# Patient Record
Sex: Female | Born: 1960
Health system: Southern US, Community
[De-identification: ages and names within clinical notes are randomized; demographics above are authoritative.]

## PROBLEM LIST (undated history)

## (undated) DIAGNOSIS — E559 Vitamin D deficiency, unspecified: Secondary | ICD-10-CM

## (undated) DIAGNOSIS — K648 Other hemorrhoids: Secondary | ICD-10-CM

## (undated) DIAGNOSIS — M722 Plantar fascial fibromatosis: Secondary | ICD-10-CM

## (undated) DIAGNOSIS — M889 Osteitis deformans of unspecified bone: Secondary | ICD-10-CM

## (undated) DIAGNOSIS — G8929 Other chronic pain: Secondary | ICD-10-CM

## (undated) DIAGNOSIS — G4730 Sleep apnea, unspecified: Secondary | ICD-10-CM

## (undated) DIAGNOSIS — I519 Heart disease, unspecified: Secondary | ICD-10-CM

## (undated) DIAGNOSIS — R7303 Prediabetes: Secondary | ICD-10-CM

## (undated) DIAGNOSIS — E739 Lactose intolerance, unspecified: Secondary | ICD-10-CM

## (undated) DIAGNOSIS — F32A Depression, unspecified: Secondary | ICD-10-CM

## (undated) DIAGNOSIS — K219 Gastro-esophageal reflux disease without esophagitis: Secondary | ICD-10-CM

## (undated) DIAGNOSIS — M25562 Pain in left knee: Secondary | ICD-10-CM

## (undated) DIAGNOSIS — M549 Dorsalgia, unspecified: Secondary | ICD-10-CM

## (undated) DIAGNOSIS — I10 Essential (primary) hypertension: Secondary | ICD-10-CM

## (undated) DIAGNOSIS — Z860100 Personal history of colon polyps, unspecified: Secondary | ICD-10-CM

## (undated) DIAGNOSIS — M5030 Other cervical disc degeneration, unspecified cervical region: Secondary | ICD-10-CM

## (undated) DIAGNOSIS — M329 Systemic lupus erythematosus, unspecified: Secondary | ICD-10-CM

## (undated) DIAGNOSIS — A6000 Herpesviral infection of urogenital system, unspecified: Secondary | ICD-10-CM

## (undated) DIAGNOSIS — M170 Bilateral primary osteoarthritis of knee: Secondary | ICD-10-CM

## (undated) DIAGNOSIS — D128 Benign neoplasm of rectum: Secondary | ICD-10-CM

## (undated) DIAGNOSIS — E039 Hypothyroidism, unspecified: Secondary | ICD-10-CM

## (undated) DIAGNOSIS — I517 Cardiomegaly: Secondary | ICD-10-CM

## (undated) DIAGNOSIS — D696 Thrombocytopenia, unspecified: Secondary | ICD-10-CM

## (undated) DIAGNOSIS — T7840XA Allergy, unspecified, initial encounter: Secondary | ICD-10-CM

## (undated) DIAGNOSIS — IMO0002 Reserved for concepts with insufficient information to code with codable children: Secondary | ICD-10-CM

## (undated) DIAGNOSIS — J45909 Unspecified asthma, uncomplicated: Secondary | ICD-10-CM

## (undated) DIAGNOSIS — M25561 Pain in right knee: Secondary | ICD-10-CM

## (undated) DIAGNOSIS — F329 Major depressive disorder, single episode, unspecified: Secondary | ICD-10-CM

## (undated) DIAGNOSIS — I071 Rheumatic tricuspid insufficiency: Secondary | ICD-10-CM

## (undated) DIAGNOSIS — R011 Cardiac murmur, unspecified: Secondary | ICD-10-CM

## (undated) DIAGNOSIS — E785 Hyperlipidemia, unspecified: Secondary | ICD-10-CM

## (undated) DIAGNOSIS — D259 Leiomyoma of uterus, unspecified: Secondary | ICD-10-CM

## (undated) DIAGNOSIS — G4700 Insomnia, unspecified: Secondary | ICD-10-CM

## (undated) DIAGNOSIS — K5900 Constipation, unspecified: Secondary | ICD-10-CM

## (undated) DIAGNOSIS — N950 Postmenopausal bleeding: Secondary | ICD-10-CM

## (undated) HISTORY — DX: Insomnia, unspecified: G47.00

## (undated) HISTORY — PX: TOE SURGERY: SHX1073

## (undated) HISTORY — DX: Unspecified asthma, uncomplicated: J45.909

## (undated) HISTORY — DX: Vitamin D deficiency, unspecified: E55.9

## (undated) HISTORY — DX: Major depressive disorder, single episode, unspecified: F32.9

## (undated) HISTORY — PX: CHOLECYSTECTOMY: SHX55

## (undated) HISTORY — DX: Thrombocytopenia, unspecified: D69.6

## (undated) HISTORY — DX: Essential (primary) hypertension: I10

## (undated) HISTORY — DX: Sleep apnea, unspecified: G47.30

## (undated) HISTORY — DX: Reserved for concepts with insufficient information to code with codable children: IMO0002

## (undated) HISTORY — DX: Benign neoplasm of rectum: D12.8

## (undated) HISTORY — DX: Osteitis deformans of unspecified bone: M88.9

## (undated) HISTORY — PX: REDUCTION MAMMAPLASTY: SUR839

## (undated) HISTORY — DX: Allergy, unspecified, initial encounter: T78.40XA

## (undated) HISTORY — DX: Lactose intolerance, unspecified: E73.9

## (undated) HISTORY — DX: Cardiac murmur, unspecified: R01.1

## (undated) HISTORY — DX: Systemic lupus erythematosus, unspecified: M32.9

## (undated) HISTORY — DX: Bilateral primary osteoarthritis of knee: M17.0

## (undated) HISTORY — PX: POLYPECTOMY: SHX149

## (undated) HISTORY — PX: BREAST ENHANCEMENT SURGERY: SHX7

## (undated) HISTORY — DX: Hyperlipidemia, unspecified: E78.5

## (undated) HISTORY — DX: Dorsalgia, unspecified: M54.9

## (undated) HISTORY — DX: Morbid (severe) obesity due to excess calories: E66.01

## (undated) HISTORY — DX: Hypothyroidism, unspecified: E03.9

## (undated) HISTORY — DX: Herpesviral infection of urogenital system, unspecified: A60.00

## (undated) HISTORY — DX: Depression, unspecified: F32.A

---

## 1989-07-11 HISTORY — PX: TUBAL LIGATION: SHX77

## 1997-07-11 HISTORY — PX: BREAST REDUCTION SURGERY: SHX8

## 1997-12-04 ENCOUNTER — Ambulatory Visit (HOSPITAL_COMMUNITY): Admission: RE | Admit: 1997-12-04 | Discharge: 1997-12-04 | Payer: Self-pay | Admitting: Family Medicine

## 1999-01-04 ENCOUNTER — Other Ambulatory Visit: Admission: RE | Admit: 1999-01-04 | Discharge: 1999-01-04 | Payer: Self-pay | Admitting: Internal Medicine

## 1999-03-25 ENCOUNTER — Ambulatory Visit (HOSPITAL_COMMUNITY): Admission: RE | Admit: 1999-03-25 | Discharge: 1999-03-25 | Payer: Self-pay | Admitting: Gastroenterology

## 1999-03-25 ENCOUNTER — Encounter: Payer: Self-pay | Admitting: Gastroenterology

## 1999-07-12 HISTORY — PX: AUGMENTATION MAMMAPLASTY: SUR837

## 2001-05-25 ENCOUNTER — Other Ambulatory Visit: Admission: RE | Admit: 2001-05-25 | Discharge: 2001-05-25 | Payer: Self-pay | Admitting: Internal Medicine

## 2001-05-30 ENCOUNTER — Encounter: Payer: Self-pay | Admitting: Internal Medicine

## 2001-05-30 ENCOUNTER — Encounter: Admission: RE | Admit: 2001-05-30 | Discharge: 2001-05-30 | Payer: Self-pay | Admitting: Internal Medicine

## 2002-03-28 ENCOUNTER — Emergency Department (HOSPITAL_COMMUNITY): Admission: EM | Admit: 2002-03-28 | Discharge: 2002-03-28 | Payer: Self-pay | Admitting: Emergency Medicine

## 2002-08-20 ENCOUNTER — Encounter (HOSPITAL_BASED_OUTPATIENT_CLINIC_OR_DEPARTMENT_OTHER): Admission: RE | Admit: 2002-08-20 | Discharge: 2002-11-18 | Payer: Self-pay | Admitting: Internal Medicine

## 2004-06-18 ENCOUNTER — Encounter: Admission: RE | Admit: 2004-06-18 | Discharge: 2004-06-18 | Payer: Self-pay | Admitting: Internal Medicine

## 2004-09-29 ENCOUNTER — Encounter: Admission: RE | Admit: 2004-09-29 | Discharge: 2004-09-29 | Payer: Self-pay | Admitting: Internal Medicine

## 2005-05-03 ENCOUNTER — Encounter: Admission: RE | Admit: 2005-05-03 | Discharge: 2005-05-03 | Payer: Self-pay | Admitting: Internal Medicine

## 2005-07-19 ENCOUNTER — Encounter: Admission: RE | Admit: 2005-07-19 | Discharge: 2005-07-19 | Payer: Self-pay | Admitting: Internal Medicine

## 2005-11-19 ENCOUNTER — Emergency Department (HOSPITAL_COMMUNITY): Admission: EM | Admit: 2005-11-19 | Discharge: 2005-11-19 | Payer: Self-pay | Admitting: Family Medicine

## 2006-07-11 DIAGNOSIS — M329 Systemic lupus erythematosus, unspecified: Secondary | ICD-10-CM

## 2006-07-11 DIAGNOSIS — IMO0002 Reserved for concepts with insufficient information to code with codable children: Secondary | ICD-10-CM

## 2006-07-11 HISTORY — DX: Systemic lupus erythematosus, unspecified: M32.9

## 2006-07-11 HISTORY — DX: Reserved for concepts with insufficient information to code with codable children: IMO0002

## 2006-07-20 ENCOUNTER — Encounter: Admission: RE | Admit: 2006-07-20 | Discharge: 2006-07-20 | Payer: Self-pay

## 2006-08-22 ENCOUNTER — Other Ambulatory Visit: Admission: RE | Admit: 2006-08-22 | Discharge: 2006-08-22 | Payer: Self-pay | Admitting: Family Medicine

## 2007-08-28 ENCOUNTER — Encounter: Admission: RE | Admit: 2007-08-28 | Discharge: 2007-08-28 | Payer: Self-pay | Admitting: Family Medicine

## 2007-10-03 ENCOUNTER — Other Ambulatory Visit: Admission: RE | Admit: 2007-10-03 | Discharge: 2007-10-03 | Payer: Self-pay | Admitting: Family Medicine

## 2007-10-12 ENCOUNTER — Ambulatory Visit (HOSPITAL_COMMUNITY): Admission: RE | Admit: 2007-10-12 | Discharge: 2007-10-12 | Payer: Self-pay | Admitting: Family Medicine

## 2008-09-16 ENCOUNTER — Encounter: Admission: RE | Admit: 2008-09-16 | Discharge: 2008-09-16 | Payer: Self-pay | Admitting: Family Medicine

## 2008-10-15 ENCOUNTER — Other Ambulatory Visit: Admission: RE | Admit: 2008-10-15 | Discharge: 2008-10-15 | Payer: Self-pay | Admitting: Family Medicine

## 2009-09-18 ENCOUNTER — Encounter: Admission: RE | Admit: 2009-09-18 | Discharge: 2009-09-18 | Payer: Self-pay | Admitting: Family Medicine

## 2009-10-19 ENCOUNTER — Emergency Department (HOSPITAL_COMMUNITY): Admission: EM | Admit: 2009-10-19 | Discharge: 2009-10-19 | Payer: Self-pay | Admitting: Family Medicine

## 2009-11-20 ENCOUNTER — Other Ambulatory Visit: Admission: RE | Admit: 2009-11-20 | Discharge: 2009-11-20 | Payer: Self-pay | Admitting: Family Medicine

## 2010-02-01 ENCOUNTER — Ambulatory Visit (HOSPITAL_COMMUNITY): Admission: RE | Admit: 2010-02-01 | Discharge: 2010-02-01 | Payer: Self-pay | Admitting: Rheumatology

## 2010-02-15 ENCOUNTER — Ambulatory Visit (HOSPITAL_COMMUNITY): Admission: RE | Admit: 2010-02-15 | Discharge: 2010-02-15 | Payer: Self-pay | Admitting: Unknown Physician Specialty

## 2010-02-16 ENCOUNTER — Emergency Department (HOSPITAL_COMMUNITY): Admission: EM | Admit: 2010-02-16 | Discharge: 2010-02-16 | Payer: Self-pay | Admitting: Emergency Medicine

## 2010-06-16 ENCOUNTER — Ambulatory Visit (HOSPITAL_COMMUNITY)
Admission: RE | Admit: 2010-06-16 | Discharge: 2010-06-16 | Payer: Self-pay | Source: Home / Self Care | Attending: Rheumatology | Admitting: Rheumatology

## 2010-08-01 ENCOUNTER — Encounter: Payer: Self-pay | Admitting: Family Medicine

## 2010-09-24 LAB — DIFFERENTIAL
Eosinophils Absolute: 0 10*3/uL (ref 0.0–0.7)
Lymphocytes Relative: 4 % — ABNORMAL LOW (ref 12–46)
Lymphs Abs: 0.3 10*3/uL — ABNORMAL LOW (ref 0.7–4.0)
Neutro Abs: 7 10*3/uL (ref 1.7–7.7)
Neutrophils Relative %: 94 % — ABNORMAL HIGH (ref 43–77)

## 2010-09-24 LAB — POCT I-STAT, CHEM 8
Calcium, Ion: 0.98 mmol/L — ABNORMAL LOW (ref 1.12–1.32)
Creatinine, Ser: 0.8 mg/dL (ref 0.4–1.2)
Glucose, Bld: 121 mg/dL — ABNORMAL HIGH (ref 70–99)
HCT: 41 % (ref 36.0–46.0)
Hemoglobin: 13.9 g/dL (ref 12.0–15.0)
Potassium: 3.6 mEq/L (ref 3.5–5.1)
Sodium: 137 mEq/L (ref 135–145)
TCO2: 22 mmol/L (ref 0–100)

## 2010-09-24 LAB — CBC
HCT: 37.8 % (ref 36.0–46.0)
MCH: 28.5 pg (ref 26.0–34.0)
MCHC: 33.3 g/dL (ref 30.0–36.0)
RBC: 4.42 MIL/uL (ref 3.87–5.11)
RDW: 13.5 % (ref 11.5–15.5)
WBC: 7.4 10*3/uL (ref 4.0–10.5)

## 2010-09-24 LAB — URINALYSIS, ROUTINE W REFLEX MICROSCOPIC
Bilirubin Urine: NEGATIVE
Glucose, UA: NEGATIVE mg/dL
Nitrite: NEGATIVE
Protein, ur: NEGATIVE mg/dL
Urobilinogen, UA: 1 mg/dL (ref 0.0–1.0)
pH: 7 (ref 5.0–8.0)

## 2010-09-24 LAB — BASIC METABOLIC PANEL
CO2: 22 mEq/L (ref 19–32)
Calcium: 8.4 mg/dL (ref 8.4–10.5)
Creatinine, Ser: 0.77 mg/dL (ref 0.4–1.2)
GFR calc Af Amer: >60 mL/min (ref >60)
Sodium: 134 mEq/L — ABNORMAL LOW (ref 135–145)

## 2010-09-24 LAB — URINE MICROSCOPIC-ADD ON

## 2010-10-01 ENCOUNTER — Other Ambulatory Visit: Payer: Self-pay | Admitting: Family Medicine

## 2010-10-01 DIAGNOSIS — Z1231 Encounter for screening mammogram for malignant neoplasm of breast: Secondary | ICD-10-CM

## 2010-10-12 ENCOUNTER — Ambulatory Visit
Admission: RE | Admit: 2010-10-12 | Discharge: 2010-10-12 | Disposition: A | Discharge status: Home / Self Care | Payer: Commercial Managed Care - PPO | Source: Ambulatory Visit | Attending: Family Medicine | Admitting: Family Medicine

## 2010-10-12 DIAGNOSIS — Z1231 Encounter for screening mammogram for malignant neoplasm of breast: Secondary | ICD-10-CM

## 2010-11-26 NOTE — Consult Note (Signed)
NAME:  Michelle Huerta, Michelle Huerta                            ACCOUNT NO.:  [id number]   MEDICAL RECORD NO.:  [id number]                   PATIENT TYPE:  REC   LOCATION:  FOOT                                 FACILITY:  [facility]   PHYSICIAN:  Jonelle Sports. Sevier, M.D.              DATE OF BIRTH:  Mar 15, 1961   DATE OF CONSULTATION:  08/22/2002  DATE OF DISCHARGE:                                   CONSULTATION   HISTORY:  This 50 year old black female comes self-referred for evaluation  of pain in the right fifth toe.  The patient has a history of hammertoe  deformities and had surgery, which appears to have consisted of removal of  the proximal phalanx bilaterally on the fifth toes some five or six years  ago by Dr. Tomie China.  She has had very little trouble since that time,  until recently, when she noticed significant pain in the toe on the right,  this pain being dorsolateral in location.  She has felt some limitation of  mobility because of the pain but has had no difficulties with any other  portion of her foot.  She is here now for our evaluation and advice.   PAST MEDICAL HISTORY:  Positive only for hypertension.   ALLERGIES:  PENICILLIN.   MEDICATIONS:  She takes an unknown antihypertensive medication.   PHYSICAL EXAMINATION:  EXTREMITIES:  Examination today is limited to the  distal lower extremities.  There is no edema and no significant deformity of  either foot.  All pulses are palpable.  Skin temperatures are normal and  symmetrical.  Monofilament testing shows preservation of protective  sensation throughout.  There is light callus formation on the heels  bilaterally, as well as in the insteps (these areas in the insteps are  thought to represent where the patient's hard orthotic contacts her foot).  On the dorsolateral aspect of both fifth toes is a significant callus  formation and there is tenderness in this area on the right.  No other  apparent pathology of that right fifth toe  is noted.   DISPOSITION:  1. The patient is given instruction regarding foot care in general.  2. Her shoes are evaluated and honestly appear to be adequate in length and     width although apparently there has been some pressure on these fifth     toes.  3. The aforementioned calluses on the dorsolateral aspects of the fifth toes     bilaterally at the interphalangeal joints are sharply pared and in fact     on the right-hand side, which is where the pain is noted, there is a     significant core to this corn.  We are able to sharply evacuate most of     this, as best I can tell.  That area on the right has been treated with     15% salicylic acid and  collodion.  Trimming of the lesion on the left is     uncomplicated and there is     no such core formation there.  4. The patient will be seen one additional time in three weeks to assess the     need for any further attention to this particular corn or callus.                                               Jonelle Sports. Cheryll Cockayne, M.D.    RES/MEDQ  D:  08/22/2002  T:  08/22/2002  Job:  [id number]   cc:   Merlene Laughter. Renae Gloss, M.D.  [address]  [address]  [place]  [place] [address]  Fax: [phone]

## 2010-12-23 ENCOUNTER — Encounter: Payer: Self-pay | Admitting: Gastroenterology

## 2011-01-17 ENCOUNTER — Ambulatory Visit (AMBULATORY_SURGERY_CENTER): Payer: Commercial Managed Care - PPO | Admitting: *Deleted

## 2011-01-17 VITALS — Ht 67.0 in | Wt 261.9 lb

## 2011-01-17 DIAGNOSIS — Z8371 Family history of colonic polyps: Secondary | ICD-10-CM

## 2011-01-17 MED ORDER — PEG-KCL-NACL-NASULF-NA ASC-C 100 G PO SOLR: 1.0000 | Freq: Once | ORAL | Status: DC

## 2011-02-11 ENCOUNTER — Ambulatory Visit (AMBULATORY_SURGERY_CENTER): Payer: Commercial Managed Care - PPO | Admitting: Gastroenterology

## 2011-02-11 ENCOUNTER — Encounter: Payer: Self-pay | Admitting: Gastroenterology

## 2011-02-11 DIAGNOSIS — Z83719 Family history of colon polyps, unspecified: Secondary | ICD-10-CM

## 2011-02-11 DIAGNOSIS — D126 Benign neoplasm of colon, unspecified: Secondary | ICD-10-CM

## 2011-02-11 DIAGNOSIS — Z1211 Encounter for screening for malignant neoplasm of colon: Secondary | ICD-10-CM

## 2011-02-11 DIAGNOSIS — Z8371 Family history of colonic polyps: Secondary | ICD-10-CM

## 2011-02-11 MED ORDER — SODIUM CHLORIDE 0.9 % IV SOLN: 500.0000 mL | Freq: Once | INTRAVENOUS | Status: DC

## 2011-02-11 NOTE — Patient Instructions (Signed)
Please Refer to your blue and neon green sheets for instructions regarding diet and activity for the rest of today.  You may resume your medications as you would normally take them.

## 2011-02-14 ENCOUNTER — Telehealth: Payer: Self-pay | Admitting: *Deleted

## 2011-02-14 NOTE — Telephone Encounter (Signed)
No message left; no ID 

## 2011-02-19 ENCOUNTER — Encounter: Payer: Self-pay | Admitting: Gastroenterology

## 2011-10-17 ENCOUNTER — Other Ambulatory Visit: Payer: Self-pay | Admitting: Family Medicine

## 2011-10-17 DIAGNOSIS — Z1231 Encounter for screening mammogram for malignant neoplasm of breast: Secondary | ICD-10-CM

## 2011-11-02 ENCOUNTER — Ambulatory Visit: Payer: Commercial Managed Care - PPO

## 2011-11-28 ENCOUNTER — Ambulatory Visit
Admission: RE | Admit: 2011-11-28 | Discharge: 2011-11-28 | Disposition: A | Discharge status: Home / Self Care | Payer: 59 | Source: Ambulatory Visit | Attending: Family Medicine | Admitting: Family Medicine

## 2011-11-28 DIAGNOSIS — Z1231 Encounter for screening mammogram for malignant neoplasm of breast: Secondary | ICD-10-CM

## 2012-01-25 ENCOUNTER — Other Ambulatory Visit (HOSPITAL_COMMUNITY)
Admission: RE | Admit: 2012-01-25 | Discharge: 2012-01-25 | Disposition: A | Discharge status: Home / Self Care | Payer: 59 | Source: Ambulatory Visit | Attending: Family Medicine | Admitting: Family Medicine

## 2012-01-25 ENCOUNTER — Other Ambulatory Visit: Payer: Self-pay | Admitting: Family Medicine

## 2012-01-25 ENCOUNTER — Ambulatory Visit
Admission: RE | Admit: 2012-01-25 | Discharge: 2012-01-25 | Disposition: A | Discharge status: Home / Self Care | Payer: 59 | Source: Ambulatory Visit | Attending: Family Medicine | Admitting: Family Medicine

## 2012-01-25 DIAGNOSIS — M25569 Pain in unspecified knee: Secondary | ICD-10-CM

## 2012-01-25 DIAGNOSIS — Z113 Encounter for screening for infections with a predominantly sexual mode of transmission: Secondary | ICD-10-CM | POA: Insufficient documentation

## 2012-01-25 DIAGNOSIS — Z0000 Encounter for general adult medical examination without abnormal findings: Secondary | ICD-10-CM | POA: Insufficient documentation

## 2012-06-19 ENCOUNTER — Encounter (HOSPITAL_COMMUNITY): Payer: Self-pay | Admitting: Emergency Medicine

## 2012-06-19 ENCOUNTER — Emergency Department (HOSPITAL_COMMUNITY)
Admission: EM | Admit: 2012-06-19 | Discharge: 2012-06-19 | Disposition: A | Discharge status: Home / Self Care | Payer: 59 | Attending: Emergency Medicine | Admitting: Emergency Medicine

## 2012-06-19 ENCOUNTER — Emergency Department (HOSPITAL_COMMUNITY): Payer: 59

## 2012-06-19 DIAGNOSIS — Z862 Personal history of diseases of the blood and blood-forming organs and certain disorders involving the immune mechanism: Secondary | ICD-10-CM | POA: Insufficient documentation

## 2012-06-19 DIAGNOSIS — M79609 Pain in unspecified limb: Secondary | ICD-10-CM | POA: Insufficient documentation

## 2012-06-19 DIAGNOSIS — Z87891 Personal history of nicotine dependence: Secondary | ICD-10-CM | POA: Insufficient documentation

## 2012-06-19 DIAGNOSIS — Z8669 Personal history of other diseases of the nervous system and sense organs: Secondary | ICD-10-CM | POA: Insufficient documentation

## 2012-06-19 DIAGNOSIS — Z79899 Other long term (current) drug therapy: Secondary | ICD-10-CM | POA: Insufficient documentation

## 2012-06-19 DIAGNOSIS — J45909 Unspecified asthma, uncomplicated: Secondary | ICD-10-CM | POA: Insufficient documentation

## 2012-06-19 DIAGNOSIS — M810 Age-related osteoporosis without current pathological fracture: Secondary | ICD-10-CM | POA: Insufficient documentation

## 2012-06-19 DIAGNOSIS — I10 Essential (primary) hypertension: Secondary | ICD-10-CM | POA: Insufficient documentation

## 2012-06-19 DIAGNOSIS — Z8739 Personal history of other diseases of the musculoskeletal system and connective tissue: Secondary | ICD-10-CM | POA: Insufficient documentation

## 2012-06-19 DIAGNOSIS — M889 Osteitis deformans of unspecified bone: Secondary | ICD-10-CM | POA: Insufficient documentation

## 2012-06-19 DIAGNOSIS — R011 Cardiac murmur, unspecified: Secondary | ICD-10-CM | POA: Insufficient documentation

## 2012-06-19 DIAGNOSIS — Z8659 Personal history of other mental and behavioral disorders: Secondary | ICD-10-CM | POA: Insufficient documentation

## 2012-06-19 DIAGNOSIS — M549 Dorsalgia, unspecified: Secondary | ICD-10-CM | POA: Insufficient documentation

## 2012-06-19 DIAGNOSIS — R51 Headache: Secondary | ICD-10-CM | POA: Insufficient documentation

## 2012-06-19 LAB — URINALYSIS, ROUTINE W REFLEX MICROSCOPIC
Bilirubin Urine: NEGATIVE
Glucose, UA: NEGATIVE mg/dL
Hgb urine dipstick: NEGATIVE
Protein, ur: NEGATIVE mg/dL
Urobilinogen, UA: 1 mg/dL (ref 0.0–1.0)

## 2012-06-19 LAB — CBC WITH DIFFERENTIAL/PLATELET
Eosinophils Absolute: 0.1 10*3/uL (ref 0.0–0.7)
Eosinophils Relative: 2 % (ref 0–5)
HCT: 38.8 % (ref 36.0–46.0)
Hemoglobin: 12.5 g/dL (ref 12.0–15.0)
Lymphocytes Relative: 39 % (ref 12–46)
Lymphs Abs: 2 10*3/uL (ref 0.7–4.0)
MCH: 27 pg (ref 26.0–34.0)
MCV: 83.8 fL (ref 78.0–100.0)
Monocytes Absolute: 0.4 10*3/uL (ref 0.1–1.0)
Monocytes Relative: 7 % (ref 3–12)
Platelets: 148 10*3/uL — ABNORMAL LOW (ref 150–400)
RBC: 4.63 MIL/uL (ref 3.87–5.11)
WBC: 5.2 10*3/uL (ref 4.0–10.5)

## 2012-06-19 LAB — BASIC METABOLIC PANEL
BUN: 19 mg/dL (ref 6–23)
CO2: 27 mEq/L (ref 19–32)
Calcium: 9.6 mg/dL (ref 8.4–10.5)
Creatinine, Ser: 0.74 mg/dL (ref 0.50–1.10)
GFR calc non Af Amer: >90 mL/min (ref >90)
Glucose, Bld: 90 mg/dL (ref 70–99)

## 2012-06-19 MED ORDER — HYDROCODONE-ACETAMINOPHEN 5-325 MG PO TABS: 1.0000 | ORAL_TABLET | Freq: Four times a day (QID) | ORAL | Status: DC | PRN

## 2012-06-19 NOTE — ED Notes (Signed)
Pt has Pagents disease and has been having lower back pain that radiates down into leg- has "large spot" lower back and rt shoulder.  Head pressure started about 10 days ago and feeling weak . Took sinus benadryl this am and had a runny nose yesterday .Denies N/V/D

## 2012-06-19 NOTE — ED Provider Notes (Signed)
History    CSN: [id number] Arrival date & time 06/19/12  4098 First MD Initiated Contact with Patient 06/19/12 0845     Chief complaint: Headache  HPI Patient states she came to the emergency room primarily because of the persistent head pressure starting about 10 days ago. She also feels like she is feeling lightheaded and dizzy. The symptoms have persisted and have not improved. She thought she was having a little bit of sinus pressure in tried taking some Benadryl yesterday but that did not help. She has not had any trouble with nausea vomiting or diarrhea. She has not had any trouble with fever, slurred speech or focal weakness. She did feel like her balance was a little bit off in the last couple of days. Patient went and saw her primary Dr. and had her blood pressure medications adjusted. That did not seem to significantly help.  Patient does mention she's lower back pain that radiates down her leg but she states that is a chronic problem associated with her Paget's disease. It did not primarily bring her in to the emergency room although it does persist. Past Medical History  Diagnosis Date  . Allergy   . Asthma     no medicines  . Glaucoma   . Depression     no meds  . Heart murmur   . Hypertension   . Osteoporosis   . Paget's disease   . Lupus   . Thrombocytopenia     Past Surgical History  Procedure Date  . Tubal ligation   . Breast enhancement surgery   . Breast reduction surgery   . Toe surgery     removal of bone in each foot    Family History  Problem Relation Age of Onset  . Colon cancer Neg Hx     History  Substance Use Topics  . Smoking status: Former Games developer  . Smokeless tobacco: Not on file  . Alcohol Use: Yes     Comment: Drinks about 4 glasses of beer a week    OB History    Grav Para Term Preterm Abortions TAB SAB Ect Mult Living                  Review of Systems  All other systems reviewed and are negative.    Allergies  Reclast and  Penicillins  Home Medications   Current Outpatient Rx  Name  Route  Sig  Dispense  Refill  . ALENDRONATE SODIUM 70 MG PO TABS   Oral   Take 70 mg by mouth every 7 (seven) days. On Monday  -  Take with a full glass of water on an empty stomach.         [name] CALCIUM 600-D PO   Oral   Take 1 tablet by mouth 2 (two) times daily.           [name] HYDROXYCHLOROQUINE SULFATE 200 MG PO TABS   Oral   Take 200 mg by mouth daily.           [name] LOSARTAN POTASSIUM-HCTZ 100-12.5 MG PO TABS   Oral   Take 1 tablet by mouth daily.         [name] VITAMIN D (ERGOCALCIFEROL) 50000 UNITS PO CAPS   Oral   Take 50,000 Units by mouth every 7 (seven) days. On monday         . ZOLPIDEM TARTRATE 5 MG PO TABS   Oral   Take 5 mg by mouth at bedtime  as needed. For sleep           BP 150/98  Pulse 86  Temp 97.9 F (36.6 C) (Oral)  Resp 22  SpO2 100%  Physical Exam  Nursing note and vitals reviewed. Constitutional: She appears well-developed and well-nourished. No distress.  HENT:  Head: Normocephalic and atraumatic.  Right Ear: External ear normal.  Left Ear: External ear normal.  Eyes: Conjunctivae normal are normal. Right eye exhibits no discharge. Left eye exhibits no discharge. No scleral icterus.  Neck: Normal range of motion. Neck supple. No tracheal deviation present.       No meningismus  Cardiovascular: Normal rate, regular rhythm and intact distal pulses.   Pulmonary/Chest: Effort normal and breath sounds normal. No stridor. No respiratory distress. She has no wheezes. She has no rales.  Abdominal: Soft. Bowel sounds are normal. She exhibits no distension. There is no tenderness. There is no rebound and no guarding.  Musculoskeletal: She exhibits no edema and no tenderness.  Neurological: She is alert. She has normal strength. No sensory deficit. Cranial nerve deficit:  no gross defecits noted. She exhibits normal muscle tone. She displays no seizure activity. Coordination normal.        Nl gait, 5/5 strength UE and LE  Skin: Skin is warm and dry. No rash noted.  Psychiatric: She has a normal mood and affect.    ED Course  Procedures (including critical care time)  Labs Reviewed  CBC WITH DIFFERENTIAL - Abnormal; Notable for the following:    Platelets 148 (*)     All other components within normal limits  BASIC METABOLIC PANEL  URINALYSIS, ROUTINE W REFLEX MICROSCOPIC   Ct Head Wo Contrast  06/19/2012  *RADIOLOGY REPORT*  Clinical Data: Progressive frontal headache for 10 days, global weakness, near-syncopal episode, history of Paget's disease  CT HEAD WITHOUT CONTRAST  Technique:  Contiguous axial images were obtained from the base of the skull through the vertex without contrast.  Comparison: Head CT - 09/29/2004; bone scan - 02/01/2010  Findings:  Wallace Cullens white differentiation is maintained.  No CT evidence of acute large territory infarct.  No intraparenchymal or extra-axial mass or hemorrhage.  Normal size and configuration of the ventricles and basilar cisterns.  No midline shift.  Limited visualization of the paranasal sinuses and mastoid air cells are normal.  Regional soft tissues are normal.  No displaced calvarial fracture.  IMPRESSION: Negative noncontrast head CT.   Original Report Authenticated By: Tacey Ruiz, MD      MDM  Patient presents with complaints of headache. She does have some back pain as well but this was not her primary issue for coming to the emergency department. Her evaluation and exam emergent apartment a reassuring. I doubt significant pathology such as subarachnoid hemorrhage, stroke, or meningitis. I will discharge the patient home on medications to take for symptomatic relief. I recommend she followup with her primary Dr. for further evaluation if the symptoms persist        Celene Kras, MD 06/19/12 1110

## 2012-07-05 ENCOUNTER — Other Ambulatory Visit (HOSPITAL_COMMUNITY): Payer: Self-pay | Admitting: Family Medicine

## 2012-07-05 DIAGNOSIS — R42 Dizziness and giddiness: Secondary | ICD-10-CM

## 2012-07-05 DIAGNOSIS — Q232 Congenital mitral stenosis: Secondary | ICD-10-CM

## 2012-07-06 ENCOUNTER — Ambulatory Visit (HOSPITAL_COMMUNITY)
Admission: RE | Admit: 2012-07-06 | Discharge: 2012-07-06 | Disposition: A | Discharge status: Home / Self Care | Payer: 59 | Source: Ambulatory Visit | Attending: Family Medicine | Admitting: Family Medicine

## 2012-07-06 ENCOUNTER — Other Ambulatory Visit (HOSPITAL_COMMUNITY): Payer: Self-pay | Admitting: Family Medicine

## 2012-07-06 DIAGNOSIS — R42 Dizziness and giddiness: Secondary | ICD-10-CM

## 2012-07-06 DIAGNOSIS — Q232 Congenital mitral stenosis: Secondary | ICD-10-CM

## 2012-07-06 MED ORDER — GADOBENATE DIMEGLUMINE 529 MG/ML IV SOLN
20.0000 mL | Freq: Once | INTRAVENOUS | Status: AC | PRN
  Administered 2012-07-06: 20 mL via INTRAVENOUS

## 2012-12-31 ENCOUNTER — Other Ambulatory Visit: Payer: Self-pay

## 2012-12-31 DIAGNOSIS — Z1231 Encounter for screening mammogram for malignant neoplasm of breast: Secondary | ICD-10-CM

## 2013-04-02 ENCOUNTER — Ambulatory Visit
Admission: RE | Admit: 2013-04-02 | Discharge: 2013-04-02 | Disposition: A | Discharge status: Home / Self Care | Payer: 59 | Source: Ambulatory Visit

## 2013-04-02 DIAGNOSIS — Z1231 Encounter for screening mammogram for malignant neoplasm of breast: Secondary | ICD-10-CM

## 2013-05-01 ENCOUNTER — Emergency Department (HOSPITAL_COMMUNITY): Payer: 59

## 2013-05-01 ENCOUNTER — Emergency Department (HOSPITAL_COMMUNITY)
Admission: EM | Admit: 2013-05-01 | Discharge: 2013-05-01 | Disposition: A | Discharge status: Home / Self Care | Payer: 59 | Attending: Emergency Medicine | Admitting: Emergency Medicine

## 2013-05-01 ENCOUNTER — Encounter (HOSPITAL_COMMUNITY): Payer: Self-pay | Admitting: Emergency Medicine

## 2013-05-01 DIAGNOSIS — M5030 Other cervical disc degeneration, unspecified cervical region: Secondary | ICD-10-CM | POA: Insufficient documentation

## 2013-05-01 DIAGNOSIS — M810 Age-related osteoporosis without current pathological fracture: Secondary | ICD-10-CM | POA: Insufficient documentation

## 2013-05-01 DIAGNOSIS — D696 Thrombocytopenia, unspecified: Secondary | ICD-10-CM | POA: Insufficient documentation

## 2013-05-01 DIAGNOSIS — Z87891 Personal history of nicotine dependence: Secondary | ICD-10-CM | POA: Insufficient documentation

## 2013-05-01 DIAGNOSIS — M889 Osteitis deformans of unspecified bone: Secondary | ICD-10-CM | POA: Insufficient documentation

## 2013-05-01 DIAGNOSIS — M25569 Pain in unspecified knee: Secondary | ICD-10-CM | POA: Insufficient documentation

## 2013-05-01 DIAGNOSIS — Z9109 Other allergy status, other than to drugs and biological substances: Secondary | ICD-10-CM | POA: Insufficient documentation

## 2013-05-01 DIAGNOSIS — Z880 Allergy status to penicillin: Secondary | ICD-10-CM | POA: Insufficient documentation

## 2013-05-01 DIAGNOSIS — Z79899 Other long term (current) drug therapy: Secondary | ICD-10-CM | POA: Insufficient documentation

## 2013-05-01 DIAGNOSIS — J45909 Unspecified asthma, uncomplicated: Secondary | ICD-10-CM | POA: Insufficient documentation

## 2013-05-01 DIAGNOSIS — F3289 Other specified depressive episodes: Secondary | ICD-10-CM | POA: Insufficient documentation

## 2013-05-01 DIAGNOSIS — Z888 Allergy status to other drugs, medicaments and biological substances status: Secondary | ICD-10-CM | POA: Insufficient documentation

## 2013-05-01 DIAGNOSIS — F329 Major depressive disorder, single episode, unspecified: Secondary | ICD-10-CM | POA: Insufficient documentation

## 2013-05-01 DIAGNOSIS — M329 Systemic lupus erythematosus, unspecified: Secondary | ICD-10-CM | POA: Insufficient documentation

## 2013-05-01 DIAGNOSIS — I10 Essential (primary) hypertension: Secondary | ICD-10-CM | POA: Insufficient documentation

## 2013-05-01 DIAGNOSIS — R011 Cardiac murmur, unspecified: Secondary | ICD-10-CM | POA: Insufficient documentation

## 2013-05-01 DIAGNOSIS — M549 Dorsalgia, unspecified: Secondary | ICD-10-CM | POA: Insufficient documentation

## 2013-05-01 DIAGNOSIS — R51 Headache: Secondary | ICD-10-CM | POA: Insufficient documentation

## 2013-05-01 DIAGNOSIS — H409 Unspecified glaucoma: Secondary | ICD-10-CM | POA: Insufficient documentation

## 2013-05-01 DIAGNOSIS — S139XXA Sprain of joints and ligaments of unspecified parts of neck, initial encounter: Secondary | ICD-10-CM | POA: Insufficient documentation

## 2013-05-01 DIAGNOSIS — Y9389 Activity, other specified: Secondary | ICD-10-CM | POA: Insufficient documentation

## 2013-05-01 DIAGNOSIS — G8929 Other chronic pain: Secondary | ICD-10-CM | POA: Insufficient documentation

## 2013-05-01 DIAGNOSIS — T148XXA Other injury of unspecified body region, initial encounter: Secondary | ICD-10-CM

## 2013-05-01 DIAGNOSIS — Y92410 Unspecified street and highway as the place of occurrence of the external cause: Secondary | ICD-10-CM | POA: Insufficient documentation

## 2013-05-01 HISTORY — DX: Dorsalgia, unspecified: M54.9

## 2013-05-01 HISTORY — DX: Pain in left knee: M25.562

## 2013-05-01 HISTORY — DX: Other chronic pain: G89.29

## 2013-05-01 HISTORY — DX: Pain in right knee: M25.561

## 2013-05-01 HISTORY — DX: Other cervical disc degeneration, unspecified cervical region: M50.30

## 2013-05-01 MED ORDER — NAPROXEN 250 MG PO TABS: 250.0000 mg | ORAL_TABLET | Freq: Two times a day (BID) | ORAL | Status: DC

## 2013-05-01 MED ORDER — HYDROCODONE-ACETAMINOPHEN 5-325 MG PO TABS: ORAL_TABLET | ORAL | Status: DC

## 2013-05-01 MED ORDER — BENZONATATE 100 MG PO CAPS: 100.0000 mg | ORAL_CAPSULE | Freq: Three times a day (TID) | ORAL | Status: DC | PRN

## 2013-05-01 MED ORDER — ACETAMINOPHEN 500 MG PO TABS
1000.0000 mg | ORAL_TABLET | Freq: Once | ORAL | Status: AC
  Administered 2013-05-01: 1000 mg via ORAL
  Filled 2013-05-01: qty 2

## 2013-05-01 MED ORDER — METHOCARBAMOL 500 MG PO TABS: 1000.0000 mg | ORAL_TABLET | Freq: Four times a day (QID) | ORAL | Status: DC | PRN

## 2013-05-01 MED ORDER — IBUPROFEN 200 MG PO TABS
400.0000 mg | ORAL_TABLET | Freq: Once | ORAL | Status: AC
  Administered 2013-05-01: 400 mg via ORAL
  Filled 2013-05-01: qty 2
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
## 2013-05-01 NOTE — ED Notes (Signed)
Pt returned from radiology.
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
## 2013-09-19 DIAGNOSIS — I10 Essential (primary) hypertension: Secondary | ICD-10-CM | POA: Insufficient documentation

## 2013-09-19 DIAGNOSIS — F329 Major depressive disorder, single episode, unspecified: Secondary | ICD-10-CM | POA: Insufficient documentation

## 2013-09-19 HISTORY — DX: Essential (primary) hypertension: I10

## 2013-12-25 ENCOUNTER — Other Ambulatory Visit: Payer: Self-pay | Admitting: *Deleted

## 2013-12-25 ENCOUNTER — Encounter (INDEPENDENT_AMBULATORY_CARE_PROVIDER_SITE_OTHER): Payer: Self-pay

## 2013-12-25 ENCOUNTER — Ambulatory Visit
Admission: RE | Admit: 2013-12-25 | Discharge: 2013-12-25 | Disposition: A | Discharge status: Home / Self Care | Payer: 59 | Source: Ambulatory Visit | Attending: *Deleted | Admitting: *Deleted

## 2013-12-25 DIAGNOSIS — M5430 Sciatica, unspecified side: Secondary | ICD-10-CM

## 2013-12-25 DIAGNOSIS — M79606 Pain in leg, unspecified: Secondary | ICD-10-CM

## 2014-01-06 ENCOUNTER — Other Ambulatory Visit: Payer: Self-pay | Admitting: Family Medicine

## 2014-01-06 ENCOUNTER — Ambulatory Visit
Admission: RE | Admit: 2014-01-06 | Discharge: 2014-01-06 | Disposition: A | Discharge status: Home / Self Care | Payer: 59 | Source: Ambulatory Visit | Attending: Family Medicine | Admitting: Family Medicine

## 2014-01-06 DIAGNOSIS — R0602 Shortness of breath: Secondary | ICD-10-CM

## 2014-03-14 ENCOUNTER — Other Ambulatory Visit: Payer: Self-pay | Admitting: Family Medicine

## 2014-03-14 ENCOUNTER — Other Ambulatory Visit (HOSPITAL_COMMUNITY)
Admission: RE | Admit: 2014-03-14 | Discharge: 2014-03-14 | Disposition: A | Discharge status: Home / Self Care | Payer: 59 | Source: Ambulatory Visit | Attending: Family Medicine | Admitting: Family Medicine

## 2014-03-14 DIAGNOSIS — Z124 Encounter for screening for malignant neoplasm of cervix: Secondary | ICD-10-CM | POA: Insufficient documentation

## 2014-03-18 LAB — CYTOLOGY - PAP

## 2014-04-08 ENCOUNTER — Other Ambulatory Visit: Payer: Self-pay

## 2014-04-08 DIAGNOSIS — Z1231 Encounter for screening mammogram for malignant neoplasm of breast: Secondary | ICD-10-CM

## 2014-04-10 ENCOUNTER — Inpatient Hospital Stay: Admission: RE | Admit: 2014-04-10 | Payer: 59 | Source: Ambulatory Visit

## 2014-04-15 ENCOUNTER — Ambulatory Visit
Admission: RE | Admit: 2014-04-15 | Discharge: 2014-04-15 | Disposition: A | Discharge status: Home / Self Care | Payer: 59 | Source: Ambulatory Visit

## 2014-04-15 DIAGNOSIS — Z1231 Encounter for screening mammogram for malignant neoplasm of breast: Secondary | ICD-10-CM

## 2014-06-25 ENCOUNTER — Other Ambulatory Visit: Payer: Self-pay | Admitting: Internal Medicine

## 2014-09-10 ENCOUNTER — Ambulatory Visit (INDEPENDENT_AMBULATORY_CARE_PROVIDER_SITE_OTHER): Payer: 59 | Admitting: Podiatry

## 2014-09-10 ENCOUNTER — Encounter: Payer: Self-pay | Admitting: Podiatry

## 2014-09-10 VITALS — BP 112/69 | HR 86 | Resp 12

## 2014-09-10 DIAGNOSIS — L600 Ingrowing nail: Secondary | ICD-10-CM

## 2014-09-10 NOTE — Progress Notes (Signed)
   Subjective:    Patient ID: Michelle Huerta, female    DOB: 1961/06/21, 54 y.o.   MRN: [id number]  HPI  PT STATED B/L GREAT TOENAIL BEEN SORE FOR 1 MONTH. THE TOENAILS ARE GETTING WORSE AND HAVE THROBBING PAIN AND GET AGGRAVATED BY PUTTING PRESSURE. TRIED TO KEEP THEM TRIM.  Review of Systems  All other systems reviewed and are negative.      Objective:   Physical Exam        Assessment & Plan:

## 2014-09-10 NOTE — Patient Instructions (Signed)

## 2014-09-11 NOTE — Progress Notes (Signed)
Subjective:     Patient ID: Michelle Huerta, female   DOB: [birth year], 54 y.o.   MRN: [id number]  HPI patient presents with bilateral ingrown toenail deformities of the big toes both feet medial border with looseness of the right hallux nail and probable trauma of which she's not sure. States it's been going on for a long time and is worsened recently    Review of Systems  All other systems reviewed and are negative.      Objective:   Physical Exam  Constitutional: She is oriented to person, place, and time.  Cardiovascular: Intact distal pulses.   Musculoskeletal: Normal range of motion.  Neurological: She is oriented to person, place, and time.  Skin: Skin is warm.  Nursing note and vitals reviewed.  neurovascular status intact with muscle strength adequate and range of motion subtalar midtarsal joint within normal limits. Patient is noted to have good digital perfusion is well oriented 3 with a thick hallux nail right and incurvated medial borders of the hallux bilateral that are painful when pressed and there is crusted tissue underneath the right big toenail     Assessment:     Ingrown toenail deformity right hallux with probable trauma to the underlying bed and ingrowing toenail left hallux    Plan:     Reviewed both conditions and at this time recommended removal of the corners and cleaning up of the underlying nail bed. Explained that she may lose the rest of the right nail and that there is no guarantee this will cure all discomfort she is experiencing. She wants procedure and today understands risk and I infiltrated each hallux 60 mg Xylocaine Marcaine mixture removed the medial borders and then cleaned up the underlying bed noting that the right hallux nail is moderately loose but it was intact proximal. I exposed the matrix and applied phenol 3 applications 30 seconds to each nailbed border and then applied alcohol and sterile dressings to each big toe. Patient tolerated procedure  well

## 2014-09-15 ENCOUNTER — Ambulatory Visit: Payer: 59 | Admitting: Podiatry

## 2014-09-30 ENCOUNTER — Encounter: Payer: Self-pay | Admitting: Podiatry

## 2014-09-30 ENCOUNTER — Ambulatory Visit (INDEPENDENT_AMBULATORY_CARE_PROVIDER_SITE_OTHER): Payer: 59 | Admitting: Podiatry

## 2014-09-30 DIAGNOSIS — L600 Ingrowing nail: Secondary | ICD-10-CM

## 2014-09-30 NOTE — Patient Instructions (Signed)
Epsom salt soak, 1/4 cup plain epsom salt to 2 quarts warm water for 20 minutes daily

## 2014-10-01 NOTE — Progress Notes (Signed)
Subjective:     Patient ID: Michelle Huerta, female   DOB: [birth year], 54 y.o.   MRN: [id number]  HPI patient presents with incurvated nail border right hallux that she just wanted to have checked   Review of Systems     Objective:   Physical Exam Neurovascular status and checked with the nail border healing well and mild lifting of the remainder of the big toe    Assessment:     Damage to the big toe nail which may be lost in the long run    Plan:     Explained to the patient condition and at this point I recommended soaks and if it were to fall off a new nail will regrow. Reappoint if damage occurs

## 2015-03-10 ENCOUNTER — Other Ambulatory Visit: Payer: Self-pay

## 2015-03-10 DIAGNOSIS — Z1231 Encounter for screening mammogram for malignant neoplasm of breast: Secondary | ICD-10-CM

## 2015-04-06 ENCOUNTER — Ambulatory Visit (INDEPENDENT_AMBULATORY_CARE_PROVIDER_SITE_OTHER): Payer: 59 | Admitting: Obstetrics & Gynecology

## 2015-04-06 ENCOUNTER — Encounter: Payer: Self-pay | Admitting: Obstetrics & Gynecology

## 2015-04-06 VITALS — BP 132/69 | HR 82 | Temp 98.2°F | Ht 67.0 in | Wt 292.8 lb

## 2015-04-06 DIAGNOSIS — Z113 Encounter for screening for infections with a predominantly sexual mode of transmission: Secondary | ICD-10-CM | POA: Diagnosis not present

## 2015-04-06 DIAGNOSIS — D251 Intramural leiomyoma of uterus: Secondary | ICD-10-CM

## 2015-04-06 DIAGNOSIS — Z0000 Encounter for general adult medical examination without abnormal findings: Secondary | ICD-10-CM

## 2015-04-06 DIAGNOSIS — Z124 Encounter for screening for malignant neoplasm of cervix: Secondary | ICD-10-CM | POA: Diagnosis not present

## 2015-04-06 DIAGNOSIS — Z1151 Encounter for screening for human papillomavirus (HPV): Secondary | ICD-10-CM | POA: Diagnosis not present

## 2015-04-06 LAB — TSH: TSH: 1.738 u[IU]/mL (ref 0.350–4.500)

## 2015-04-06 NOTE — Progress Notes (Signed)
   Subjective:    Patient ID: Michelle Huerta, female    DOB: 04-05-1961, 54 y.o.   MRN: [id number]  HPI  54 yo S [id number] ( 69 and 64 yo kids, 8 grands) referred here by her cousin who is one of my patients. She is here with the issue of fibroids. She would like to find out if they have enlarged. She has occasional LBP but she also has Paget's disease. Her back pain has eased up recently after she started treatment.   From a convoluted discussion it seems like she had some Huezo spotting about 8-9 months ago. She thinks that prior to that her last vaginal bleeding had been about a year or so prior to that.  Review of Systems She works for [employer] in housekeeping. She had her flu vaccine last week. Last mammogram 10/15. It is scheduled for next month. She has been monogamous for about 10 years, lives by herself.    Objective:   Physical Exam [id number] Breathing, conversing, and ambulating normally Abd- benign       Assessment & Plan:  Fibroids, ? PMB- schedule gyn u/s

## 2015-04-07 LAB — CYTOLOGY - PAP

## 2015-04-07 LAB — HIV ANTIBODY (ROUTINE TESTING W REFLEX): HIV: NONREACTIVE

## 2015-04-07 LAB — URINE CYTOLOGY ANCILLARY ONLY
CHLAMYDIA, DNA PROBE: NEGATIVE
Neisseria Gonorrhea: NEGATIVE

## 2015-04-07 LAB — RPR

## 2015-04-07 LAB — HEPATITIS C ANTIBODY: HCV AB: NEGATIVE

## 2015-04-07 LAB — HEPATITIS B SURFACE ANTIGEN: HEP B S AG: NEGATIVE

## 2015-04-13 ENCOUNTER — Ambulatory Visit (HOSPITAL_COMMUNITY)
Admission: RE | Admit: 2015-04-13 | Discharge: 2015-04-13 | Disposition: A | Discharge status: Home / Self Care | Payer: 59 | Source: Ambulatory Visit | Attending: Obstetrics & Gynecology | Admitting: Obstetrics & Gynecology

## 2015-04-13 DIAGNOSIS — N950 Postmenopausal bleeding: Secondary | ICD-10-CM | POA: Insufficient documentation

## 2015-04-13 DIAGNOSIS — D259 Leiomyoma of uterus, unspecified: Secondary | ICD-10-CM | POA: Insufficient documentation

## 2015-04-13 DIAGNOSIS — D251 Intramural leiomyoma of uterus: Secondary | ICD-10-CM

## 2015-04-21 ENCOUNTER — Telehealth: Payer: Self-pay | Admitting: General Practice

## 2015-04-21 NOTE — Telephone Encounter (Signed)
Opened in error

## 2015-04-22 ENCOUNTER — Encounter: Payer: Self-pay | Admitting: Obstetrics & Gynecology

## 2015-04-22 ENCOUNTER — Ambulatory Visit
Admission: RE | Admit: 2015-04-22 | Discharge: 2015-04-22 | Disposition: A | Discharge status: Home / Self Care | Payer: 59 | Source: Ambulatory Visit

## 2015-04-22 ENCOUNTER — Ambulatory Visit (INDEPENDENT_AMBULATORY_CARE_PROVIDER_SITE_OTHER): Payer: 59 | Admitting: Obstetrics & Gynecology

## 2015-04-22 VITALS — BP 103/67 | HR 86 | Wt 302.0 lb

## 2015-04-22 DIAGNOSIS — N950 Postmenopausal bleeding: Secondary | ICD-10-CM

## 2015-04-22 DIAGNOSIS — Z1231 Encounter for screening mammogram for malignant neoplasm of breast: Secondary | ICD-10-CM

## 2015-04-22 MED ORDER — MISOPROSTOL 200 MCG PO TABS: ORAL_TABLET | ORAL | Status: DC

## 2015-04-22 NOTE — Progress Notes (Signed)
   Subjective:    Patient ID: Michelle Huerta, female    DOB: Aug 24, 1960, 54 y.o.   MRN: [id number]  HPI  54 yo with PMB. Her u/s showed large fibroids and an indescreet endometrium.  Review of Systems     Objective:   Physical Exam [id number] Breathing, conversing, and ambulating normally     Assessment & Plan:  PMB- plan for d&c

## 2015-04-23 ENCOUNTER — Encounter (HOSPITAL_COMMUNITY): Payer: Self-pay | Admitting: *Deleted

## 2015-05-15 ENCOUNTER — Telehealth: Payer: Self-pay | Admitting: Cardiovascular Disease

## 2015-05-15 NOTE — Telephone Encounter (Signed)
Received records from [place] for appointment with Dr Oval Linsey on 05/29/15.  Records given to [facility] (medical records) for Dr Blenda Mounts schedule on 05/29/15. lp

## 2015-05-29 ENCOUNTER — Ambulatory Visit: Payer: 59 | Admitting: Cardiovascular Disease

## 2015-06-10 NOTE — Progress Notes (Signed)
Cardiology Office Note   Date:  06/11/2015   ID:  KERIE HOCKLEY, DOB 04/27/1961, MRN [id number]  PCP:  Vidal Schwalbe, MD  Cardiologist:   Sharol Harness, MD   Chief Complaint  Patient presents with  . New Evaluation    clearance to start Belviq//pt c/o SOB when she lays down, happens about once a month//pt sttaes no other Sx.    History of Present Illness: Michelle Huerta is a 54 y.o. female with hypertension, Lupus, and Paget's disease who presents for evaluation prior to starting Belviq.  Michelle Huerta plans to start Belviq has a weight loss aid.  She has not been exercising and admits that her diet is poor. She contacted her physician regarding starting [place].  Her PCP, Dr. Dema Severin, recommended that she see a cardiologist prior to initiating this medication. Overall Michelle Huerta has been feeling well. She denies any chest pain, shortness of breath, lightheadedness, dizziness, lower extremity edema, orthopnea or PND. She does note that sometimes when she is sleeping she feels like she can't catch her breath but this is very sporadic. It does awaken her from sleep at times. She is unsure if she snores but thinks that she may. She is not able to rest well at night and often can't get to sleep. She's been using trazodone which does help somewhat.  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   She is able to perform her duties at work without difficulty.   Her mother has a pacemaker but otherwise does not have any cardiac disease in her family.  She quit smoking in 2010 with the aid of Chantix.   Past Medical History  Diagnosis Date  . Allergy   . Asthma     no medicines  . Glaucoma   . Depression     no meds  . Heart murmur   . Hypertension   . Osteoporosis   . Paget's disease   . Lupus ([place])   . Thrombocytopenia ([place])   . Chronic back pain   . Bilateral chronic knee pain   . DDD (degenerative disc disease), cervical   . Morbid obesity ([place]) 06/11/2015    Past Surgical History   Procedure Laterality Date  . Tubal ligation    . Breast enhancement surgery    . Breast reduction surgery    . Toe surgery      removal of bone in each foot     Current Outpatient Prescriptions  Medication Sig Dispense Refill  . acetaminophen (TYLENOL) 325 MG tablet Take 650 mg by mouth every 6 (six) hours as needed for mild pain.     [name] albuterol (PROVENTIL HFA;VENTOLIN HFA) 108 (90 BASE) MCG/ACT inhaler Inhale 2 puffs into the lungs every 6 (six) hours as needed for wheezing or shortness of breath. Additional refills from primary care physician 18 g 0  . diclofenac (VOLTAREN) 75 MG EC tablet Take 75 mg by mouth daily.    . misoprostol (CYTOTEC) 200 MCG tablet Take 3 pills by mouth the night before d and c. 3 tablet 0  . valACYclovir (VALTREX) 500 MG tablet Take 500 mg by mouth daily.    . valsartan-hydrochlorothiazide (DIOVAN-HCT) 320-25 MG per tablet Take 1 tablet by mouth daily.    [name] alendronate (FOSAMAX) 70 MG tablet Take 70 mg by mouth every 7 (seven) days. On Monday  -  Take with a full glass of water on an empty stomach.    . hydroxychloroquine (PLAQUENIL) 200  MG tablet Take 200 mg by mouth daily.      No current facility-administered medications for this visit.    Allergies:   Reclast and Penicillins    Social History:  The patient  reports that she has quit smoking. She does not have any smokeless tobacco history on file. She reports that she drinks alcohol. She reports that she does not use illicit drugs.   Family History:  The patient's family history is negative for Colon cancer.    ROS:  Please see the history of present illness.   Otherwise, review of systems are positive for none.   All other systems are reviewed and negative.    PHYSICAL EXAM: VS:  BP 116/88 mmHg  Pulse 83  Ht 5\' 7"  (1.702 m)  Wt 131.861 kg (290 lb 11.2 oz)  BMI 45.52 kg/m2 , BMI Body mass index is 45.52 kg/(m^2). GENERAL:  Well appearing HEENT:  Pupils equal round and reactive, fundi not  visualized, oral mucosa unremarkable NECK:  No jugular venous distention, waveform within normal limits, carotid upstroke brisk and symmetric, no bruits, no thyromegaly LYMPHATICS:  No cervical adenopathy LUNGS:  Clear to auscultation bilaterally HEART:  RRR.  PMI not displaced or sustained,S1 and S2 within normal limits, no S3, no S4, no clicks, no rubs, no murmurs ABD:  Flat, positive bowel sounds normal in frequency in pitch, no bruits, no rebound, no guarding, no midline pulsatile mass, no hepatomegaly, no splenomegaly.  Obese EXT:  2 plus pulses throughout, no edema, no cyanosis no clubbing SKIN:  No rashes no nodules NEURO:  Cranial nerves II through XII grossly intact, motor grossly intact throughout PSYCH:  Cognitively intact, oriented to person place and time    EKG:  EKG is ordered today. The ekg ordered today demonstrates sinus rhythm rate 83 bpm. Early R wave progression.   Recent Labs: 04/06/2015: TSH 1.738    Lipid Panel No results found for: CHOL, TRIG, HDL, CHOLHDL, VLDL, LDLCALC, LDLDIRECT 03/25/15: WBC 5.5, hematocrit 36.7, platelets 138 Sodium 141, potassium 3.7, BUN 15, creatinine 0.79  AST 17 ALT 18 TSH 1.33 Total cholesterol 150, triglycerides 95, HDL 70, LDL61  Wt Readings from Last 3 Encounters:  06/11/15 131.861 kg (290 lb 11.2 oz)  04/22/15 136.986 kg (302 lb)  04/06/15 132.813 kg (292 lb 12.8 oz)      ASSESSMENT AND PLAN:  # Obesity: Michelle Huerta and I discussed the fact that the best option for weight loss is typically improving her diet and increasing physical activity. She is not currently getting any exercise outside of work.  Weight loss medications, including Belviq, have been associated with valvular heart disease and pulmonary hypertension. We discussed these concerns and she will consider whether or not she wants to use this medication.  We will obtain an echo to evaluate for valvular heart disease and pulmonary hypertension.  Given her report of  feeling like she can't breathe when she is asleep not concerned that she may have obstructive sleep apnea.  EKG shows possible evidence of pulmonary hypertension.    # OSA screening: Michelle Huerta reports awakening feeling as though she cannot breathe. She is unsure whether she snores but does endorse daytime somnolence and difficulty sleeping at night. We will refer her for a sleep study to evaluate for obstructive sleep apnea.  # Hypertension: Blood pressure well-controlled on valsartan/hydrochlorothiazide.  Current medicines are reviewed at length with the patient today.  The patient does not have concerns regarding medicines.  The following changes  have been made:  no change  Labs/ tests ordered today include:   Orders Placed This Encounter  Procedures  . EKG 12-Lead  . ECHOCARDIOGRAM COMPLETE  . Split night study     Disposition:   FU with Sahirah Rudell C. Oval Linsey, MD, [facility] as needed   Signed, [place] Oval Linsey, MD, [facility]  06/11/2015 2:44 PM    [facility]

## 2015-06-11 ENCOUNTER — Encounter: Payer: Self-pay | Admitting: Cardiovascular Disease

## 2015-06-11 ENCOUNTER — Ambulatory Visit (INDEPENDENT_AMBULATORY_CARE_PROVIDER_SITE_OTHER): Payer: 59 | Admitting: Cardiovascular Disease

## 2015-06-11 VITALS — BP 116/88 | HR 83 | Ht 67.0 in | Wt 290.7 lb

## 2015-06-11 DIAGNOSIS — R9431 Abnormal electrocardiogram [ECG] [EKG]: Secondary | ICD-10-CM | POA: Diagnosis not present

## 2015-06-11 DIAGNOSIS — M889 Osteitis deformans of unspecified bone: Secondary | ICD-10-CM | POA: Insufficient documentation

## 2015-06-11 DIAGNOSIS — L930 Discoid lupus erythematosus: Secondary | ICD-10-CM | POA: Insufficient documentation

## 2015-06-11 DIAGNOSIS — R0683 Snoring: Secondary | ICD-10-CM

## 2015-06-11 DIAGNOSIS — I10 Essential (primary) hypertension: Secondary | ICD-10-CM | POA: Insufficient documentation

## 2015-06-11 HISTORY — DX: Morbid (severe) obesity due to excess calories: E66.01

## 2015-06-11 MED ORDER — ALBUTEROL SULFATE HFA 108 (90 BASE) MCG/ACT IN AERS: 2.0000 | INHALATION_SPRAY | Freq: Four times a day (QID) | RESPIRATORY_TRACT | Status: DC | PRN

## 2015-06-11 NOTE — Patient Instructions (Signed)
Medication Instructions:  Your physician recommends that you continue on your current medications as directed. Please refer to the Current Medication list given to you today.  Labwork: none  Testing/Procedures: Your physician has requested that you have an echocardiogram. Echocardiography is a painless test that uses sound waves to create images of your heart. It provides your doctor with information about the size and shape of your heart and how well your heart's chambers and valves are working. This procedure takes approximately one hour. There are no restrictions for this procedure.  Your physician has recommended that you have a sleep study. This test records several body functions during sleep, including: brain activity, eye movement, oxygen and carbon dioxide blood levels, heart rate and rhythm, breathing rate and rhythm, the flow of air through your mouth and nose, snoring, body muscle movements, and chest and belly movement.  Follow-Up: As needed  If you need a refill on your cardiac medications before your next appointment, please call your pharmacy.

## 2015-06-23 MED ORDER — DEXTROSE 5 % IV SOLN
200.0000 mg | INTRAVENOUS | Status: AC
  Administered 2015-06-24: 200 mg via INTRAVENOUS
  Filled 2015-06-23: qty 200

## 2015-06-24 ENCOUNTER — Ambulatory Visit (HOSPITAL_COMMUNITY): Payer: 59 | Admitting: Certified Registered Nurse Anesthetist

## 2015-06-24 ENCOUNTER — Encounter (HOSPITAL_COMMUNITY)
Admission: RE | Disposition: A | Discharge status: Home / Self Care | Payer: Self-pay | Source: Ambulatory Visit | Attending: Obstetrics & Gynecology

## 2015-06-24 ENCOUNTER — Ambulatory Visit (HOSPITAL_COMMUNITY)
Admission: RE | Admit: 2015-06-24 | Discharge: 2015-06-24 | Disposition: A | Discharge status: Home / Self Care | Payer: 59 | Source: Ambulatory Visit | Attending: Obstetrics & Gynecology | Admitting: Obstetrics & Gynecology

## 2015-06-24 ENCOUNTER — Encounter (HOSPITAL_COMMUNITY): Payer: Self-pay | Admitting: Certified Registered Nurse Anesthetist

## 2015-06-24 DIAGNOSIS — Z880 Allergy status to penicillin: Secondary | ICD-10-CM | POA: Insufficient documentation

## 2015-06-24 DIAGNOSIS — I10 Essential (primary) hypertension: Secondary | ICD-10-CM | POA: Diagnosis not present

## 2015-06-24 DIAGNOSIS — N950 Postmenopausal bleeding: Secondary | ICD-10-CM | POA: Diagnosis present

## 2015-06-24 DIAGNOSIS — Z87891 Personal history of nicotine dependence: Secondary | ICD-10-CM | POA: Diagnosis not present

## 2015-06-24 HISTORY — PX: DILATION AND CURETTAGE OF UTERUS: SHX78

## 2015-06-24 LAB — CBC
HEMATOCRIT: 37.3 % (ref 36.0–46.0)
Hemoglobin: 12.1 g/dL (ref 12.0–15.0)
MCH: 26.9 pg (ref 26.0–34.0)
MCHC: 32.4 g/dL (ref 30.0–36.0)
MCV: 83.1 fL (ref 78.0–100.0)
PLATELETS: 150 10*3/uL (ref 150–400)
RBC: 4.49 MIL/uL (ref 3.87–5.11)
RDW: 15 % (ref 11.5–15.5)
WBC: 7.8 10*3/uL (ref 4.0–10.5)

## 2015-06-24 LAB — PREGNANCY, URINE: Preg Test, Ur: NEGATIVE

## 2015-06-24 SURGERY — DILATION AND CURETTAGE
Anesthesia: General | Site: Vagina

## 2015-06-24 MED ORDER — SCOPOLAMINE 1 MG/3DAYS TD PT72
MEDICATED_PATCH | TRANSDERMAL | Status: AC
  Filled 2015-06-24: qty 1

## 2015-06-24 MED ORDER — FENTANYL CITRATE (PF) 100 MCG/2ML IJ SOLN
INTRAMUSCULAR | Status: AC
  Filled 2015-06-24: qty 2

## 2015-06-24 MED ORDER — KETOROLAC TROMETHAMINE 30 MG/ML IJ SOLN
INTRAMUSCULAR | Status: AC
  Filled 2015-06-24: qty 1

## 2015-06-24 MED ORDER — OXYCODONE-ACETAMINOPHEN 5-325 MG PO TABS
1.0000 | ORAL_TABLET | Freq: Four times a day (QID) | ORAL | Status: DC | PRN
Start: 2015-06-24 — End: 2015-08-07

## 2015-06-24 MED ORDER — KETOROLAC TROMETHAMINE 30 MG/ML IJ SOLN
INTRAMUSCULAR | Status: DC | PRN
  Administered 2015-06-24: 30 mg via INTRAVENOUS

## 2015-06-24 MED ORDER — ONDANSETRON HCL 4 MG/2ML IJ SOLN
INTRAMUSCULAR | Status: DC | PRN
Start: 2015-06-24 — End: 2015-06-24
  Administered 2015-06-24: 4 mg via INTRAVENOUS

## 2015-06-24 MED ORDER — FENTANYL CITRATE (PF) 100 MCG/2ML IJ SOLN
INTRAMUSCULAR | Status: DC | PRN
  Administered 2015-06-24 (×2): 50 ug via INTRAVENOUS

## 2015-06-24 MED ORDER — MIDAZOLAM HCL 2 MG/2ML IJ SOLN
INTRAMUSCULAR | Status: DC | PRN
  Administered 2015-06-24: 2 mg via INTRAVENOUS

## 2015-06-24 MED ORDER — BUPIVACAINE HCL 0.5 % IJ SOLN
INTRAMUSCULAR | Status: DC | PRN
  Administered 2015-06-24: 10 mL

## 2015-06-24 MED ORDER — PROPOFOL 10 MG/ML IV BOLUS
INTRAVENOUS | Status: DC | PRN
  Administered 2015-06-24: 100 mg via INTRAVENOUS
  Administered 2015-06-24: 200 mg via INTRAVENOUS

## 2015-06-24 MED ORDER — LACTATED RINGERS IV SOLN
INTRAVENOUS | Status: DC
  Administered 2015-06-24: 125 mL/h via INTRAVENOUS

## 2015-06-24 MED ORDER — LIDOCAINE HCL (CARDIAC) 20 MG/ML IV SOLN
INTRAVENOUS | Status: DC | PRN
  Administered 2015-06-24: 50 mg via INTRAVENOUS

## 2015-06-24 MED ORDER — ONDANSETRON HCL 4 MG/2ML IJ SOLN
INTRAMUSCULAR | Status: AC
  Filled 2015-06-24: qty 2

## 2015-06-24 MED ORDER — MIDAZOLAM HCL 2 MG/2ML IJ SOLN
INTRAMUSCULAR | Status: AC
  Filled 2015-06-24: qty 2

## 2015-06-24 MED ORDER — DEXAMETHASONE SODIUM PHOSPHATE 10 MG/ML IJ SOLN
INTRAMUSCULAR | Status: DC | PRN
  Administered 2015-06-24: 4 mg via INTRAVENOUS

## 2015-06-24 MED ORDER — SCOPOLAMINE 1 MG/3DAYS TD PT72: 1.0000 | MEDICATED_PATCH | Freq: Once | TRANSDERMAL | Status: DC

## 2015-06-24 MED ORDER — PROPOFOL 10 MG/ML IV BOLUS
INTRAVENOUS | Status: AC
  Filled 2015-06-24: qty 20

## 2015-06-24 MED ORDER — BUPIVACAINE HCL (PF) 0.5 % IJ SOLN
INTRAMUSCULAR | Status: AC
  Filled 2015-06-24: qty 30

## 2015-06-24 MED ORDER — DEXAMETHASONE SODIUM PHOSPHATE 4 MG/ML IJ SOLN
INTRAMUSCULAR | Status: AC
  Filled 2015-06-24: qty 1

## 2015-06-24 SURGICAL SUPPLY — 13 items
CATH ROBINSON RED A/P 16FR (CATHETERS) ×2 IMPLANT
CLOTH BEACON ORANGE TIMEOUT ST (SAFETY) ×2 IMPLANT
CONTAINER PREFILL 10% NBF 60ML (FORM) ×2 IMPLANT
DILATOR CANAL MILEX (MISCELLANEOUS) IMPLANT
GLOVE BIO SURGEON STRL SZ 6.5 (GLOVE) ×2 IMPLANT
GLOVE BIOGEL PI IND STRL 7.0 (GLOVE) ×1 IMPLANT
GLOVE BIOGEL PI INDICATOR 7.0 (GLOVE) ×1
GOWN STRL REUS W/TWL LRG LVL3 (GOWN DISPOSABLE) ×4 IMPLANT
NEEDLE SPNL 18GX3.5 QUINCKE PK (NEEDLE) ×2 IMPLANT
PACK VAGINAL MINOR WOMEN LF (CUSTOM PROCEDURE TRAY) ×2 IMPLANT
PAD OB MATERNITY 4.3X12.25 (PERSONAL CARE ITEMS) ×2 IMPLANT
PAD PREP 24X48 CUFFED NSTRL (MISCELLANEOUS) ×2 IMPLANT
TOWEL OR 17X24 6PK STRL BLUE (TOWEL DISPOSABLE) ×4 IMPLANT

## 2015-06-24 NOTE — Anesthesia Procedure Notes (Signed)
Procedure Name: LMA Insertion Date/Time: 06/24/2015 11:57 AM Performed by: Bufford Spikes Pre-anesthesia Checklist: Patient identified, Patient being monitored, Emergency Drugs available, Timeout performed and Suction available Patient Re-evaluated:Patient Re-evaluated prior to inductionOxygen Delivery Method: Circle system utilized Preoxygenation: Pre-oxygenation with 100% oxygen Intubation Type: IV induction Ventilation: Mask ventilation without difficulty LMA: LMA inserted LMA Size: 4.0 Grade View: Grade I Tube type: Oral Number of attempts: 1 Tube secured with: Tape Dental Injury: Teeth and Oropharynx as per pre-operative assessment

## 2015-06-24 NOTE — Op Note (Signed)
06/24/2015  12:09 PM  PATIENT:  Michelle Huerta  54 y.o. female  PRE-OPERATIVE DIAGNOSIS:  Postmenopausal bleeding  POST-OPERATIVE DIAGNOSIS:  same   PROCEDURE:  Procedure(s): DILATATION AND CURETTAGE (N/A)  SURGEON:  Surgeon(s) and Role: Emily Filbert, MD - Primary  ANESTHESIA:   general  EBL:  Total I/O In: -  Out: 30 [Urine:20; Blood:10]  BLOOD ADMINISTERED:none  DRAINS: none   LOCAL MEDICATIONS USED:  MARCAINE     SPECIMEN:  Source of Specimen:  endometrial curettings  DISPOSITION OF SPECIMEN:  PATHOLOGY  COUNTS:  YES  TOURNIQUET:  * No tourniquets in log *  DICTATION: .Dragon Dictation  PLAN OF CARE: Discharge to home after PACU  PATIENT DISPOSITION:  PACU - hemodynamically stable.   Delay start of Pharmacological VTE agent (>24hrs) due to surgical blood loss or risk of bleeding: not applicable    The risks, benefits, and alternatives of surgery were explained, understood, and accepted. All questions were answered. Consents were signed. In the operating room general anesthesia was applied without complication, and she was placed in the dorsal lithotomy position. Her vagina was prepped and draped in the usual sterile fashion. A Robinson catheter was used to drain her bladder.  A speculum was placed and a single-tooth tenaculum was used to grasp the anterior lip of her cervix. A total of 10 mL of 0.5% Marcaine was used to perform a paracervical block. Her uterus sounded to 10 cm. Her cervix was carefully and slowly dilated to accommodate a small curette. A curettage was done in all quadrants and the fundus of the uterus. A moderate amount  tissue was obtained. A gritty sensation was appreciated throughout. There was no bleeding noted at the end of the case. She was taken to the recovery room after being extubated. She tolerated the procedure well.

## 2015-06-24 NOTE — Anesthesia Preprocedure Evaluation (Signed)
Anesthesia Evaluation  Patient identified by MRN, date of birth, ID band Patient awake    Reviewed: Allergy & Precautions, NPO status , Patient's Chart, lab work & pertinent test results  History of Anesthesia Complications Negative for: history of anesthetic complications  Airway Mallampati: III  TM Distance: >3 FB Neck ROM: Full    Dental no notable dental hx. (+) Dental Advisory Given   Pulmonary asthma , former smoker,    Pulmonary exam normal breath sounds clear to auscultation       Cardiovascular hypertension, Normal cardiovascular exam Rhythm:Regular Rate:Normal  Seen by a cardiologist recently to start a weight loss medication, completely asymptomatic and reports METS >4   Neuro/Psych PSYCHIATRIC DISORDERS Depression negative neurological ROS  negative psych ROS   GI/Hepatic negative GI ROS, Neg liver ROS,   Endo/Other  Morbid obesityLupus  Renal/GU negative Renal ROS  negative genitourinary   Musculoskeletal  (+) Arthritis , Pagets   Abdominal (+) + obese,   Peds negative pediatric ROS (+)  Hematology negative hematology ROS (+)   Anesthesia Other Findings   Reproductive/Obstetrics negative OB ROS                             Anesthesia Physical Anesthesia Plan  ASA: III  Anesthesia Plan: General   Post-op Pain Management:    Induction: Intravenous  Airway Management Planned: LMA  Additional Equipment:   Intra-op Plan:   Post-operative Plan: Extubation in OR  Informed Consent: I have reviewed the patients History and Physical, chart, labs and discussed the procedure including the risks, benefits and alternatives for the proposed anesthesia with the patient or authorized representative who has indicated his/her understanding and acceptance.   Dental advisory given  Plan Discussed with: CRNA  Anesthesia Plan Comments:         Anesthesia Quick Evaluation

## 2015-06-24 NOTE — Discharge Instructions (Signed)
Dilation and Curettage or Vacuum Curettage, Care After Refer to this sheet in the next few weeks. These instructions provide you with information on caring for yourself after your procedure. Your health care provider may also give you more specific instructions. Your treatment has been planned according to current medical practices, but problems sometimes occur. Call your health care provider if you have any problems or questions after your procedure. WHAT TO EXPECT AFTER THE PROCEDURE After your procedure, it is typical to have light cramping and bleeding. This may last for 2 days to 2 weeks after the procedure. HOME CARE INSTRUCTIONS   Do not drive for 24 hours.  Wait 1 week before returning to strenuous activities.  Take your temperature 2 times a day for 4 days and write it down. Provide these temperatures to your health care provider if you develop a fever.  Avoid long periods of standing.  Avoid heavy lifting, pushing, or pulling. Do not lift anything heavier than 10 pounds (4.5 kg).  Limit stair climbing to once or twice a day.  Take rest periods often.  You may resume your usual diet.  Drink enough fluids to keep your urine clear or pale yellow.  Your usual bowel function should return. If you have constipation, you may:  Take a mild laxative with permission from your health care provider.  Add fruit and bran to your diet.  Drink more fluids.  Take showers instead of baths until your health care provider gives you permission to take baths.  Do not go swimming or use a hot tub until your health care provider approves.  Try to have someone with you or available to you the first 24-48 hours, especially if you were given a general anesthetic.  Do not douche, use tampons, or have sex (intercourse) for 2 weeks after the procedure.  Only take over-the-counter or prescription medicines as directed by your health care provider. Do not take aspirin. It can cause  bleeding.  Follow up with your health care provider as directed. SEEK MEDICAL CARE IF:   You have increasing cramps or pain that is not relieved with medicine.  You have abdominal pain that does not seem to be related to the same area of earlier cramping and pain.  You have bad smelling vaginal discharge.  You have a rash.  You are having problems with any medicine. SEEK IMMEDIATE MEDICAL CARE IF:   You have bleeding that is heavier than a normal menstrual period.  You have a fever.  You have chest pain.  You have shortness of breath.  You feel dizzy or feel like fainting.  You pass out.  You have pain in your shoulder strap area.  You have heavy vaginal bleeding with or without blood clots. MAKE SURE YOU:   Understand these instructions.  Will watch your condition.  Will get help right away if you are not doing well or get worse.   This information is not intended to replace advice given to you by your health care provider. Make sure you discuss any questions you have with your health care provider.   Document Released: 06/24/2000 Document Revised: 07/02/2013 Document Reviewed: 01/24/2013 Elsevier Interactive Patient Education 2016 [place]: D&C / D&E The following instructions have been prepared to help you care for yourself upon your return home.   Personal hygiene:  Use sanitary pads for vaginal drainage, not tampons.  Shower the day after your procedure.  NO tub baths, pools or Jacuzzis for 2-3 weeks.  Wipe front to back after using the bathroom.  Activity and limitations:  Do NOT drive or operate any equipment for 24 hours. The effects of anesthesia are still present and drowsiness may result.  Do NOT rest in bed all day.  Walking is encouraged.  Walk up and down stairs slowly.  You may resume your normal activity in one to two days or as indicated by your physician.  Sexual activity: NO intercourse for at least 2  weeks after the procedure, or as indicated by your physician.  Diet: Eat a light meal as desired this evening. You may resume your usual diet tomorrow.  Return to work: You may resume your work activities in one to two days or as indicated by your doctor.  What to expect after your surgery: Expect to have vaginal bleeding/discharge for 2-3 days and spotting for up to 10 days. It is not unusual to have soreness for up to 1-2 weeks. You may have a slight burning sensation when you urinate for the first day. Mild cramps may continue for a couple of days. You may have a regular period in 2-6 weeks.  Call your doctor for any of the following:  Excessive vaginal bleeding, saturating and changing one pad every hour.  Inability to urinate 6 hours after discharge from hospital.  Pain not relieved by pain medication.  Fever of 100.4 F or greater.  Unusual vaginal discharge or odor.   Call for an appointment:    Patients signature: ______________________  Nurses signature ________________________  Support person's signature_______________________

## 2015-06-24 NOTE — Anesthesia Postprocedure Evaluation (Signed)
Anesthesia Post Note  Patient: Michelle Huerta  Procedure(s) Performed: Procedure(s) (LRB): DILATATION AND CURETTAGE (N/A)  Patient location during evaluation: PACU Anesthesia Type: General Level of consciousness: awake and alert Pain management: pain level controlled Vital Signs Assessment: post-procedure vital signs reviewed and stable Respiratory status: spontaneous breathing, nonlabored ventilation, respiratory function stable and patient connected to nasal cannula oxygen Cardiovascular status: blood pressure returned to baseline and stable Postop Assessment: no signs of nausea or vomiting Anesthetic complications: no    Last Vitals:  Filed Vitals:   06/24/15 1315 06/24/15 1319  BP: 109/98   Pulse: 72 73  Temp:  36.6 C  Resp: 15 12    Last Pain: There were no vitals filed for this visit.               Mariaceleste Herrera JENNETTE

## 2015-06-24 NOTE — Transfer of Care (Signed)
Immediate Anesthesia Transfer of Care Note  Patient: Michelle Huerta  Procedure(s) Performed: Procedure(s): DILATATION AND CURETTAGE (N/A)  Patient Location: PACU  Anesthesia Type:General  Level of Consciousness: awake, alert  and oriented  Airway & Oxygen Therapy: Patient Spontanous Breathing and Patient connected to nasal cannula oxygen  Post-op Assessment: Report given to RN and Post -op Vital signs reviewed and stable  Post vital signs: Reviewed and stable  Last Vitals:  Filed Vitals:   06/24/15 1100  BP: 129/83  Pulse: 72  Temp: 36.8 C  Resp: 20    Complications: No apparent anesthesia complications

## 2015-06-24 NOTE — H&P (Signed)
Michelle Huerta is an 54 y.o. female. She is here for a d&c for evaluation of PMB. Her u/s showed and indistinct endometrium and large fibroids.   No LMP recorded. Patient is not currently having periods (Reason: Perimenopausal).    Past Medical History  Diagnosis Date  . Allergy   . Asthma     no medicines  . Glaucoma   . Depression     no meds  . Heart murmur   . Hypertension   . Osteoporosis   . Paget's disease   . Lupus ([place])   . Thrombocytopenia ([place])   . Chronic back pain   . Bilateral chronic knee pain   . DDD (degenerative disc disease), cervical   . Morbid obesity ([place]) 06/11/2015    Past Surgical History  Procedure Laterality Date  . Tubal ligation    . Breast enhancement surgery    . Breast reduction surgery    . Toe surgery      removal of bone in each foot    Family History  Problem Relation Age of Onset  . Colon cancer Neg Hx     Social History:  reports that she has quit smoking. She does not have any smokeless tobacco history on file. She reports that she drinks alcohol. She reports that she does not use illicit drugs.  Allergies:  Allergies  Allergen Reactions  . Reclast [Zoledronic Acid] Nausea And Vomiting  . Penicillins Rash    Prescriptions prior to admission  Medication Sig Dispense Refill Last Dose  . acetaminophen (TYLENOL) 325 MG tablet Take 650 mg by mouth every 6 (six) hours as needed for mild pain.    Past Month at Unknown time  . diclofenac (VOLTAREN) 75 MG EC tablet Take 75 mg by mouth daily.   Past Week at Unknown time  . hydroxychloroquine (PLAQUENIL) 200 MG tablet Take 200 mg by mouth daily.    Past Month  . misoprostol (CYTOTEC) 200 MCG tablet Take 3 pills by mouth the night before d and c. 3 tablet 0 06/23/2015 at 2000  . valACYclovir (VALTREX) 500 MG tablet Take 500 mg by mouth daily.   06/23/2015 at 2000  . valsartan-hydrochlorothiazide (DIOVAN-HCT) 320-25 MG per tablet Take 1 tablet by mouth daily.   06/23/2015 at 2000  .  albuterol (PROVENTIL HFA;VENTOLIN HFA) 108 (90 BASE) MCG/ACT inhaler Inhale 2 puffs into the lungs every 6 (six) hours as needed for wheezing or shortness of breath. Additional refills from primary care physician 18 g 0 More than a month at Unknown time  . alendronate (FOSAMAX) 70 MG tablet Take 70 mg by mouth every 7 (seven) days. On Monday  -  Take with a full glass of water on an empty stomach.   More than a month at Unknown time    ROS  Blood pressure 129/83, pulse 72, temperature 98.3 F (36.8 C), temperature source Oral, resp. rate 20, height 5\' 7"  (1.702 m), weight 133.811 kg (295 lb), SpO2 100 %. Physical Exam  Heart- rrr Lungs- CTAB Abd- obese, benign  Results for orders placed or performed during the hospital encounter of 06/24/15 (from the past 24 hour(s))  CBC     Status: None   Collection Time: 06/24/15 10:15 AM  Result Value Ref Range   WBC 7.8 4.0 - 10.5 K/uL   RBC 4.49 3.87 - 5.11 MIL/uL   Hemoglobin 12.1 12.0 - 15.0 g/dL   HCT 37.3 36.0 - 46.0 %   MCV 83.1 78.0 - 100.0  fL   MCH 26.9 26.0 - 34.0 pg   MCHC 32.4 30.0 - 36.0 g/dL   RDW 15.0 11.5 - 15.5 %   Platelets 150 150 - 400 K/uL  Pregnancy, urine     Status: None   Collection Time: 06/24/15 10:15 AM  Result Value Ref Range   Preg Test, Ur NEGATIVE NEGATIVE    No results found.  Assessment/Plan: pmb- plan for d&c She understands that there are risks of surgery. She wishes to proceed.  Neidra Girvan C. 06/24/2015, 11:36 AM

## 2015-06-25 ENCOUNTER — Encounter (HOSPITAL_COMMUNITY): Payer: Self-pay | Admitting: Obstetrics & Gynecology

## 2015-06-25 ENCOUNTER — Ambulatory Visit (HOSPITAL_COMMUNITY): Payer: 59 | Attending: Cardiology

## 2015-06-25 ENCOUNTER — Other Ambulatory Visit: Payer: Self-pay

## 2015-06-25 DIAGNOSIS — I071 Rheumatic tricuspid insufficiency: Secondary | ICD-10-CM

## 2015-06-25 DIAGNOSIS — I5189 Other ill-defined heart diseases: Secondary | ICD-10-CM | POA: Diagnosis not present

## 2015-06-25 DIAGNOSIS — I10 Essential (primary) hypertension: Secondary | ICD-10-CM | POA: Diagnosis not present

## 2015-06-25 DIAGNOSIS — Z6841 Body Mass Index (BMI) 40.0 and over, adult: Secondary | ICD-10-CM | POA: Insufficient documentation

## 2015-06-25 DIAGNOSIS — I517 Cardiomegaly: Secondary | ICD-10-CM | POA: Diagnosis not present

## 2015-06-25 DIAGNOSIS — R0683 Snoring: Secondary | ICD-10-CM | POA: Diagnosis not present

## 2015-06-25 DIAGNOSIS — R9431 Abnormal electrocardiogram [ECG] [EKG]: Secondary | ICD-10-CM | POA: Diagnosis present

## 2015-06-25 HISTORY — DX: Rheumatic tricuspid insufficiency: I07.1

## 2015-06-25 HISTORY — DX: Cardiomegaly: I51.7

## 2015-06-25 HISTORY — DX: Other ill-defined heart diseases: I51.89

## 2015-07-01 ENCOUNTER — Telehealth: Payer: Self-pay | Admitting: *Deleted

## 2015-07-01 NOTE — Telephone Encounter (Signed)
Left message to call back  

## 2015-07-01 NOTE — Telephone Encounter (Signed)
-----   Message from Skeet Latch, MD sent at 06/30/2015 12:31 AM EST ----- Echo shows that her heart does not relax completely.  It will be important keep her blood pressure well-controlled.

## 2015-07-07 NOTE — Telephone Encounter (Signed)
Spoke to patient. Result given . Verbalized understanding  

## 2015-07-08 ENCOUNTER — Ambulatory Visit (HOSPITAL_BASED_OUTPATIENT_CLINIC_OR_DEPARTMENT_OTHER): Payer: 59 | Attending: Cardiovascular Disease | Admitting: Radiology

## 2015-07-08 VITALS — Ht 66.0 in | Wt 298.0 lb

## 2015-07-08 DIAGNOSIS — I10 Essential (primary) hypertension: Secondary | ICD-10-CM | POA: Diagnosis not present

## 2015-07-08 DIAGNOSIS — Z79899 Other long term (current) drug therapy: Secondary | ICD-10-CM | POA: Insufficient documentation

## 2015-07-08 DIAGNOSIS — R0683 Snoring: Secondary | ICD-10-CM | POA: Insufficient documentation

## 2015-07-08 DIAGNOSIS — G4733 Obstructive sleep apnea (adult) (pediatric): Secondary | ICD-10-CM | POA: Insufficient documentation

## 2015-07-08 DIAGNOSIS — R5383 Other fatigue: Secondary | ICD-10-CM | POA: Insufficient documentation

## 2015-07-19 NOTE — Sleep Study (Signed)
Patient Name: Michelle Huerta, Michelle Huerta Date: 07/08/2015 Gender: Female D.O.B: Nov 01, 1960 Age (years): 54 Referring Provider: Skeet Latch Height (inches): 38 Interpreting Physician: Shelva Majestic MD, ABSM Weight (lbs): 298 RPSGT: Carolin Coy BMI: 81 MRN: [id number] Neck Size: 16.50  CLINICAL INFORMATION Sleep Study Type: NPSG Indication for sleep study: Fatigue, Hypertension, Snoring Epworth Sleepiness Score: 3  SLEEP STUDY TECHNIQUE As per the AASM Manual for the Scoring of Sleep and Associated Events v2.3 (April 2016) with a hypopnea requiring 4% desaturations. The channels recorded and monitored were frontal, central and occipital EEG, electrooculogram (EOG), submentalis EMG (chin), nasal and oral airflow, thoracic and abdominal wall motion, anterior tibialis EMG, snore microphone, electrocardiogram, and pulse oximetry.  MEDICATIONS  albuterol (PROVENTIL HFA;VENTOLIN HFA) 108 (90 BASE) MCG/ACT inhaler 2 puff, Every 6 hours PRN     alendronate (FOSAMAX) 70 MG tablet 70 mg, Every 7 days     diclofenac (VOLTAREN) 75 MG EC tablet 75 mg, Daily     hydroxychloroquine (PLAQUENIL) 200 MG tablet 200 mg, Daily     oxyCODONE-acetaminophen (PERCOCET/ROXICET) 5-325 MG tablet 1-2 tablet, Every 6 hours PRN     valACYclovir (VALTREX) 500 MG tablet 500 mg, Daily     valsartan-hydrochlorothiazide (DIOVAN-HCT) 320-25 MG per tablet 1 tablet, Daily    Patient's medications include: TEMAZEPAM. Medications self-administered by patient during sleep study : Sleep medicine administered - None  SLEEP ARCHITECTURE The study was initiated at 10:13:53 PM and ended at 5:09:21 AM. Sleep onset time was 20.0 minutes and the sleep efficiency was 89.8%. The total sleep time was 373.0 minutes. Wake after sleep onset (WASO) was 22.5 minutes Stage REM latency was 97.0 minutes. The patient spent 5.90% of the night in stage N1 sleep, 77.48% in stage N2 sleep, 4.29% in stage N3 and 12.33% in REM. Alpha  intrusion was absent. Supine sleep was 45.84%.  RESPIRATORY PARAMETERS The overall apnea/hypopnea index (AHI) was 13.7 per hour. There were 38 total apneas, including 37 obstructive, 1 central and 0 mixed apneas. There were 47 hypopneas and 4 RERAs. The AHI during Stage REM sleep was 53.5 per hour. AHI while supine was 10.5 per hour. The mean oxygen saturation was 94.58%. The minimum SpO2 during sleep was 80.00%. Moderate snoring was noted during this study.  CARDIAC DATA The 2 lead EKG demonstrated sinus rhythm. The mean heart rate was 74.31 beats per minute. Other EKG findings include: None.  LEG MOVEMENT DATA The total PLMS were 0 with a resulting PLMS index of 0.00. Associated arousal with leg movement index was 0.0 .  IMPRESSIONS - Mild obstructive sleep apnea/hypopnea syndrome overall (AHI 13.7/h), but severe Sleep apnea during REM sleep (AHI 53.5/h). - No significant central sleep apnea occurred during this study (CAI = 0.2/h). - Moderate oxygen desaturation to a nadir of 80.00%. - Moderate snoring. - No cardiac abnormalities were noted during this study. - Clinically significant periodic limb movements did not occur during sleep. No significant associated arousals. - The arousal index was abnormal,  DIAGNOSIS - Obstructive Sleep Apnea (327.23 [G47.33 ICD-10])  RECOMMENDATIONS - Therapeutic CPAP titration to determine optimal pressure required to alleviate sleep disordered breathing. - Efforts should be made to optimize nasal and oropharyngeal patency. - Avoid alcohol, sedatives and other CNS depressants that may worsen sleep apnea and disrupt normal sleep architecture. - Sleep hygiene should be reviewed to assess factors that may improve sleep quality. - Weight management (BMI 49) and regular exercise should be initiated.   Troy Sine, MD, [place], American  Board of Sleep Medicine  ELECTRONICALLY SIGNED ON:  07/19/2015, 12:46 PM [place] PH: [phone]   FX: [phone] [place]

## 2015-07-22 ENCOUNTER — Telehealth: Payer: Self-pay | Admitting: *Deleted

## 2015-07-22 ENCOUNTER — Other Ambulatory Visit: Payer: Self-pay | Admitting: *Deleted

## 2015-07-22 DIAGNOSIS — G4733 Obstructive sleep apnea (adult) (pediatric): Secondary | ICD-10-CM

## 2015-07-22 NOTE — Progress Notes (Signed)
Called left message of results and recommendations. CPAP titration study ordered.

## 2015-07-22 NOTE — Telephone Encounter (Signed)
-----   Message from Troy Sine, MD sent at 07/19/2015 12:55 PM EST ----- Mariann Laster; please schedule for CPAP titration study

## 2015-07-22 NOTE — Telephone Encounter (Signed)
Left detailed message of sleep study results and recommendations. Call back if questions or concerns.

## 2015-07-23 ENCOUNTER — Telehealth: Payer: Self-pay | Admitting: *Deleted

## 2015-07-23 NOTE — Telephone Encounter (Signed)
Pt is calling back to speak with you about the results from her Sleep Apnea test. Please f/u with her  Thanks

## 2015-07-24 MED FILL — VENTOLIN HFA 90 MCG INHALER: 108 (90 BAS | 12 days supply | Qty: 18 | Fill #1

## 2015-07-28 ENCOUNTER — Ambulatory Visit (HOSPITAL_BASED_OUTPATIENT_CLINIC_OR_DEPARTMENT_OTHER): Payer: 59 | Attending: Cardiovascular Disease | Admitting: Radiology

## 2015-07-28 DIAGNOSIS — G4733 Obstructive sleep apnea (adult) (pediatric): Secondary | ICD-10-CM | POA: Diagnosis not present

## 2015-07-28 DIAGNOSIS — G4730 Sleep apnea, unspecified: Secondary | ICD-10-CM | POA: Diagnosis present

## 2015-07-28 DIAGNOSIS — R0683 Snoring: Secondary | ICD-10-CM | POA: Insufficient documentation

## 2015-07-28 DIAGNOSIS — Z79899 Other long term (current) drug therapy: Secondary | ICD-10-CM | POA: Insufficient documentation

## 2015-08-03 ENCOUNTER — Encounter: Payer: Self-pay | Admitting: Podiatry

## 2015-08-03 ENCOUNTER — Other Ambulatory Visit: Payer: Self-pay

## 2015-08-03 ENCOUNTER — Inpatient Hospital Stay: Admission: RE | Admit: 2015-08-03 | Payer: Self-pay | Source: Ambulatory Visit

## 2015-08-03 ENCOUNTER — Ambulatory Visit (INDEPENDENT_AMBULATORY_CARE_PROVIDER_SITE_OTHER): Payer: 59 | Admitting: Podiatry

## 2015-08-03 VITALS — BP 163/88 | HR 76 | Ht 67.0 in | Wt 295.0 lb

## 2015-08-03 DIAGNOSIS — M216X2 Other acquired deformities of left foot: Secondary | ICD-10-CM

## 2015-08-03 DIAGNOSIS — M216X1 Other acquired deformities of right foot: Secondary | ICD-10-CM | POA: Insufficient documentation

## 2015-08-03 DIAGNOSIS — M216X9 Other acquired deformities of unspecified foot: Secondary | ICD-10-CM

## 2015-08-03 DIAGNOSIS — M722 Plantar fascial fibromatosis: Secondary | ICD-10-CM

## 2015-08-03 DIAGNOSIS — M21969 Unspecified acquired deformity of unspecified lower leg: Secondary | ICD-10-CM

## 2015-08-03 NOTE — Patient Instructions (Signed)
Seen for painful heels. Noted of tight Achilles tendon and weak first metatarsal bones. Need daily stretch exercise as demonstrated. Both feet casted for Orthotics. Will call when they are ready.

## 2015-08-03 NOTE — Progress Notes (Signed)
SUBJECTIVE: 55 y.o. year old female presents complaining of chronic on going heel pain bilateral for a couple of years. Patient tried OTC orthotics that weren't helping. Patient wants to try Custom Orthotics. 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   REVIEW OF SYSTEMS: Constitutional: negative Eyes: negative Ears, nose, mouth, throat, and face: negative Respiratory: Diagnosed with Sleep Apnea. Cardiovascular: Diagnosed with low heart beat. Gastrointestinal: negative Genitourinary:negative Integument/breast: negative Musculoskeletal:Diagnosed with Padget's disease and osteoarthritis.  Neurological: negative Endocrine: negative Allergic/Immunologic: Diagnosed with Skin Lupus.  OBJECTIVE: DERMATOLOGIC EXAMINATION: Nails: normal appearing nails bilaterally No abnormal skin lesions noted. VASCULAR EXAMINATION OF LOWER LIMBS: Pedal pulses: All pedal pulses are palpable with normal pulsation.  Capillary Filling times within 3 seconds in all digits.  No edema or ischemic changes noted. Temperature gradient from tibial crest to dorsum of foot is within normal bilateral. NEUROLOGIC EXAMINATION OF THE LOWER LIMBS: Achilles DTR is present and within normal. Monofilament (Semmes-Weinstein 10-gm) sensory testing positive 6 out of 6, bilateral. Vibratory sensations(128Hz  turning fork) intact at medial and lateral forefoot bilateral.  Sharp and Dull discriminatory sensations at the plantar ball of hallux is intact bilateral.  MUSCULOSKELETAL EXAMINATION: Positive for tight Achilles tendon bilateral R>L. Positive for forefoot varus bilateral. Positive for hypermobile first ray bilateral. Positive for STJ hyperpronation with weight bearing bilateral.  Positive for plantar heel pain bilateral.   RADIOGRAPHIC STUDIES:  General overview: Negative of Osteopenia Absence of soft tissue swelling. Absence of acute osseous or articular surfaces of forefoot or rearfoot.  AP View:   Metatarsus adductus, short first Metatarsal bones (-6), Fibular sesamoid position at 5 bilateral.  Lateral view:  Positive of CYMA line anterior break bilateral. Positive of inferior and posterior calcaneal spur bilateral. Positive of large avulsed osseous fragment at posterior Achilles tendon attachment site bilateral Elevated first ray bilateral. Impressions: Short and elevated first ray with excess pronating STJ in adductus foot.   ASSESSMENT: 1. Chronic Plantar fasciitis bilateral. 2. Short elevated first metatarsal bone bilateral. 3. Ankle equinus bilateral. 4. Forefoot varus and compensatory STJ pronation bilateral.  PLAN: Reviewed clinical findings and available treatment options. Reviewed Achilles tendon stretch exercise bilateral. Patient is to keep up with it three times a day. Both feet casted for Orthotics.

## 2015-08-07 ENCOUNTER — Encounter: Payer: Self-pay | Admitting: Obstetrics & Gynecology

## 2015-08-07 ENCOUNTER — Ambulatory Visit (INDEPENDENT_AMBULATORY_CARE_PROVIDER_SITE_OTHER): Payer: 59 | Admitting: Obstetrics & Gynecology

## 2015-08-07 VITALS — BP 148/80 | HR 73 | Resp 20 | Ht 67.0 in | Wt 295.0 lb

## 2015-08-07 DIAGNOSIS — Z9889 Other specified postprocedural states: Secondary | ICD-10-CM | POA: Diagnosis not present

## 2015-08-07 DIAGNOSIS — D219 Benign neoplasm of connective and other soft tissue, unspecified: Secondary | ICD-10-CM | POA: Insufficient documentation

## 2015-08-07 DIAGNOSIS — D259 Leiomyoma of uterus, unspecified: Secondary | ICD-10-CM | POA: Diagnosis not present

## 2015-08-07 NOTE — Progress Notes (Signed)
   Subjective:    Patient ID: Michelle Huerta, female    DOB: 08-06-1960, 55 y.o.   MRN: [id number]  HPI 55 yo AA lady is here for her 6 week post op visit after a d&c for PMB. She bled lightly for 2 days post op with no cramping. She denies any post op problems.   Review of Systems     Objective:   Physical Exam Obese, pleasant AA woman Breathing, conversing, and ambulating normally Pathology- negative       Assessment & Plan:  Post op- doing well Rec weight loss to decrease risk of uterine cancer

## 2015-08-11 ENCOUNTER — Telehealth: Payer: Self-pay | Admitting: Cardiovascular Disease

## 2015-08-11 NOTE — Telephone Encounter (Signed)
New message     Calling to get sleep study results from 12-28 and 1-17.  Please call

## 2015-08-11 NOTE — Sleep Study (Signed)
Patient Name: Michelle Huerta, Grandy Date: 07/28/2015 Gender: Female D.O.B: 25-Jul-1960 Age (years): 54 Referring Provider: Skeet Latch Height (inches): 69 Interpreting Physician: Shelva Majestic MD, ABSM Weight (lbs): 295 RPSGT: Carolin Coy BMI: 54 MRN: [id number] Neck Size: 16.50  CLINICAL INFORMATION The patient is referred for a CPAP titration to treat sleep apnea: PSG overall AHI 13.7/hr, RDI 14.3/hr and AHI during REM sleep 53.5/hr.   Date of NPSG, Split Night or HST: 07/08/2015  SLEEP STUDY TECHNIQUE As per the AASM Manual for the Scoring of Sleep and Associated Events v2.3 (April 2016) with a hypopnea requiring 4% desaturations. The channels recorded and monitored were frontal, central and occipital EEG, electrooculogram (EOG), submentalis EMG (chin), nasal and oral airflow, thoracic and abdominal wall motion, anterior tibialis EMG, snore microphone, electrocardiogram, and pulse oximetry. Continuous positive airway pressure (CPAP) was initiated at the beginning of the study and titrated to treat sleep-disordered breathing.  MEDICATIONS  albuterol (PROVENTIL HFA;VENTOLIN HFA) 108 (90 BASE) MCG/ACT inhaler 2 puff, Every 6 hours PRN     alendronate (FOSAMAX) 70 MG tablet 70 mg, Every 7 days     diclofenac (VOLTAREN) 75 MG EC tablet 75 mg, Daily     hydroxychloroquine (PLAQUENIL) 200 MG tablet 200 mg, Daily     oxyCODONE-acetaminophen (PERCOCET/ROXICET) 5-325 MG tablet 1-2 tablet, Every 6 hours PRN     valACYclovir (VALTREX) 500 MG tablet 500 mg, Daily     valsartan-hydrochlorothiazide (DIOVAN-HCT) 320-25 MG per tablet   Medications administered by patient during sleep study : No sleep medicine administered.  TECHNICIAN COMMENTS Comments added by technician: Patient was ordered as a cpap titration  Comments added by scorer: N/A  RESPIRATORY PARAMETERS Optimal PAP Pressure (cm): 16 AHI at Optimal Pressure (/hr): 0.0 Overall Minimal O2 (%): 85.00 Supine % at Optimal  Pressure (%): 0 Minimal O2 at Optimal Pressure (%): 97.0   SLEEP ARCHITECTURE The study was initiated at 10:58:45 PM and ended at 4:54:27 AM. Sleep onset time was 0.9 minutes and the sleep efficiency was 97.6%. The total sleep time was 347.0 minutes. The patient spent 5.91% of the night in stage N1 sleep, 64.41% in stage N2 sleep, 0.00% in stage N3 and 29.68% in REM.Stage REM latency was 65.5 minutes Wake after sleep onset was 7.8. Alpha intrusion was absent. Supine sleep was 82.85%.  CARDIAC DATA The 2 lead EKG demonstrated sinus rhythm. The mean heart rate was 65.84 beats per minute. Other EKG findings include: None.  LEG MOVEMENT DATA The total Periodic Limb Movements of Sleep (PLMS) were 18. The PLMS index was 3.11. A PLMS index of <15 is considered normal in adults.  IMPRESSIONS - The optimal PAP pressure was 16 cm of water. - Central sleep apnea was not noted during this titration (CAI = 0.2/h). - Moderate oxygen desaturation to a nadir of 85.00% at 5 cm water pressure. Minimum oxygen saturation at 16 cm water pressure was 97% and 98% during NREM and REM sleep respectively . - The patient snored with Moderate snoring volume with minimal intermittent snoring still present at 16 cm. - No cardiac abnormalities were observed during this study. - Clinically significant periodic limb movements were not noted during this study. Arousals associated with PLMs were rare.  DIAGNOSIS - Obstructive Sleep Apnea (327.23 [G47.33 ICD-10])  RECOMMENDATIONS - Recommend an initial trial of CPAP therapy on 16 cm H2O with a Medium size Resmed Nasal Pillow Mask AirFit P10 mask and heated humidification. - Avoid alcohol, sedatives and other CNS depressants that may  worsen sleep apnea and disrupt normal sleep architecture. - Sleep hygiene should be reviewed to assess factors that may improve sleep quality. - Weight management and regular exercise should be initiated or continued. - Recommend download in  30 days and sleep clinic evaluation.  Troy Sine, MD,FACC Diplomate, American Board of Sleep Medicine  ELECTRONICALLY SIGNED ON:  08/11/2015, 6:59 PM [place] PH: [phone]   FX: [phone] [place]

## 2015-08-11 NOTE — Telephone Encounter (Signed)
PT  AWARE  TEST  HAS NOT BEEN REVIEWED  AT  THIS  TIME .Adonis Housekeeper

## 2015-08-13 ENCOUNTER — Telehealth: Payer: Self-pay | Admitting: *Deleted

## 2015-08-13 NOTE — Telephone Encounter (Signed)
Patient will be notified by Dr Evette Georges CMA- Mariann Laster

## 2015-08-13 NOTE — Progress Notes (Signed)
CPAP referral to [facility]

## 2015-08-13 NOTE — Telephone Encounter (Signed)
This information should be coming from Dr. Claiborne Billings, who is titrating her CPAP machine.

## 2015-08-13 NOTE — Telephone Encounter (Signed)
-----   Message from Troy Sine, MD sent at 08/11/2015  7:15 PM EST ----- Mariann Laster; please set up with DME for CPAP and sleep clinic f/u

## 2015-08-13 NOTE — Telephone Encounter (Signed)
Left message CPAP referral sent to [facility]. Call back if questions or concerns.

## 2015-08-13 NOTE — Progress Notes (Signed)
CPAP referral sent to [facility]. 

## 2015-08-13 NOTE — Progress Notes (Signed)
Left message on patient's cell phone CPAP referral made to [facility].

## 2015-08-17 MED FILL — VENTOLIN HFA 90 MCG INHALER: 108 (90 BAS | 25 days supply | Qty: 18 | Fill #0

## 2015-08-25 DIAGNOSIS — E559 Vitamin D deficiency, unspecified: Secondary | ICD-10-CM | POA: Diagnosis not present

## 2015-08-25 DIAGNOSIS — M549 Dorsalgia, unspecified: Secondary | ICD-10-CM | POA: Diagnosis not present

## 2015-08-25 DIAGNOSIS — G4730 Sleep apnea, unspecified: Secondary | ICD-10-CM | POA: Diagnosis not present

## 2015-08-25 DIAGNOSIS — R531 Weakness: Secondary | ICD-10-CM | POA: Diagnosis not present

## 2015-08-25 DIAGNOSIS — D649 Anemia, unspecified: Secondary | ICD-10-CM | POA: Diagnosis not present

## 2015-08-25 DIAGNOSIS — R252 Cramp and spasm: Secondary | ICD-10-CM | POA: Diagnosis not present

## 2015-08-25 DIAGNOSIS — R0602 Shortness of breath: Secondary | ICD-10-CM | POA: Diagnosis not present

## 2015-08-26 DIAGNOSIS — G4733 Obstructive sleep apnea (adult) (pediatric): Secondary | ICD-10-CM | POA: Diagnosis not present

## 2015-09-14 DIAGNOSIS — R0600 Dyspnea, unspecified: Secondary | ICD-10-CM | POA: Diagnosis not present

## 2015-09-23 DIAGNOSIS — G4733 Obstructive sleep apnea (adult) (pediatric): Secondary | ICD-10-CM | POA: Diagnosis not present

## 2015-10-05 ENCOUNTER — Ambulatory Visit
Admission: RE | Admit: 2015-10-05 | Discharge: 2015-10-05 | Disposition: A | Discharge status: Home / Self Care | Payer: 59 | Source: Ambulatory Visit | Attending: Family Medicine | Admitting: Family Medicine

## 2015-10-05 ENCOUNTER — Other Ambulatory Visit: Payer: Self-pay | Admitting: Family Medicine

## 2015-10-05 DIAGNOSIS — M545 Low back pain: Secondary | ICD-10-CM

## 2015-10-05 DIAGNOSIS — M889 Osteitis deformans of unspecified bone: Secondary | ICD-10-CM | POA: Diagnosis not present

## 2015-10-05 MED FILL — CYCLOBENZAPRINE 10 MG TAB: 10 | 30 days supply | Qty: 30 | Fill #0

## 2015-10-07 ENCOUNTER — Ambulatory Visit: Payer: 59 | Admitting: Podiatry

## 2015-10-07 MED FILL — VALACYCLOVIR HCL 500 MG TAB: 500 | 90 days supply | Qty: 90 | Fill #1

## 2015-10-07 MED FILL — DICLOFENAC SOD EC 75 MG TAB: 75 | 90 days supply | Qty: 180 | Fill #1

## 2015-10-08 DIAGNOSIS — Z79899 Other long term (current) drug therapy: Secondary | ICD-10-CM | POA: Diagnosis not present

## 2015-10-08 DIAGNOSIS — M889 Osteitis deformans of unspecified bone: Secondary | ICD-10-CM | POA: Diagnosis not present

## 2015-10-08 DIAGNOSIS — M170 Bilateral primary osteoarthritis of knee: Secondary | ICD-10-CM | POA: Diagnosis not present

## 2015-10-08 DIAGNOSIS — L930 Discoid lupus erythematosus: Secondary | ICD-10-CM | POA: Diagnosis not present

## 2015-10-09 ENCOUNTER — Encounter: Payer: Self-pay | Admitting: *Deleted

## 2015-10-12 ENCOUNTER — Encounter: Payer: Self-pay | Admitting: Pulmonary Disease

## 2015-10-12 ENCOUNTER — Other Ambulatory Visit: Payer: Self-pay

## 2015-10-12 ENCOUNTER — Encounter (INDEPENDENT_AMBULATORY_CARE_PROVIDER_SITE_OTHER): Payer: Self-pay

## 2015-10-12 ENCOUNTER — Ambulatory Visit (INDEPENDENT_AMBULATORY_CARE_PROVIDER_SITE_OTHER)
Admission: RE | Admit: 2015-10-12 | Discharge: 2015-10-12 | Disposition: A | Discharge status: Home / Self Care | Payer: 59 | Source: Ambulatory Visit | Attending: Pulmonary Disease | Admitting: Pulmonary Disease

## 2015-10-12 ENCOUNTER — Ambulatory Visit (INDEPENDENT_AMBULATORY_CARE_PROVIDER_SITE_OTHER): Payer: 59 | Admitting: Pulmonary Disease

## 2015-10-12 VITALS — BP 128/74 | HR 83 | Ht 67.0 in | Wt 296.4 lb

## 2015-10-12 DIAGNOSIS — R0600 Dyspnea, unspecified: Secondary | ICD-10-CM | POA: Diagnosis not present

## 2015-10-12 DIAGNOSIS — Z1231 Encounter for screening mammogram for malignant neoplasm of breast: Secondary | ICD-10-CM

## 2015-10-12 DIAGNOSIS — R0689 Other abnormalities of breathing: Secondary | ICD-10-CM

## 2015-10-12 DIAGNOSIS — R0602 Shortness of breath: Secondary | ICD-10-CM | POA: Diagnosis not present

## 2015-10-12 MED FILL — traMADol HCL 50 MG TABS: 50 | 30 days supply | Qty: 60 | Fill #0

## 2015-10-12 NOTE — Progress Notes (Signed)
Subjective:    Patient ID: Michelle Huerta, female    DOB: Feb 27, 1961, 55 y.o.   MRN: [id number]  HPI Consult for evaluation of dyspnea.  Mrs. Elbe is a 55 year old with PMH history of discoid lupus, hypertension, morbid obesity, recent diagnosis of mild sleep apnea. She has complains of dyspnea on exertion and talking for the past few years. This is not associated with any cough, sputum production, wheezing. She does not notice any exacerbating factors, allergies. She works as a Secretary/administrator and does not report any worsening of symptoms at work.  She was recently diagnosed with mild OSA. She was started on CPAP which she says improved the symptoms however she is not using the CPAP for the past couple of weeks because of discomfort with the mask. She has been given an albuterol inhaler for the past 1 year. She uses it only occasionally. She has history of discoid lupus presenting mainly as alopecia, scalp symptoms. She is maintained on Plaquenil.  DATA: Sleep study 07/19/15  - Mild obstructive sleep apnea/hypopnea syndrome overall (AHI 13.7/h), but severe Sleep apnea during REM sleep (AHI 53.5/h). - No significant central sleep apnea occurred during this study (CAI = 0.2/h). - Moderate oxygen desaturation to a nadir of 80.00%. - Moderate snoring. - No cardiac abnormalities were noted during this study. - Clinically significant periodic limb movements did not occur during sleep. No significant associated arousals. - The arousal index was abnormal,  CPAP titration 08/11/15 The optimal PAP pressure was 16 cm of water.  Social History: She smoked about 4-5 cigars for 4 years. She quit in 2010. She has 4-8 alcoholic drinks per week. No illegal drug use. She works as a Secretary/administrator.  Family History: Mother-heart disease, hypertension   Past Medical History  Diagnosis Date  . Allergy   . Asthma     no medicines  . Glaucoma   . Depression     no meds  . Heart murmur   . Hypertension   .  Osteoporosis   . Paget's disease   . Lupus ([place])   . Thrombocytopenia ([place])   . Chronic back pain   . Bilateral chronic knee pain   . DDD (degenerative disc disease), cervical   . Morbid obesity ([place]) 06/11/2015  . Vitamin D deficiency   . Insomnia   . Paget's disease of bone     Dr Trudie Reed, Reclast insufion 02/14/10 - bad reaction to infusion, now tolerates fosamax weekly  . Tubular adenoma polyp of rectum     8/12, Dr Fuller Plan, 5 yr follow up   . Genital herpes     HSV type 2 at rectum, culture positive 3/15  . Mild sleep apnea     1/17 CPAP Dr Claiborne Billings    Current outpatient prescriptions:  .  albuterol (PROVENTIL HFA;VENTOLIN HFA) 108 (90 BASE) MCG/ACT inhaler, Inhale 2 puffs into the lungs every 6 (six) hours as needed for wheezing or shortness of breath. Additional refills from primary care physician, Disp: 18 g, Rfl: 0 .  alendronate (FOSAMAX) 70 MG tablet, Take 70 mg by mouth every 7 (seven) days. On Monday  -  Take with a full glass of water on an empty stomach., Disp: , Rfl:  .  cyclobenzaprine (FLEXERIL) 10 MG tablet, Take 10 mg by mouth daily as needed., Disp: , Rfl: 1 .  diclofenac (VOLTAREN) 75 MG EC tablet, Take 75 mg by mouth daily., Disp: , Rfl:  .  hydroxychloroquine (PLAQUENIL) 200 MG tablet, Take 200 mg  by mouth daily. Reported on 08/07/2015, Disp: , Rfl:  .  valACYclovir (VALTREX) 500 MG tablet, Take 500 mg by mouth daily., Disp: , Rfl:  .  valsartan-hydrochlorothiazide (DIOVAN-HCT) 320-25 MG per tablet, Take 1 tablet by mouth daily., Disp: , Rfl:  .  Vitamin D, Ergocalciferol, (DRISDOL) 50000 units CAPS capsule, Take 50,000 Units by mouth every Monday., Disp: , Rfl: 0   Review of Systems Dyspnea on exertion. No cough, sputum production, wheeze. No chest pain, palpitation. No nausea, vomiting, diarrhea, constipation. No fevers, chills, malaise, loss of weight. All other review of systems are negative    Objective:   Physical Exam Blood pressure 128/74, pulse 83,  height 5\' 7"  (1.702 m), weight 296 lb 6.4 oz (134.446 kg), SpO2 99 %. Gen: No apparent distress Neuro: No gross focal deficits. HEENT: No JVD, lymphadenopathy, thyromegaly. RS: Clear, No wheeze or crackles CVS: S1-S2 heard, no murmurs rubs gallops. Abdomen: Soft, positive bowel sounds. Musculoskeletal: No edema.    Assessment & Plan:  #1 Dyspnea on exertion I suspect this is related to her sleep apnea, morbid obesity, deconditioning. She does have discoid lupus but there is no evidence at present of pulmonary involvement. I asked her to continue using the albuterol when necessary and will schedule her for pulmonary function tests and a chest x-ray for further evaluation.  #2 Mild OSA She was started on CPAP after recent diagnosis of mild OSA. She hasn't been using the CPAP for the past few weeks. She has a follow-up with Dr. Claiborne Billings at the sleep clinic in 2 weeks. She'll address this issue at that time.  Plan: - PFTs - CXR - Continue albuterol rescue inhaler.  Return in 1 month.  Marshell Garfinkel MD [facility] Pager [phone] If no answer or after 3pm call: [phone] 10/12/2015, 10:40 AM

## 2015-10-12 NOTE — Patient Instructions (Signed)
We will schedule you for lung function tests and a chest x-ray.  Return to clinic in 1 month.

## 2015-10-14 NOTE — Progress Notes (Signed)
Quick Note:  Spoke with pt and notified of results per Dr. Mannam. Pt verbalized understanding and denied any questions.  ______ 

## 2015-10-23 ENCOUNTER — Encounter: Payer: Self-pay | Admitting: *Deleted

## 2015-10-24 DIAGNOSIS — G4733 Obstructive sleep apnea (adult) (pediatric): Secondary | ICD-10-CM | POA: Diagnosis not present

## 2015-10-26 ENCOUNTER — Ambulatory Visit (INDEPENDENT_AMBULATORY_CARE_PROVIDER_SITE_OTHER): Payer: 59 | Admitting: Cardiovascular Disease

## 2015-10-26 ENCOUNTER — Encounter: Payer: Self-pay | Admitting: Cardiovascular Disease

## 2015-10-26 VITALS — BP 130/86 | HR 84 | Ht 67.0 in | Wt 298.0 lb

## 2015-10-26 DIAGNOSIS — G4733 Obstructive sleep apnea (adult) (pediatric): Secondary | ICD-10-CM | POA: Diagnosis not present

## 2015-10-26 DIAGNOSIS — M545 Low back pain: Secondary | ICD-10-CM | POA: Diagnosis not present

## 2015-10-26 DIAGNOSIS — Z9989 Dependence on other enabling machines and devices: Secondary | ICD-10-CM

## 2015-10-26 NOTE — Patient Instructions (Signed)
Your physician recommends that you schedule a follow-up appointment in: June 22 @ 4: 15 sleep clinic.  [facility] has made pressure changes today in your CPAP machine.

## 2015-10-28 ENCOUNTER — Encounter: Payer: Self-pay | Admitting: Cardiovascular Disease

## 2015-10-28 DIAGNOSIS — Z9989 Dependence on other enabling machines and devices: Secondary | ICD-10-CM | POA: Insufficient documentation

## 2015-10-28 DIAGNOSIS — G4733 Obstructive sleep apnea (adult) (pediatric): Secondary | ICD-10-CM | POA: Insufficient documentation

## 2015-10-28 NOTE — Progress Notes (Signed)
Patient ID: Michelle Huerta, female   DOB: 02-23-1961, 55 y.o.   MRN: [id number]     HPI: Michelle Huerta is a 55 y.o. female presents for sleep clinic following initiation of CPAP therapy for obstructive sleep apnea.  Ms. Michelle Huerta a 55 year old female patient of Dr. Oval Linsey who has a history of hypertension, lupus and Paget's disease.  She works in housekeeping at [employer].  When she had seen Dr. Oval Linsey.  She reported awakening feeling as though she was smothering and had nonrestorative sleep.  She was referred for sleep study which was done on 07/08/2015 at [facility].  This revealed mild obstructive sleep apnea overall with an AHI of 13.7, but her sleep apnea was very severe during rems sleep with an AHI of 53.5 per hour.  She had significant oxygen desaturation to a nadir of 80% and there was evidence for moderate snoring.  She was referred for CPAP titration and was titrated up to 16 cm water pressure with an excellent result.  Her setup date was 08/26/2015.  She has been using a ResMed air.  PT and medium size mask.  She has an air sent 10 AutoSet unit.  Compliance data was generated from 08/26/2015 through 10/21/2015.  Presently she's not meeting compliance standards in that usage stays were only 53% and days of greater than for her use.  Her only 42%.  However, when she uses CPAP, her AHI was excellent at 0.6.  There was incidences of increased mask leak.  She presents for evaluation. An Epworth Sleepiness Scale score was endorsed at 2 as noted above, arguing against residual daytime sleepiness.   Epworth Sleepiness Scale: Situation   Chance of Dozing/Sleeping (0 = never , 1 = slight chance , 2 = moderate chance , 3 = high chance )   sitting and reading 0   watching TV 1   sitting inactive in a public place 0   being a passenger in a motor vehicle for an hour or more 0   lying down in the afternoon 1   sitting and talking to someone 0   sitting quietly after lunch  (no alcohol) 0   while stopped for a few minutes in traffic as the driver 0   Total Score  2    Past Medical History  Diagnosis Date  . Allergy   . Asthma     no medicines  . Glaucoma   . Depression     no meds  . Heart murmur   . Hypertension   . Osteoporosis   . Paget's disease   . Lupus ([place])   . Thrombocytopenia ([place])   . Chronic back pain   . Bilateral chronic knee pain   . DDD (degenerative disc disease), cervical   . Morbid obesity ([place]) 06/11/2015  . Vitamin D deficiency   . Insomnia   . Paget's disease of bone     Dr Trudie Reed, Reclast insufion 02/14/10 - bad reaction to infusion, now tolerates fosamax weekly  . Tubular adenoma polyp of rectum     8/12, Dr Fuller Plan, 5 yr follow up   . Genital herpes     HSV type 2 at rectum, culture positive 3/15  . Mild sleep apnea     1/17 CPAP Dr Claiborne Billings    Past Surgical History  Procedure Laterality Date  . Tubal ligation    . Breast enhancement surgery    . Breast reduction surgery    . Toe  surgery      removal of bone in each foot  . Dilation and curettage of uterus N/A 06/24/2015    Procedure: DILATATION AND CURETTAGE;  Surgeon: Emily Filbert, MD;  Location: [facility];  Service: Gynecology;  Laterality: N/A;    Allergies  Allergen Reactions  . Reclast [Zoledronic Acid] Nausea And Vomiting  . Penicillins Rash    Current Outpatient Prescriptions  Medication Sig Dispense Refill  . alendronate (FOSAMAX) 70 MG tablet Take 70 mg by mouth every 7 (seven) days. On Monday  -  Take with a full glass of water on an empty stomach.    . cyclobenzaprine (FLEXERIL) 10 MG tablet Take 10 mg by mouth daily as needed.  1  . diclofenac (VOLTAREN) 75 MG EC tablet Take 75 mg by mouth daily.    . hydroxychloroquine (PLAQUENIL) 200 MG tablet Take 200 mg by mouth daily. Reported on 08/07/2015    . valACYclovir (VALTREX) 500 MG tablet Take 500 mg by mouth daily.    . valsartan-hydrochlorothiazide (DIOVAN-HCT) 320-25 MG per tablet Take 1 tablet by  mouth daily.    . Vitamin D, Ergocalciferol, (DRISDOL) 50000 units CAPS capsule Take 50,000 Units by mouth every Monday.  0   No current facility-administered medications for this visit.    Social History   Social History  . Marital Status: Single    Spouse Name: N/A  . Number of Children: N/A  . Years of Education: N/A   Occupational History  . Not on file.   Social History Main Topics  . Smoking status: Former Smoker -- 7 years    Types: Cigars    Quit date: 08/11/2008  . Smokeless tobacco: Never Used     Comment: smoked Black&Milds, 1 pack per day (5 in a pack)  . Alcohol Use: 0.0 oz/week    0 Standard drinks or equivalent per week     Comment: Drinks about 8 glasses of beer a week  . Drug Use: No  . Sexual Activity: Yes    Birth Control/ Protection: Surgical   Other Topics Concern  . Not on file   Social History Narrative   Epworth sleepiness score as of 06/11/15 a 4   Exercise: yes, active at work, no formal exercise   Single, never married, in relationship off and on   Children: [place], [place] (both in [place]), 2 grandkids.  Raised 2 nieces [name], [address] (they have 5 kids)   Occupation: [employer]   Religion: faith important, not active in a church   Seatbelt use: yes    Family History  Problem Relation Age of Onset  . Colon cancer Neg Hx   . Hypertension Mother   . Heart disease Mother   . Cancer Maternal Grandmother   . Stomach cancer Paternal Grandmother      ROS General: Negative; No fevers, chills, or night sweats HEENT: Negative; No changes in vision or hearing, sinus congestion, difficulty swallowing Pulmonary: Negative; No cough, wheezing, shortness of breath, hemoptysis Cardiovascular: Negative; No chest pain, presyncope, syncope, palpatations GI: Negative; No nausea, vomiting, diarrhea, or abdominal pain GU: Negative; No dysuria, hematuria, or difficulty voiding Musculoskeletal: Negative; no myalgias, joint pain,  or weakness Hematologic: Negative; no easy bruising, bleeding Endocrine: Negative; no heat/cold intolerance Neuro: Negative; no changes in balance, headaches Skin: Negative; No rashes or skin lesions Psychiatric: Negative; No behavioral problems, depression Sleep: Positive for obstructive sleep apnea, now on CPAP therapy.; No daytime sleepiness, hypersomnolence, bruxism, restless legs, hypnogognic  hallucinations, no cataplexy   Physical Exam BP 130/86 mmHg  Pulse 84  Ht 5\' 7"  (1.702 m)  Wt 298 lb (135.172 kg)  BMI 46.66 kg/m2  Wt Readings from Last 3 Encounters:  10/26/15 298 lb (135.172 kg)  10/12/15 296 lb 6.4 oz (134.446 kg)  08/07/15 295 lb (133.811 kg)   General: Alert, oriented, no distress.  Skin: normal turgor, no rashes HEENT: Normocephalic, atraumatic. Pupils round and reactive; sclera anicteric; extraocular muscles intact; Fundi without hemorrhages or exudates Nose without nasal septal hypertrophy Mouth/Parynx benign; Mallinpatti scale 3 Neck: No JVD, no carotid bruits Lungs: clear to ausculatation and percussion; no wheezing or rales  Chest wall: No tenderness to palpation Heart: RRR, s1 s2 normal; faint 1/6 systolic murmur.  Abdomen: soft, nontender; no hepatosplenomehaly, BS+; abdominal aorta nontender and not dilated by palpation. Back: No CVA tenderness Pulses 2+ Extremities: no clubbinbg cyanosis or edema, Homan's sign negative  Neurologic: grossly nonfocal; cranial nerves intact. Psychological: Normal affect and mood.  ECG (independently read by me): Not done today  LABS:  BMP Latest Ref Rng 06/19/2012 02/16/2010 02/16/2010  Glucose 70 - 99 mg/dL 90 121(H) 120(H)  BUN 6 - 23 mg/dL 19 11 11   Creatinine 0.50 - 1.10 mg/dL 0.74 0.8 0.77  Sodium 135 - 145 mEq/L 136 137 134(L)  Potassium 3.5 - 5.1 mEq/L 3.8 3.6 3.3(L)  Chloride 96 - 112 mEq/L 98 106 104  CO2 19 - 32 mEq/L 27 - 22  Calcium 8.4 - 10.5 mg/dL 9.6 - 8.4     No flowsheet data found.   CBC  Latest Ref Rng 06/24/2015 06/19/2012 02/16/2010  WBC 4.0 - 10.5 K/uL 7.8 5.2 -  Hemoglobin 12.0 - 15.0 g/dL 12.1 12.5 13.9  Hematocrit 36.0 - 46.0 % 37.3 38.8 41.0  Platelets 150 - 400 K/uL 150 148(L) -     Lipid Panel  No results found for: CHOL, TRIG, HDL, CHOLHDL, VLDL, LDLCALC, LDLDIRECT   RADIOLOGY: Dg Chest 2 View  10/12/2015  CLINICAL DATA:  Chronic shortness of breath for 1 year. EXAM: CHEST  2 VIEW COMPARISON:  01/06/2014 and 05/03/2005 radiographs FINDINGS: The cardiomediastinal silhouette is unremarkable. There is no evidence of focal airspace disease, pulmonary edema, suspicious pulmonary nodule/mass, pleural effusion, or pneumothorax. No acute bony abnormalities are identified. IMPRESSION: No active cardiopulmonary disease. Electronically Signed   By: Margarette Canada M.D.   On: 10/12/2015 13:51   Dg Lumbar Spine 2-3 Views  10/05/2015  CLINICAL DATA:  55 year old female with increasing lumbar back pain for 2 weeks with no known injury. Initial encounter. Paget disease. EXAM: LUMBAR SPINE - 2-3 VIEW COMPARISON:  Lumbar radiographs 12/25/2013, 02/01/2010. FINDINGS: Transitional lumbosacral anatomy suspected, with full size ribs at T11, hypoplastic ribs at T12, and sacralized L5 level. Stable vertebral height and alignment since 2015, and not significantly changed since 2011. Chronic sclerosis with cortical thickening and trabecular coarsening in the visualized lower thoracic levels to L1 suggesting chronic Paget disease. Stable disc spaces. Mild to moderate lower thoracic facet hypertrophy re- demonstrated. Scattered sclerosis in the visible hemipelvis also suggestive of Paget. No acute osseous abnormality identified. IMPRESSION: Evidence of chronic spinal and pelvic Paget's disease. No acute osseous abnormality identified. Electronically Signed   By: Genevie Ann M.D.   On: 10/05/2015 16:30      ASSESSMENT AND PLAN: Ms. Marvalene Coveney is a 55 year old female with morbid obesity as well as a history  of hypertension, lupus and Paget's disease.  She was recently found to have mild  to moderate obstructive sleep apnea.  Overall, but her sleep apnea was very severe, with rim sleep.  She had significant oxygen desaturation to 80%.  Presently, she has been giving herself ample time for sleep.  Typically going to bed between 9 and 10 PM and waking up at 6 AM.  She's had difficulty with the 16 cm pressure, which may be contributing to her, reduced compliance as noted on her recent download.  Presently, I will change her from a set pressure.  2.  CPAP auto with a range from 7-16.  She should be able to tolerate the lower pressures well, but if necessary, and higher pressures are needed the auto mode will titrate accordingly.  We discussed utilizing CPAP for the entire night.  In particular, we discussed normal sleep architecture and the adverse consequences of sleep apnea if untreated on cardiovascular morbidity.  Since her sleep apnea is very severe and the preponderance of rim sleep occurs in the second half of night.  We discussed the importance of making sure she wears the CPAP all evening and in particular in the latter stages of sleep when REM sleep is more likely to occur.  She is morbidly obese and weight loss was recommended.  I will see her in 2 months for follow-up evaluation and hopefully at that time, she will be meet compliance requirements.   Troy Sine, MD, [facility]  10/28/2015 7:23 PM

## 2015-11-09 ENCOUNTER — Ambulatory Visit: Payer: 59 | Admitting: Pulmonary Disease

## 2015-11-09 ENCOUNTER — Encounter (HOSPITAL_COMMUNITY): Payer: 59

## 2015-11-18 MED FILL — VALSARTAN-HCTZ 320-25 MG TA: 320-25 | 90 days supply | Qty: 90 | Fill #1

## 2015-11-23 DIAGNOSIS — G4733 Obstructive sleep apnea (adult) (pediatric): Secondary | ICD-10-CM | POA: Diagnosis not present

## 2015-11-25 DIAGNOSIS — R05 Cough: Secondary | ICD-10-CM | POA: Diagnosis not present

## 2015-11-25 MED FILL — HYDROCODONE-HOMATROPINE SYR: 5-1.5 | 6 days supply | Qty: 120 | Fill #0

## 2015-11-27 DIAGNOSIS — J069 Acute upper respiratory infection, unspecified: Secondary | ICD-10-CM | POA: Diagnosis not present

## 2015-12-01 ENCOUNTER — Encounter: Payer: Self-pay | Admitting: Gastroenterology

## 2015-12-01 MED FILL — AZITHROMYCIN 250 MG TABLET: 250 | 5 days supply | Qty: 6 | Fill #0

## 2015-12-21 DIAGNOSIS — D649 Anemia, unspecified: Secondary | ICD-10-CM | POA: Diagnosis not present

## 2015-12-21 DIAGNOSIS — E559 Vitamin D deficiency, unspecified: Secondary | ICD-10-CM | POA: Diagnosis not present

## 2015-12-21 DIAGNOSIS — G4700 Insomnia, unspecified: Secondary | ICD-10-CM | POA: Diagnosis not present

## 2015-12-21 DIAGNOSIS — I10 Essential (primary) hypertension: Secondary | ICD-10-CM | POA: Diagnosis not present

## 2015-12-24 DIAGNOSIS — G4733 Obstructive sleep apnea (adult) (pediatric): Secondary | ICD-10-CM | POA: Diagnosis not present

## 2015-12-28 DIAGNOSIS — R1310 Dysphagia, unspecified: Secondary | ICD-10-CM | POA: Diagnosis not present

## 2015-12-29 MED FILL — OMEPRAZOLE DR 40 MG CAPSULE: 40 | 30 days supply | Qty: 30 | Fill #0

## 2015-12-30 ENCOUNTER — Ambulatory Visit (AMBULATORY_SURGERY_CENTER): Payer: Self-pay | Admitting: *Deleted

## 2015-12-30 VITALS — Ht 66.5 in | Wt 298.0 lb

## 2015-12-30 DIAGNOSIS — Z86010 Personal history of colonic polyps: Secondary | ICD-10-CM

## 2015-12-30 MED ORDER — NA SULFATE-K SULFATE-MG SULF 17.5-3.13-1.6 GM/177ML PO SOLN: ORAL | Status: DC

## 2015-12-30 MED FILL — HYDROXYCHLOROQUINE 200 MG T: 200 | 90 days supply | Qty: 90 | Fill #0

## 2015-12-30 MED FILL — ALENDRONATE NA 70 MG TAB: 70 | 84 days supply | Qty: 12 | Fill #0

## 2015-12-30 NOTE — Progress Notes (Signed)
Patient denies any allergies to eggs or soy. Patient denies any problems with anesthesia/sedation. Patient denies any oxygen use at home and does not take any diet/weight loss medications.  

## 2015-12-31 ENCOUNTER — Ambulatory Visit (INDEPENDENT_AMBULATORY_CARE_PROVIDER_SITE_OTHER): Payer: 59 | Admitting: Cardiovascular Disease

## 2015-12-31 ENCOUNTER — Encounter: Payer: Self-pay | Admitting: Cardiovascular Disease

## 2015-12-31 VITALS — BP 124/82 | HR 88 | Ht 66.5 in | Wt 302.4 lb

## 2015-12-31 DIAGNOSIS — I10 Essential (primary) hypertension: Secondary | ICD-10-CM

## 2015-12-31 DIAGNOSIS — G4733 Obstructive sleep apnea (adult) (pediatric): Secondary | ICD-10-CM

## 2015-12-31 DIAGNOSIS — Z9989 Dependence on other enabling machines and devices: Secondary | ICD-10-CM

## 2015-12-31 NOTE — Patient Instructions (Signed)
Medication Instructions:   Your physician recommends that you continue on your current medications as directed. Please refer to the Current Medication list given to you today.   If you need a refill on your cardiac medications before your next appointment, please call your pharmacy.  Labwork:  NONE ORDER TODAY    Testing/Procedures:  NONE ORDER TODAY    Follow-Up   AS NEEDED  FOR SLEEP WITH DR Claiborne Billings      Any Other Special Instructions Will Be Listed Below (If Applicable).

## 2016-01-02 ENCOUNTER — Encounter: Payer: Self-pay | Admitting: Cardiovascular Disease

## 2016-01-02 NOTE — Progress Notes (Signed)
Patient ID: [name], female   DOB: 1960/10/08, 55 y.o.   MRN: [id number]     HPI: Michelle Huerta is a 55 y.o. female presents for a f/u sleep clinic following initiation of CPAP therapy for obstructive sleep apnea.  Michelle Huerta is a  patient of Michelle. Oval Huerta who has a history of hypertension, lupus and Michelle Huerta.  She works in housekeeping at [employer].  When she had seen Michelle. Oval Huerta she reported awakening feeling as though she was smothering and had nonrestorative sleep.  She was referred for sleep study which was done on 07/08/2015 at [facility].  This revealed mild obstructive sleep apnea overall with an AHI of 13.7, but her sleep apnea was very severe during REM sleep with an AHI of 53.5 per hour.  She had significant oxygen desaturation to a nadir of 80% and there was evidence for moderate snoring.  She was referred for CPAP titration and was titrated up to 16 cm water pressure with an excellent result.  Her setup date was 08/26/2015.  She has been using a ResMed air.  PT and medium size mask.  She has an Michelle Huerta unit.  Compliance data was generated from 08/26/2015 through 10/21/2015.  When  I saw her in follow-up in April 2017 .  She was not meeting compliance standards in that usage days were only 53% and days >4 hr of use was only 42%.  However, when she uses CPAP, her AHI was excellent at 0.6.  There was incidences of increased mask leak.  She presents for evaluation. An Epworth Sleepiness Scale score was endorsed at 2 as noted above, arguing against residual daytime sleepiness.   Epworth Sleepiness Scale: Situation   Chance of Dozing/Sleeping (0 = never , 1 = slight chance , 2 = moderate chance , 3 = high chance )   sitting and reading 0   watching TV 1   sitting inactive in a public place 0   being a passenger in a motor vehicle for an hour or more 0   lying down in the afternoon 1   sitting and talking to someone 0   sitting quietly  after lunch (no alcohol) 0   while stopped for a few minutes in traffic as the driver 0   Total Score  2   Since I last saw her, she has made significant improvement with compliance.  I obtained a new download from 11/30/2015 through 12/29/2015.  She is now compliant with usage stays at 93%.  Days greater than 4 hours are 80%.  She is now averaging 6 hours and 24 minutes of sleep.  She has an AutoSet unit.  She is set at a minimum pressure of 7 and a maximum pressure at 16.  Her 95th percentile pressure was 9.8 with a maximum of 10.8.  Her AHI is excellent at 1.0.  She has now been using an ResMed Airfit P10 medium style mask.  She is unaware of breakthrough snoring.  Her sleep is restorative.   A repeat Epworth Sleepiness Scale score was calculated today and this endorsed at 3, arguing against residual daytime sleepiness.  She returns for evaluation.   Past Medical History  Diagnosis Date  . Allergy   . Asthma     no medicines  . Glaucoma   . Depression     no meds  . Heart murmur   . Hypertension   . Osteoporosis   . Michelle  Huerta   . Lupus (Michelle Huerta)   . Thrombocytopenia (Michelle Huerta)   . Chronic back pain   . Bilateral chronic knee pain   . DDD (degenerative disc Huerta), cervical   . Morbid obesity (Michelle Huerta) 06/11/2015  . Vitamin D deficiency   . Insomnia   . Michelle Huerta of bone     Michelle Michelle Huerta - bad reaction to infusion, now tolerates fosamax weekly  . Tubular adenoma polyp of rectum     8/12, Michelle Huerta, 5 yr follow up   . Genital herpes     HSV type 2 at rectum, culture positive 3/15  . Mild sleep apnea     1/17 CPAP Michelle Huerta    Past Surgical History  Procedure Laterality Date  . Tubal ligation    . Breast enhancement surgery    . Breast reduction surgery    . Toe surgery      removal of bone in each foot  . Dilation and curettage of uterus N/A 06/24/2015    Procedure: DILATATION AND CURETTAGE;  Surgeon: Michelle Huerta;  Location: [facility];  Service:  Gynecology;  Laterality: N/A;    Allergies  Allergen Reactions  . Reclast [Zoledronic Acid] Nausea And Vomiting  . Penicillins Rash    Current Outpatient Prescriptions  Medication Sig Dispense Refill  . alendronate (FOSAMAX) 70 MG tablet Take 70 mg by mouth every 7 (seven) days. On Monday  -  Take with a full glass of water on an empty stomach.    . cyclobenzaprine (FLEXERIL) 10 MG tablet Take 10 mg by mouth daily as needed.  1  . diclofenac (VOLTAREN) 75 MG EC tablet Take 75 mg by mouth daily.    . hydroxychloroquine (PLAQUENIL) 200 MG tablet Take 200 mg by mouth daily. Reported on 08/07/2015    . Na Sulfate-K Sulfate-Mg Sulf 17.5-3.13-1.6 GM/180ML SOLN Suprep (no substitutions)-TAKE AS DIRECTED. 354 mL 0  . valACYclovir (VALTREX) 500 MG tablet Take 500 mg by mouth daily.    . valsartan-hydrochlorothiazide (DIOVAN-HCT) 320-25 MG per tablet Take 1 tablet by mouth daily.    . Vitamin D, Ergocalciferol, (DRISDOL) 50000 units CAPS capsule Take 50,000 Units by mouth every Monday.  0   No current facility-administered medications for this visit.    Social History   Social History  . Marital Status: Single    Spouse Name: N/A  . Number of Children: N/A  . Years of Education: N/A   Occupational History  . Not on file.   Social History Main Topics  . Smoking status: Former Smoker -- 7 years    Types: Cigars    Quit date: 08/11/2008  . Smokeless tobacco: Never Used     Comment: smoked Black&Milds, 1 pack per day (5 in a pack)  . Alcohol Use: 3.0 oz/week    0 Standard drinks or equivalent, 5 Cans of beer per week  . Drug Use: No  . Sexual Activity: Yes    Birth Control/ Protection: Surgical   Other Topics Concern  . Not on file   Social History Narrative   Epworth sleepiness score as of 06/11/15 a 4   Exercise: yes, active at work, no formal exercise   Single, never married, in relationship off and on   Children: Michelle Huerta, Michelle Huerta (both in [place]), 2 grandkids.  Raised 2  nieces Michelle Huerta, [address] (they have 5 kids)   Occupation: [employer]   Religion: faith important, not active in a church  Seatbelt use: yes    Family History  Problem Relation Age of Onset  . Colon cancer Neg Hx   . Hypertension Mother   . Heart Huerta Mother   . Cancer Maternal Grandmother   . Stomach cancer Paternal Grandmother      ROS General: Negative; No fevers, chills, or night sweats HEENT: Negative; No changes in vision or hearing, sinus congestion, difficulty swallowing Pulmonary: Negative; No cough, wheezing, shortness of breath, hemoptysis Cardiovascular: Negative; No chest pain, presyncope, syncope, palpatations GI: Negative; No nausea, vomiting, diarrhea, or abdominal pain GU: Negative; No dysuria, hematuria, or difficulty voiding Musculoskeletal: Negative; no myalgias, joint pain, or weakness Hematologic: Negative; no easy bruising, bleeding Endocrine: Negative; no heat/cold intolerance Neuro: Negative; no changes in balance, headaches Skin: Negative; No rashes or skin lesions Psychiatric: Negative; No behavioral problems, depression Sleep: Positive for obstructive sleep apnea, now on CPAP therapy.; No daytime sleepiness, hypersomnolence, bruxism, restless legs, hypnogognic hallucinations, no cataplexy   Physical Exam BP 124/82 mmHg  Pulse 88  Ht 5' 6.5" (1.689 m)  Wt 302 lb 6.4 oz (137.168 kg)  BMI 48.08 kg/m2    Wt Readings from Last 3 Encounters:  12/31/15 302 lb 6.4 oz (137.168 kg)  12/30/15 298 lb (135.172 kg)  10/26/15 298 lb (135.172 kg)   General: Alert, oriented, no distress.  Skin: normal turgor, no rashes HEENT: Normocephalic, atraumatic. Pupils round and reactive; sclera anicteric; extraocular muscles intact; Fundi without hemorrhages or exudates Nose without nasal septal hypertrophy Mouth/Parynx benign; Mallinpatti scale 3 Neck: No JVD, no carotid bruits Lungs: clear to ausculatation and percussion; no wheezing or  rales  Chest wall: No tenderness to palpation Heart: RRR, s1 s2 normal; faint 1/6 systolic murmur.  Abdomen: soft, nontender; no hepatosplenomehaly, BS+; abdominal aorta nontender and not dilated by palpation. Back: No CVA tenderness Pulses 2+ Extremities: no clubbinbg cyanosis or edema, Homan's sign negative  Neurologic: grossly nonfocal; cranial nerves intact. Psychological: Normal affect and mood.  ECG (independently read by me): Not done today  LABS:  BMP Latest Ref Rng 06/19/2012 02/16/2010 02/16/2010  Glucose 70 - 99 mg/dL 90 121(H) 120(H)  BUN 6 - 23 mg/dL 19 11 11   Creatinine 0.50 - 1.10 mg/dL 0.74 0.8 0.77  Sodium 135 - 145 mEq/L 136 137 134(L)  Potassium 3.5 - 5.1 mEq/L 3.8 3.6 3.3(L)  Chloride 96 - 112 mEq/L 98 106 104  CO2 19 - 32 mEq/L 27 - 22  Calcium 8.4 - 10.5 mg/dL 9.6 - 8.4     No flowsheet data found.   CBC Latest Ref Rng 06/24/2015 06/19/2012 02/16/2010  WBC 4.0 - 10.5 K/uL 7.8 5.2 -  Hemoglobin 12.0 - 15.0 g/dL 12.1 12.5 13.9  Hematocrit 36.0 - 46.0 % 37.3 38.8 41.0  Platelets 150 - 400 K/uL 150 148(L) -     Lipid Panel  No results found for: CHOL, TRIG, HDL, CHOLHDL, VLDL, LDLCALC, LDLDIRECT   RADIOLOGY: No results found.    ASSESSMENT AND Huerta: Ms. Tyreonna Living is a 55 year old female with morbid obesity as well as a history of hypertension, lupus and Michelle Huerta.  She was recently found to have mild to moderate obstructive sleep apnea. but her sleep apnea was very severe during REM sleep.  She had significant oxygen desaturation to 80%.  When I saw her initially, she was giving herself adequate time for sleep duration.  She was not able to tolerate the high centimeter fixed water pressure.  As result, I changed her to an AutoSet mode.  She currently is set at a minimum pressure of 7 with a maximum pressure of 16.  Her download over the past month now confirms that she is meeting Medicare compliance standards.  She is averaging 6 hours and 24 hours of  sleep per night.  Her AHI is excellent at 1.0.  I again discussed the importance of using CPAP throughout the entire night, particularly since the preponderance of REM sleep occurs in the second half of night and her sleep apnea is very severe during REM sleep.  We discussed the importance of weight loss.  She is morbidly obese with a body mass index of 48.08 kg/m.  She will return to  the cardiology care of Michelle. Oval Huerta.  I will be available in the future if problems arise from a sleep perspective.  Troy Sine, Huerta, [facility]  01/02/2016 1:20 PM

## 2016-01-04 ENCOUNTER — Telehealth: Payer: Self-pay | Admitting: Gastroenterology

## 2016-01-06 NOTE — Telephone Encounter (Signed)
Patient calling back regarding this. Patient states that she can afford trylite

## 2016-01-06 NOTE — Telephone Encounter (Signed)
Spoke with patient and told her I would call her next week when she could come pick up a Suprep sample.  Patient agreed

## 2016-01-14 ENCOUNTER — Telehealth: Payer: Self-pay

## 2016-01-14 NOTE — Telephone Encounter (Signed)
Told patient I would leave a Suprep sample up front for her to pick up.  Patient agreed.

## 2016-01-19 ENCOUNTER — Encounter: Payer: Self-pay | Admitting: Gastroenterology

## 2016-01-19 ENCOUNTER — Ambulatory Visit (AMBULATORY_SURGERY_CENTER): Payer: 59 | Admitting: Gastroenterology

## 2016-01-19 VITALS — BP 105/66 | HR 78 | Temp 98.3°F | Resp 14 | Ht 66.0 in | Wt 298.0 lb

## 2016-01-19 DIAGNOSIS — Z1211 Encounter for screening for malignant neoplasm of colon: Secondary | ICD-10-CM | POA: Diagnosis not present

## 2016-01-19 DIAGNOSIS — D125 Benign neoplasm of sigmoid colon: Secondary | ICD-10-CM

## 2016-01-19 DIAGNOSIS — K635 Polyp of colon: Secondary | ICD-10-CM | POA: Diagnosis not present

## 2016-01-19 DIAGNOSIS — Z86010 Personal history of colonic polyps: Secondary | ICD-10-CM

## 2016-01-19 DIAGNOSIS — G4733 Obstructive sleep apnea (adult) (pediatric): Secondary | ICD-10-CM | POA: Diagnosis not present

## 2016-01-19 DIAGNOSIS — F329 Major depressive disorder, single episode, unspecified: Secondary | ICD-10-CM | POA: Diagnosis not present

## 2016-01-19 DIAGNOSIS — M329 Systemic lupus erythematosus, unspecified: Secondary | ICD-10-CM | POA: Diagnosis not present

## 2016-01-19 DIAGNOSIS — I10 Essential (primary) hypertension: Secondary | ICD-10-CM | POA: Diagnosis not present

## 2016-01-19 HISTORY — PX: COLONOSCOPY: SHX174

## 2016-01-19 MED ORDER — SODIUM CHLORIDE 0.9 % IV SOLN: 500.0000 mL | INTRAVENOUS | Status: DC

## 2016-01-19 NOTE — Op Note (Signed)
[facility] Patient Name: Michelle Huerta Procedure Date: 01/19/2016 9:02 AM MRN: [id number] Endoscopist: Ladene Artist , MD Age: 55 Referring MD:  Date of Birth: 05/08/1961 Gender: Female Account #: [id number] Procedure:                Colonoscopy Indications:              Surveillance: Personal history of adenomatous                            polyps on last colonoscopy > 5 years ago Medicines:                Monitored Anesthesia Care Procedure:                Pre-Anesthesia Assessment:                           - Prior to the procedure, a History and Physical                            was performed, and patient medications and                            allergies were reviewed. The patient's tolerance of                            previous anesthesia was also reviewed. The risks                            and benefits of the procedure and the sedation                            options and risks were discussed with the patient.                            All questions were answered, and informed consent                            was obtained. Prior Anticoagulants: The patient has                            taken no previous anticoagulant or antiplatelet                            agents. ASA Grade Assessment: III - A patient with                            severe systemic disease. After reviewing the risks                            and benefits, the patient was deemed in                            satisfactory condition to undergo the procedure.  After obtaining informed consent, the colonoscope                            was passed under direct vision. Throughout the                            procedure, the patient's blood pressure, pulse, and                            oxygen saturations were monitored continuously. The                            Model PCF-H190L [id number]) scope was introduced                            through the anus and  advanced to the the cecum,                            identified by appendiceal orifice and ileocecal                            valve. The ileocecal valve, appendiceal orifice,                            and rectum were photographed. The quality of the                            bowel preparation was good. The colonoscopy was                            performed without difficulty. The patient tolerated                            the procedure well. Scope In: 9:11:33 AM Scope Out: 9:29:14 AM Scope Withdrawal Time: 0 hours 14 minutes 48 seconds  Total Procedure Duration: 0 hours 17 minutes 41 seconds  Findings:                 Seven sessile polyps were found in the sigmoid                            colon. The polyps were 4 to 6 mm in size. These                            polyps were removed with a cold snare. Resection                            and retrieval were complete.                           Internal hemorrhoids were found during                            retroflexion. The hemorrhoids were medium-sized and  Grade I (internal hemorrhoids that do not prolapse).                           The exam was otherwise without abnormality on                            direct and retroflexion views. Complications:            No immediate complications. Estimated blood loss:                            None. Estimated Blood Loss:     Estimated blood loss: none. Impression:               - Seven 4 to 6 mm polyps in the sigmoid colon,                            removed with a cold snare. Resected and retrieved.                           - Internal hemorrhoids.                           - The examination was otherwise normal on direct                            and retroflexion views. Recommendation:           - Repeat colonoscopy in 3 years for surveillance if                            3 or more are precancerous, otherwise 5 years.                           -  Patient has a contact number available for                            emergencies. The signs and symptoms of potential                            delayed complications were discussed with the                            patient. Return to normal activities tomorrow.                            Written discharge instructions were provided to the                            patient.                           - Resume previous diet.                           - Continue present medications.                           -  Await pathology results. Ladene Artist, MD 01/19/2016 9:32:36 AM This report has been signed electronically.

## 2016-01-19 NOTE — Progress Notes (Signed)
Called to room to assist during endoscopic procedure.  Patient ID and intended procedure confirmed with present staff. Received instructions for my participation in the procedure from the performing physician.  

## 2016-01-19 NOTE — Progress Notes (Signed)
Stable to RR 

## 2016-01-19 NOTE — Patient Instructions (Signed)
YOU HAD AN ENDOSCOPIC PROCEDURE TODAY AT [facility]:   Refer to the procedure report that was given to you for any specific questions about what was found during the examination.  If the procedure report does not answer your questions, please call your gastroenterologist to clarify.  If you requested that your care partner not be given the details of your procedure findings, then the procedure report has been included in a sealed envelope for you to review at your convenience later.  YOU SHOULD EXPECT: Some feelings of bloating in the abdomen. Passage of more gas than usual.  Walking can help get rid of the air that was put into your GI tract during the procedure and reduce the bloating. If you had a lower endoscopy (such as a colonoscopy or flexible sigmoidoscopy) you may notice spotting of blood in your stool or on the toilet paper. If you underwent a bowel prep for your procedure, you may not have a normal bowel movement for a few days.  Please Note:  You might notice some irritation and congestion in your nose or some drainage.  This is from the oxygen used during your procedure.  There is no need for concern and it should clear up in a day or so.  SYMPTOMS TO REPORT IMMEDIATELY:   Following lower endoscopy (colonoscopy or flexible sigmoidoscopy):  Excessive amounts of blood in the stool  Significant tenderness or worsening of abdominal pains  Swelling of the abdomen that is new, acute  Fever of 100F or higher  For urgent or emergent issues, a gastroenterologist can be reached at any hour by calling [phone].  DIET: Your first meal following the procedure should be a small meal and then it is ok to progress to your normal diet. Heavy or fried foods are harder to digest and may make you feel nauseous or bloated.  Likewise, meals heavy in dairy and vegetables can increase bloating.  Drink plenty of fluids but you should avoid alcoholic beverages for 24 hours.  ACTIVITY:   You should plan to take it easy for the rest of today and you should NOT DRIVE or use heavy machinery until tomorrow (because of the sedation medicines used during the test).    FOLLOW UP: Our staff will call the number listed on your records the next business day following your procedure to check on you and address any questions or concerns that you may have regarding the information given to you following your procedure. If we do not reach you, we will leave a message.  However, if you are feeling well and you are not experiencing any problems, there is no need to return our call.  We will assume that you have returned to your regular daily activities without incident.  If any biopsies were taken you will be contacted by phone or by letter within the next 1-3 weeks.  Please call us at [phone] if you have not heard about the biopsies in 3 weeks.   SIGNATURES/CONFIDENTIALITY: You and/or your care partner have signed paperwork which will be entered into your electronic medical record.  These signatures attest to the fact that that the information above on your After Visit Summary has been reviewed and is understood.  Full responsibility of the confidentiality of this discharge information lies with you and/or your care-partner.  Await pathology  Please read over handouts about polyps, hemorrhoids and high fiber diets  Continue your normal medications

## 2016-01-20 ENCOUNTER — Telehealth: Payer: Self-pay

## 2016-01-20 NOTE — Telephone Encounter (Signed)
  Follow up Call-  Call back number 01/19/2016  Post procedure Call Back phone  # [phone]  Permission to leave phone message Yes    Patient was called for follow up after her procedure on 01/19/2016. No answer at the number given for follow up phone call. A message was left on the answering machine.

## 2016-01-21 ENCOUNTER — Encounter: Payer: Self-pay | Admitting: Gastroenterology

## 2016-02-08 MED FILL — IBUPROFEN 800 MG TABLET: 800 | 4 days supply | Qty: 12 | Fill #1

## 2016-02-08 MED FILL — VALSARTAN-HCTZ 320-25 MG TA: 320-25 | 90 days supply | Qty: 90 | Fill #0

## 2016-02-08 MED FILL — VALACYCLOVIR HCL 500 MG TAB: 500 | 90 days supply | Qty: 90 | Fill #2

## 2016-02-15 MED FILL — VIT D2 1.25 MG (50,000 UNIT: 1.25 MG | 84 days supply | Qty: 12 | Fill #1

## 2016-03-22 DIAGNOSIS — I10 Essential (primary) hypertension: Secondary | ICD-10-CM | POA: Diagnosis not present

## 2016-03-22 DIAGNOSIS — G4700 Insomnia, unspecified: Secondary | ICD-10-CM | POA: Diagnosis not present

## 2016-03-22 MED FILL — TEMAZEPAM 30 MG CAPSULE: 30 | 30 days supply | Qty: 30 | Fill #0

## 2016-03-28 MED FILL — VENTOLIN HFA 90 MCG INHALER: 108 (90 BAS | 25 days supply | Qty: 18 | Fill #1

## 2016-04-06 DIAGNOSIS — L930 Discoid lupus erythematosus: Secondary | ICD-10-CM | POA: Diagnosis not present

## 2016-04-06 DIAGNOSIS — M889 Osteitis deformans of unspecified bone: Secondary | ICD-10-CM | POA: Diagnosis not present

## 2016-04-06 DIAGNOSIS — Z79899 Other long term (current) drug therapy: Secondary | ICD-10-CM | POA: Diagnosis not present

## 2016-04-06 DIAGNOSIS — M170 Bilateral primary osteoarthritis of knee: Secondary | ICD-10-CM | POA: Diagnosis not present

## 2016-04-07 DIAGNOSIS — M889 Osteitis deformans of unspecified bone: Secondary | ICD-10-CM | POA: Diagnosis not present

## 2016-04-07 DIAGNOSIS — D649 Anemia, unspecified: Secondary | ICD-10-CM | POA: Diagnosis not present

## 2016-04-15 ENCOUNTER — Ambulatory Visit (INDEPENDENT_AMBULATORY_CARE_PROVIDER_SITE_OTHER): Payer: 59 | Admitting: Obstetrics & Gynecology

## 2016-04-15 ENCOUNTER — Encounter: Payer: Self-pay | Admitting: Obstetrics & Gynecology

## 2016-04-15 VITALS — BP 124/84 | HR 82 | Resp 18 | Ht 66.5 in | Wt 304.0 lb

## 2016-04-15 DIAGNOSIS — Z01419 Encounter for gynecological examination (general) (routine) without abnormal findings: Secondary | ICD-10-CM | POA: Diagnosis not present

## 2016-04-15 DIAGNOSIS — N898 Other specified noninflammatory disorders of vagina: Secondary | ICD-10-CM

## 2016-04-15 DIAGNOSIS — R87610 Atypical squamous cells of undetermined significance on cytologic smear of cervix (ASC-US): Secondary | ICD-10-CM | POA: Diagnosis not present

## 2016-04-15 DIAGNOSIS — Z124 Encounter for screening for malignant neoplasm of cervix: Secondary | ICD-10-CM

## 2016-04-15 DIAGNOSIS — G4733 Obstructive sleep apnea (adult) (pediatric): Secondary | ICD-10-CM | POA: Diagnosis not present

## 2016-04-15 DIAGNOSIS — Z113 Encounter for screening for infections with a predominantly sexual mode of transmission: Secondary | ICD-10-CM | POA: Diagnosis not present

## 2016-04-15 MED ORDER — METRONIDAZOLE 500 MG PO TABS: 500.0000 mg | ORAL_TABLET | Freq: Two times a day (BID) | ORAL | 0 refills | Status: DC

## 2016-04-15 MED FILL — metroNIDAZOLE 500 MG TABS: 500 | 7 days supply | Qty: 14 | Fill #0

## 2016-04-15 MED FILL — CONTRAVE ER 8-90 MG TABLET: 8-90 | 28 days supply | Qty: 70 | Fill #0

## 2016-04-15 NOTE — Progress Notes (Signed)
Subjective:    Michelle Huerta is a 55 y.o. SAA P2 (38 and 24 yo kids, 8 grands) female who presents for an annual exam. The patient has no complaints today. The patient is not currently sexually active. She has a friend that she occasionally "sees".  GYN screening history: last pap: was normal. The patient wears seatbelts: yes. The patient participates in regular exercise: no. Has the patient ever been transfused or tattooed?: yes. The patient reports that there is not domestic violence in her life.   Menstrual History: OB History    No data available      Menarche age: 55 Patient's last menstrual period was 08/01/2012.    The following portions of the patient's history were reviewed and updated as appropriate: allergies, current medications, past family history, past medical history, past social history, past surgical history and problem list.  Review of Systems Pertinent items are noted in HPI.   Works for [employer], has had flu vaccine Douches occasionally Mammogram scheduled for 04-25-16 FH- no breast/gyn/colon cancer   Objective:    BP 124/84 (BP Location: Left Arm, Patient Position: Sitting, Cuff Size: Large)   Pulse 82   Resp 18   Ht 5' 6.5" (1.689 m)   Wt (!) 304 lb (137.9 kg)   LMP 08/01/2012   BMI 48.33 kg/m   General Appearance:    Alert, cooperative, no distress, appears stated age  Head:    Normocephalic, without obvious abnormality, atraumatic  Eyes:    PERRL, conjunctiva/corneas clear, EOM's intact, fundi    benign, both eyes  Ears:    Normal TM's and external ear canals, both ears  Nose:   Nares normal, septum midline, mucosa normal, no drainage    or sinus tenderness  Throat:   Lips, mucosa, and tongue normal; teeth and gums normal  Neck:   Supple, symmetrical, trachea midline, no adenopathy;    thyroid:  no enlargement/tenderness/nodules; no carotid   bruit or JVD  Back:     Symmetric, no curvature, ROM normal, no CVA tenderness  Lungs:     Clear to auscultation  bilaterally, respirations unlabored  Chest Wall:    No tenderness or deformity   Heart:    Regular rate and rhythm, S1 and S2 normal, no murmur, rub   or gallop  Breast Exam:    No tenderness, masses, or nipple abnormality  Abdomen:     Soft, non-tender, bowel sounds active all four quadrants,    no masses, no organomegaly, morbidly obese  Genitalia:    Normal female without lesion or tenderness, discharge c/w BV, no pelvic masses palpable     Extremities:   Extremities normal, atraumatic, no cyanosis or edema  Pulses:   2+ and symmetric all extremities  Skin:   Skin color, texture, turgor normal, no rashes or lesions  Lymph nodes:   Cervical, supraclavicular, and axillary nodes normal  Neurologic:   CNII-XII intact, normal strength, sensation and reflexes    throughout   .    Assessment:    Healthy female exam.   Vaginal odor   Plan:     Thin prep Pap smear. with cotesting STI testing Wet prep Flagyl prescribed

## 2016-04-16 LAB — RPR

## 2016-04-16 LAB — HEPATITIS C ANTIBODY: HCV AB: NEGATIVE

## 2016-04-16 LAB — WET PREP, GENITAL
Trich, Wet Prep: NONE SEEN
YEAST WET PREP: NONE SEEN

## 2016-04-16 LAB — HEPATITIS B SURFACE ANTIGEN: Hepatitis B Surface Ag: NEGATIVE

## 2016-04-16 LAB — HIV ANTIBODY (ROUTINE TESTING W REFLEX): HIV 1&2 Ab, 4th Generation: NONREACTIVE

## 2016-04-18 LAB — CYTOLOGY - PAP

## 2016-04-25 ENCOUNTER — Other Ambulatory Visit: Payer: Self-pay | Admitting: Family Medicine

## 2016-04-25 ENCOUNTER — Ambulatory Visit
Admission: RE | Admit: 2016-04-25 | Discharge: 2016-04-25 | Disposition: A | Discharge status: Home / Self Care | Payer: 59 | Source: Ambulatory Visit

## 2016-04-25 DIAGNOSIS — Z1231 Encounter for screening mammogram for malignant neoplasm of breast: Secondary | ICD-10-CM

## 2016-05-26 MED FILL — TEMAZEPAM 30 MG CAPSULE: 30 | 30 days supply | Qty: 30 | Fill #1

## 2016-06-20 DIAGNOSIS — Z6841 Body Mass Index (BMI) 40.0 and over, adult: Secondary | ICD-10-CM | POA: Diagnosis not present

## 2016-06-20 DIAGNOSIS — R202 Paresthesia of skin: Secondary | ICD-10-CM | POA: Diagnosis not present

## 2016-06-20 DIAGNOSIS — Z713 Dietary counseling and surveillance: Secondary | ICD-10-CM | POA: Diagnosis not present

## 2016-06-20 DIAGNOSIS — L930 Discoid lupus erythematosus: Secondary | ICD-10-CM | POA: Diagnosis not present

## 2016-06-29 ENCOUNTER — Ambulatory Visit (INDEPENDENT_AMBULATORY_CARE_PROVIDER_SITE_OTHER): Payer: 59 | Admitting: Obstetrics & Gynecology

## 2016-06-29 ENCOUNTER — Encounter: Payer: Self-pay | Admitting: Obstetrics & Gynecology

## 2016-06-29 VITALS — BP 129/85 | HR 80 | Resp 20 | Ht 66.5 in | Wt 303.0 lb

## 2016-06-29 DIAGNOSIS — D251 Intramural leiomyoma of uterus: Secondary | ICD-10-CM | POA: Diagnosis not present

## 2016-06-29 DIAGNOSIS — N950 Postmenopausal bleeding: Secondary | ICD-10-CM

## 2016-06-29 DIAGNOSIS — E6609 Other obesity due to excess calories: Secondary | ICD-10-CM | POA: Diagnosis not present

## 2016-06-29 DIAGNOSIS — IMO0001 Reserved for inherently not codable concepts without codable children: Secondary | ICD-10-CM

## 2016-06-29 MED ORDER — MISOPROSTOL 200 MCG PO TABS: ORAL_TABLET | ORAL | 0 refills | Status: DC

## 2016-06-29 MED FILL — miSOPROStol 200 MCG TABS: 200 | 1 days supply | Qty: 3 | Fill #0

## 2016-06-29 NOTE — Progress Notes (Signed)
   Subjective:    Patient ID: Michelle Huerta, female    DOB: May 20, 1961, 55 y.o.   MRN: [id number]  HPI 55 yo AA lady here for a recurrence of PMB, started this week. She had a d&c for PMB 12/16. Pathology was negative. U/s then showed many fibroids   Review of Systems Mammogram, colonoscopy and flu vaccine UTD     Objective:   Physical Exam Morbidly obese pleasant BFNAD Breathing, conversing, and ambulating normally       Assessment & Plan:  Obesity, PMB- high risk for endometrial cancer Plan for [facility] next week Cytotec prescribed

## 2016-06-30 MED FILL — VALSARTAN-HCTZ 320-25 MG TA: 320-25 | 90 days supply | Qty: 90 | Fill #1

## 2016-06-30 MED FILL — ALENDRONATE NA 70 MG TAB: 70 | 84 days supply | Qty: 12 | Fill #1

## 2016-07-01 MED FILL — VIT D2 1.25 MG (50,000 UNIT: 1.25 MG | 84 days supply | Qty: 12 | Fill #0

## 2016-07-01 MED FILL — VALACYCLOVIR HCL 500 MG TAB: 500 | 90 days supply | Qty: 90 | Fill #0

## 2016-07-07 ENCOUNTER — Ambulatory Visit (INDEPENDENT_AMBULATORY_CARE_PROVIDER_SITE_OTHER): Payer: 59 | Admitting: Obstetrics & Gynecology

## 2016-07-07 ENCOUNTER — Other Ambulatory Visit (HOSPITAL_COMMUNITY)
Admission: RE | Admit: 2016-07-07 | Discharge: 2016-07-07 | Disposition: A | Discharge status: Home / Self Care | Payer: 59 | Source: Ambulatory Visit | Attending: Obstetrics & Gynecology | Admitting: Obstetrics & Gynecology

## 2016-07-07 ENCOUNTER — Encounter: Payer: Self-pay | Admitting: Obstetrics & Gynecology

## 2016-07-07 VITALS — BP 131/86 | HR 81 | Wt 300.2 lb

## 2016-07-07 DIAGNOSIS — M329 Systemic lupus erythematosus, unspecified: Secondary | ICD-10-CM | POA: Diagnosis not present

## 2016-07-07 DIAGNOSIS — N840 Polyp of corpus uteri: Secondary | ICD-10-CM | POA: Diagnosis not present

## 2016-07-07 DIAGNOSIS — N950 Postmenopausal bleeding: Secondary | ICD-10-CM

## 2016-07-07 DIAGNOSIS — Z79899 Other long term (current) drug therapy: Secondary | ICD-10-CM | POA: Diagnosis not present

## 2016-07-07 DIAGNOSIS — H40013 Open angle with borderline findings, low risk, bilateral: Secondary | ICD-10-CM | POA: Diagnosis not present

## 2016-07-07 DIAGNOSIS — D251 Intramural leiomyoma of uterus: Secondary | ICD-10-CM

## 2016-07-07 DIAGNOSIS — H353131 Nonexudative age-related macular degeneration, bilateral, early dry stage: Secondary | ICD-10-CM | POA: Diagnosis not present

## 2016-07-07 DIAGNOSIS — IMO0001 Reserved for inherently not codable concepts without codable children: Secondary | ICD-10-CM

## 2016-07-07 NOTE — Progress Notes (Addendum)
   Subjective:    Patient ID: Michelle Huerta, female    DOB: 1960/08/30, 55 y.o.   MRN: [id number]  HPI 55 yo lady with know fibroids and PMB here today for a EMBX. She took cytotec last night. She has had a d&c in the past for the same problem (about a year ago).   Review of Systems     Objective:   Physical Exam  Pleasant morbidly obese BFNAD Breathing, conversing, and ambulating normally  UPT negative, consent signed, time out done Cervix prepped with betadine and grasped with a single tooth tenaculum Uterus sounded to 12 cm Pipelle used for 2 passes with a small amount of tissue obtained. She tolerated the procedure well.        Assessment & Plan:  PMB- await pathology

## 2016-07-08 ENCOUNTER — Ambulatory Visit: Payer: 59 | Admitting: Obstetrics & Gynecology

## 2016-08-04 MED FILL — TEMAZEPAM 30 MG CAPSULE: 30 | 30 days supply | Qty: 30 | Fill #0

## 2016-08-12 DIAGNOSIS — I10 Essential (primary) hypertension: Secondary | ICD-10-CM | POA: Insufficient documentation

## 2016-08-12 DIAGNOSIS — R42 Dizziness and giddiness: Secondary | ICD-10-CM | POA: Diagnosis not present

## 2016-08-12 MED FILL — MECLIZINE 25 MG TABLET: 25 | 12 days supply | Qty: 35 | Fill #0

## 2016-09-27 DIAGNOSIS — M545 Low back pain: Secondary | ICD-10-CM | POA: Diagnosis not present

## 2016-10-10 DIAGNOSIS — M889 Osteitis deformans of unspecified bone: Secondary | ICD-10-CM | POA: Diagnosis not present

## 2016-10-10 DIAGNOSIS — L930 Discoid lupus erythematosus: Secondary | ICD-10-CM | POA: Diagnosis not present

## 2016-10-10 DIAGNOSIS — M170 Bilateral primary osteoarthritis of knee: Secondary | ICD-10-CM | POA: Diagnosis not present

## 2016-10-10 DIAGNOSIS — Z79899 Other long term (current) drug therapy: Secondary | ICD-10-CM | POA: Diagnosis not present

## 2016-10-24 ENCOUNTER — Ambulatory Visit (INDEPENDENT_AMBULATORY_CARE_PROVIDER_SITE_OTHER): Payer: 59 | Admitting: Urgent Care

## 2016-10-24 ENCOUNTER — Ambulatory Visit (INDEPENDENT_AMBULATORY_CARE_PROVIDER_SITE_OTHER): Payer: 59

## 2016-10-24 VITALS — BP 126/83 | HR 80 | Temp 98.1°F | Resp 17 | Ht 67.0 in | Wt 305.0 lb

## 2016-10-24 DIAGNOSIS — M79652 Pain in left thigh: Secondary | ICD-10-CM

## 2016-10-24 DIAGNOSIS — D1621 Benign neoplasm of long bones of right lower limb: Secondary | ICD-10-CM | POA: Diagnosis not present

## 2016-10-24 DIAGNOSIS — M79651 Pain in right thigh: Secondary | ICD-10-CM

## 2016-10-24 DIAGNOSIS — M329 Systemic lupus erythematosus, unspecified: Secondary | ICD-10-CM | POA: Diagnosis not present

## 2016-10-24 DIAGNOSIS — M889 Osteitis deformans of unspecified bone: Secondary | ICD-10-CM

## 2016-10-24 DIAGNOSIS — M810 Age-related osteoporosis without current pathological fracture: Secondary | ICD-10-CM | POA: Diagnosis not present

## 2016-10-24 DIAGNOSIS — R635 Abnormal weight gain: Secondary | ICD-10-CM | POA: Diagnosis not present

## 2016-10-24 DIAGNOSIS — N951 Menopausal and female climacteric states: Secondary | ICD-10-CM | POA: Diagnosis not present

## 2016-10-24 LAB — POCT URINALYSIS DIP (MANUAL ENTRY)
BILIRUBIN UA: NEGATIVE
BILIRUBIN UA: NEGATIVE mg/dL
Blood, UA: NEGATIVE
Glucose, UA: NEGATIVE mg/dL
Leukocytes, UA: NEGATIVE
Nitrite, UA: NEGATIVE
PROTEIN UA: NEGATIVE mg/dL
SPEC GRAV UA: 1.02 (ref 1.010–1.025)
Urobilinogen, UA: 2 E.U./dL — AB
pH, UA: 6 (ref 5.0–8.0)

## 2016-10-24 NOTE — Progress Notes (Signed)
MRN: [id number] DOB: [birth year]  Subjective:   Michelle Huerta is a 56 y.o. female presenting for chief complaint of Leg Pain (of unknown origin )  Reports 2 week history of intermittent bilateral thigh pain. Pain is throbbing, worsened with walking, has cramping type sensation over the back of her thighs. Also has intermittent knee pain. Has tried diclofenac with minimal relief. Takes Fosamax for osteoporosis. Has SLE, managed by rheumatology, just had her f/u visit in the past month without any new findings. Next follow up is due in 6 months. Was recently started on hydroxychloroquine per patient. However she states that she is not always compliant with this. Has a history of Paget disease. Has 2 bottles of water daily. 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 Is very active, stands on her feet and walks daily. Denies smoking cigarettes. Denies history of PAD.  Michelle Huerta has a current medication list which includes the following prescription(s): alendronate, cyclobenzaprine, diclofenac, hydroxychloroquine, misoprostol, OVER THE COUNTER MEDICATION, valacyclovir, valsartan-hydrochlorothiazide, and vitamin d (ergocalciferol). Also is allergic to reclast [zoledronic acid] and penicillins. Michelle Huerta  has a past medical history of Allergy; Asthma; Bilateral chronic knee pain; Chronic back pain; DDD (degenerative disc disease), cervical; Depression; Genital herpes; Glaucoma; Heart murmur; Hypertension; Insomnia; Lupus; Mild sleep apnea; Morbid obesity ([place]) (06/11/2015); Osteoporosis; Paget's disease; Paget's disease of bone; Sleep apnea; Thrombocytopenia ([place]); Tubular adenoma polyp of rectum; and Vitamin D deficiency. Also  has a past surgical history that includes Tubal ligation; Breast enhancement surgery; Breast reduction surgery; Toe Surgery; and Dilation and curettage of uterus (N/A, 06/24/2015).  Objective:   Vitals: BP 126/83   Pulse 80   Temp 98.1 F (36.7 C) (Oral)   Resp 17   Ht 5\' 7"  (1.702 m)   Wt (!)  305 lb (138.3 kg)   LMP 08/01/2012   SpO2 97%   BMI 47.77 kg/m   Physical Exam  Constitutional: She is oriented to person, place, and time. She appears well-developed and well-nourished.  Cardiovascular: Normal rate.   Pulmonary/Chest: Effort normal.  Musculoskeletal:       Right hip: She exhibits normal range of motion, normal strength, no tenderness, no bony tenderness, no swelling, no crepitus, no deformity and no laceration.       Left hip: She exhibits normal range of motion, normal strength, no tenderness, no bony tenderness, no swelling, no crepitus, no deformity and no laceration.       Right knee: She exhibits normal range of motion, no swelling, no effusion, no ecchymosis, no deformity, no laceration, no erythema, normal alignment and normal patellar mobility. No tenderness found.       Left knee: She exhibits normal range of motion, no swelling, no effusion, no ecchymosis, no deformity, no laceration, no erythema, normal alignment and normal patellar mobility. No tenderness found.       Lumbar back: She exhibits normal range of motion, no tenderness, no bony tenderness, no swelling, no edema, no deformity and no spasm.       Right upper leg: She exhibits no tenderness, no bony tenderness, no swelling, no edema, no deformity and no laceration.       Left upper leg: She exhibits no tenderness, no bony tenderness, no swelling, no edema, no deformity and no laceration.  Neurological: She is alert and oriented to person, place, and time.   Results for orders placed or performed in visit on 10/24/16 (from the past 24 hour(s))  POCT urinalysis dipstick     Status: Abnormal   Collection  Time: 10/24/16  1:42 PM  Result Value Ref Range   Color, UA yellow yellow   Clarity, UA clear clear   Glucose, UA negative negative mg/dL   Bilirubin, UA negative negative   Ketones, POC UA negative negative mg/dL   Spec Grav, UA 1.020 1.010 - 1.025   Blood, UA negative negative   pH, UA 6.0 5.0 -  8.0   Protein Ur, POC negative negative mg/dL   Urobilinogen, UA 2.0 (A) 0.2 or 1.0 E.U./dL   Nitrite, UA Negative Negative   Leukocytes, UA Negative Negative   Dg Femur Min 2 Views Left  Result Date: 10/24/2016 CLINICAL DATA:  Thigh pain. EXAM: LEFT FEMUR 2 VIEWS COMPARISON:  None. FINDINGS: There is no evidence of fracture or other focal bone lesions. Soft tissues are unremarkable. IMPRESSION: Negative. Electronically Signed   By: Misty Stanley M.D.   On: 10/24/2016 14:12   Dg Femur, Min 2 Views Right  Result Date: 10/24/2016 CLINICAL DATA:  Bilateral thigh pain.  History of Paget's disease. EXAM: RIGHT FEMUR 2 VIEWS COMPARISON:  Pelvis radiography 04/06/2016 and 02/01/2010 FINDINGS: There is no evidence of fracture or other focal bone lesion. Soft tissues are unremarkable.Partly visualized right hemipelvis shows coarsened trabecular markings and bony expansion, chronic findings since 2011 at least. No visualized superimposed aggressive/ destructive process. 26 mm long medullary sclerosis in the distal femoral diaphysis. This area was not covered on right knee film from 2013. No periosteal reaction, fracture lucency or evidence of soft tissue mass. IMPRESSION: 1. No acute finding. 2. 26 mm medullary sclerosis in the distal femoral diaphysis, likely chondroid/enchondroma. 3. Partly seen bony pelvis.  Known Paget's disease. Electronically Signed   By: Monte Fantasia M.D.   On: 10/24/2016 14:15     Assessment and Plan :   1. Bilateral thigh pain 2. Osteoporosis without current pathological fracture, unspecified osteoporosis type 3. Systemic lupus erythematosus, unspecified SLE type, unspecified organ involvement status ([place]) 4. Paget's disease of bone 5. Enchondroma of femur, right - Patient likely has multiple contributing sources, likely most affected by her Paget's disease and osteoporosis, lack of hydration. Will f/u with labs. Patient is to schedule f/u with her rheumatologist. I will  refer back to her oncologist.  Jaynee Eagles, PA-C [facility] [phone] 10/24/2016  1:05 PM

## 2016-10-24 NOTE — Patient Instructions (Addendum)
Please make sure you hydrate very well with at least 2 liters (1 gallon) of water daily. Contact your rheumatologist to discuss the possibility of side effects with Plaquenil.     IF you received an x-ray today, you will receive an invoice from [facility]. Please contact [facility] at [phone] with questions or concerns regarding your invoice.   IF you received labwork today, you will receive an invoice from [facility]. Please contact [facility] at [phone] with questions or concerns regarding your invoice.   Our billing staff will not be able to assist you with questions regarding bills from these companies.  You will be contacted with the lab results as soon as they are available. The fastest way to get your results is to activate your My Chart account. Instructions are located on the last page of this paperwork. If you have not heard from [place] regarding the results in 2 weeks, please contact this office.

## 2016-10-25 LAB — COMPREHENSIVE METABOLIC PANEL
A/G RATIO: 1.1 — AB (ref 1.2–2.2)
ALT: 32 IU/L (ref 0–32)
AST: 26 IU/L (ref 0–40)
Albumin: 4 g/dL (ref 3.5–5.5)
Alkaline Phosphatase: 82 IU/L (ref 39–117)
BILIRUBIN TOTAL: 0.4 mg/dL (ref 0.0–1.2)
BUN/Creatinine Ratio: 20 (ref 9–23)
BUN: 15 mg/dL (ref 6–24)
CALCIUM: 9.2 mg/dL (ref 8.7–10.2)
CHLORIDE: 101 mmol/L (ref 96–106)
CO2: 26 mmol/L (ref 18–29)
Creatinine, Ser: 0.74 mg/dL (ref 0.57–1.00)
GFR calc Af Amer: 106 mL/min/{1.73_m2} (ref >59)
GFR, EST NON AFRICAN AMERICAN: 92 mL/min/{1.73_m2} (ref >59)
GLOBULIN, TOTAL: 3.8 g/dL (ref 1.5–4.5)
Glucose: 87 mg/dL (ref 65–99)
POTASSIUM: 4.1 mmol/L (ref 3.5–5.2)
SODIUM: 141 mmol/L (ref 134–144)
Total Protein: 7.8 g/dL (ref 6.0–8.5)

## 2016-10-25 LAB — CK: Total CK: 133 U/L (ref 24–173)

## 2016-10-25 LAB — SEDIMENTATION RATE: SED RATE: 10 mm/h (ref 0–40)

## 2016-11-02 ENCOUNTER — Ambulatory Visit (INDEPENDENT_AMBULATORY_CARE_PROVIDER_SITE_OTHER): Payer: 59

## 2016-11-02 ENCOUNTER — Ambulatory Visit (INDEPENDENT_AMBULATORY_CARE_PROVIDER_SITE_OTHER): Payer: 59 | Admitting: Urgent Care

## 2016-11-02 VITALS — BP 139/80 | HR 81 | Temp 98.6°F | Resp 16 | Ht 67.0 in | Wt 306.4 lb

## 2016-11-02 DIAGNOSIS — M889 Osteitis deformans of unspecified bone: Secondary | ICD-10-CM

## 2016-11-02 DIAGNOSIS — M79605 Pain in left leg: Secondary | ICD-10-CM | POA: Diagnosis not present

## 2016-11-02 DIAGNOSIS — M79604 Pain in right leg: Secondary | ICD-10-CM

## 2016-11-02 DIAGNOSIS — E539 Vitamin B deficiency, unspecified: Secondary | ICD-10-CM | POA: Diagnosis not present

## 2016-11-02 DIAGNOSIS — R7303 Prediabetes: Secondary | ICD-10-CM | POA: Diagnosis not present

## 2016-11-02 DIAGNOSIS — E559 Vitamin D deficiency, unspecified: Secondary | ICD-10-CM | POA: Diagnosis not present

## 2016-11-02 DIAGNOSIS — D1621 Benign neoplasm of long bones of right lower limb: Secondary | ICD-10-CM

## 2016-11-02 DIAGNOSIS — N951 Menopausal and female climacteric states: Secondary | ICD-10-CM | POA: Diagnosis not present

## 2016-11-02 DIAGNOSIS — M79661 Pain in right lower leg: Secondary | ICD-10-CM | POA: Diagnosis not present

## 2016-11-02 DIAGNOSIS — M79662 Pain in left lower leg: Secondary | ICD-10-CM | POA: Diagnosis not present

## 2016-11-02 DIAGNOSIS — I10 Essential (primary) hypertension: Secondary | ICD-10-CM | POA: Diagnosis not present

## 2016-11-02 MED ORDER — DICLOFENAC SODIUM 75 MG PO TBEC: 75.0000 mg | DELAYED_RELEASE_TABLET | Freq: Every day | ORAL | 5 refills | Status: DC

## 2016-11-02 MED FILL — VALACYCLOVIR HCL 500 MG TAB: 500 | 90 days supply | Qty: 90 | Fill #1

## 2016-11-02 MED FILL — VALSARTAN-HCTZ 320-25 MG TA: 320-25 | 90 days supply | Qty: 90 | Fill #0

## 2016-11-02 MED FILL — DICLOFENAC SOD 75 MG TAB EC: 75 | 30 days supply | Qty: 30 | Fill #0

## 2016-11-02 MED FILL — ALENDRONATE NA 70 MG TAB: 70 | 84 days supply | Qty: 12 | Fill #2

## 2016-11-02 MED FILL — TEMAZEPAM 30 MG CAPSULE: 30 | 30 days supply | Qty: 30 | Fill #0

## 2016-11-02 NOTE — Progress Notes (Signed)
MRN: [id number] DOB: [birth year]  Subjective:   Michelle Huerta is a 56 y.o. female presenting for follow up on bilateral leg pain. At her last OV, she was found to have "26 mm medullary sclerosis in the distal femoral diaphysis, likely chondroid/enchondroma" of her right femur. Results were reported by phone. She has known Paget's disease, Now reports that her leg pain is in her right lower extremity with standing, walking, worst pain happens when she stands from a sitting position. Denies redness, warmth, swelling. Denies chest pain, heart racing, shob, fever. Her rheumatologist is Dr. Lahoma Rocker. She has not been taking Plaquenil consistently for the past 3 months.   Michelle Huerta has a current medication list which includes the following prescription(s): alendronate, diclofenac, valacyclovir, valsartan-hydrochlorothiazide, cyclobenzaprine, hydroxychloroquine, misoprostol, OVER THE COUNTER MEDICATION, and vitamin d (ergocalciferol). Also is allergic to reclast [zoledronic acid] and penicillins.  Michelle Huerta  has a past medical history of Allergy; Asthma; Bilateral chronic knee pain; Chronic back pain; DDD (degenerative disc disease), cervical; Depression; Genital herpes; Glaucoma; Heart murmur; Hypertension; Insomnia; Lupus; Mild sleep apnea; Morbid obesity ([place]) (06/11/2015); Osteoporosis; Paget's disease; Paget's disease of bone; Sleep apnea; Thrombocytopenia ([place]); Tubular adenoma polyp of rectum; and Vitamin D deficiency. Also  has a past surgical history that includes Tubal ligation; Breast enhancement surgery; Breast reduction surgery; Toe Surgery; and Dilation and curettage of uterus (N/A, 06/24/2015).  Objective:   Vitals: BP 139/80 (BP Location: Right Arm, Patient Position: Sitting, Cuff Size: Large)   Pulse 81   Temp 98.6 F (37 C) (Oral)   Resp 16   Ht 5\' 7"  (1.702 m)   Wt (!) 306 lb 6.4 oz (139 kg)   LMP 08/01/2012   SpO2 98%   BMI 47.99 kg/m   Physical Exam  Constitutional: She is oriented  to person, place, and time. She appears well-developed and well-nourished.  HENT:  Mouth/Throat: Oropharynx is clear and moist.  Eyes: No scleral icterus.  Cardiovascular: Normal rate, regular rhythm and intact distal pulses.  Exam reveals no gallop and no friction rub.   No murmur heard. Pulmonary/Chest: No respiratory distress. She has no wheezes. She has no rales.  Musculoskeletal:       Right lower leg: She exhibits no tenderness, no bony tenderness, no swelling, no edema, no deformity and no laceration.       Left lower leg: She exhibits no tenderness, no bony tenderness, no swelling, no edema, no deformity and no laceration.  Negative Homann sign.  Neurological: She is alert and oriented to person, place, and time.  Skin: Skin is warm and dry. Capillary refill takes less than 2 seconds.  Psychiatric:  Flat affect.   Dg Tibia/fibula Left  Result Date: 11/02/2016 CLINICAL DATA:  History of Paget's disease, bilateral lower leg pain EXAM: LEFT TIBIA AND FIBULA - 2 VIEW COMPARISON:  Left knee films of 01/25/2012 FINDINGS: The left tibia and fibula are in normal alignment. No acute abnormality is seen. No periosteal reaction is noted. The portion of the left knee joint and left ankle joint that is visualized is unremarkable. IMPRESSION: Negative. Electronically Signed   By: Ivar Drape M.D.   On: 11/02/2016 09:24   Dg Tibia/fibula Right  Result Date: 11/02/2016 CLINICAL DATA:  History of Paget's disease, bilateral lower leg pain EXAM: RIGHT TIBIA AND FIBULA - 2 VIEW COMPARISON:  Right knee films of 05/01/2013 and 01/25/2012 FINDINGS: The right tibia and fibula are in normal alignment. No acute fracture is seen. No periosteal reaction is noted.  A small bony density overlying the right knee joint space again is noted which could represent a small loose body. The right ankle joint is unremarkable. IMPRESSION: No acute abnormality. Cannot exclude a small loose body in the anterior right knee joint  space. Electronically Signed   By: Ivar Drape M.D.   On: 11/02/2016 09:23   Assessment and Plan :   1. Paget's disease of bone 2. Enchondroma of femur, right 3. Bilateral lower extremity pain - Recommended patient use APAP or diclofenac for pain and inflammation. Referral to rheumatology is pending.  Jaynee Eagles, PA-C [facility] [phone] 11/02/2016 8:52 AM

## 2016-11-02 NOTE — Patient Instructions (Addendum)
Paget Disease of Bone Paget disease is a condition that makes the bones grow faster than normal. This leads to having bones that are larger and weaker than normal. Healthy bones rebuild themselves by destroying old bone and replacing it with new bone tissue. This normal process usually slows down as a person gets older. If you have Paget disease, the process speeds up instead. As a result, your bones become weaker and larger. The new bone tissue may have more blood vessels. These changes can cause the bone to have an abnormal shape (deformity). These bones may bend or break more easily than healthy bones. Paget disease may affect just a few bones or bones all over the body. Bones in the arms, legs, spine, pelvis, and skull are often affected. What are the causes? The cause of this condition is not known. What increases the risk? This condition is more likely to develop in:  People who have a family history of the disease.  People who are age 56 or older.  Males.  People of European descent. What are the signs or symptoms? Symptoms of this condition include:  Bone pain.  Neck pain.  Headache.  Joint pain or stiffness.  Tingling or numbness.  Bowed legs.  Bones that break easily.  A feeling of warmth in areas of skin that are over the affected bone.  Loss of height.  Changes in the shape of the skull or other bones.  Hearing loss, if the bones of the skull are affected. In some cases, there are no symptoms. How is this diagnosed? This condition may be diagnosed based on:  A physical exam and medical history.  Blood and urine tests to check whether the levels of calcium and alkaline phosphatase are higher than normal. These substances may increase with abnormal bone growth.  Imaging studies, such as X-rays and bone scans. How is this treated? Treatment for this condition depends on symptoms and the area of the body that is affected. If you have no symptoms, you may not  need any treatment. You may get treatment if the condition is causing symptoms, affecting your skull, or putting you at risk for a bone fracture. The goals of treatment are to relieve bone pain and to prevent the condition from getting worse. Treatment may include:  Medicines, such as:  NSAIDs for pain.  Bisphosphonates to slow down bone growth and reduce pain. You may have to take this medicine for several months. It is usually taken by mouth but can also be received through an IV tube.  A chemical messenger (hormone) that is called calcitonin. This hormone slows down bone activity. It is given as an injection under the skin.  Surgery to treat problems that are caused by the disease. Surgery is rarely used. Follow these instructions at home:  Take medicines only as directed by your health care provider.  If you are taking a bisphosphonate by mouth, make sure to:  Take the medicine with a large glass of water.  Take it in the morning.  Wait 30 minutes to eat or drink anything after taking the medicine.  Stay upright for 30 minutes after taking the medicine.  Keep all follow-up visits as directed by your health care provider. This is important. Contact a health care provider if:  You develop bone pain or joint pain.  Your bone pain or joint pain gets worse.  You have a hearing loss.  You have swelling in your legs or ankles. Get help right away if:  You think that you have a broken bone.  You have shortness of breath or you have trouble breathing.  You have chest pain. This information is not intended to replace advice given to you by your health care provider. Make sure you discuss any questions you have with your health care provider. Document Released: 12/17/2001 Document Revised: 12/03/2015 Document Reviewed: 06/25/2014 Elsevier Interactive Patient Education  2017 [employer].     IF you received an x-ray today, you will receive an invoice from [facility]  Radiology. Please contact [facility] at [phone] with questions or concerns regarding your invoice.   IF you received labwork today, you will receive an invoice from [facility]. Please contact [facility] at [phone] with questions or concerns regarding your invoice.   Our billing staff will not be able to assist you with questions regarding bills from these companies.  You will be contacted with the lab results as soon as they are available. The fastest way to get your results is to activate your My Chart account. Instructions are located on the last page of this paperwork. If you have not heard from [place] regarding the results in 2 weeks, please contact this office.

## 2016-11-07 DIAGNOSIS — R7303 Prediabetes: Secondary | ICD-10-CM | POA: Diagnosis not present

## 2016-11-07 DIAGNOSIS — E539 Vitamin B deficiency, unspecified: Secondary | ICD-10-CM | POA: Diagnosis not present

## 2016-11-07 DIAGNOSIS — I10 Essential (primary) hypertension: Secondary | ICD-10-CM | POA: Diagnosis not present

## 2016-11-14 DIAGNOSIS — Z79899 Other long term (current) drug therapy: Secondary | ICD-10-CM | POA: Diagnosis not present

## 2016-11-14 DIAGNOSIS — R7303 Prediabetes: Secondary | ICD-10-CM | POA: Diagnosis not present

## 2016-11-14 DIAGNOSIS — M889 Osteitis deformans of unspecified bone: Secondary | ICD-10-CM | POA: Diagnosis not present

## 2016-11-14 DIAGNOSIS — L930 Discoid lupus erythematosus: Secondary | ICD-10-CM | POA: Diagnosis not present

## 2016-11-14 DIAGNOSIS — M170 Bilateral primary osteoarthritis of knee: Secondary | ICD-10-CM | POA: Diagnosis not present

## 2016-11-14 DIAGNOSIS — I10 Essential (primary) hypertension: Secondary | ICD-10-CM | POA: Diagnosis not present

## 2016-11-14 DIAGNOSIS — R5383 Other fatigue: Secondary | ICD-10-CM | POA: Diagnosis not present

## 2016-11-28 DIAGNOSIS — R7303 Prediabetes: Secondary | ICD-10-CM | POA: Diagnosis not present

## 2016-11-28 DIAGNOSIS — I10 Essential (primary) hypertension: Secondary | ICD-10-CM | POA: Diagnosis not present

## 2016-12-28 DIAGNOSIS — Z79899 Other long term (current) drug therapy: Secondary | ICD-10-CM | POA: Diagnosis not present

## 2016-12-28 DIAGNOSIS — M5136 Other intervertebral disc degeneration, lumbar region: Secondary | ICD-10-CM | POA: Diagnosis not present

## 2016-12-28 DIAGNOSIS — M25569 Pain in unspecified knee: Secondary | ICD-10-CM | POA: Diagnosis not present

## 2016-12-28 DIAGNOSIS — R7303 Prediabetes: Secondary | ICD-10-CM | POA: Diagnosis not present

## 2016-12-28 DIAGNOSIS — L930 Discoid lupus erythematosus: Secondary | ICD-10-CM | POA: Diagnosis not present

## 2016-12-28 DIAGNOSIS — I10 Essential (primary) hypertension: Secondary | ICD-10-CM | POA: Diagnosis not present

## 2016-12-28 DIAGNOSIS — M170 Bilateral primary osteoarthritis of knee: Secondary | ICD-10-CM | POA: Diagnosis not present

## 2016-12-28 DIAGNOSIS — M25561 Pain in right knee: Secondary | ICD-10-CM | POA: Diagnosis not present

## 2016-12-28 DIAGNOSIS — M25562 Pain in left knee: Secondary | ICD-10-CM | POA: Diagnosis not present

## 2016-12-28 DIAGNOSIS — E559 Vitamin D deficiency, unspecified: Secondary | ICD-10-CM | POA: Diagnosis not present

## 2016-12-28 DIAGNOSIS — M889 Osteitis deformans of unspecified bone: Secondary | ICD-10-CM | POA: Diagnosis not present

## 2017-01-02 DIAGNOSIS — R7303 Prediabetes: Secondary | ICD-10-CM | POA: Diagnosis not present

## 2017-01-06 DIAGNOSIS — M170 Bilateral primary osteoarthritis of knee: Secondary | ICD-10-CM | POA: Diagnosis not present

## 2017-01-06 DIAGNOSIS — L930 Discoid lupus erythematosus: Secondary | ICD-10-CM | POA: Diagnosis not present

## 2017-01-06 DIAGNOSIS — M25569 Pain in unspecified knee: Secondary | ICD-10-CM | POA: Diagnosis not present

## 2017-01-06 DIAGNOSIS — M889 Osteitis deformans of unspecified bone: Secondary | ICD-10-CM | POA: Diagnosis not present

## 2017-01-06 DIAGNOSIS — Z79899 Other long term (current) drug therapy: Secondary | ICD-10-CM | POA: Diagnosis not present

## 2017-01-10 ENCOUNTER — Encounter: Payer: Self-pay | Admitting: *Deleted

## 2017-01-14 NOTE — Progress Notes (Signed)
Cardiology Office Note   Date:  01/16/2017   ID:  Michelle Huerta, DOB 08-02-1960, MRN [id number]  PCP:  Harlan Stains, MD  Cardiologist:   Skeet Latch, MD   Chief Complaint  Patient presents with  . Follow-up    History of Present Illness: Michelle Huerta is a 56 y.o. female with hypertension, morbid obesity, Lupus, and Paget's disease who presents follow up.  Michelle Huerta was initially seen 06/2015 for evaluation prior to starting Belviq.  At that appointment Michelle Huerta reported symptoms concerning for OSA and was referred for a sleep study.  Michelle Huerta was found to have OSA and started on a CPAP.  Michelle Huerta was referred for an echo 06/2015 that revealed LVEF 55-60% with grade 1 diastolic dysfunction. Since her last appointment Mr. Inglett has been doing well. Michelle Huerta denies chest pain or shortness of breath. Michelle Huerta also denies lower extremity edema, orthopnea, or PND. Michelle Huerta notes that Michelle Huerta is not using her CPAP regularly. This is because the nasal pillows irritate her nose and make her dry.  Michelle Huerta admits to not using it every night. It is hard for her to sleep with the machine on. Her main complaint today is pain in her right leg. 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  Michelle Huerta also has pain in bilateral knees. Michelle Huerta gets steroid injections regularly, which helps. Michelle Huerta is unable to exercise. 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  Michelle Huerta continues to participate in the [facility] weight loss program and has lost 20lb in the last 3 months.  Michelle Huerta follows a 1000 calorie/day diet.    Past Medical History:  Diagnosis Date  . Allergy   . Asthma    no medicines  . Bilateral chronic knee pain   . Chronic back pain   . DDD (degenerative disc disease), cervical   . Depression    no meds  . Genital herpes    HSV type 2 at rectum, culture positive 3/15  . Glaucoma   . Heart murmur   . Hypertension   . Insomnia   . Lupus   . Mild  sleep apnea    1/17 CPAP Dr Claiborne Billings  . Morbid obesity ([place]) 06/11/2015  . Osteoporosis   . Paget's disease   . Paget's disease of bone    Dr Trudie Reed, Reclast insufion 02/14/10 - bad reaction to infusion, now tolerates fosamax weekly  . Sleep apnea   . Thrombocytopenia ([place])   . Tubular adenoma polyp of rectum    8/12, Dr Fuller Plan, 5 yr follow up   . Vitamin D deficiency     Past Surgical History:  Procedure Laterality Date  . BREAST ENHANCEMENT SURGERY    . BREAST REDUCTION SURGERY    . DILATION AND CURETTAGE OF UTERUS N/A 06/24/2015   Procedure: DILATATION AND CURETTAGE;  Surgeon: Emily Filbert, MD;  Location: [facility];  Service: Gynecology;  Laterality: N/A;  . TOE SURGERY     removal of bone in each foot  . TUBAL LIGATION       Current Outpatient Prescriptions  Medication Sig Dispense Refill  . alendronate (FOSAMAX) 70 MG tablet Take 70 mg by mouth every 7 (seven) days. On Monday  -  Take with a full glass of water on an empty stomach.    . cyclobenzaprine (FLEXERIL) 10 MG tablet Take 10 mg by mouth daily as needed.  1  . diclofenac (VOLTAREN) 75 MG EC tablet Take 1 tablet (75 mg total) by mouth daily. 30 tablet 5  . hydroxychloroquine (PLAQUENIL) 200 MG tablet Take 200 mg by mouth daily. Reported on 08/07/2015    . misoprostol (CYTOTEC) 200 MCG tablet Take 3 pills by mouth the night before biopsy. 3 tablet 0  . OVER THE COUNTER MEDICATION     . valACYclovir (VALTREX) 500 MG tablet Take 500 mg by mouth daily.    . valsartan-hydrochlorothiazide (DIOVAN-HCT) 320-25 MG per tablet Take 1 tablet by mouth daily.    . Vitamin D, Ergocalciferol, (DRISDOL) 50000 units CAPS capsule Take 50,000 Units by mouth every Monday.  0   No current facility-administered medications for this visit.     Allergies:   Penicillins and Reclast [zoledronic acid]    Social History:  The patient  reports that Michelle Huerta quit smoking about 8 years ago. Her smoking use included Cigars. Michelle Huerta quit after 7.00 years of use.  Michelle Huerta has never used smokeless tobacco. Michelle Huerta reports that Michelle Huerta drinks about 3.0 oz of alcohol per week . Michelle Huerta reports that Michelle Huerta does not use drugs.   Family History:  The patient's family history includes Cancer in her maternal grandmother; Heart disease in her mother; Hypertension in her mother; Stomach cancer in her paternal grandmother.    ROS:  Please see the history of present illness.   Otherwise, review of systems are positive for none.   All other systems are reviewed and negative.    PHYSICAL EXAM: VS:  BP 102/68   Pulse 75   Ht 5\' 7"  (1.702 m)   Wt 129.7 kg (286 lb)   LMP 08/01/2012   BMI 44.79 kg/m  , BMI Body mass index is 44.79 kg/m. GENERAL:  Well appearing.  No acute distress.  HEENT:  Pupils equal round and reactive, fundi not visualized, oral mucosa unremarkable NECK:  No jugular venous distention, waveform within normal limits, carotid upstroke brisk and symmetric, no bruits LUNGS:  Clear to auscultation bilaterally.  No crackles, wheezes or rhonci HEART:  RRR.  PMI not displaced or sustained,S1 and S2 within normal limits, no S3, no S4, no clicks, no rubs, no murmurs ABD:  Flat, positive bowel sounds normal in frequency in pitch, no bruits, no rebound, no guarding, no midline pulsatile mass, no hepatomegaly, no splenomegaly.  Obese EXT:  2 plus pulses throughout, no edema, no cyanosis no clubbing SKIN:  No rashes no nodules NEURO:  Cranial nerves II through XII grossly intact, motor grossly intact throughout PSYCH:  Cognitively intact, oriented to person place and time   EKG:  EKG is ordered today. The ekg ordered 06/11/15 demonstrates sinus rhythm rate 83 bpm. Early R wave progression. 01/16/17: Sinus rhythm. Rate 75 bpm.  Echo 06/25/15: Study Conclusions  - Left ventricle: The cavity size was normal. Wall thickness was   increased in a pattern of mild LVH. Systolic function was normal.   The estimated ejection fraction was in the range of 55% to 60%.   Regional  wall motion abnormalities cannot be excluded. Doppler   parameters are consistent with abnormal left ventricular   relaxation (grade 1 diastolic dysfunction).  Impressions:  - Technically difficult; normal LV systolic function; grade 1   diastolic dysfunction; mild LVH; trace TR.  Recent Labs: 10/24/2016: ALT 32; BUN 15; Creatinine, Ser 0.74; Potassium 4.1; Sodium 141    Lipid Panel No results found for: CHOL, TRIG, HDL, CHOLHDL, VLDL, LDLCALC, LDLDIRECT 03/25/15: WBC 5.5, hematocrit 36.7,  platelets 138 Sodium 141, potassium 3.7, BUN 15, creatinine 0.79 AST 17 ALT 18 TSH 1.33 Total cholesterol 150, triglycerides 95, HDL 70, LDL61  Wt Readings from Last 3 Encounters:  01/16/17 129.7 kg (286 lb)  11/02/16 (!) 139 kg (306 lb 6.4 oz)  10/24/16 (!) 138.3 kg (305 lb)      ASSESSMENT AND PLAN:  # Morbid obesity: Michelle Huerta was congratulated on her weight loss efforts. Michelle Huerta has lost 20 pounds in the last 3 months. Michelle Huerta was encouraged to continue her dietary changes. Michelle Huerta is limited on exercise due to knee pain but hopes to start back exercising after getting her next knee injection. LDL was 61 03/2016 without medications.  Repeat next year.  # OSA: Michelle Huerta hasn't been able to tolerate her CPAP well.  We will send a message to Dr. Evette Georges nurse to see if Michelle Huerta can tolerate other options.  # Hypertension: Blood pressure well-controlled on valsartan/hydrochlorothiazide.  Current medicines are reviewed at length with the patient today.  The patient does not have concerns regarding medicines.  The following changes have been made:  no change  Labs/ tests ordered today include:   No orders of the defined types were placed in this encounter.    Disposition:   FU with Nyari Olsson C. Oval Linsey, MD, [facility] in 1 year.   Signed, Verla Bryngelson C. Oval Linsey, MD, [facility]  01/16/2017 10:05 AM    [place]

## 2017-01-16 ENCOUNTER — Ambulatory Visit (INDEPENDENT_AMBULATORY_CARE_PROVIDER_SITE_OTHER): Payer: 59 | Admitting: Cardiovascular Disease

## 2017-01-16 DIAGNOSIS — M889 Osteitis deformans of unspecified bone: Secondary | ICD-10-CM | POA: Diagnosis not present

## 2017-01-16 DIAGNOSIS — I10 Essential (primary) hypertension: Secondary | ICD-10-CM

## 2017-01-16 DIAGNOSIS — M25569 Pain in unspecified knee: Secondary | ICD-10-CM | POA: Diagnosis not present

## 2017-01-16 DIAGNOSIS — M170 Bilateral primary osteoarthritis of knee: Secondary | ICD-10-CM | POA: Diagnosis not present

## 2017-01-16 DIAGNOSIS — E559 Vitamin D deficiency, unspecified: Secondary | ICD-10-CM | POA: Diagnosis not present

## 2017-01-16 DIAGNOSIS — R7303 Prediabetes: Secondary | ICD-10-CM | POA: Diagnosis not present

## 2017-01-16 DIAGNOSIS — L930 Discoid lupus erythematosus: Secondary | ICD-10-CM | POA: Diagnosis not present

## 2017-01-16 DIAGNOSIS — G4733 Obstructive sleep apnea (adult) (pediatric): Secondary | ICD-10-CM

## 2017-01-16 DIAGNOSIS — Z9989 Dependence on other enabling machines and devices: Secondary | ICD-10-CM

## 2017-01-16 NOTE — Patient Instructions (Signed)
Medication Instructions:  Your physician recommends that you continue on your current medications as directed. Please refer to the Current Medication list given to you today.  Labwork: noe  Testing/Procedures: none  Follow-Up: Your physician wants you to follow-up in: 1 year ov  You will receive a reminder letter in the mail two months in advance. If you don't receive a letter, please call our office to schedule the follow-up appointment.  Any Other Special Instructions Will Be Listed Below (If Applicable). Will have someone contact you regarding your CPAP issues  If you need a refill on your cardiac medications before your next appointment, please call your pharmacy.

## 2017-01-24 ENCOUNTER — Other Ambulatory Visit: Payer: Self-pay | Admitting: Family Medicine

## 2017-01-24 DIAGNOSIS — M1711 Unilateral primary osteoarthritis, right knee: Secondary | ICD-10-CM | POA: Diagnosis not present

## 2017-01-24 DIAGNOSIS — M1712 Unilateral primary osteoarthritis, left knee: Secondary | ICD-10-CM | POA: Diagnosis not present

## 2017-01-24 DIAGNOSIS — Z1231 Encounter for screening mammogram for malignant neoplasm of breast: Secondary | ICD-10-CM

## 2017-01-24 DIAGNOSIS — M5410 Radiculopathy, site unspecified: Secondary | ICD-10-CM | POA: Diagnosis not present

## 2017-01-24 DIAGNOSIS — M4726 Other spondylosis with radiculopathy, lumbar region: Secondary | ICD-10-CM | POA: Diagnosis not present

## 2017-01-25 DIAGNOSIS — R7303 Prediabetes: Secondary | ICD-10-CM | POA: Diagnosis not present

## 2017-01-25 DIAGNOSIS — I10 Essential (primary) hypertension: Secondary | ICD-10-CM | POA: Diagnosis not present

## 2017-01-25 DIAGNOSIS — E559 Vitamin D deficiency, unspecified: Secondary | ICD-10-CM | POA: Diagnosis not present

## 2017-02-08 ENCOUNTER — Encounter: Payer: Self-pay | Admitting: Obstetrics & Gynecology

## 2017-02-08 ENCOUNTER — Ambulatory Visit: Payer: 59 | Admitting: Obstetrics & Gynecology

## 2017-02-08 ENCOUNTER — Ambulatory Visit (INDEPENDENT_AMBULATORY_CARE_PROVIDER_SITE_OTHER): Payer: 59 | Admitting: Obstetrics & Gynecology

## 2017-02-08 VITALS — BP 144/85 | HR 81 | Ht 67.0 in | Wt 289.0 lb

## 2017-02-08 DIAGNOSIS — N941 Unspecified dyspareunia: Secondary | ICD-10-CM | POA: Diagnosis not present

## 2017-02-08 MED ORDER — ESTRADIOL 0.1 MG/GM VA CREA: TOPICAL_CREAM | VAGINAL | 12 refills | Status: DC

## 2017-02-08 MED FILL — ESTRADIOL 0.1 MG/GM CRM: 0.1 | 90 days supply | Qty: 43 | Fill #0

## 2017-02-08 NOTE — Progress Notes (Signed)
   Subjective:    Patient ID: Michelle Huerta, female    DOB: December 28, 1960, 56 y.o.   MRN: [id number]  HPI 56 yo SAA P2 here today with the issue of pain with sex for about the last few months.She has been having sex with him for 14 years. She reports vaginal dryness but has not tried any lubricant. She has heard about vaginal estrogen and would like to try it.   Review of Systems     Objective:   Physical Exam Breathing, conversing, and ambulating normally Well nourished, well hydrated black female, no apparent distress Mild vulvovaginal atrophy, normal vaginal discharge No palpable adnexal masses, no tenderness with exam        Assessment & Plan:  Dyspareunia- I suggested that she try an OTC lubricant but she would like to try vaginal estrogen. I prescribed generic estrace to be used q mon, wed, fri. Follow up in 11/18

## 2017-02-13 DIAGNOSIS — M5410 Radiculopathy, site unspecified: Secondary | ICD-10-CM | POA: Diagnosis not present

## 2017-02-14 DIAGNOSIS — M545 Low back pain: Secondary | ICD-10-CM | POA: Diagnosis not present

## 2017-02-22 DIAGNOSIS — I10 Essential (primary) hypertension: Secondary | ICD-10-CM | POA: Diagnosis not present

## 2017-02-22 DIAGNOSIS — R7303 Prediabetes: Secondary | ICD-10-CM | POA: Diagnosis not present

## 2017-02-27 MED FILL — VALACYCLOVIR HCL 500 MG TAB: 500 | 90 days supply | Qty: 90 | Fill #2

## 2017-02-27 MED FILL — VALSARTAN-HCTZ 320-25 MG TA: 320-25 | 90 days supply | Qty: 90 | Fill #1

## 2017-03-10 DIAGNOSIS — R42 Dizziness and giddiness: Secondary | ICD-10-CM | POA: Diagnosis not present

## 2017-03-22 DIAGNOSIS — R252 Cramp and spasm: Secondary | ICD-10-CM | POA: Diagnosis not present

## 2017-03-22 DIAGNOSIS — A601 Herpesviral infection of perianal skin and rectum: Secondary | ICD-10-CM | POA: Diagnosis not present

## 2017-03-22 DIAGNOSIS — R42 Dizziness and giddiness: Secondary | ICD-10-CM | POA: Diagnosis not present

## 2017-03-22 DIAGNOSIS — G4700 Insomnia, unspecified: Secondary | ICD-10-CM | POA: Diagnosis not present

## 2017-03-22 DIAGNOSIS — Z1159 Encounter for screening for other viral diseases: Secondary | ICD-10-CM | POA: Diagnosis not present

## 2017-03-22 DIAGNOSIS — I10 Essential (primary) hypertension: Secondary | ICD-10-CM | POA: Diagnosis not present

## 2017-03-22 DIAGNOSIS — Z0000 Encounter for general adult medical examination without abnormal findings: Secondary | ICD-10-CM | POA: Diagnosis not present

## 2017-03-22 DIAGNOSIS — E559 Vitamin D deficiency, unspecified: Secondary | ICD-10-CM | POA: Diagnosis not present

## 2017-03-22 MED FILL — TEMAZEPAM 30 MG CAPSULE: 30 | 30 days supply | Qty: 30 | Fill #0

## 2017-03-22 MED FILL — CYCLOBENZAPRINE 10 MG TAB: 10 | 30 days supply | Qty: 30 | Fill #0

## 2017-03-23 MED FILL — TELMISARTAN 80 MG TABLET: 80 | 90 days supply | Qty: 90 | Fill #0

## 2017-04-03 MED FILL — SHINGRIX 50 MCG SUS: 50 | 30 days supply | Qty: 1 | Fill #0

## 2017-04-19 DIAGNOSIS — R7303 Prediabetes: Secondary | ICD-10-CM | POA: Diagnosis not present

## 2017-04-19 DIAGNOSIS — E559 Vitamin D deficiency, unspecified: Secondary | ICD-10-CM | POA: Diagnosis not present

## 2017-04-19 DIAGNOSIS — E039 Hypothyroidism, unspecified: Secondary | ICD-10-CM | POA: Diagnosis not present

## 2017-04-24 DIAGNOSIS — I10 Essential (primary) hypertension: Secondary | ICD-10-CM | POA: Diagnosis not present

## 2017-04-24 DIAGNOSIS — E039 Hypothyroidism, unspecified: Secondary | ICD-10-CM | POA: Diagnosis not present

## 2017-04-27 DIAGNOSIS — R221 Localized swelling, mass and lump, neck: Secondary | ICD-10-CM | POA: Diagnosis not present

## 2017-04-27 DIAGNOSIS — R252 Cramp and spasm: Secondary | ICD-10-CM | POA: Diagnosis not present

## 2017-04-27 DIAGNOSIS — I10 Essential (primary) hypertension: Secondary | ICD-10-CM | POA: Diagnosis not present

## 2017-04-27 MED FILL — AMLODIPINE BESYLATE 5 MG TA: 5 | 30 days supply | Qty: 30 | Fill #0

## 2017-04-28 ENCOUNTER — Encounter: Payer: Self-pay | Admitting: Obstetrics & Gynecology

## 2017-04-28 ENCOUNTER — Ambulatory Visit (INDEPENDENT_AMBULATORY_CARE_PROVIDER_SITE_OTHER): Payer: 59 | Admitting: Obstetrics & Gynecology

## 2017-04-28 VITALS — BP 163/89 | HR 92 | Wt 290.6 lb

## 2017-04-28 DIAGNOSIS — Z01419 Encounter for gynecological examination (general) (routine) without abnormal findings: Secondary | ICD-10-CM | POA: Diagnosis not present

## 2017-04-28 DIAGNOSIS — Z124 Encounter for screening for malignant neoplasm of cervix: Secondary | ICD-10-CM | POA: Diagnosis not present

## 2017-04-28 DIAGNOSIS — Z1151 Encounter for screening for human papillomavirus (HPV): Secondary | ICD-10-CM | POA: Diagnosis not present

## 2017-04-28 DIAGNOSIS — Z113 Encounter for screening for infections with a predominantly sexual mode of transmission: Secondary | ICD-10-CM

## 2017-04-28 MED ORDER — ESTRADIOL 10 MCG VA TABS: ORAL_TABLET | VAGINAL | 12 refills | Status: DC

## 2017-04-28 MED FILL — YUVAFEM 10 MCG TABS: 10 | 56 days supply | Qty: 24 | Fill #0

## 2017-04-28 NOTE — Progress Notes (Signed)
Flu in 04/2017 Mammogram scheduled 05/2017

## 2017-04-28 NOTE — Progress Notes (Signed)
Subjective:    Michelle Huerta is a 56 y.o. S AA P2 female who presents for an annual exam. The patient has no complaints today. She doesn't like the estrogen vaginal cream and would like to try Vagifem. The patient is sexually active. GYN screening history: last pap: was normal. The patient wears seatbelts: yes. The patient participates in regular exercise: no. Has the patient ever been transfused or tattooed?: yes. The patient reports that there is not domestic violence in her life.   Menstrual History: OB History    No data available      Menarche age: 82 Patient's last menstrual period was 08/01/2012.    The following portions of the patient's history were reviewed and updated as appropriate: allergies, current medications, past family history, past medical history, past social history, past surgical history and problem list.  Review of Systems Pertinent items are noted in HPI.   Works at [employer] in [employer] [place] Had flu vaccine, had shingrex Has mammogram next week FH- no breast/gyn/colon cancer Had colonoscopy last year.    Objective:    BP (!) 163/89   Pulse 92   Wt 290 lb 9.6 oz (131.8 kg)   LMP 08/01/2012   BMI 45.51 kg/m   General Appearance:    Alert, cooperative, no distress, appears stated age  Head:    Normocephalic, without obvious abnormality, atraumatic  Eyes:    PERRL, conjunctiva/corneas clear, EOM's intact, fundi    benign, both eyes  Ears:    Normal TM's and external ear canals, both ears  Nose:   Nares normal, septum midline, mucosa normal, no drainage    or sinus tenderness  Throat:   Lips, mucosa, and tongue normal; teeth and gums normal  Neck:   Supple, symmetrical, trachea midline, no adenopathy;    thyroid:  no enlargement/tenderness/nodules; no carotid   bruit or JVD  Back:     Symmetric, no curvature, ROM normal, no CVA tenderness  Lungs:     Clear to auscultation bilaterally, respirations unlabored  Chest Wall:    No tenderness or  deformity   Heart:    Regular rate and rhythm, S1 and S2 normal, no murmur, rub   or gallop  Breast Exam:    No tenderness, masses, or nipple abnormality  Abdomen:     Soft, non-tender, bowel sounds active all four quadrants,    no masses, no organomegaly  Genitalia:    Normal female without lesion, discharge or tenderness, NSSA, no palpable adnexal masses     Extremities:   Extremities normal, atraumatic, no cyanosis or edema  Pulses:   2+ and symmetric all extremities  Skin:   Skin color, texture, turgor normal, no rashes or lesions  Lymph nodes:   Cervical, supraclavicular, and axillary nodes normal  Neurologic:   CNII-XII intact, normal strength, sensation and reflexes    throughout  .    Assessment:    Healthy female exam.    Plan:     Mammogram. Thin prep Pap smear. with cotesting Vagifem

## 2017-04-29 LAB — RPR: RPR: NONREACTIVE

## 2017-04-29 LAB — HEPATITIS C ANTIBODY

## 2017-04-29 LAB — HEPATITIS B SURFACE ANTIGEN: HEP B S AG: NEGATIVE

## 2017-04-29 LAB — HIV ANTIBODY (ROUTINE TESTING W REFLEX): HIV SCREEN 4TH GENERATION: NONREACTIVE

## 2017-05-01 ENCOUNTER — Ambulatory Visit: Payer: 59

## 2017-05-02 LAB — CYTOLOGY - PAP
Adequacy: ABSENT
CHLAMYDIA, DNA PROBE: NEGATIVE
DIAGNOSIS: NEGATIVE
HPV: NOT DETECTED
NEISSERIA GONORRHEA: NEGATIVE

## 2017-05-08 DIAGNOSIS — E039 Hypothyroidism, unspecified: Secondary | ICD-10-CM | POA: Diagnosis not present

## 2017-05-08 DIAGNOSIS — R7303 Prediabetes: Secondary | ICD-10-CM | POA: Diagnosis not present

## 2017-05-17 DIAGNOSIS — M170 Bilateral primary osteoarthritis of knee: Secondary | ICD-10-CM | POA: Diagnosis not present

## 2017-05-17 DIAGNOSIS — M25571 Pain in right ankle and joints of right foot: Secondary | ICD-10-CM | POA: Diagnosis not present

## 2017-05-17 DIAGNOSIS — M25569 Pain in unspecified knee: Secondary | ICD-10-CM | POA: Diagnosis not present

## 2017-05-17 DIAGNOSIS — M25579 Pain in unspecified ankle and joints of unspecified foot: Secondary | ICD-10-CM | POA: Diagnosis not present

## 2017-05-17 DIAGNOSIS — I10 Essential (primary) hypertension: Secondary | ICD-10-CM | POA: Diagnosis not present

## 2017-05-17 DIAGNOSIS — L930 Discoid lupus erythematosus: Secondary | ICD-10-CM | POA: Diagnosis not present

## 2017-05-17 DIAGNOSIS — Z79899 Other long term (current) drug therapy: Secondary | ICD-10-CM | POA: Diagnosis not present

## 2017-05-17 DIAGNOSIS — M79604 Pain in right leg: Secondary | ICD-10-CM | POA: Diagnosis not present

## 2017-05-17 DIAGNOSIS — M889 Osteitis deformans of unspecified bone: Secondary | ICD-10-CM | POA: Diagnosis not present

## 2017-05-17 DIAGNOSIS — M1990 Unspecified osteoarthritis, unspecified site: Secondary | ICD-10-CM | POA: Diagnosis not present

## 2017-05-17 DIAGNOSIS — R252 Cramp and spasm: Secondary | ICD-10-CM | POA: Diagnosis not present

## 2017-05-17 MED FILL — HYDROXYCHLOROQUINE 200 MG T: 200 | 30 days supply | Qty: 30 | Fill #0

## 2017-05-17 MED FILL — METOPROLOL SUCC ER 25 MG TA: 25 | 30 days supply | Qty: 30 | Fill #0

## 2017-05-17 MED FILL — IBUPROFEN 800 MG TABS: 800 | 30 days supply | Qty: 90 | Fill #0

## 2017-06-05 MED FILL — SHINGRIX 50 MCG SUS: 50 | 30 days supply | Qty: 1 | Fill #1

## 2017-06-12 MED FILL — AMLODIPINE BESYLATE 5 MG TA: 5 | 90 days supply | Qty: 90 | Fill #0

## 2017-06-12 MED FILL — YUVAFEM 10 MCG VAGINAL INSE: 10 | 56 days supply | Qty: 24 | Fill #1

## 2017-06-27 MED FILL — VALACYCLOVIR HCL 500 MG TAB: 500 | 90 days supply | Qty: 90 | Fill #3

## 2017-06-27 MED FILL — TELMISARTAN 80 MG TABLET: 80 | 90 days supply | Qty: 90 | Fill #1

## 2017-07-10 DIAGNOSIS — Z79899 Other long term (current) drug therapy: Secondary | ICD-10-CM | POA: Diagnosis not present

## 2017-07-10 DIAGNOSIS — H35363 Drusen (degenerative) of macula, bilateral: Secondary | ICD-10-CM | POA: Diagnosis not present

## 2017-07-10 DIAGNOSIS — H25013 Cortical age-related cataract, bilateral: Secondary | ICD-10-CM | POA: Diagnosis not present

## 2017-07-10 DIAGNOSIS — H2513 Age-related nuclear cataract, bilateral: Secondary | ICD-10-CM | POA: Diagnosis not present

## 2017-07-10 DIAGNOSIS — H524 Presbyopia: Secondary | ICD-10-CM | POA: Diagnosis not present

## 2017-07-10 DIAGNOSIS — M329 Systemic lupus erythematosus, unspecified: Secondary | ICD-10-CM | POA: Diagnosis not present

## 2017-07-12 ENCOUNTER — Ambulatory Visit
Admission: RE | Admit: 2017-07-12 | Discharge: 2017-07-12 | Disposition: A | Discharge status: Home / Self Care | Payer: 59 | Source: Ambulatory Visit | Attending: Family Medicine | Admitting: Family Medicine

## 2017-07-12 DIAGNOSIS — Z1231 Encounter for screening mammogram for malignant neoplasm of breast: Secondary | ICD-10-CM

## 2017-07-17 DIAGNOSIS — R7303 Prediabetes: Secondary | ICD-10-CM | POA: Diagnosis not present

## 2017-07-17 DIAGNOSIS — I10 Essential (primary) hypertension: Secondary | ICD-10-CM | POA: Diagnosis not present

## 2017-07-31 DIAGNOSIS — R7303 Prediabetes: Secondary | ICD-10-CM | POA: Diagnosis not present

## 2017-08-04 MED FILL — TEMAZEPAM 30 MG CAPSULE: 30 | 30 days supply | Qty: 30 | Fill #1

## 2017-08-28 DIAGNOSIS — Z713 Dietary counseling and surveillance: Secondary | ICD-10-CM | POA: Diagnosis not present

## 2017-08-28 DIAGNOSIS — I10 Essential (primary) hypertension: Secondary | ICD-10-CM | POA: Diagnosis not present

## 2017-08-28 DIAGNOSIS — R7303 Prediabetes: Secondary | ICD-10-CM | POA: Diagnosis not present

## 2017-09-04 MED FILL — YUVAFEM 10 MCG VAGINAL INSE: 10 | 56 days supply | Qty: 24 | Fill #2

## 2017-09-10 DIAGNOSIS — Z8739 Personal history of other diseases of the musculoskeletal system and connective tissue: Secondary | ICD-10-CM | POA: Insufficient documentation

## 2017-09-11 DIAGNOSIS — M160 Bilateral primary osteoarthritis of hip: Secondary | ICD-10-CM | POA: Diagnosis not present

## 2017-09-11 DIAGNOSIS — Z9189 Other specified personal risk factors, not elsewhere classified: Secondary | ICD-10-CM | POA: Diagnosis not present

## 2017-09-11 DIAGNOSIS — R936 Abnormal findings on diagnostic imaging of limbs: Secondary | ICD-10-CM | POA: Diagnosis not present

## 2017-09-11 DIAGNOSIS — Z8739 Personal history of other diseases of the musculoskeletal system and connective tissue: Secondary | ICD-10-CM | POA: Diagnosis not present

## 2017-09-11 DIAGNOSIS — M889 Osteitis deformans of unspecified bone: Secondary | ICD-10-CM | POA: Diagnosis not present

## 2017-09-14 DIAGNOSIS — M889 Osteitis deformans of unspecified bone: Secondary | ICD-10-CM | POA: Diagnosis not present

## 2017-09-14 DIAGNOSIS — M170 Bilateral primary osteoarthritis of knee: Secondary | ICD-10-CM | POA: Diagnosis not present

## 2017-09-14 DIAGNOSIS — M25569 Pain in unspecified knee: Secondary | ICD-10-CM | POA: Diagnosis not present

## 2017-09-14 DIAGNOSIS — L930 Discoid lupus erythematosus: Secondary | ICD-10-CM | POA: Diagnosis not present

## 2017-09-14 DIAGNOSIS — M79604 Pain in right leg: Secondary | ICD-10-CM | POA: Diagnosis not present

## 2017-09-14 DIAGNOSIS — M1990 Unspecified osteoarthritis, unspecified site: Secondary | ICD-10-CM | POA: Diagnosis not present

## 2017-09-19 MED FILL — VALACYCLOVIR HCL 500 MG TAB: 500 | 90 days supply | Qty: 90 | Fill #0

## 2017-09-19 MED FILL — AMLODIPINE BESYLATE 5 MG TA: 5 | 90 days supply | Qty: 90 | Fill #1

## 2017-09-19 MED FILL — HYDROXYCHLOROQUINE 200 MG: 200 | 30 days supply | Qty: 30 | Fill #1

## 2017-09-19 MED FILL — TEMAZEPAM 30 MG CAPSULE: 30 | 30 days supply | Qty: 30 | Fill #0

## 2017-09-25 DIAGNOSIS — I10 Essential (primary) hypertension: Secondary | ICD-10-CM | POA: Diagnosis not present

## 2017-09-25 DIAGNOSIS — Z713 Dietary counseling and surveillance: Secondary | ICD-10-CM | POA: Diagnosis not present

## 2017-09-25 MED FILL — TELMISARTAN 80 MG TABS: 80 | 90 days supply | Qty: 90 | Fill #0

## 2017-09-27 DIAGNOSIS — J069 Acute upper respiratory infection, unspecified: Secondary | ICD-10-CM | POA: Diagnosis not present

## 2017-09-27 DIAGNOSIS — R0981 Nasal congestion: Secondary | ICD-10-CM | POA: Diagnosis not present

## 2017-09-27 DIAGNOSIS — R05 Cough: Secondary | ICD-10-CM | POA: Diagnosis not present

## 2017-09-27 MED FILL — HYDROCODONE-HOMATROPINE SYR: 5-1.5 | 7 days supply | Qty: 140 | Fill #0

## 2017-09-29 DIAGNOSIS — R0981 Nasal congestion: Secondary | ICD-10-CM | POA: Diagnosis not present

## 2017-09-29 DIAGNOSIS — R05 Cough: Secondary | ICD-10-CM | POA: Diagnosis not present

## 2017-09-29 DIAGNOSIS — J209 Acute bronchitis, unspecified: Secondary | ICD-10-CM | POA: Diagnosis not present

## 2017-10-18 DIAGNOSIS — Z8739 Personal history of other diseases of the musculoskeletal system and connective tissue: Secondary | ICD-10-CM | POA: Diagnosis not present

## 2017-10-18 DIAGNOSIS — N858 Other specified noninflammatory disorders of uterus: Secondary | ICD-10-CM | POA: Diagnosis not present

## 2017-10-18 DIAGNOSIS — M889 Osteitis deformans of unspecified bone: Secondary | ICD-10-CM | POA: Diagnosis not present

## 2017-10-18 DIAGNOSIS — D259 Leiomyoma of uterus, unspecified: Secondary | ICD-10-CM | POA: Diagnosis not present

## 2017-10-18 DIAGNOSIS — R936 Abnormal findings on diagnostic imaging of limbs: Secondary | ICD-10-CM | POA: Diagnosis not present

## 2017-10-18 DIAGNOSIS — Z9189 Other specified personal risk factors, not elsewhere classified: Secondary | ICD-10-CM | POA: Diagnosis not present

## 2017-10-19 DIAGNOSIS — D259 Leiomyoma of uterus, unspecified: Secondary | ICD-10-CM

## 2017-10-19 HISTORY — DX: Leiomyoma of uterus, unspecified: D25.9

## 2017-10-23 ENCOUNTER — Telehealth: Payer: Self-pay | Admitting: *Deleted

## 2017-10-23 NOTE — Telephone Encounter (Signed)
Faxed CMN  For CPAP supplies to [facility].

## 2017-11-13 MED FILL — ESTRADIOL 10 MCG TABS: 10 | 56 days supply | Qty: 24 | Fill #3

## 2017-11-23 DIAGNOSIS — G4730 Sleep apnea, unspecified: Secondary | ICD-10-CM | POA: Diagnosis not present

## 2017-11-23 DIAGNOSIS — M545 Low back pain: Secondary | ICD-10-CM | POA: Diagnosis not present

## 2017-11-23 DIAGNOSIS — I10 Essential (primary) hypertension: Secondary | ICD-10-CM | POA: Diagnosis not present

## 2017-11-23 DIAGNOSIS — Z6841 Body Mass Index (BMI) 40.0 and over, adult: Secondary | ICD-10-CM | POA: Diagnosis not present

## 2017-11-23 DIAGNOSIS — K5900 Constipation, unspecified: Secondary | ICD-10-CM | POA: Diagnosis not present

## 2017-11-23 DIAGNOSIS — R42 Dizziness and giddiness: Secondary | ICD-10-CM | POA: Diagnosis not present

## 2017-11-23 DIAGNOSIS — R1084 Generalized abdominal pain: Secondary | ICD-10-CM | POA: Diagnosis not present

## 2017-11-23 DIAGNOSIS — G4700 Insomnia, unspecified: Secondary | ICD-10-CM | POA: Diagnosis not present

## 2017-11-23 MED FILL — CYCLOBENZAPRINE 10 MG TAB: 10 | 30 days supply | Qty: 30 | Fill #0

## 2017-11-23 MED FILL — FLUTICASONE PROP 50 MCG SPR: 50 | 30 days supply | Qty: 16 | Fill #0

## 2017-12-02 ENCOUNTER — Encounter (HOSPITAL_COMMUNITY): Payer: Self-pay | Admitting: *Deleted

## 2017-12-02 ENCOUNTER — Ambulatory Visit (HOSPITAL_COMMUNITY)
Admission: EM | Admit: 2017-12-02 | Discharge: 2017-12-02 | Disposition: A | Discharge status: Home / Self Care | Payer: 59 | Attending: Family Medicine | Admitting: Family Medicine

## 2017-12-02 ENCOUNTER — Other Ambulatory Visit: Payer: Self-pay

## 2017-12-02 DIAGNOSIS — R109 Unspecified abdominal pain: Secondary | ICD-10-CM

## 2017-12-02 LAB — POCT URINALYSIS DIP (DEVICE)
Bilirubin Urine: NEGATIVE
GLUCOSE, UA: NEGATIVE mg/dL
Hgb urine dipstick: NEGATIVE
Ketones, ur: NEGATIVE mg/dL
Leukocytes, UA: NEGATIVE
NITRITE: NEGATIVE
PH: 6 (ref 5.0–8.0)
Protein, ur: NEGATIVE mg/dL
Specific Gravity, Urine: 1.025 (ref 1.005–1.030)
UROBILINOGEN UA: 0.2 mg/dL (ref 0.0–1.0)

## 2017-12-02 MED ORDER — PREDNISONE 20 MG PO TABS: ORAL_TABLET | ORAL | 0 refills | Status: DC

## 2017-12-02 NOTE — Discharge Instructions (Addendum)
Your symptoms are most consistent with a muscle strain.  Generally the muscle relaxer this is going to make you drowsy and I think he'll do better with a short course of steroids to reduce inflammation. After work, he might try applying some heat to the area.

## 2017-12-02 NOTE — ED Provider Notes (Signed)
[place]   [id number] 12/02/17 Arrival Time: 1638   SUBJECTIVE:  Michelle Huerta is a 57 y.o. female who presents to the urgent care with complaint of constant right "side" pain "on the inside" x 3 wks with painful movements; points to right mid-back, but states it "feels more inside and not the back".  Saw PCP last wk - told to take Metamucil and had muscle relaxer without any relief.  Patient's had no nausea, vomiting, diarrhea. She's also had no fever, blood in her urine, or dysuria.  Patient works in housekeeping and she notes that bending over causes the pain to occur. Described sharp and deep and located below the posterior right ribs  Past Medical History:  Diagnosis Date  . Allergy   . Asthma    no medicines  . Bilateral chronic knee pain   . Chronic back pain   . DDD (degenerative disc disease), cervical   . Depression    no meds  . Genital herpes    HSV type 2 at rectum, culture positive 3/15  . Glaucoma   . Heart murmur   . Hypertension   . Insomnia   . Lupus ([place])   . Mild sleep apnea    1/17 CPAP Dr Claiborne Billings  . Morbid obesity ([place]) 06/11/2015  . Osteoporosis   . Paget's disease   . Paget's disease of bone    Dr Trudie Reed, Reclast insufion 02/14/10 - bad reaction to infusion, now tolerates fosamax weekly  . Sleep apnea   . Thrombocytopenia ([place])   . Tubular adenoma polyp of rectum    8/12, Dr Fuller Plan, 5 yr follow up   . Vitamin D deficiency    Family History  Problem Relation Age of Onset  . Hypertension Mother   . Heart disease Mother   . Cancer Maternal Grandmother   . Stomach cancer Paternal Grandmother   . Colon cancer Neg Hx    Social History   Socioeconomic History  . Marital status: Single    Spouse name: Not on file  . Number of children: Not on file  . Years of education: Not on file  . Highest education level: Not on file  Occupational History  . Not on file  Social Needs  . Financial resource strain: Not on file  . Food  insecurity:    Worry: Not on file    Inability: Not on file  . Transportation needs:    Medical: Not on file    Non-medical: Not on file  Tobacco Use  . Smoking status: Former Smoker    Years: 7.00    Types: Cigars    Last attempt to quit: 08/11/2008    Years since quitting: 9.3  . Smokeless tobacco: Never Used  . Tobacco comment: smoked [employer], 1 pack per day (5 in a pack)  Substance and Sexual Activity  . Alcohol use: Yes    Alcohol/week: 3.0 oz    Types: 5 Cans of beer per week  . Drug use: Never  . Sexual activity: Not Currently    Birth control/protection: Surgical  Lifestyle  . Physical activity:    Days per week: Not on file    Minutes per session: Not on file  . Stress: Not on file  Relationships  . Social connections:    Talks on phone: Not on file    Gets together: Not on file    Attends religious service: Not on file    Active member of club or organization: Not  on file    Attends meetings of clubs or organizations: Not on file    Relationship status: Not on file  . Intimate partner violence:    Fear of current or ex partner: Not on file    Emotionally abused: Not on file    Physically abused: Not on file    Forced sexual activity: Not on file  Other Topics Concern  . Not on file  Social History Narrative   Epworth sleepiness score as of 06/11/15 a 4   Exercise: yes, active at work, no formal exercise   Single, never married, in relationship off and on   Children: Chester, Joaquin (both in [place]), 2 grandkids.  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 amLODipine (NORVASC) 5 MG tablet   . Estradiol 10 MCG TABS vaginal tablet 1 pill per vagina every Mon, Wed, Friday  . fluticasone (FLONASE) 50 MCG/ACT nasal spray Place into both nostrils daily.  [name] telmisartan (MICARDIS) 80 MG tablet  Take 80 mg by mouth daily.  . valACYclovir (VALTREX) 500 MG tablet Take 500 mg by mouth daily.  . [DISCONTINUED] cyclobenzaprine (FLEXERIL) 10 MG tablet TAKE 1 TABLET BY MOUTH AT BEDTIME AS NEEDED FOR SPASMS   Allergies  Allergen Reactions  . Penicillins Rash  . Reclast [Zoledronic Acid] Nausea And Vomiting      ROS: As per HPI, remainder of ROS negative.   OBJECTIVE:   Vitals:   12/02/17 1719  BP: 130/72  Pulse: 72  Resp: 18  Temp: 97.9 F (36.6 C)  TempSrc: Oral  SpO2: 100%     General appearance: alert; no distress Eyes: PERRL; EOMI; conjunctiva normal HENT: normocephalic; atraumatic;  oral mucosa normal Neck: supple Abdomen: soft, non-tender; bowel sounds normal; no masses or organomegaly; no guarding or rebound tenderness Back: no CVA tenderness Extremities: no cyanosis or edema; symmetrical with no gross deformities Skin: warm and dry Neurologic: normal gait; grossly normal Psychological: alert and cooperative; normal mood and affect      Labs:  Results for orders placed or performed during the hospital encounter of 12/02/17  POCT urinalysis dip (device)  Result Value Ref Range   Glucose, UA NEGATIVE NEGATIVE mg/dL   Bilirubin Urine NEGATIVE NEGATIVE   Ketones, ur NEGATIVE NEGATIVE mg/dL   Specific Gravity, Urine 1.025 1.005 - 1.030   Hgb urine dipstick NEGATIVE NEGATIVE   pH 6.0 5.0 - 8.0   Protein, ur NEGATIVE NEGATIVE mg/dL   Urobilinogen, UA 0.2 0.0 - 1.0 mg/dL   Nitrite NEGATIVE NEGATIVE   Leukocytes, UA NEGATIVE NEGATIVE    Labs Reviewed  POCT URINALYSIS DIP (DEVICE)    No results found.     ASSESSMENT & PLAN:  1. Right flank pain   Your symptoms are most consistent with a muscle strain.  Generally the muscle relaxer this is going to make you drowsy and I think he'll do better with a short course of steroids to reduce inflammation. After work, he might try applying some heat to the area.  Meds ordered this encounter    Medications  . predniSONE (DELTASONE) 20 MG tablet    Sig: Two daily with food    Dispense:  10 tablet    Refill:  0    Reviewed expectations re: course of current medical issues. Questions answered. Outlined signs and  symptoms indicating need for more acute intervention. Patient verbalized understanding. After Visit Summary given.    Procedures:      Robyn Haber, MD 12/02/17 Michelle Huerta

## 2017-12-02 NOTE — ED Triage Notes (Addendum)
Pt c/o constant right "side" pain "on the inside" x 3 wks with painful movements; points to right mid-back, but states it "feels more inside and not the back".  Saw PCP last wk - told to take Metamucil and had muscle relaxer without any relief.

## 2017-12-02 NOTE — ED Notes (Signed)
Patient is unable to void at this time. Patient requested water, provided water to the patient

## 2017-12-06 ENCOUNTER — Other Ambulatory Visit: Payer: Self-pay

## 2017-12-06 ENCOUNTER — Ambulatory Visit: Payer: 59 | Admitting: Physician Assistant

## 2017-12-06 ENCOUNTER — Ambulatory Visit (INDEPENDENT_AMBULATORY_CARE_PROVIDER_SITE_OTHER): Payer: 59

## 2017-12-06 ENCOUNTER — Encounter: Payer: Self-pay | Admitting: Physician Assistant

## 2017-12-06 VITALS — BP 136/90 | HR 87 | Temp 98.9°F | Resp 16 | Ht 66.25 in | Wt 303.6 lb

## 2017-12-06 DIAGNOSIS — K5900 Constipation, unspecified: Secondary | ICD-10-CM | POA: Diagnosis not present

## 2017-12-06 DIAGNOSIS — R109 Unspecified abdominal pain: Secondary | ICD-10-CM

## 2017-12-06 MED ORDER — POLYETHYLENE GLYCOL 3350 17 GM/SCOOP PO POWD: 17.0000 g | Freq: Two times a day (BID) | ORAL | 1 refills | Status: DC | PRN

## 2017-12-06 NOTE — Patient Instructions (Addendum)
  For constipation  Look up "6 Reasons to Care about [place]" at [place] (WindowBlog.ch)  1) Water: Make sure you are drinking enough water daily - about 1-3 liters. 2) Fiber: Make sure you are getting enough fiber in your diet - this will make you regular - you can eat high fiber foods or use metamucil as a supplement - it is really important to drink enough water when using fiber supplements. Foods that have a lot of fiber include vegetables, fruits, beans, nuts, oatmeal, and some breads and cereals. You can tell how much fiber is in a food by reading the nutrition label. Doctors recommend eating 25 to 36 grams of fiber each day. 3) Fitness: Increasing your physical activity will help increase the natural movement of your bowels. Try to get 20-30 minutes of exercise daily.  4) Use a 9" stool in front of your toilet to rest your feet on while you have a bowel movement. This will loosen your rectal muscles to help your stool come out easier and prevent straining.   --- If your stools are hard or are formed balls or you have to strain a stool softener will help - use colace 2-3 capsule a day  Option 1) For gentle treatment of constipation Use Miralax 1-2 capfuls a day until your stools are soft and regular and then decrease the usage - you can use this daily  (Do this one) Option 2) For more aggressive treatment of constipation Use 4 capfuls of Colace and 6 doses of Miralax and drink it in 2 hours - this should result in several watery stools - if it does not repeat the next day and then go to daily miralax for a week to make sure your bowels are clean and retrained to work properly  COME BACK AND SEE ME IN 1-2 WEEKS TO CHECK BACK IN.    Thank you for coming in today. I hope you feel we met your needs.  Feel free to call PCP if you have any questions or further requests.  Please consider signing up for MyChart if you do not already have it, as this  is a great way to communicate with me.  Best,  Whitney McVey, PA-C  IF you received an x-ray today, you will receive an invoice from [facility]. Please contact [facility] at [phone] with questions or concerns regarding your invoice.   IF you received labwork today, you will receive an invoice from [place]. Please contact [facility] at [phone] with questions or concerns regarding your invoice.   Our billing staff will not be able to assist you with questions regarding bills from these companies.  You will be contacted with the lab results as soon as they are available. The fastest way to get your results is to activate your My Chart account. Instructions are located on the last page of this paperwork. If you have not heard from [place] regarding the results in 2 weeks, please contact this office.

## 2017-12-06 NOTE — Progress Notes (Signed)
Michelle Huerta  MRN: [id number] DOB: [birth year]  PCP: Harlan Stains, MD  Subjective:  Pt is a 57 year old female who presents to clinic for continued side pain.   Strains to poop. Yesterday "little balls came out". 2 weeks ago water loose stool.   LMP 5 years ago  Early this month PCP [facility] - advised to take fiber. Rx for muscle spasm. She tried fiber for about 2 days.  Urgent care 5 days ago - prednisone. Not helping. UA was negative.   Review of Systems  Gastrointestinal: Positive for constipation. Negative for abdominal pain, diarrhea, nausea and vomiting.  Musculoskeletal: Positive for back pain.    Patient Active Problem List   Diagnosis Date Noted  . OSA on CPAP 10/28/2015  . Fibroids 08/07/2015  . Plantar fasciitis, bilateral 08/03/2015  . Metatarsal deformity 08/03/2015  . Equinus deformity of foot, acquired 08/03/2015  . Pronation deformity of both feet 08/03/2015  . Morbid obesity ([place]) 06/11/2015  . 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 Essential (primary) hypertension 06/11/2015  . 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 Benign essential HTN 09/19/2013  . Major depressive episode 09/19/2013    Current Outpatient Medications on File Prior to Visit  Medication Sig Dispense Refill  . amLODipine (NORVASC) 5 MG tablet     . Estradiol 10 MCG TABS vaginal tablet 1 pill per vagina every Mon, Wed, Friday 30 tablet 12  . fluticasone (FLONASE) 50 MCG/ACT nasal spray Place into both nostrils daily.    . predniSONE (DELTASONE) 20 MG tablet Two daily with food 10 tablet 0  . telmisartan (MICARDIS) 80 MG tablet Take 80 mg by mouth daily.  1  . valACYclovir (VALTREX) 500 MG tablet Take 500 mg by mouth daily.    . Hydroxychloroquine Sulfate (PLAQUENIL PO) Take by mouth.     No current facility-administered medications on file prior to visit.     Allergies  Allergen Reactions  . Penicillins Rash  . Reclast [Zoledronic Acid] Nausea And Vomiting       Objective:  BP 136/90 (BP Location: Left Arm, Patient Position: Sitting, Cuff Size: Large)   Pulse 87   Temp 98.9 F (37.2 C) (Oral)   Resp 16   Ht 5' 6.25" (1.683 m)   Wt (!) 303 lb 9.6 oz (137.7 kg)   LMP 08/01/2012   SpO2 97%   BMI 48.63 kg/m   Physical Exam  Constitutional: She is oriented to person, place, and time. No distress.  Cardiovascular: Normal rate, regular rhythm and normal heart sounds.  Abdominal: Soft. Normal appearance and bowel sounds are normal. There is no hepatosplenomegaly. There is no tenderness. There is no rigidity, no guarding and no CVA tenderness.  Musculoskeletal:       Lumbar back: She exhibits normal range of motion, no tenderness and no bony tenderness.  Neurological: She is alert and oriented to person, place, and time.  Skin: Skin is warm and dry.  Psychiatric: Judgment normal.  Vitals reviewed.  Dg Abd 2 Views  Result Date: 12/06/2017 CLINICAL DATA:  Abdominal pain for 1 month EXAM: ABDOMEN - 2 VIEW COMPARISON:  12/28/2016 FINDINGS: There is no bowel dilatation to suggest obstruction. There is no evidence of pneumoperitoneum, portal venous gas or pneumatosis. There are no pathologic calcifications along the expected course of the ureters. Diffuse patchy sclerosis of the pelvis, proximal femora and lower lumbar spine bilaterally most consistent with Paget's disease. IMPRESSION: No acute abdominal abnormality. Electronically Signed   By: Elbert Ewings  Patel   On: 12/06/2017 18:53    Assessment and Plan :  1. Constipation, unspecified constipation type - polyethylene glycol powder (GLYCOLAX/MIRALAX) powder; Take 17 g by mouth 2 (two) times daily as needed. (Patient not taking: Reported on 12/18/2017)  Dispense: 578 g; Refill: 1 - Pt presents with non-resolving side pain, refractory to flexeril and prednisone. PE is unremarkable. Abd x-ray is negative.  She never tried fiber, suggested by her PCP. Plan to try a week or two of miralax. RTC in 1 week if no  improvement or worsening symptoms. CBC and CMP are pending.  2. Side pain - DG Abd 2 Views; Future - CMP14+EGFR - CBC with Differential/Platelet   Mercer Pod, PA-C  [facility] at [place] 12/06/2017 5:51 PM

## 2017-12-07 LAB — CBC WITH DIFFERENTIAL/PLATELET
Basophils Absolute: 0 10*3/uL (ref 0.0–0.2)
Basos: 0 %
EOS (ABSOLUTE): 0 10*3/uL (ref 0.0–0.4)
Eos: 0 %
Hematocrit: 38 % (ref 34.0–46.6)
Hemoglobin: 12.3 g/dL (ref 11.1–15.9)
Immature Grans (Abs): 0.1 10*3/uL (ref 0.0–0.1)
Immature Granulocytes: 1 %
Lymphocytes Absolute: 2.3 10*3/uL (ref 0.7–3.1)
Lymphs: 20 %
MCH: 27.2 pg (ref 26.6–33.0)
MCHC: 32.4 g/dL (ref 31.5–35.7)
MCV: 84 fL (ref 79–97)
Monocytes Absolute: 0.2 10*3/uL (ref 0.1–0.9)
Monocytes: 2 %
Neutrophils Absolute: 8.5 10*3/uL — ABNORMAL HIGH (ref 1.4–7.0)
Neutrophils: 77 %
Platelets: 196 10*3/uL (ref 150–450)
RBC: 4.53 x10E6/uL (ref 3.77–5.28)
RDW: 15.8 % — ABNORMAL HIGH (ref 12.3–15.4)
WBC: 11.1 10*3/uL — ABNORMAL HIGH (ref 3.4–10.8)

## 2017-12-07 LAB — CMP14+EGFR
ALT: 18 IU/L (ref 0–32)
AST: 13 IU/L (ref 0–40)
Albumin/Globulin Ratio: 1.2 (ref 1.2–2.2)
Albumin: 4.2 g/dL (ref 3.5–5.5)
Alkaline Phosphatase: 92 IU/L (ref 39–117)
BUN/Creatinine Ratio: 21 (ref 9–23)
BUN: 21 mg/dL (ref 6–24)
Bilirubin Total: 0.4 mg/dL (ref 0.0–1.2)
CO2: 23 mmol/L (ref 20–29)
Calcium: 9.2 mg/dL (ref 8.7–10.2)
Chloride: 107 mmol/L — ABNORMAL HIGH (ref 96–106)
Creatinine, Ser: 1.01 mg/dL — ABNORMAL HIGH (ref 0.57–1.00)
GFR calc Af Amer: 72 mL/min/{1.73_m2} (ref >59)
GFR calc non Af Amer: 63 mL/min/{1.73_m2} (ref >59)
Globulin, Total: 3.6 g/dL (ref 1.5–4.5)
Glucose: 106 mg/dL — ABNORMAL HIGH (ref 65–99)
Potassium: 4.7 mmol/L (ref 3.5–5.2)
Sodium: 143 mmol/L (ref 134–144)
Total Protein: 7.8 g/dL (ref 6.0–8.5)

## 2017-12-12 ENCOUNTER — Telehealth: Payer: 59 | Admitting: Family

## 2017-12-12 DIAGNOSIS — B9789 Other viral agents as the cause of diseases classified elsewhere: Secondary | ICD-10-CM | POA: Diagnosis not present

## 2017-12-12 DIAGNOSIS — J019 Acute sinusitis, unspecified: Secondary | ICD-10-CM

## 2017-12-12 MED ORDER — FLUTICASONE PROPIONATE 50 MCG/ACT NA SUSP: 2.0000 | Freq: Every day | NASAL | 6 refills | Status: DC

## 2017-12-12 NOTE — Progress Notes (Signed)

## 2017-12-15 ENCOUNTER — Encounter: Payer: Self-pay | Admitting: Emergency Medicine

## 2017-12-15 ENCOUNTER — Other Ambulatory Visit: Payer: Self-pay

## 2017-12-15 ENCOUNTER — Ambulatory Visit: Payer: 59 | Admitting: Emergency Medicine

## 2017-12-15 VITALS — BP 102/66 | HR 95 | Temp 99.2°F | Resp 16 | Ht 67.25 in | Wt 294.2 lb

## 2017-12-15 DIAGNOSIS — J22 Unspecified acute lower respiratory infection: Secondary | ICD-10-CM

## 2017-12-15 DIAGNOSIS — H1031 Unspecified acute conjunctivitis, right eye: Secondary | ICD-10-CM

## 2017-12-15 DIAGNOSIS — R059 Cough, unspecified: Secondary | ICD-10-CM | POA: Insufficient documentation

## 2017-12-15 DIAGNOSIS — R05 Cough: Secondary | ICD-10-CM | POA: Diagnosis not present

## 2017-12-15 MED ORDER — POLYMYXIN B-TRIMETHOPRIM 10000-0.1 UNIT/ML-% OP SOLN: 2.0000 [drp] | OPHTHALMIC | 1 refills | Status: AC

## 2017-12-15 MED ORDER — PROMETHAZINE-CODEINE 6.25-10 MG/5ML PO SYRP: 5.0000 mL | ORAL_SOLUTION | Freq: Every evening | ORAL | 0 refills | Status: DC | PRN

## 2017-12-15 MED ORDER — AZITHROMYCIN 250 MG PO TABS: ORAL_TABLET | ORAL | 0 refills | Status: DC

## 2017-12-15 NOTE — Addendum Note (Signed)
Addended by: Davina Poke on: 12/15/2017 10:55 AM   Modules accepted: Orders

## 2017-12-15 NOTE — Patient Instructions (Addendum)
IF you received an x-ray today, you will receive an invoice from [facility]. Please contact [facility] at [phone] with questions or concerns regarding your invoice.   IF you received labwork today, you will receive an invoice from [place]. Please contact [facility] at [phone] with questions or concerns regarding your invoice.   Our billing staff will not be able to assist you with questions regarding bills from these companies.  You will be contacted with the lab results as soon as they are available. The fastest way to get your results is to activate your My Chart account. Instructions are located on the last page of this paperwork. If you have not heard from [place] regarding the results in 2 weeks, please contact this office.     Bacterial Conjunctivitis Bacterial conjunctivitis is an infection of your conjunctiva. This is the clear membrane that covers the white part of your eye and the inner surface of your eyelid. This condition can make your eye:  Red or pink.  Itchy.  This condition is caused by bacteria. This condition spreads very easily from person to person (is contagious) and from one eye to the other eye. Follow these instructions at home: Medicines  Take or apply your antibiotic medicine as told by your doctor. Do not stop taking or applying the antibiotic even if you start to feel better.  Take or apply over-the-counter and prescription medicines only as told by your doctor.  Do not touch your eyelid with the eye drop bottle or the ointment tube. Managing discomfort  Wipe any fluid from your eye with a warm, wet washcloth or a cotton ball.  Place a cool, clean washcloth on your eye. Do this for 10-20 minutes, 3-4 times per day. General instructions  Do not wear contact lenses until the irritation is gone. Wear glasses until your doctor says it is okay to wear contacts.  Do not wear eye makeup until your symptoms are gone. Throw away  any old makeup.  Change or wash your pillowcase every day.  Do not share towels or washcloths with anyone.  Wash your hands often with soap and water. Use paper towels to dry your hands.  Do not touch or rub your eyes.  Do not drive or use heavy machinery if your vision is blurry. Contact a doctor if:  You have a fever.  Your symptoms do not get better after 10 days. Get help right away if:  You have a fever and your symptoms suddenly get worse.  You have very bad pain when you move your eye.  Your face: ? Hurts. ? Is red. ? Is swollen.  You have sudden loss of vision. This information is not intended to replace advice given to you by your health care provider. Make sure you discuss any questions you have with your health care provider. Document Released: 04/05/2008 Document Revised: 12/03/2015 Document Reviewed: 04/09/2015 Elsevier Interactive Patient Education  2018 [place].  Cough, Adult A cough helps to clear your throat and lungs. A cough may last only 2-3 weeks (acute), or it may last longer than 8 weeks (chronic). Many different things can cause a cough. A cough may be a sign of an illness or another medical condition. Follow these instructions at home:  Pay attention to any changes in your cough.  Take medicines only as told by your doctor. ? If you were prescribed an antibiotic medicine, take it as told by your doctor. Do not stop taking it even  if you start to feel better. ? Talk with your doctor before you try using a cough medicine.  Drink enough fluid to keep your pee (urine) clear or pale yellow.  If the air is dry, use a cold steam vaporizer or humidifier in your home.  Stay away from things that make you cough at work or at home.  If your cough is worse at night, try using extra pillows to raise your head up higher while you sleep.  Do not smoke, and try not to be around smoke. If you need help quitting, ask your doctor.  Do not have  caffeine.  Do not drink alcohol.  Rest as needed. Contact a doctor if:  You have new problems (symptoms).  You cough up yellow fluid (pus).  Your cough does not get better after 2-3 weeks, or your cough gets worse.  Medicine does not help your cough and you are not sleeping well.  You have pain that gets worse or pain that is not helped with medicine.  You have a fever.  You are losing weight and you do not know why.  You have night sweats. Get help right away if:  You cough up blood.  You have trouble breathing.  Your heartbeat is very fast. This information is not intended to replace advice given to you by your health care provider. Make sure you discuss any questions you have with your health care provider. Document Released: 03/10/2011 Document Revised: 12/03/2015 Document Reviewed: 09/03/2014 Elsevier Interactive Patient Education  [employer].

## 2017-12-15 NOTE — Progress Notes (Signed)
Michelle Huerta 57 y.o.   Chief Complaint  Patient presents with  . Cough    productive with [place] mucus x 3 days and per patient has numbness in fingers 12/14/2017  . Sore Throat    HISTORY OF PRESENT ILLNESS: This is a 57 y.o. female complaining of productive cough with nasal congestion, sinus pressure, sore throat that started 3 days ago.  Also complaining of redness and discharge from the right eye.  No other significant symptoms.  Cough  This is a new problem. The current episode started in the past 7 days. The problem has been gradually worsening. The problem occurs every few minutes. The cough is productive of sputum. Associated symptoms include eye redness, nasal congestion and a sore throat. Pertinent negatives include no chest pain, chills, ear congestion, ear pain, fever, headaches, hemoptysis, myalgias, rash, shortness of breath or wheezing. Risk factors: none. She has tried OTC cough suppressant for the symptoms. The treatment provided no relief. There is no history of asthma or COPD.     Prior to Admission medications   Medication Sig Start Date End Date Taking? Authorizing Provider  amLODipine (NORVASC) 5 MG tablet  04/27/17  Yes [provider]  fluticasone (FLONASE) 50 MCG/ACT nasal spray Place into both nostrils daily.   Yes [provider]  polyethylene glycol powder (GLYCOLAX/MIRALAX) powder Take 17 g by mouth 2 (two) times daily as needed. 12/06/17  Yes McVey, Gelene Mink, PA-C  telmisartan (MICARDIS) 80 MG tablet Take 80 mg by mouth daily. 03/23/17  Yes [provider]  valACYclovir (VALTREX) 500 MG tablet Take 500 mg by mouth daily.   Yes [provider]  Estradiol 10 MCG TABS vaginal tablet 1 pill per vagina every Mon, Wed, Friday 04/28/17   Emily Filbert, MD  fluticasone (FLONASE) 50 MCG/ACT nasal spray Place 2 sprays into both nostrils daily. 12/12/17   Dutch Quint B, FNP  Hydroxychloroquine Sulfate (PLAQUENIL PO) Take by mouth.     [provider]  predniSONE (DELTASONE) 20 MG tablet Two daily with food Patient not taking: Reported on 12/15/2017 12/02/17   Robyn Haber, MD    Allergies  Allergen Reactions  . Penicillins Rash  . Reclast [Zoledronic Acid] Nausea And Vomiting    Patient Active Problem List   Diagnosis Date Noted  . OSA on CPAP 10/28/2015  . Fibroids 08/07/2015  . Plantar fasciitis, bilateral 08/03/2015  . Metatarsal deformity 08/03/2015  . Equinus deformity of foot, acquired 08/03/2015  . Pronation deformity of both feet 08/03/2015  . Morbid obesity ([place]) 06/11/2015  . 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 Essential (primary) hypertension 06/11/2015  . 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 Benign essential HTN 09/19/2013  . Major depressive episode 09/19/2013    Past Medical History:  Diagnosis Date  . Allergy   . Asthma    no medicines  . Bilateral chronic knee pain   . Chronic back pain   . DDD (degenerative disc disease), cervical   . Depression    no meds  . Genital herpes    HSV type 2 at rectum, culture positive 3/15  . Glaucoma   . Heart murmur   . Hypertension   . Insomnia   . Lupus ([place])   . Mild sleep apnea    1/17 CPAP Dr Claiborne Billings  . Morbid obesity ([place]) 06/11/2015  . Osteoporosis   . Paget's disease   . Paget's disease of bone    Dr Trudie Reed, Reclast insufion 02/14/10 - bad  reaction to infusion, now tolerates fosamax weekly  . Sleep apnea   . Thrombocytopenia ([place])   . Tubular adenoma polyp of rectum    8/12, Dr Fuller Plan, 5 yr follow up   . Vitamin D deficiency     Past Surgical History:  Procedure Laterality Date  . AUGMENTATION MAMMAPLASTY Bilateral   . BREAST ENHANCEMENT SURGERY    . BREAST REDUCTION SURGERY    . DILATION AND CURETTAGE OF UTERUS N/A 06/24/2015   Procedure: DILATATION AND CURETTAGE;  Surgeon: Emily Filbert, MD;  Location: [facility];  Service: Gynecology;  Laterality: N/A;  . REDUCTION MAMMAPLASTY Bilateral   . TOE SURGERY     removal  of bone in each foot  . TUBAL LIGATION      Social History   Socioeconomic History  . Marital status: Single    Spouse name: Not on file  . Number of children: Not on file  . Years of education: Not on file  . Highest education level: Not on file  Occupational History  . Not on file  Social Needs  . Financial resource strain: Not on file  . Food insecurity:    Worry: Not on file    Inability: Not on file  . Transportation needs:    Medical: Not on file    Non-medical: Not on file  Tobacco Use  . Smoking status: Former Smoker    Years: 7.00    Types: Cigars    Last attempt to quit: 08/11/2008    Years since quitting: 9.3  . Smokeless tobacco: Never Used  . Tobacco comment: smoked [employer], 1 pack per day (5 in a pack)  Substance and Sexual Activity  . Alcohol use: Yes    Alcohol/week: 3.0 oz    Types: 5 Cans of beer per week  . Drug use: Never  . Sexual activity: Not Currently    Birth control/protection: Surgical  Lifestyle  . Physical activity:    Days per week: Not on file    Minutes per session: Not on file  . Stress: Not on file  Relationships  . Social connections:    Talks on phone: Not on file    Gets together: Not on file    Attends religious service: Not on file    Active member of club or organization: Not on file    Attends meetings of clubs or organizations: Not on file    Relationship status: Not on file  . Intimate partner violence:    Fear of current or ex partner: Not on file    Emotionally abused: Not on file    Physically abused: Not on file    Forced sexual activity: Not on file  Other Topics Concern  . Not on file  Social History Narrative   Epworth sleepiness score as of 06/11/15 a 4   Exercise: yes, active at work, no formal exercise   Single, never married, in relationship off and on   Children: [place], [place] (both in [place]), 2 grandkids.  Raised 2 nieces [name], [address] 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 Hypertension Mother   . Heart disease Mother   . Cancer Maternal Grandmother   . Stomach cancer Paternal Grandmother   . Colon cancer Neg Hx      Review of Systems  Constitutional: Negative.  Negative for chills and fever.  HENT: Positive for congestion and sore throat. Negative for ear pain and nosebleeds.   Eyes: Positive for discharge and redness.  Respiratory: Positive for cough. Negative for hemoptysis, shortness of breath and wheezing.   Cardiovascular: Negative for chest pain.  Gastrointestinal: Negative.  Negative for abdominal pain, diarrhea, nausea and vomiting.  Genitourinary: Negative.  Negative for dysuria and hematuria.  Musculoskeletal: Negative for back pain, myalgias and neck pain.  Skin: Negative for rash.  Neurological: Negative for headaches.  Endo/Heme/Allergies: Negative.   All other systems reviewed and are negative.   Vitals:   12/15/17 1011  Pulse: 95  Resp: 16  Temp: 99.2 F (37.3 C)  SpO2: 95%    Physical Exam  Constitutional: She is oriented to person, place, and time. She appears well-developed and well-nourished.  HENT:  Head: Normocephalic and atraumatic.  Mouth/Throat: Uvula is midline. No uvula swelling. Posterior oropharyngeal edema and posterior oropharyngeal erythema present. No oropharyngeal exudate or tonsillar abscesses.  Eyes: Pupils are equal, round, and reactive to light. EOM are normal. Right conjunctiva is injected. Left conjunctiva is not injected.  Neck: Normal range of motion. Neck supple.  Cardiovascular: Normal rate, regular rhythm and normal heart sounds.  Pulmonary/Chest: Effort normal and breath sounds normal.  Musculoskeletal: Normal range of motion.  Lymphadenopathy:    She has no cervical adenopathy.  Neurological: She is alert and oriented to person, place, and time. No  sensory deficit. She exhibits normal muscle tone.  Skin: Skin is warm and dry. Capillary refill takes less than 2 seconds.  Psychiatric: She has a normal mood and affect. Her behavior is normal.  Vitals reviewed.  A total of 25 minutes was spent in the room with the patient, greater than 50% of which was in counseling/coordination of care regarding differential diagnosis, management, medications, and need for follow-up.  ASSESSMENT & PLAN: Michelle Huerta was seen today for cough and sore throat.  Diagnoses and all orders for this visit:  Cough -     promethazine-codeine (PHENERGAN WITH CODEINE) 6.25-10 MG/5ML syrup; Take 5 mLs by mouth at bedtime as needed for cough.  Lower resp. tract infection -     azithromycin (ZITHROMAX) 250 MG tablet; Sig as indicated  Acute conjunctivitis of right eye, unspecified acute conjunctivitis type -     trimethoprim-polymyxin b (POLYTRIM) ophthalmic solution; Place 2 drops into the right eye every 4 (four) hours for 5 days.    Patient Instructions       IF you received an x-ray today, you will receive an invoice from [facility]. Please contact [facility] at [phone] with questions or concerns regarding your invoice.   IF you received labwork today, you will receive an invoice from [facility]. Please contact [facility] at [phone] with questions or concerns regarding your invoice.   Our billing staff will not be able to assist you with questions regarding bills from these companies.  You will be contacted with the lab results as soon as they are available. The fastest way to get your results is to activate your My Chart account. Instructions are located on the last page of this paperwork. If you have not heard from [place] regarding the results in 2 weeks, please contact this office.     Bacterial Conjunctivitis Bacterial conjunctivitis is an infection of your conjunctiva. This is the clear membrane that covers the white part of your  eye and the inner surface of your eyelid. This condition can make your eye:  Red or pink.  Itchy.  This condition is caused by bacteria. This condition spreads very easily from person to person (is contagious) and from one eye to the other eye. Follow these instructions at home: Medicines  Take or apply your antibiotic medicine as told by your doctor. Do not stop taking or applying the antibiotic even if you start to feel better.  Take or apply over-the-counter and prescription medicines only as told by your doctor.  Do not touch your eyelid with the eye drop bottle or the ointment tube. Managing discomfort  Wipe any fluid from your eye with a warm, wet washcloth or a cotton ball.  Place a cool, clean washcloth on your eye. Do this for 10-20 minutes, 3-4 times per day. General instructions  Do not wear contact lenses until the irritation is gone. Wear glasses until your doctor says it is okay to wear contacts.  Do not wear eye makeup until your symptoms are gone. Throw away any old makeup.  Change or wash your pillowcase every day.  Do not share towels or washcloths with anyone.  Wash your hands often with soap and water. Use paper towels to dry your hands.  Do not touch or rub your eyes.  Do not drive or use heavy machinery if your vision is blurry. Contact a doctor if:  You have a fever.  Your symptoms do not get better after 10 days. Get help right away if:  You have a fever and your symptoms suddenly get worse.  You have very bad pain when you move your eye.  Your face: ? Hurts. ? Is red. ? Is swollen.  You have sudden loss of vision. This information is not intended to replace advice given to you by your health care provider. Make sure you discuss any questions you have with your health care provider. Document Released: 04/05/2008 Document Revised: 12/03/2015 Document Reviewed: 04/09/2015 Elsevier Interactive Patient Education  2018 [employer].  Cough,  Adult A cough helps to clear your throat and lungs. A cough may last only 2-3 weeks (acute), or it may last longer than 8 weeks (chronic). Many different things can cause a cough. A cough may be a sign of an illness or another medical condition. Follow these instructions at home:  Pay attention to any changes in your cough.  Take medicines only as told by your doctor. ? If you were prescribed an antibiotic medicine, take it as told by your doctor. Do not stop taking it even if you start to feel better. ? Talk with your doctor before you try using a cough medicine.  Drink enough fluid to keep your pee (urine) clear or pale yellow.  If the air is dry, use a cold steam vaporizer or humidifier in your home.  Stay away from things that make you cough at work or at home.  If your cough is worse at night, try using extra pillows to raise your head up higher while you sleep.  Do not smoke, and try not to be around smoke. If you need help quitting, ask your doctor.  Do not have caffeine.  Do not drink alcohol.  Rest as needed. Contact a doctor if:  You have new problems (symptoms).  You cough up yellow fluid (pus).  Your cough does not get better after 2-3 weeks, or your cough gets worse.  Medicine does not help your cough and you are not sleeping well.  You have pain that gets worse or pain that is not  helped with medicine.  You have a fever.  You are losing weight and you do not know why.  You have night sweats. Get help right away if:  You cough up blood.  You have trouble breathing.  Your heartbeat is very fast. This information is not intended to replace advice given to you by your health care provider. Make sure you discuss any questions you have with your health care provider. Document Released: 03/10/2011 Document Revised: 12/03/2015 Document Reviewed: 09/03/2014 Elsevier Interactive Patient Education  2018 Elsevier Inc.      Agustina Caroli, MD [facility]

## 2017-12-18 ENCOUNTER — Ambulatory Visit (INDEPENDENT_AMBULATORY_CARE_PROVIDER_SITE_OTHER): Payer: 59 | Admitting: Physician Assistant

## 2017-12-18 ENCOUNTER — Encounter: Payer: Self-pay | Admitting: Physician Assistant

## 2017-12-18 ENCOUNTER — Other Ambulatory Visit: Payer: Self-pay

## 2017-12-18 VITALS — BP 116/74 | HR 88 | Temp 98.7°F | Resp 16 | Ht 67.0 in | Wt 295.8 lb

## 2017-12-18 DIAGNOSIS — K5900 Constipation, unspecified: Secondary | ICD-10-CM | POA: Diagnosis not present

## 2017-12-18 NOTE — Progress Notes (Signed)
Michelle Huerta  MRN: [id number] DOB: [birth year]  PCP: Harlan Stains, MD  Subjective:  Pt is a 57 year old female who presents to clinic for f/u side pain. She was here on 5/29 for this problem and treated with Miralax. She has not been able to take this regularly as she recently was sick and developed a sore throat, so she cannot drink much.  Today she reports that her side pain has improved some. She no longer has pain with bending over to put her sock and shoes on. She endorses still being constipated.  Denies abdominal distention, fever, chills, abdominal pain, n/v/d, chest pain, palpitations.     Review of Systems  Constitutional: Negative for chills, diaphoresis, fatigue and fever.  HENT: Positive for congestion and sore throat. Negative for postnasal drip, rhinorrhea, sinus pressure and sinus pain.   Respiratory: Positive for cough. Negative for chest tightness and shortness of breath.   Cardiovascular: Negative for chest pain and palpitations.  Gastrointestinal: Positive for abdominal pain (side pain, improved) and constipation. Negative for diarrhea, nausea and vomiting.  Musculoskeletal: Negative for back pain.    Patient Active Problem List   Diagnosis Date Noted  . Cough 12/15/2017  . Lower resp. tract infection 12/15/2017  . Acute conjunctivitis of right eye 12/15/2017  . OSA on CPAP 10/28/2015  . Fibroids 08/07/2015  . Plantar fasciitis, bilateral 08/03/2015  . Metatarsal deformity 08/03/2015  . Equinus deformity of foot, acquired 08/03/2015  . Pronation deformity of both feet 08/03/2015  . Morbid obesity ([place]) 06/11/2015  . 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 Essential (primary) hypertension 06/11/2015  . 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 Benign essential HTN 09/19/2013  . Major depressive episode 09/19/2013    Current Outpatient Medications on File Prior to Visit  Medication Sig Dispense Refill  . amLODipine (NORVASC) 5 MG tablet     . azithromycin  (ZITHROMAX) 250 MG tablet Sig as indicated 6 tablet 0  . Estradiol 10 MCG TABS vaginal tablet 1 pill per vagina every Mon, Wed, Friday 30 tablet 12  . fluticasone (FLONASE) 50 MCG/ACT nasal spray Place into both nostrils daily.    . fluticasone (FLONASE) 50 MCG/ACT nasal spray Place 2 sprays into both nostrils daily. 16 g 6  . Hydroxychloroquine Sulfate (PLAQUENIL PO) Take by mouth.    . promethazine-codeine (PHENERGAN WITH CODEINE) 6.25-10 MG/5ML syrup Take 5 mLs by mouth at bedtime as needed for cough. 120 mL 0  . telmisartan (MICARDIS) 80 MG tablet Take 80 mg by mouth daily.  1  . trimethoprim-polymyxin b (POLYTRIM) ophthalmic solution Place 2 drops into the right eye every 4 (four) hours for 5 days. 10 mL 1  . valACYclovir (VALTREX) 500 MG tablet Take 500 mg by mouth daily.    . polyethylene glycol powder (GLYCOLAX/MIRALAX) powder Take 17 g by mouth 2 (two) times daily as needed. (Patient not taking: Reported on 12/18/2017) 578 g 1   No current facility-administered medications on file prior to visit.     Allergies  Allergen Reactions  . Penicillins Rash  . Reclast [Zoledronic Acid] Nausea And Vomiting     Objective:  BP 116/74 (BP Location: Left Arm, Patient Position: Sitting, Cuff Size: Large)   Pulse 88   Temp 98.7 F (37.1 C) (Oral)   Resp 16   Ht 5\' 7"  (1.702 m)   Wt 295 lb 12.8 oz (134.2 kg)   LMP 08/01/2012   SpO2 98%   BMI 46.33 kg/m   Physical  Exam  Constitutional: She is oriented to person, place, and time. Vital signs are normal. No distress.  obese  Abdominal: Normal appearance. There is no tenderness. There is no rigidity and no guarding.  Central obesity.   Neurological: She is alert and oriented to person, place, and time.  Skin: Skin is warm and dry.  Psychiatric: Judgment normal.  Vitals reviewed.   Assessment and Plan :  1. Constipation, unspecified constipation type - Pt presents for f/u abdominal pain and constipation. Her condition is improving  overall, however she has not been able to take Miralax regularly 2/2 recent sore throat. Advised Cepacol. Start taking Miralax 1-2 times daily. RTC PRN if symptoms worsen. Spent 15 mins with the patient - greater than 50% of the visit was face to face counseling patient regarding treatment and prevention of constipation.      Mercer Pod, PA-C  [facility] 12/18/2017 8:22 AM

## 2017-12-18 NOTE — Patient Instructions (Addendum)
  Cepacol for sore throat.   Stay well hydrated. Get lost of rest. Wash your hands often.   -Foods that can help speed recovery: honey, garlic, chicken soup, elderberries, green tea.  -Supplements that can help speed recovery: vitamin C, zinc, elderberry extract, quercetin, ginseng, selenium -Supplement with prebiotics and probiotics.   Drink enough water and fluids to keep your urine clear or pale yellow.  For sore throat: ? Gargle with 8 oz of salt water ( tsp of salt per 1 qt of water) as often as every 1-2 hours to soothe your throat.  Gargle liquid benadryl.   For sore throat try using a honey-based tea. Use 3 teaspoons of honey with juice squeezed from half lemon. Place shaved pieces of ginger into 1/2-1 cup of water and warm over stove top. Then mix the ingredients and repeat every 4 hours as needed.  Is there anything I can do on my own to get rid of my cough? Yes. To help get rid of your cough, you can: ?Use a humidifier in your bedroom ?Use an over-the-counter cough medicine, or suck on cough drops or hard candy ?Stop smoking, if you smoke ?If you have allergies, avoid the things you are allergic to (like pollen, dust, animals, or mold)    Come back if you are not improving.   Thank you for coming in today. I hope you feel we met your needs.  Feel free to call PCP if you have any questions or further requests.  Please consider signing up for MyChart if you do not already have it, as this is a great way to communicate with me.  Best,  Whitney McVey, PA-C  IF you received an x-ray today, you will receive an invoice from [facility]. Please contact [facility] at [phone] with questions or concerns regarding your invoice.   IF you received labwork today, you will receive an invoice from [place]. Please contact [facility] at [phone] with questions or concerns regarding your invoice.   Our billing staff will not be able to assist you with  questions regarding bills from these companies.  You will be contacted with the lab results as soon as they are available. The fastest way to get your results is to activate your My Chart account. Instructions are located on the last page of this paperwork. If you have not heard from [place] regarding the results in 2 weeks, please contact this office.

## 2017-12-22 ENCOUNTER — Other Ambulatory Visit: Payer: Self-pay

## 2017-12-22 ENCOUNTER — Ambulatory Visit: Payer: 59 | Admitting: Physician Assistant

## 2017-12-22 ENCOUNTER — Encounter: Payer: Self-pay | Admitting: Physician Assistant

## 2017-12-22 VITALS — BP 120/82 | HR 90 | Temp 98.4°F | Resp 16 | Ht 66.5 in | Wt 301.6 lb

## 2017-12-22 DIAGNOSIS — G8929 Other chronic pain: Secondary | ICD-10-CM | POA: Diagnosis not present

## 2017-12-22 DIAGNOSIS — M25562 Pain in left knee: Secondary | ICD-10-CM | POA: Diagnosis not present

## 2017-12-22 DIAGNOSIS — M25561 Pain in right knee: Secondary | ICD-10-CM | POA: Diagnosis not present

## 2017-12-22 MED ORDER — MELOXICAM 7.5 MG PO TABS: 7.5000 mg | ORAL_TABLET | Freq: Every day | ORAL | 1 refills | Status: DC

## 2017-12-22 NOTE — Patient Instructions (Addendum)
  Start taking Meloxicam. Do not use with any other otc pain medication other than tylenol/acetaminophen - so no aleve, ibuprofen, motrin, advil, etc.  You will get a phone call to schedule an appointment with sports medicine.   Thank you for coming in today. I hope you feel we met your needs.  Feel free to call PCP if you have any questions or further requests.  Please consider signing up for MyChart if you do not already have it, as this is a great way to communicate with me.  Best,  Whitney McVey, PA-C   IF you received an x-ray today, you will receive an invoice from [facility]. Please contact [facility] at [phone] with questions or concerns regarding your invoice.   IF you received labwork today, you will receive an invoice from [place]. Please contact [facility] at [phone] with questions or concerns regarding your invoice.   Our billing staff will not be able to assist you with questions regarding bills from these companies.  You will be contacted with the lab results as soon as they are available. The fastest way to get your results is to activate your My Chart account. Instructions are located on the last page of this paperwork. If you have not heard from [place] regarding the results in 2 weeks, please contact this office.

## 2017-12-22 NOTE — Progress Notes (Signed)
Michelle Huerta  MRN: [id number] DOB: 1961/05/14  PCP: Michelle Hiss, PA-C  Subjective:  Pt is a 57 year old female who presents to clinic for acute on chronic right knee pain.  Knee pain x several years. 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 Walking makes knee pain worse. Pain is worse R>L.  She went to PT last year, but didn't feel like it helped much. She went to a PCP in [place] but believes this practitioner is no longer there. She is asking for FMLA paperwork to be filled out today.    Review of Systems  Constitutional: Negative for chills and fever.  Musculoskeletal: Positive for arthralgias (right knee) and gait problem.  Skin: Negative.     Patient Active Problem List   Diagnosis Date Noted  . Constipation 12/18/2017  . Cough 12/15/2017  . Lower resp. tract infection 12/15/2017  . Acute conjunctivitis of right eye 12/15/2017  . OSA on CPAP 10/28/2015  . Fibroids 08/07/2015  . Plantar fasciitis, bilateral 08/03/2015  . Metatarsal deformity 08/03/2015  . Equinus deformity of foot, acquired 08/03/2015  . Pronation deformity of both feet 08/03/2015  . Morbid obesity ([place]) 06/11/2015  . 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 Essential (primary) hypertension 06/11/2015  . 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 Benign essential HTN 09/19/2013  . Major depressive episode 09/19/2013    Current Outpatient Medications on File Prior to Visit  Medication Sig Dispense Refill  . amLODipine (NORVASC) 5 MG tablet     . Estradiol 10 MCG TABS vaginal tablet 1 pill per vagina every Mon, Wed, Friday 30 tablet 12  . fluticasone (FLONASE) 50 MCG/ACT nasal spray Place into both nostrils daily.    . fluticasone (FLONASE) 50 MCG/ACT nasal spray Place 2 sprays into both nostrils daily. 16 g 6  . Hydroxychloroquine Sulfate (PLAQUENIL PO) Take by mouth.    . polyethylene glycol powder (GLYCOLAX/MIRALAX) powder Take 17 g by mouth 2 (two)  times daily as needed. 578 g 1  . telmisartan (MICARDIS) 80 MG tablet Take 80 mg by mouth daily.  1  . valACYclovir (VALTREX) 500 MG tablet Take 500 mg by mouth daily.    . promethazine-codeine (PHENERGAN WITH CODEINE) 6.25-10 MG/5ML syrup Take 5 mLs by mouth at bedtime as needed for cough. (Patient not taking: Reported on 12/22/2017) 120 mL 0   No current facility-administered medications on file prior to visit.     Allergies  Allergen Reactions  . Penicillins Rash  . Reclast [Zoledronic Acid] Nausea And Vomiting     Objective:  BP 120/82 (BP Location: Left Arm, Patient Position: Sitting, Cuff Size: Large)   Pulse 90   Temp 98.4 F (36.9 C) (Oral)   Resp 16   Ht 5' 6.5" (1.689 m)   Wt (!) 301 lb 9.6 oz (136.8 kg)   LMP 08/01/2012   SpO2 97%   BMI 47.95 kg/m   Physical Exam  Constitutional: She is oriented to person, place, and time. Vital signs are normal. No distress.  obese  Cardiovascular: Normal rate, regular rhythm and normal heart sounds.  Musculoskeletal:       Right knee: She exhibits normal range of motion, no swelling, normal patellar mobility, no bony tenderness, normal meniscus and no MCL laxity. No tenderness found.       Left knee: She exhibits normal range of motion, no swelling, no bony tenderness and normal meniscus. No tenderness found.  Difficult knee  exam due to pt's body habitus.   Neurological: She is alert and oriented to person, place, and time.  Skin: Skin is warm and dry.  Psychiatric: Judgment normal.  Vitals reviewed.   Assessment and Plan :  1. Chronic pain of both knees - pt presents c/o acute on chronic knee pain and is asking for FMLA forms to be filled out. This is my first time seeing this pt, advised her to f/u with her former PCP's office for forms to be filled out. Plan to refer to sports medicine for evaluation of knee pain.  - Ambulatory referral to Sports Medicine - meloxicam (MOBIC) 7.5 MG tablet; Take 1-2 tablets (7.5-15 mg total)  by mouth daily.  Dispense: 60 tablet; Refill: 1   Whitney Jamella Grayer, PA-C  [facility] 12/22/2017 5:44 PM

## 2017-12-27 ENCOUNTER — Encounter: Payer: Self-pay | Admitting: Family Medicine

## 2017-12-27 ENCOUNTER — Ambulatory Visit: Payer: 59 | Admitting: Family Medicine

## 2017-12-27 VITALS — BP 144/87 | Ht 66.5 in | Wt 301.0 lb

## 2017-12-27 DIAGNOSIS — G8929 Other chronic pain: Secondary | ICD-10-CM

## 2017-12-27 DIAGNOSIS — M25561 Pain in right knee: Secondary | ICD-10-CM | POA: Diagnosis not present

## 2017-12-27 DIAGNOSIS — M25562 Pain in left knee: Secondary | ICD-10-CM | POA: Diagnosis not present

## 2017-12-27 MED ORDER — METHYLPREDNISOLONE ACETATE 40 MG/ML IJ SUSP
40.0000 mg | Freq: Once | INTRAMUSCULAR | Status: AC
  Administered 2017-12-27: 40 mg via INTRA_ARTICULAR

## 2017-12-27 NOTE — Patient Instructions (Signed)
Your pain is due to arthritis. These are the different medications you can take for this: Tylenol 500mg  1-2 tabs three times a day for pain. Capsaicin, aspercreme, or biofreeze topically up to four times a day may also help with pain. Some supplements that may help for arthritis: Boswellia extract, curcumin, pycnogenol Meloxicam 15mg  daily with food OR Aleve 1-2 tabs twice a day with food Cortisone injections are an option - you were given these today. If cortisone injections do not help, there are different types of shots that may help but they take longer to take effect. It's important that you continue to stay active. Straight leg raises, knee extensions 3 sets of 10 once a day (add ankle weight if these become too easy). Start physical therapy to strengthen muscles around the joint that hurts to take pressure off of the joint itself - we will refer you to the [facility] location on [address] inserts with good arch support may be helpful. Heat or ice 15 minutes at a time 3-4 times a day as needed to help with pain. Water aerobics and cycling with low resistance are the best two types of exercise for arthritis though any exercise is ok as long as it doesn't worsen the pain. Follow up with me in 5-6 weeks.

## 2017-12-28 ENCOUNTER — Encounter: Payer: Self-pay | Admitting: Family Medicine

## 2017-12-28 DIAGNOSIS — M25561 Pain in right knee: Secondary | ICD-10-CM | POA: Insufficient documentation

## 2017-12-28 DIAGNOSIS — M25562 Pain in left knee: Secondary | ICD-10-CM

## 2017-12-28 NOTE — Assessment & Plan Note (Signed)
2/2 arthritis.  Independently reviewed radiographs of tib/fib from 10/2016 and noted mild joint space loss medially.  History, exam consistent with this as cause of pain.  Discussed tylenol, topical medications, supplements that may help, aleve or meloxicam.  Injections given today.  She will start physical therapy and do home exercises as well.  F/u in 5-6 weeks.  After informed written consent timeout was performed, patient was seated on exam table. Right knee was prepped with alcohol swab and utilizing anteromedial approach, patient's right knee was injected intraarticularly with 3:1 marcaine: depomedrol. Patient tolerated the procedure well without immediate complications.  After informed written consent timeout was performed, patient was seated on exam table. Left knee was prepped with alcohol swab and utilizing anteromedial approach, patient's left knee was injected intraarticularly with 3:1 marcaine: depomedrol. Patient tolerated the procedure well without immediate complications.

## 2017-12-28 NOTE — Progress Notes (Signed)
PCP and consultation requested by: Dorise Hiss, PA-C  Subjective:   HPI: Patient is a 57 y.o. female here for bilateral knee pain.  Patient reports she's had off and on pain in her knees for about 4 years. No new injury or trauma. She reports pain in knees is worse on the right side, better on left. Has tried meloxicam, tylenol, voltaren without benefit. Pain better with rest. Sharp and worse with walking. Has history of lupus and paget's disease. No skin changes, numbness.  Past Medical History:  Diagnosis Date  . Allergy   . Asthma    no medicines  . Bilateral chronic knee pain   . Chronic back pain   . DDD (degenerative disc disease), cervical   . Depression    no meds  . Genital herpes    HSV type 2 at rectum, culture positive 3/15  . Glaucoma   . Heart murmur   . Hypertension   . Insomnia   . Lupus ([place])   . Mild sleep apnea    1/17 CPAP Dr Claiborne Billings  . Morbid obesity ([place]) 06/11/2015  . Osteoporosis   . Paget's disease   . Paget's disease of bone    Dr Trudie Reed, Reclast insufion 02/14/10 - bad reaction to infusion, now tolerates fosamax weekly  . Sleep apnea   . Thrombocytopenia ([place])   . Tubular adenoma polyp of rectum    8/12, Dr Fuller Plan, 5 yr follow up   . Vitamin D deficiency     Current Outpatient Medications on File Prior to Visit  Medication Sig Dispense Refill  . amLODipine (NORVASC) 5 MG tablet     . Estradiol 10 MCG TABS vaginal tablet 1 pill per vagina every Mon, Wed, Friday 30 tablet 12  . fluticasone (FLONASE) 50 MCG/ACT nasal spray Place into both nostrils daily.    . fluticasone (FLONASE) 50 MCG/ACT nasal spray Place 2 sprays into both nostrils daily. 16 g 6  . Hydroxychloroquine Sulfate (PLAQUENIL PO) Take by mouth.    . meloxicam (MOBIC) 7.5 MG tablet Take 1-2 tablets (7.5-15 mg total) by mouth daily. 60 tablet 1  . polyethylene glycol powder (GLYCOLAX/MIRALAX) powder Take 17 g by mouth 2 (two) times daily as needed. 578 g 1  .  promethazine-codeine (PHENERGAN WITH CODEINE) 6.25-10 MG/5ML syrup Take 5 mLs by mouth at bedtime as needed for cough. (Patient not taking: Reported on 12/22/2017) 120 mL 0  . telmisartan (MICARDIS) 80 MG tablet Take 80 mg by mouth daily.  1  . valACYclovir (VALTREX) 500 MG tablet Take 500 mg by mouth daily.     No current facility-administered medications on file prior to visit.     Past Surgical History:  Procedure Laterality Date  . AUGMENTATION MAMMAPLASTY Bilateral   . BREAST ENHANCEMENT SURGERY    . BREAST REDUCTION SURGERY    . DILATION AND CURETTAGE OF UTERUS N/A 06/24/2015   Procedure: DILATATION AND CURETTAGE;  Surgeon: Emily Filbert, MD;  Location: [facility];  Service: Gynecology;  Laterality: N/A;  . REDUCTION MAMMAPLASTY Bilateral   . TOE SURGERY     removal of bone in each foot  . TUBAL LIGATION      Allergies  Allergen Reactions  . Penicillins Rash  . Reclast [Zoledronic Acid] Nausea And Vomiting    Social History   Socioeconomic History  . Marital status: Single    Spouse name: Not on file  . Number of children: Not on file  . Years of education: Not on  file  . Highest education level: Not on file  Occupational History  . Not on file  Social Needs  . Financial resource strain: Not on file  . Food insecurity:    Worry: Not on file    Inability: Not on file  . Transportation needs:    Medical: Not on file    Non-medical: Not on file  Tobacco Use  . Smoking status: Former Smoker    Years: 7.00    Types: Cigars    Last attempt to quit: 08/11/2008    Years since quitting: 9.3  . Smokeless tobacco: Never Used  . Tobacco comment: smoked Black&Milds, 1 pack per day (5 in a pack)  Substance and Sexual Activity  . Alcohol use: Yes    Alcohol/week: 3.0 oz    Types: 5 Cans of beer per week  . Drug use: Never  . Sexual activity: Not Currently    Birth control/protection: Surgical  Lifestyle  . Physical activity:    Days per week: Not on file    Minutes per  session: Not on file  . Stress: Not on file  Relationships  . Social connections:    Talks on phone: Not on file    Gets together: Not on file    Attends religious service: Not on file    Active member of club or organization: Not on file    Attends meetings of clubs or organizations: Not on file    Relationship status: Not on file  . Intimate partner violence:    Fear of current or ex partner: Not on file    Emotionally abused: Not on file    Physically abused: Not on file    Forced sexual activity: Not on file  Other Topics Concern  . Not on file  Social History Narrative   Epworth sleepiness score as of 06/11/15 a 4   Exercise: yes, active at work, no formal exercise   Single, never married, in relationship off and on   Children: [place], [place] (both in [place]), 2 grandkids.  Raised 2 nieces [name], [address] 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 Hypertension Mother   . Heart disease Mother   . Cancer Maternal Grandmother   . Stomach cancer Paternal Grandmother   . Colon cancer Neg Hx     BP (!) 144/87   Ht 5' 6.5" (1.689 m)   Wt (!) 301 lb (136.5 kg)   LMP 08/01/2012   BMI 47.85 kg/m   Review of Systems: See HPI above.     Objective:  Physical Exam:  Gen: NAD, comfortable in exam room  Right knee: No gross deformity, ecchymoses, swelling. TTP medial joint line.  No other tenderness. FROM with 5/5 strength. Negative ant/post drawers. Negative valgus/varus testing. Negative lachmanns. Negative mcmurrays, apleys, patellar apprehension. NV intact distally.  Left knee: No gross deformity, ecchymoses, swelling. TTP medial joint line.  No other tenderness. FROM with 5/5 strength. Negative ant/post drawers. Negative valgus/varus testing. Negative lachmanns. Negative mcmurrays, apleys, patellar  apprehension. NV intact distally.   Assessment & Plan:  1. Bilateral knee pain - 2/2 arthritis.  Independently reviewed radiographs of tib/fib from 10/2016 and noted mild joint space loss medially.  History, exam consistent with this as cause of pain.  Discussed tylenol, topical medications, supplements that may help, aleve  or meloxicam.  Injections given today.  She will start physical therapy and do home exercises as well.  F/u in 5-6 weeks.  After informed written consent timeout was performed, patient was seated on exam table. Right knee was prepped with alcohol swab and utilizing anteromedial approach, patient's right knee was injected intraarticularly with 3:1 marcaine: depomedrol. Patient tolerated the procedure well without immediate complications.  After informed written consent timeout was performed, patient was seated on exam table. Left knee was prepped with alcohol swab and utilizing anteromedial approach, patient's left knee was injected intraarticularly with 3:1 marcaine: depomedrol. Patient tolerated the procedure well without immediate complications.

## 2017-12-31 ENCOUNTER — Encounter (HOSPITAL_COMMUNITY): Payer: Self-pay | Admitting: Emergency Medicine

## 2017-12-31 ENCOUNTER — Other Ambulatory Visit: Payer: Self-pay

## 2017-12-31 ENCOUNTER — Ambulatory Visit (HOSPITAL_COMMUNITY)
Admission: EM | Admit: 2017-12-31 | Discharge: 2017-12-31 | Disposition: A | Discharge status: Home / Self Care | Payer: 59 | Attending: Family Medicine | Admitting: Family Medicine

## 2017-12-31 DIAGNOSIS — S39012A Strain of muscle, fascia and tendon of lower back, initial encounter: Secondary | ICD-10-CM | POA: Diagnosis not present

## 2017-12-31 DIAGNOSIS — M222X9 Patellofemoral disorders, unspecified knee: Secondary | ICD-10-CM | POA: Diagnosis not present

## 2017-12-31 MED ORDER — PREDNISONE 20 MG PO TABS: ORAL_TABLET | ORAL | 0 refills | Status: DC

## 2017-12-31 NOTE — Discharge Instructions (Addendum)
Start doing 2 exercises: 1.  Quadriceps strengthening exercises by extending knees while sitting on exercise machine or table at least once a day 2.  Twist and bend to the side to strengthen and loosen back muscles that are hurting several times a day.

## 2017-12-31 NOTE — ED Triage Notes (Signed)
The patient presented to the [facility] with a complaint of right knee pain. The patient denied any known injury but did report getting "a shot" in both knees 4 days ago.

## 2017-12-31 NOTE — ED Provider Notes (Signed)
[facility]   [id number] 12/31/17 Arrival Time: 1638   SUBJECTIVE:  Michelle Huerta is a 57 y.o. female who presents to the urgent care with complaint of right knee pain. The patient denied any known injury but did report getting "a shot" in both knees 4 days ago.  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  The pain is relatively recent.  It is associated with walking stiffly because going up and down stairs is so uncomfortable.   The pain began a few months ago.  It is associated with crepitus bilaterally.  Note from office visit 6/19: 2/2 arthritis.  Independently reviewed radiographs of tib/fib from 10/2016 and noted mild joint space loss medially.  History, exam consistent with this as cause of pain.  Discussed tylenol, topical medications, supplements that may help, aleve or meloxicam.  Injections given today.  She will start physical therapy and do home exercises as well.  F/u in 5-6 weeks.  After informed written consent timeout was performed, patient was seated on exam table. Right knee was prepped with alcohol swab and utilizing anteromedial approach, patient's right knee was injected intraarticularly with 3:1 marcaine: depomedrol. Patient tolerated the procedure well without immediate complications.    Past Medical History:  Diagnosis Date  . Allergy   . Asthma    no medicines  . Bilateral chronic knee pain   . Chronic back pain   . DDD (degenerative disc disease), cervical   . Depression    no meds  . Genital herpes    HSV type 2 at rectum, culture positive 3/15  . Glaucoma   . Heart murmur   . Hypertension   . Insomnia   . Lupus ([facility])   . Mild sleep apnea    1/17 CPAP Dr Kelly  . Morbid obesity ([facility]) 06/11/2015  . Osteoporosis   . Paget's disease   . Paget's disease of bone    Dr Hawkes, Reclast insufion 02/14/10 - bad reaction to infusion, now tolerates fosamax weekly  . Sleep apnea   . Thrombocytopenia ([facility])   . Tubular adenoma  polyp of rectum    8/12, Dr Stark, 5 yr follow up   . Vitamin D deficiency    Family History  Problem Relation Age of Onset  . Hypertension Mother   . Heart disease Mother   . Cancer Maternal Grandmother   . Stomach cancer Paternal Grandmother   . Colon cancer Neg Hx    Social History   Socioeconomic History  . Marital status: Single    Spouse name: Not on file  . Number of children: Not on file  . Years of education: Not on file  . Highest education level: Not on file  Occupational History  . Not on file  Social Needs  . Financial resource strain: Not on file  . Food insecurity:    Worry: Not on file    Inability: Not on file  . Transportation needs:    Medical: Not on file    Non-medical: Not on file  Tobacco Use  . Smoking status: Former Smoker    Years: 7.00    Types: Cigars    Last attempt to quit: 08/11/2008    Years since quitting: 9.3  . Smokeless tobacco: Never Used  . Tobacco comment: smoked Black&Milds, 1 pack per day (5 in a pack)  Substance and Sexual Activity  . Alcohol use: Yes    Alcohol/week: 3.0 oz      Types: 5 Cans of beer per week  . Drug use: Never  . Sexual activity: Not Currently    Birth control/protection: Surgical  Lifestyle  . Physical activity:    Days per week: Not on file    Minutes per session: Not on file  . Stress: Not on file  Relationships  . Social connections:    Talks on phone: Not on file    Gets together: Not on file    Attends religious service: Not on file    Active member of club or organization: Not on file    Attends meetings of clubs or organizations: Not on file    Relationship status: Not on file  . Intimate partner violence:    Fear of current or ex partner: Not on file    Emotionally abused: Not on file    Physically abused: Not on file    Forced sexual activity: Not on file  Other Topics Concern  . Not on file  Social History Narrative   Epworth sleepiness score as of 06/11/15 a 4   Exercise: yes, active  at work, no formal exercise   Single, never married, in relationship off and on   Children: [place], [place] (both in [place]), 2 grandkids.  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   Allergies  Allergen Reactions  . Penicillins Rash  . Reclast [Zoledronic Acid] Nausea And Vomiting      ROS: As per HPI, remainder of ROS negative.   OBJECTIVE:   Vitals:   12/31/17 1700  BP: (!) 149/75  Pulse: 86  Resp: 18  Temp: 98 F (36.7 C)  TempSrc: Oral  SpO2: 99%     General appearance: alert; no distress Eyes: PERRL; EOMI; conjunctiva normal HENT: normocephalic; atraumatic;  oral mucosa normal Neck: supple Back: no CVA tenderness Extremities: no cyanosis or edema; symmetrical with no gross deformities;  Pain with pressure on patellae when straightening the legs.  Crepitus in both knees without major effusion.  No heberden's nodes in fingers/ Skin: warm and dry Neurologic: normal gait; grossly normal Psychological: alert and cooperative; normal mood and affect      Labs:  Results for orders placed or performed in visit on 12/06/17  CMP14+EGFR  Result Value Ref Range   Glucose 106 (H) 65 - 99 mg/dL   BUN 21 6 - 24 mg/dL   Creatinine, Ser 1.01 (H) 0.57 - 1.00 mg/dL   GFR calc non Af Amer 63 >59 mL/min/1.73   GFR calc Af Amer 72 >59 mL/min/1.73   BUN/Creatinine Ratio 21 9 - 23   Sodium 143 134 - 144 mmol/L   Potassium 4.7 3.5 - 5.2 mmol/L   Chloride 107 (H) 96 - 106 mmol/L   CO2 23 20 - 29 mmol/L   Calcium 9.2 8.7 - 10.2 mg/dL   Total Protein 7.8 6.0 - 8.5 g/dL   Albumin 4.2 3.5 - 5.5 g/dL   Globulin, Total 3.6 1.5 - 4.5 g/dL   Albumin/Globulin Ratio 1.2 1.2 - 2.2   Bilirubin Total 0.4 0.0 - 1.2 mg/dL   Alkaline Phosphatase 92 39 -  117 IU/L   AST 13 0 - 40 IU/L   ALT 18 0 - 32 IU/L  CBC with Differential/Platelet  Result Value Ref   Range   WBC 11.1 (H) 3.4 - 10.8 x10E3/uL   RBC 4.53 3.77 - 5.28 x10E6/uL   Hemoglobin 12.3 11.1 - 15.9 g/dL   Hematocrit 38.0 34.0 - 46.6 %   MCV 84 79 - 97 fL   MCH 27.2 26.6 - 33.0 pg   MCHC 32.4 31.5 - 35.7 g/dL   RDW 15.8 (H) 12.3 - 15.4 %   Platelets 196 150 - 450 x10E3/uL   Neutrophils 77 Not Estab. %   Lymphs 20 Not Estab. %   Monocytes 2 Not Estab. %   Eos 0 Not Estab. %   Basos 0 Not Estab. %   Neutrophils Absolute 8.5 (H) 1.4 - 7.0 x10E3/uL   Lymphocytes Absolute 2.3 0.7 - 3.1 x10E3/uL   Monocytes Absolute 0.2 0.1 - 0.9 x10E3/uL   EOS (ABSOLUTE) 0.0 0.0 - 0.4 x10E3/uL   Basophils Absolute 0.0 0.0 - 0.2 x10E3/uL   Immature Granulocytes 1 Not Estab. %   Immature Grans (Abs) 0.1 0.0 - 0.1 x10E3/uL    Labs Reviewed - No data to display  No results found.     ASSESSMENT & PLAN:  1. Patellofemoral disorder, unspecified laterality   2. Back strain, initial encounter   exercises for quad strengthening and back stretching explained and demonstrated.  Meds ordered this encounter  Medications  . predniSONE (DELTASONE) 20 MG tablet    Sig: 1 daily with food    Dispense:  7 tablet    Refill:  0    Reviewed expectations re: course of current medical issues. Questions answered. Outlined signs and symptoms indicating need for more acute intervention. Patient verbalized understanding. After Visit Summary given.    Procedures:      Robyn Haber, MD 12/31/17 1737

## 2018-01-01 MED FILL — predniSONE 20 MG TABS: 20 | 7 days supply | Qty: 7 | Fill #0

## 2018-01-02 ENCOUNTER — Encounter: Payer: Self-pay | Admitting: Physician Assistant

## 2018-01-02 ENCOUNTER — Ambulatory Visit: Payer: 59 | Admitting: Physician Assistant

## 2018-01-02 VITALS — BP 137/86 | HR 85 | Temp 98.2°F | Resp 17 | Ht 66.5 in | Wt 298.0 lb

## 2018-01-02 DIAGNOSIS — I10 Essential (primary) hypertension: Secondary | ICD-10-CM | POA: Diagnosis not present

## 2018-01-02 DIAGNOSIS — M170 Bilateral primary osteoarthritis of knee: Secondary | ICD-10-CM | POA: Diagnosis not present

## 2018-01-02 DIAGNOSIS — G8929 Other chronic pain: Secondary | ICD-10-CM

## 2018-01-02 DIAGNOSIS — M25561 Pain in right knee: Secondary | ICD-10-CM | POA: Diagnosis not present

## 2018-01-02 MED ORDER — TELMISARTAN 80 MG PO TABS: 80.0000 mg | ORAL_TABLET | Freq: Every day | ORAL | 1 refills | Status: DC

## 2018-01-02 MED ORDER — AMLODIPINE BESYLATE 5 MG PO TABS: 5.0000 mg | ORAL_TABLET | Freq: Every day | ORAL | 1 refills | Status: DC

## 2018-01-02 NOTE — Patient Instructions (Addendum)
Come back and see me in 2-3 months.   Next PAP in 04/2020  Thank you for coming in today. I hope you feel we met your needs.  Feel free to call PCP if you have any questions or further requests.  Please consider signing up for MyChart if you do not already have it, as this is a great way to communicate with me.  Best,  Whitney McVey, PA-C  IF you received an x-ray today, you will receive an invoice from [facility]. Please contact [facility] at [phone] with questions or concerns regarding your invoice.   IF you received labwork today, you will receive an invoice from [place] [facility]. Please contact [facility] at [phone] with questions or concerns regarding your invoice.   Our billing staff will not be able to assist you with questions regarding bills from these companies.  You will be contacted with the lab results as soon as they are available. The fastest way to get your results is to activate your My Chart account. Instructions are located on the last page of this paperwork. If you have not heard from [place] regarding the results in 2 weeks, please contact this office.

## 2018-01-02 NOTE — Progress Notes (Signed)
Michelle Huerta  MRN: [id number] DOB: 02-07-1961  PCP: Dorise Hiss, PA-C  Subjective:  Pt is a 57 year old female who presents to clinic for right knee pain. This is a chronic problem for her.   She was seen at sports medicine 12/27/2017. Joint space loss determined to be the cause. Steroid injections administered at that time. Plan: "She will start physical therapy and do home exercises as well.  F/u in 5-6 weeks" she was given a knee brace at this appt, but it doesn't work "it just falls off when I start walking".   She presented to urgent care 6/23 for the same problem and Rx for prednisone was given. This did not help for longer than a few days.   Has tried meloxicam, tylenol, voltaren without benefit.  Occupation: [employer]  HTN - needs refills of medications. Blood pressure today is 137/86  Review of Systems  Constitutional: Negative for chills, diaphoresis, fatigue and fever.  Respiratory: Negative for cough, chest tightness and shortness of breath.   Musculoskeletal: Positive for arthralgias (right knee), gait problem and joint swelling.    Patient Active Problem List   Diagnosis Date Noted  . Bilateral knee pain 12/28/2017  . Constipation 12/18/2017  . Cough 12/15/2017  . Lower resp. tract infection 12/15/2017  . Acute conjunctivitis of right eye 12/15/2017  . H/O Paget's disease of bone 09/10/2017  . OSA on CPAP 10/28/2015  . Fibroids 08/07/2015  . Plantar fasciitis, bilateral 08/03/2015  . Metatarsal deformity 08/03/2015  . Equinus deformity of foot, acquired 08/03/2015  . Pronation deformity of both feet 08/03/2015  . Morbid obesity ([place]) 06/11/2015  . 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 Essential (primary) hypertension 06/11/2015  . 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 Benign essential HTN 09/19/2013  . Major depressive episode 09/19/2013    Current Outpatient Medications on File Prior to Visit  Medication Sig Dispense Refill   . amLODipine (NORVASC) 5 MG tablet     . Estradiol 10 MCG TABS vaginal tablet 1 pill per vagina every Mon, Wed, Friday 30 tablet 12  . fluticasone (FLONASE) 50 MCG/ACT nasal spray Place into both nostrils daily.    . fluticasone (FLONASE) 50 MCG/ACT nasal spray Place 2 sprays into both nostrils daily. 16 g 6  . Hydroxychloroquine Sulfate (PLAQUENIL PO) Take by mouth.    . meloxicam (MOBIC) 7.5 MG tablet Take 1-2 tablets (7.5-15 mg total) by mouth daily. 60 tablet 1  . polyethylene glycol powder (GLYCOLAX/MIRALAX) powder Take 17 g by mouth 2 (two) times daily as needed. 578 g 1  . predniSONE (DELTASONE) 20 MG tablet 1 daily with food 7 tablet 0  . telmisartan (MICARDIS) 80 MG tablet Take 80 mg by mouth daily.  1  . valACYclovir (VALTREX) 500 MG tablet Take 500 mg by mouth daily.     No current facility-administered medications on file prior to visit.     Allergies  Allergen Reactions  . Penicillins Rash  . Reclast [Zoledronic Acid] Nausea And Vomiting     Objective:  BP 137/86   Pulse 85   Temp 98.2 F (36.8 C) (Oral)   Resp 17   Ht 5' 6.5" (1.689 m)   Wt 298 lb (135.2 kg)   LMP 08/01/2012   SpO2 98%   BMI 47.38 kg/m   Physical Exam  Constitutional: She is oriented to person, place, and time. Vital signs are normal. No distress.  obese  Neurological: She is alert and oriented  to person, place, and time.  Skin: Skin is warm and dry.  Psychiatric: Judgment normal.  Vitals reviewed.  Assessment and Plan :  1. Arthritis of both knees 2. Chronic pain of right knee - Pt presents for nonresolving knee pain. She has been seen at sports medicine and acute care for this problem. Plans to start physical therapy. Next OV with sports medicine in 5 weeks. Spider knee brace applied today. She endorses improvement. RTC in 2 months for recheck.   3. Essential hypertension - Blood pressure today is 137/86. Okay to refill medications.  - amLODipine (NORVASC) 5 MG tablet; Take 1 tablet  (5 mg total) by mouth daily.  Dispense: 90 tablet; Refill: 1 - telmisartan (MICARDIS) 80 MG tablet; Take 1 tablet (80 mg total) by mouth daily.  Dispense: 90 tablet; Refill: 1   Whitney Cylan Borum, PA-C  [facility] 01/02/2018 5:36 PM

## 2018-01-03 MED FILL — AMLODIPINE BESYLATE 5 MG TA: 5 | 90 days supply | Qty: 90 | Fill #0

## 2018-01-03 MED FILL — TELMISARTAN 80 MG TABS: 80 | 90 days supply | Qty: 90 | Fill #0

## 2018-01-08 ENCOUNTER — Telehealth: Payer: Self-pay | Admitting: Physician Assistant

## 2018-01-08 NOTE — Telephone Encounter (Signed)
mychart message sent to pt about FMLA forms

## 2018-01-10 ENCOUNTER — Ambulatory Visit: Payer: 59 | Attending: Family Medicine | Admitting: Physical Therapy

## 2018-01-10 ENCOUNTER — Other Ambulatory Visit: Payer: Self-pay

## 2018-01-10 ENCOUNTER — Encounter: Payer: Self-pay | Admitting: Physical Therapy

## 2018-01-10 DIAGNOSIS — R262 Difficulty in walking, not elsewhere classified: Secondary | ICD-10-CM | POA: Insufficient documentation

## 2018-01-10 DIAGNOSIS — M25652 Stiffness of left hip, not elsewhere classified: Secondary | ICD-10-CM | POA: Insufficient documentation

## 2018-01-10 DIAGNOSIS — M6281 Muscle weakness (generalized): Secondary | ICD-10-CM | POA: Diagnosis not present

## 2018-01-10 DIAGNOSIS — M25651 Stiffness of right hip, not elsewhere classified: Secondary | ICD-10-CM | POA: Diagnosis not present

## 2018-01-10 DIAGNOSIS — M25661 Stiffness of right knee, not elsewhere classified: Secondary | ICD-10-CM | POA: Insufficient documentation

## 2018-01-10 NOTE — Therapy (Signed)
[place], [place], [address] Phone: [phone]   Fax:  [phone]  Physical Therapy Evaluation  Patient Details  Name: Michelle Huerta MRN: [id number] Date of Birth: Sep 24, 1960 Referring Provider: Dr. Karlton Lemon    Encounter Date: 01/10/2018  PT End of Session - 01/10/18 1355    Visit Number  1    Number of Visits  12    Date for PT Re-Evaluation  02/21/18    PT Start Time  1302    PT Stop Time  1353    PT Time Calculation (min)  51 min    Activity Tolerance  Patient tolerated treatment well    Behavior During Therapy  [facility] for tasks assessed/performed       Past Medical History:  Diagnosis Date  . Allergy   . Asthma    no medicines  . Bilateral chronic knee pain   . Chronic back pain   . DDD (degenerative disc disease), cervical   . Depression    no meds  . Genital herpes    HSV type 2 at rectum, culture positive 3/15  . Glaucoma   . Heart murmur   . Hypertension   . Insomnia   . Lupus ([place])   . Mild sleep apnea    1/17 CPAP Dr Claiborne Billings  . Morbid obesity ([place]) 06/11/2015  . Osteoporosis   . Paget's disease   . Paget's disease of bone    Dr Trudie Reed, Reclast insufion 02/14/10 - bad reaction to infusion, now tolerates fosamax weekly  . Sleep apnea   . Thrombocytopenia ([place])   . Tubular adenoma polyp of rectum    8/12, Dr Fuller Plan, 5 yr follow up   . Vitamin D deficiency     Past Surgical History:  Procedure Laterality Date  . AUGMENTATION MAMMAPLASTY Bilateral   . BREAST ENHANCEMENT SURGERY    . BREAST REDUCTION SURGERY    . DILATION AND CURETTAGE OF UTERUS N/A 06/24/2015   Procedure: DILATATION AND CURETTAGE;  Surgeon: Emily Filbert, MD;  Location: [facility];  Service: Gynecology;  Laterality: N/A;  . REDUCTION MAMMAPLASTY Bilateral   . TOE SURGERY     removal of bone in each foot  . TUBAL LIGATION      There were no vitals filed for this visit.   Subjective Assessment - 01/10/18 1310     Subjective  Patient has had chronic knee pain off and on for several years.  About 3 weeks ago the Rt knee began to bother her and the pain worsened.  She feels a sharp pain in her knee, felt popping, creaking as she walked.  She has L knee pain bit it has not been bothering her.  Received injection about 2 weeks ago which helped the Rt knee somewhat. The brace she was given does not fit. She was told she had fluid on her knee.  She has difficulty lifting, squatting and turning too fast when she is at work.  She has difficulty walking up and down stairs in the community.      Pertinent History  Paget's Disease, Lupus     Limitations  Lifting;Standing;Walking;House hold activities;Other (comment) sit to stand when she has been sitting long periods     How long can you walk comfortably?  has to be on her feet for a couple hours at a time     Diagnostic tests  XR done last yr none recent    Patient Stated Goals  Pain improvement     Currently in Pain?  Yes    Pain Score  1  with work 4/10    Pain Location  Knee    Pain Orientation  Right;Anterior    Pain Descriptors / Indicators  Aching    Pain Type  Chronic pain    Pain Onset  More than a month ago    Pain Frequency  Intermittent    Aggravating Factors   crossing legs, walking, stairs     Pain Relieving Factors  rest, sitting, has eased up          [facility] PT Assessment - 01/10/18 0001      Assessment   Medical Diagnosis  knee pain R>L     Referring Provider  Dr. Karlton Lemon     Onset Date/Surgical Date  -- chronic    Next MD Visit  4 weeks    Prior Therapy  No       Precautions   Precautions  None      Restrictions   Weight Bearing Restrictions  No      Balance Screen   Has the patient fallen in the past 6 months  No      [place] residence    Living Arrangements  Alone;Children;Other (Comment)    Type of Home  House    Home Access  Level entry    Home Layout  One level    Additional  Comments  son moving out this weekend       Prior Function   Level of Independence  Independent    Vocation  Full time employment    Vocation Requirements  housekeeping, walking       Cognition   Overall Cognitive Status  Within Functional Limits for tasks assessed      Observation/Other Assessments   Focus on Therapeutic Outcomes (FOTO)   62%      Observation/Other Assessments-Edema    Edema  Circumferential did not measure due to ace bandage and jeans , reports none       Sensation   Light Touch  Appears Intact      Coordination   Gross Motor Movements are Fluid and Coordinated  Not tested      Functional Tests   Functional tests  Squat;Single leg stance      Squat   Comments  pain       Single Leg Stance   Comments  10-15 sec       Posture/Postural Control   Posture/Postural Control  Postural limitations    Posture Comments  wide BOS, genu varus      AROM   Right Knee Extension  0    Right Knee Flexion  105    Left Knee Extension  0    Left Knee Flexion  120      Strength   Overall Strength Comments  knees 5/5    Right Hip Flexion  4/5    Right Hip ABduction  4-/5    Left Hip Flexion  4/5    Left Hip ABduction  4-/5      Flexibility   Hamstrings  tight    Quadriceps  tight      Palpation   Patella mobility  hypomobile R>L     Palpation comment  non tender throughout       Special Tests   Other special tests  ligaments intact       Patellofemoral Grind  test (Clark's Sign)   Findings  Negative      Transfers   Five time sit to stand comments   26 sec     Comments  --      Ambulation/Gait   Ambulation Distance (Feet)  150 Feet    Assistive device  None    Gait Pattern  Antalgic    Ambulation Surface  Level;Indoor    Gait velocity  slower than avg                 Objective measurements completed on examination: See above findings.      [facility] Adult PT Treatment/Exercise - 01/10/18 0001      Self-Care   Self-Care  Heat/Ice  Application;Other Self-Care Comments    Heat/Ice Application  ice post     Other Self-Care Comments   patellar mobs, HEP       Knee/Hip Exercises: Stretches   Active Hamstring Stretch  Both;2 reps    Gastroc Stretch  2 reps      Knee/Hip Exercises: Standing   Heel Raises  Both;1 set    Heel Raises Limitations  x 10 needs UE assist       Knee/Hip Exercises: Sidelying   Hip ABduction  Strengthening;Both;1 set             PT Education - 01/10/18 1533    Education Details  PT/POC, HEP, ice for pain , inflammation , to bring in brace for fit     Person(s) Educated  Patient    Methods  Explanation    Comprehension  Verbalized understanding;Returned demonstration          PT Long Term Goals - 01/10/18 1534      PT LONG TERM GOAL #1   Title  Pt will be I with HEP     Time  6    Period  Weeks    Status  New    Target Date  02/21/18      PT LONG TERM GOAL #2   Title  Pt will score 45% or better on FOTO to demo improved functional mobility.     Time  6    Period  Weeks    Status  New    Target Date  02/21/18      PT LONG TERM GOAL #3   Title  Pt will be able to work with pain 25% better with squatting, bending to clean.     Time  6    Period  Weeks    Status  New    Target Date  02/21/18      PT LONG TERM GOAL #4   Title  Pt will be able to ascend and descend stairs with 1 rail and good knee control, pain no more than minimal     Time  6    Period  Weeks    Status  New    Target Date  02/21/18      PT LONG TERM GOAL #5   Title  Pt will increase hip strength to 4+/5 or better for improved knee stability with gait mechanics.     Time  6    Period  Weeks    Status  New    Target Date  02/21/18             Plan - 01/10/18 1536    Clinical Impression Statement  Pt presents for low complexity eval of R knee pain which has been a chronic  issue for many years.  She would benefit from aquatics to work toward weight loss, strengthening.  She has good knee  strength but is stiff in her ankles, knees and hips.  She was unable to do a heel raise with knees extended.  She will benefit from PT to maximize her ability to work with pain controlled.      History and Personal Factors relevant to plan of care:  Lupus, Paget's disease    Clinical Presentation  Stable    Clinical Decision Making  Low    Rehab Potential  Excellent    PT Frequency  2x / week    PT Duration  6 weeks    PT Treatment/Interventions  ADLs/Self Care Home Management;Cryotherapy;Ultrasound;Moist Heat;Iontophoresis 4mg /ml Dexamethasone;Electrical Stimulation;DME Instruction;Gait training;Stair training;Balance training;Therapeutic exercise;Therapeutic activities;Functional mobility training;Neuromuscular re-education;Patient/family education;Manual techniques;Passive range of motion;Taping    PT Next Visit Plan  HEP review , bike vs Nustep , consider tape patellar mobs     PT Home Exercise Plan  calf and hamstring stretch, heel raises and hip abd     Consulted and Agree with Plan of Care  Patient       Patient will benefit from skilled therapeutic intervention in order to improve the following deficits and impairments:  Decreased balance, Decreased endurance, Decreased mobility, Difficulty walking, Hypomobility, Pain, Postural dysfunction, Impaired flexibility, Increased fascial restricitons, Decreased strength, Obesity, Decreased range of motion, Abnormal gait  Visit Diagnosis: Stiffness of right knee, not elsewhere classified  Stiffness of left hip, not elsewhere classified  Stiffness of right hip, not elsewhere classified  Muscle weakness (generalized)  Difficulty in walking, not elsewhere classified     Problem List Patient Active Problem List   Diagnosis Date Noted  . Bilateral knee pain 12/28/2017  . Constipation 12/18/2017  . Cough 12/15/2017  . Lower resp. tract infection 12/15/2017  . Acute conjunctivitis of right eye 12/15/2017  . H/O Paget's disease of bone  09/10/2017  . OSA on CPAP 10/28/2015  . Fibroids 08/07/2015  . Plantar fasciitis, bilateral 08/03/2015  . Metatarsal deformity 08/03/2015  . Equinus deformity of foot, acquired 08/03/2015  . Pronation deformity of both feet 08/03/2015  . Morbid obesity ([place]) 06/11/2015  . 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 Essential (primary) hypertension 06/11/2015  . 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 Benign essential HTN 09/19/2013  . Major depressive episode 09/19/2013    Amaury Kuzel 01/10/2018, 3:46 PM  [facility] [address] [place], [place], [address] Phone: [phone]   Fax:  [phone]  Name: TAMEAKA EICHHORN MRN: [id number] Date of Birth: 1960-12-14   Raeford Razor, PT 01/10/18 3:47 PM Phone: [phone] Fax: [phone]

## 2018-01-15 NOTE — Telephone Encounter (Signed)
Contacted [employer] and told them pt had not been removed from work by any provider here at this office and that she would need to come in to make an apt if she needed these forms completed.

## 2018-01-24 ENCOUNTER — Encounter: Payer: Self-pay | Admitting: Physical Therapy

## 2018-01-24 ENCOUNTER — Ambulatory Visit: Payer: 59 | Admitting: Physical Therapy

## 2018-01-24 DIAGNOSIS — M25652 Stiffness of left hip, not elsewhere classified: Secondary | ICD-10-CM | POA: Diagnosis not present

## 2018-01-24 DIAGNOSIS — R262 Difficulty in walking, not elsewhere classified: Secondary | ICD-10-CM | POA: Diagnosis not present

## 2018-01-24 DIAGNOSIS — M6281 Muscle weakness (generalized): Secondary | ICD-10-CM

## 2018-01-24 DIAGNOSIS — M25651 Stiffness of right hip, not elsewhere classified: Secondary | ICD-10-CM | POA: Diagnosis not present

## 2018-01-24 DIAGNOSIS — M25661 Stiffness of right knee, not elsewhere classified: Secondary | ICD-10-CM

## 2018-01-24 NOTE — Patient Instructions (Signed)
  Quad Set   Slowly tighten muscles on thigh of straight leg while counting out loud to ___5_. Repeat with other leg. Repeat _10___ times. Do _2___ sessions per day.  Heel Slide   Bend knee and pull heel toward buttocks. Hold __5__ seconds. Return. Repeat with other knee. Repeat __10-20__ times. Do __2__ sessions per day.  http://gt2.exer.us/372   Copyright  VHI. All rights reserved.   Copyright  VHI. All rights reserved.  HIP: Flexion / KNEE: Extension, Straight Leg Raise   Raise leg, keeping knee straight. Perform slowly. _10__ reps per set, _2__ sets per session, _7__ days per week   Copyright  VHI. All rights reserved.      Raise leg until knee is straight. 10-20___ reps per set, __2_ sets per day, _7__ days per week

## 2018-01-24 NOTE — Therapy (Signed)
[place], [place], [address] Phone: [phone]   Fax:  [phone]  Physical Therapy Treatment  Patient Details  Name: Michelle Huerta MRN: [id number] Date of Birth: 18-Jun-1961 Referring Provider: Dr. Karlton Lemon    Encounter Date: 01/24/2018  PT End of Session - 01/24/18 0811    Visit Number  2    Number of Visits  12    Date for PT Re-Evaluation  02/21/18    PT Start Time  0805    PT Stop Time  0845    PT Time Calculation (min)  40 min       Past Medical History:  Diagnosis Date  . Allergy   . Asthma    no medicines  . Bilateral chronic knee pain   . Chronic back pain   . DDD (degenerative disc disease), cervical   . Depression    no meds  . Genital herpes    HSV type 2 at rectum, culture positive 3/15  . Glaucoma   . Heart murmur   . Hypertension   . Insomnia   . Lupus ([place])   . Mild sleep apnea    1/17 CPAP Dr Claiborne Billings  . Morbid obesity ([place]) 06/11/2015  . Osteoporosis   . Paget's disease   . Paget's disease of bone    Dr Trudie Reed, Reclast insufion 02/14/10 - bad reaction to infusion, now tolerates fosamax weekly  . Sleep apnea   . Thrombocytopenia ([place])   . Tubular adenoma polyp of rectum    8/12, Dr Fuller Plan, 5 yr follow up   . Vitamin D deficiency     Past Surgical History:  Procedure Laterality Date  . AUGMENTATION MAMMAPLASTY Bilateral   . BREAST ENHANCEMENT SURGERY    . BREAST REDUCTION SURGERY    . DILATION AND CURETTAGE OF UTERUS N/A 06/24/2015   Procedure: DILATATION AND CURETTAGE;  Surgeon: Emily Filbert, MD;  Location: [facility];  Service: Gynecology;  Laterality: N/A;  . REDUCTION MAMMAPLASTY Bilateral   . TOE SURGERY     removal of bone in each foot  . TUBAL LIGATION      There were no vitals filed for this visit.  Subjective Assessment - 01/24/18 0809    Subjective  Right knee is popping more. Pain after sitting and then need to get up.     Currently in Pain?  Yes     Pain Score  4     Pain Orientation  Right;Anterior    Pain Descriptors / Indicators  Sore;Tightness shooting    Aggravating Factors   getting up after sitting     Pain Relieving Factors  rest, not sitting too long                       [facility] Adult PT Treatment/Exercise - 01/24/18 0001      Knee/Hip Exercises: Stretches   Active Hamstring Stretch  Both;2 reps    Gastroc Stretch  2 reps      Knee/Hip Exercises: Standing   Heel Raises  Both;1 set;20 reps      Knee/Hip Exercises: Seated   Long Arc Quad  10 reps;Right;Left      Knee/Hip Exercises: Supine   Quad Sets  10 reps;Right;Left    Short Arc Quad Sets  20 reps    Heel Slides  10 reps      Knee/Hip Exercises: Sidelying   Hip ABduction  Strengthening;Both;1 set;10 reps  PT Education - 01/24/18 0844    Education Details  HEP, LEvel 1 knee    Person(s) Educated  Patient    Methods  Explanation;Handout    Comprehension  Verbalized understanding          PT Long Term Goals - 01/10/18 1534      PT LONG TERM GOAL #1   Title  Pt will be I with HEP     Time  6    Period  Weeks    Status  New    Target Date  02/21/18      PT LONG TERM GOAL #2   Title  Pt will score 45% or better on FOTO to demo improved functional mobility.     Time  6    Period  Weeks    Status  New    Target Date  02/21/18      PT LONG TERM GOAL #3   Title  Pt will be able to work with pain 25% better with squatting, bending to clean.     Time  6    Period  Weeks    Status  New    Target Date  02/21/18      PT LONG TERM GOAL #4   Title  Pt will be able to ascend and descend stairs with 1 rail and good knee control, pain no more than minimal     Time  6    Period  Weeks    Status  New    Target Date  02/21/18      PT LONG TERM GOAL #5   Title  Pt will increase hip strength to 4+/5 or better for improved knee stability with gait mechanics.     Time  6    Period  Weeks    Status  New    Target Date   02/21/18            Plan - 01/24/18 0855    Clinical Impression Statement  Pt lost HEP so we reviewed all exercises requiring max cues. Progress to Level 1 knee strength. No increased pain at end of session.     PT Next Visit Plan  HEP review , bike vs Nustep , consider tape patellar mobs     PT Home Exercise Plan  calf and hamstring stretch, heel raises and hip abd     Consulted and Agree with Plan of Care  Patient       Patient will benefit from skilled therapeutic intervention in order to improve the following deficits and impairments:  Decreased balance, Decreased endurance, Decreased mobility, Difficulty walking, Hypomobility, Pain, Postural dysfunction, Impaired flexibility, Increased fascial restricitons, Decreased strength, Obesity, Decreased range of motion, Abnormal gait  Visit Diagnosis: Stiffness of right knee, not elsewhere classified  Stiffness of left hip, not elsewhere classified  Stiffness of right hip, not elsewhere classified  Muscle weakness (generalized)  Difficulty in walking, not elsewhere classified     Problem List Patient Active Problem List   Diagnosis Date Noted  . Bilateral knee pain 12/28/2017  . Constipation 12/18/2017  . Cough 12/15/2017  . Lower resp. tract infection 12/15/2017  . Acute conjunctivitis of right eye 12/15/2017  . H/O Paget's disease of bone 09/10/2017  . OSA on CPAP 10/28/2015  . Fibroids 08/07/2015  . Plantar fasciitis, bilateral 08/03/2015  . Metatarsal deformity 08/03/2015  . Equinus deformity of foot, acquired 08/03/2015  . Pronation deformity of both feet 08/03/2015  . Morbid obesity ([place])  06/11/2015  . 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 Essential (primary) hypertension 06/11/2015  . 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 Benign essential HTN 09/19/2013  . Major depressive episode 09/19/2013    Dorene Ar, PTA 01/24/2018, 9:17 AM  [place], [place], [address] Phone: [phone]   Fax:  [phone]  Name: Michelle Huerta MRN: [id number] Date of Birth: Nov 22, 1960

## 2018-01-29 ENCOUNTER — Ambulatory Visit: Payer: 59 | Admitting: Physical Therapy

## 2018-01-29 DIAGNOSIS — G4733 Obstructive sleep apnea (adult) (pediatric): Secondary | ICD-10-CM | POA: Diagnosis not present

## 2018-01-29 DIAGNOSIS — G4721 Circadian rhythm sleep disorder, delayed sleep phase type: Secondary | ICD-10-CM | POA: Diagnosis not present

## 2018-02-05 MED FILL — VALACYCLOVIR HCL 500 MG TAB: 500 | 90 days supply | Qty: 90 | Fill #1

## 2018-02-07 ENCOUNTER — Encounter: Payer: Self-pay | Admitting: Physical Therapy

## 2018-02-07 ENCOUNTER — Ambulatory Visit: Payer: 59 | Admitting: Physical Therapy

## 2018-02-07 ENCOUNTER — Ambulatory Visit: Payer: 59 | Admitting: Family Medicine

## 2018-02-07 DIAGNOSIS — M25652 Stiffness of left hip, not elsewhere classified: Secondary | ICD-10-CM | POA: Diagnosis not present

## 2018-02-07 DIAGNOSIS — R262 Difficulty in walking, not elsewhere classified: Secondary | ICD-10-CM

## 2018-02-07 DIAGNOSIS — M6281 Muscle weakness (generalized): Secondary | ICD-10-CM

## 2018-02-07 DIAGNOSIS — M25651 Stiffness of right hip, not elsewhere classified: Secondary | ICD-10-CM

## 2018-02-07 DIAGNOSIS — M25661 Stiffness of right knee, not elsewhere classified: Secondary | ICD-10-CM

## 2018-02-07 NOTE — Patient Instructions (Signed)
SIT TO STAND: Feet Narrow     Lean chest forward. Raise hips and straighten knees to stand. ( reach hands out in front ) _10__ reps per set, _2__ sets per day, __7 days per week Hold onto a support.

## 2018-02-07 NOTE — Therapy (Signed)
[place], [place], [address] Phone: [phone]   Fax:  [phone]  Physical Therapy Treatment  Patient Details  Name: Michelle Huerta MRN: [id number] Date of Birth: 04/27/1961 Referring Provider: Dr. Karlton Lemon    Encounter Date: 02/07/2018  PT End of Session - 02/07/18 0859    Visit Number  3    Number of Visits  12    Date for PT Re-Evaluation  02/21/18    PT Start Time  0848    PT Stop Time  0927    PT Time Calculation (min)  39 min       Past Medical History:  Diagnosis Date  . Allergy   . Asthma    no medicines  . Bilateral chronic knee pain   . Chronic back pain   . DDD (degenerative disc disease), cervical   . Depression    no meds  . Genital herpes    HSV type 2 at rectum, culture positive 3/15  . Glaucoma   . Heart murmur   . Hypertension   . Insomnia   . Lupus ([place])   . Mild sleep apnea    1/17 CPAP Dr Claiborne Billings  . Morbid obesity ([place]) 06/11/2015  . Osteoporosis   . Paget's disease   . Paget's disease of bone    Dr Trudie Reed, Reclast insufion 02/14/10 - bad reaction to infusion, now tolerates fosamax weekly  . Sleep apnea   . Thrombocytopenia ([place])   . Tubular adenoma polyp of rectum    8/12, Dr Fuller Plan, 5 yr follow up   . Vitamin D deficiency     Past Surgical History:  Procedure Laterality Date  . AUGMENTATION MAMMAPLASTY Bilateral   . BREAST ENHANCEMENT SURGERY    . BREAST REDUCTION SURGERY    . DILATION AND CURETTAGE OF UTERUS N/A 06/24/2015   Procedure: DILATATION AND CURETTAGE;  Surgeon: Emily Filbert, MD;  Location: [facility];  Service: Gynecology;  Laterality: N/A;  . REDUCTION MAMMAPLASTY Bilateral   . TOE SURGERY     removal of bone in each foot  . TUBAL LIGATION      There were no vitals filed for this visit.  Subjective Assessment - 02/07/18 0854    Subjective  I did not do  the exercises. I had family here.  Can you print them again?    Currently in Pain?  Yes    Pain Score  3     Pain Location  Knee    Pain Orientation  Right;Anterior    Pain Descriptors / Indicators  Sore;Tightness    Aggravating Factors   getting up after sitting , standing and walking prolonged    Pain Relieving Factors  rest, not sitting too long                        [facility] Adult PT Treatment/Exercise - 02/07/18 0001      Knee/Hip Exercises: Stretches   Active Hamstring Stretch  Both;2 reps    Gastroc Stretch  2 reps;Right;Left      Knee/Hip Exercises: Aerobic   Nustep  L5 LE only x 5 minutes       Knee/Hip Exercises: Standing   Heel Raises  10 reps;20 reps    Heel Raises Limitations  toe raises x 10       Knee/Hip Exercises: Seated   Sit to Sand  10 reps;without UE support difficult , cues for eccentric control  Knee/Hip Exercises: Supine   Quad Sets  10 reps;Right;Left;20 reps    Short Arc Quad Sets  10 reps;Left;Right with ball squeeze - difficult    Heel Slides  10 reps;Left;Right    Bridges  10 reps    Bridges with [employer]  10 reps    Straight Leg Raises  10 reps;Right;Left      Knee/Hip Exercises: Sidelying   Hip ABduction  Strengthening;Both;1 set;10 reps;2 sets             PT Education - 02/07/18 0917    Education Details  HEP    Person(s) Educated  Patient    Methods  Explanation;Handout    Comprehension  Verbalized understanding          PT Long Term Goals - 01/10/18 1534      PT LONG TERM GOAL #1   Title  Pt will be I with HEP     Time  6    Period  Weeks    Status  New    Target Date  02/21/18      PT LONG TERM GOAL #2   Title  Pt will score 45% or better on FOTO to demo improved functional mobility.     Time  6    Period  Weeks    Status  New    Target Date  02/21/18      PT LONG TERM GOAL #3   Title  Pt will be able to work with pain 25% better with squatting, bending to clean.     Time  6    Period  Weeks    Status  New    Target Date  02/21/18      PT LONG TERM GOAL #4   Title  Pt  will be able to ascend and descend stairs with 1 rail and good knee control, pain no more than minimal     Time  6    Period  Weeks    Status  New    Target Date  02/21/18      PT LONG TERM GOAL #5   Title  Pt will increase hip strength to 4+/5 or better for improved knee stability with gait mechanics.     Time  6    Period  Weeks    Status  New    Target Date  02/21/18            Plan - 02/07/18 4166    Clinical Impression Statement  Pt reports losing HEP again and non compliance thus far. Reprinted and reviewed HEP thus far. Worked on sit to stands with eccentric control. Began Nustep with good tolerance.     PT Next Visit Plan  HEP review , bike vs Nustep , consider tape patellar mobs     PT Home Exercise Plan  calf and hamstring stretch, heel raises and hip abd , sit-stand , LAQ, SLR, heel slides , QS    Consulted and Agree with Plan of Care  Patient       Patient will benefit from skilled therapeutic intervention in order to improve the following deficits and impairments:  Decreased balance, Decreased endurance, Decreased mobility, Difficulty walking, Hypomobility, Pain, Postural dysfunction, Impaired flexibility, Increased fascial restricitons, Decreased strength, Obesity, Decreased range of motion, Abnormal gait  Visit Diagnosis: Stiffness of right knee, not elsewhere classified  Stiffness of left hip, not elsewhere classified  Stiffness of right hip, not elsewhere classified  Muscle weakness (generalized)  Difficulty  in walking, not elsewhere classified     Problem List Patient Active Problem List   Diagnosis Date Noted  . Bilateral knee pain 12/28/2017  . Constipation 12/18/2017  . Cough 12/15/2017  . Lower resp. tract infection 12/15/2017  . Acute conjunctivitis of right eye 12/15/2017  . H/O Paget's disease of bone 09/10/2017  . OSA on CPAP 10/28/2015  . Fibroids 08/07/2015  . Plantar fasciitis, bilateral 08/03/2015  . Metatarsal deformity 08/03/2015   . Equinus deformity of foot, acquired 08/03/2015  . Pronation deformity of both feet 08/03/2015  . Morbid obesity ([place]) 06/11/2015  . 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 Essential (primary) hypertension 06/11/2015  . 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 Benign essential HTN 09/19/2013  . Major depressive episode 09/19/2013    Dorene Ar, PTA 02/07/2018, 11:10 AM  [facility] [address] [place], [place], [address] Phone: [phone]   Fax:  [phone]  Name: Michelle Huerta MRN: [id number] Date of Birth: Jun 10, 1961

## 2018-02-12 ENCOUNTER — Encounter: Payer: Self-pay | Admitting: Physical Therapy

## 2018-02-12 ENCOUNTER — Telehealth: Payer: Self-pay | Admitting: Physician Assistant

## 2018-02-12 ENCOUNTER — Ambulatory Visit: Payer: 59 | Attending: Family Medicine | Admitting: Physical Therapy

## 2018-02-12 DIAGNOSIS — M79604 Pain in right leg: Secondary | ICD-10-CM | POA: Diagnosis not present

## 2018-02-12 DIAGNOSIS — M25651 Stiffness of right hip, not elsewhere classified: Secondary | ICD-10-CM | POA: Diagnosis not present

## 2018-02-12 DIAGNOSIS — M170 Bilateral primary osteoarthritis of knee: Secondary | ICD-10-CM | POA: Diagnosis not present

## 2018-02-12 DIAGNOSIS — L930 Discoid lupus erythematosus: Secondary | ICD-10-CM | POA: Diagnosis not present

## 2018-02-12 DIAGNOSIS — M25661 Stiffness of right knee, not elsewhere classified: Secondary | ICD-10-CM | POA: Insufficient documentation

## 2018-02-12 DIAGNOSIS — M25569 Pain in unspecified knee: Secondary | ICD-10-CM | POA: Diagnosis not present

## 2018-02-12 DIAGNOSIS — M6281 Muscle weakness (generalized): Secondary | ICD-10-CM | POA: Diagnosis not present

## 2018-02-12 DIAGNOSIS — R262 Difficulty in walking, not elsewhere classified: Secondary | ICD-10-CM | POA: Diagnosis not present

## 2018-02-12 DIAGNOSIS — M25652 Stiffness of left hip, not elsewhere classified: Secondary | ICD-10-CM | POA: Insufficient documentation

## 2018-02-12 DIAGNOSIS — M889 Osteitis deformans of unspecified bone: Secondary | ICD-10-CM | POA: Diagnosis not present

## 2018-02-12 DIAGNOSIS — M1990 Unspecified osteoarthritis, unspecified site: Secondary | ICD-10-CM | POA: Diagnosis not present

## 2018-02-12 MED FILL — IBUPROFEN 800 MG TAB: 800 | 30 days supply | Qty: 90 | Fill #1

## 2018-02-12 NOTE — Therapy (Signed)
[place], [place], [address] Phone: [phone]   Fax:  [phone]  Physical Therapy Treatment  Patient Details  Name: Michelle Huerta MRN: [id number] Date of Birth: Oct 28, 1960 Referring Provider: Dr. Karlton Lemon    Encounter Date: 02/12/2018  PT End of Session - 02/12/18 1018    Visit Number  4    Number of Visits  12    Date for PT Re-Evaluation  02/21/18    PT Start Time  8182    PT Stop Time  1100    PT Time Calculation (min)  45 min    Activity Tolerance  Patient tolerated treatment well    Behavior During Therapy  [facility] for tasks assessed/performed       Past Medical History:  Diagnosis Date  . Allergy   . Asthma    no medicines  . Bilateral chronic knee pain   . Chronic back pain   . DDD (degenerative disc disease), cervical   . Depression    no meds  . Genital herpes    HSV type 2 at rectum, culture positive 3/15  . Glaucoma   . Heart murmur   . Hypertension   . Insomnia   . Lupus ([place])   . Mild sleep apnea    1/17 CPAP Dr Claiborne Billings  . Morbid obesity ([place]) 06/11/2015  . Osteoporosis   . Paget's disease   . Paget's disease of bone    Dr Trudie Reed, Reclast insufion 02/14/10 - bad reaction to infusion, now tolerates fosamax weekly  . Sleep apnea   . Thrombocytopenia ([place])   . Tubular adenoma polyp of rectum    8/12, Dr Fuller Plan, 5 yr follow up   . Vitamin D deficiency     Past Surgical History:  Procedure Laterality Date  . AUGMENTATION MAMMAPLASTY Bilateral   . BREAST ENHANCEMENT SURGERY    . BREAST REDUCTION SURGERY    . DILATION AND CURETTAGE OF UTERUS N/A 06/24/2015   Procedure: DILATATION AND CURETTAGE;  Surgeon: Emily Filbert, MD;  Location: [facility];  Service: Gynecology;  Laterality: N/A;  . REDUCTION MAMMAPLASTY Bilateral   . TOE SURGERY     removal of bone in each foot  . TUBAL LIGATION      There were no vitals filed for this visit.  Subjective Assessment - 02/12/18 1016     Subjective  I noticed my legs hurt the day after my last session.  Today they are both OK right now.      Currently in Pain?  No/denies         [facility] PT Assessment - 02/12/18 0001      AROM   Right Knee Flexion  120    Left Knee Flexion  120         [facility] Adult PT Treatment/Exercise - 02/12/18 0001      Knee/Hip Exercises: Stretches   Active Hamstring Stretch  Both;2 reps    Passive Hamstring Stretch  Both;2 reps supine and sitting     Gastroc Stretch  2 reps;Right;Left      Knee/Hip Exercises: Aerobic   Nustep  L1 UE and LE for 6 min       Knee/Hip Exercises: Standing   Heel Raises  Both;1 set;10 reps      Knee/Hip Exercises: Seated   Long Arc Quad  Right;Left;10 reps;Weights    Long Arc Quad Weight  3 lbs.    Hamstring Curl  Strengthening;Both;1 set;10 reps  Sit to Sand  10 reps;without UE support;Other (comment) difficult , cues for eccentric control , used [employer]       Knee/Hip Exercises: Supine   Quad Sets  Strengthening;Both;1 set;10 reps    Heel Slides  AAROM;Strengthening;Both;1 set;10 reps    Bridges  10 reps    Straight Leg Raises  10 reps;Right;Left    Patellar Mobs  gentle       Knee/Hip Exercises: Sidelying   Hip ABduction  Strengthening;Both;1 set;10 reps;2 sets    Clams  x 15       Manual Therapy   Manual Therapy  Passive ROM    Passive ROM  patellar mobs  , very brief, 2 min                  PT Long Term Goals - 02/12/18 1037      PT LONG TERM GOAL #1   Title  Pt will be I with HEP     Status  On-going      PT LONG TERM GOAL #2   Title  Pt will score 45% or better on FOTO to demo improved functional mobility.     Status  On-going      PT LONG TERM GOAL #3   Title  Pt will be able to work with pain 25% better with squatting, bending to clean.     Status  On-going      PT LONG TERM GOAL #4   Title  Pt will be able to ascend and descend stairs with 1 rail and good knee control, pain no more than minimal     Status  On-going       PT LONG TERM GOAL #5   Title  Pt will increase hip strength to 4+/5 or better for improved knee stability with gait mechanics.     Status  On-going            Plan - 02/12/18 1019    Clinical Impression Statement  Patient has done HEP sporadically.  Not seeing any functional improvment in knee, ability to work, walk.  However, her Rt knee ROM hads improved 15deg to 120 deg, same as the LLE.     PT Treatment/Interventions  ADLs/Self Care Home Management;Cryotherapy;Ultrasound;Moist Heat;Iontophoresis 4mg /ml Dexamethasone;Electrical Stimulation;DME Instruction;Gait training;Stair training;Balance training;Therapeutic exercise;Therapeutic activities;Functional mobility training;Neuromuscular re-education;Patient/family education;Manual techniques;Passive range of motion;Taping    PT Next Visit Plan  HEP review , bike vs Nustep , consider tape, try more closed chain .     PT Home Exercise Plan  calf and hamstring stretch, heel raises and hip abd , sit-stand , LAQ, SLR, heel slides , QS    Consulted and Agree with Plan of Care  Patient       Patient will benefit from skilled therapeutic intervention in order to improve the following deficits and impairments:  Decreased balance, Decreased endurance, Decreased mobility, Difficulty walking, Hypomobility, Pain, Postural dysfunction, Impaired flexibility, Increased fascial restricitons, Decreased strength, Obesity, Decreased range of motion, Abnormal gait  Visit Diagnosis: Stiffness of right knee, not elsewhere classified  Stiffness of left hip, not elsewhere classified  Stiffness of right hip, not elsewhere classified  Muscle weakness (generalized)  Difficulty in walking, not elsewhere classified     Problem List Patient Active Problem List   Diagnosis Date Noted  . Bilateral knee pain 12/28/2017  . Constipation 12/18/2017  . Cough 12/15/2017  . Lower resp. tract infection 12/15/2017  . Acute conjunctivitis of right eye  12/15/2017  .  H/O Paget's disease of bone 09/10/2017  . OSA on CPAP 10/28/2015  . Fibroids 08/07/2015  . Plantar fasciitis, bilateral 08/03/2015  . Metatarsal deformity 08/03/2015  . Equinus deformity of foot, acquired 08/03/2015  . Pronation deformity of both feet 08/03/2015  . Morbid obesity ([place]) 06/11/2015  . 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 Essential (primary) hypertension 06/11/2015  . 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 Benign essential HTN 09/19/2013  . Major depressive episode 09/19/2013    Laylanie Kruczek 02/12/2018, 11:21 AM  [facility] [address] [place], [place], [address] Phone: [phone]   Fax:  [phone]  Name: HEATH BADON MRN: [id number] Date of Birth: February 14, 1961  Raeford Razor, PT 02/12/18 11:22 AM Phone: [phone] Fax: [phone]

## 2018-02-12 NOTE — Telephone Encounter (Signed)
Copied from [place] [id number]. Topic: Inquiry >> Jan 30, 2018  1:04 PM Conception Chancy, NT wrote: Reason for CRM: patient is calling and states that her FMLA paperwork is not filled out correctly and that she needs this fixed.  >> Jan 31, 2018  2:09 PM Cherrie Distance wrote: FMLA forms were not completed for this patient--there are messages in her chart along with a mychart message that was sent to her about them. Providers at this office did not take her out of work for anything so we could not complete the forms. If she needs new forms completed she will need to come in for an OV

## 2018-02-14 ENCOUNTER — Other Ambulatory Visit: Payer: Self-pay

## 2018-02-14 ENCOUNTER — Encounter: Payer: Self-pay | Admitting: Physician Assistant

## 2018-02-14 ENCOUNTER — Ambulatory Visit: Payer: 59 | Admitting: Physician Assistant

## 2018-02-14 VITALS — BP 144/87 | HR 78 | Temp 98.6°F | Resp 16 | Ht 66.5 in | Wt 301.2 lb

## 2018-02-14 DIAGNOSIS — M25561 Pain in right knee: Secondary | ICD-10-CM | POA: Diagnosis not present

## 2018-02-14 DIAGNOSIS — R21 Rash and other nonspecific skin eruption: Secondary | ICD-10-CM | POA: Diagnosis not present

## 2018-02-14 LAB — POCT SKIN KOH: Skin KOH, POC: NEGATIVE

## 2018-02-14 MED ORDER — DICLOFENAC SODIUM 1 % TD GEL: 4.0000 g | Freq: Four times a day (QID) | TRANSDERMAL | 1 refills | Status: DC

## 2018-02-14 MED ORDER — TRIAMCINOLONE ACETONIDE 0.5 % EX OINT: 1.0000 "application " | TOPICAL_OINTMENT | Freq: Two times a day (BID) | CUTANEOUS | 0 refills | Status: DC

## 2018-02-14 NOTE — Progress Notes (Signed)
Michelle Huerta  MRN: [id number] DOB: [birth year]  PCP: Dorise Hiss, PA-C  Subjective:  Pt is a 57 year old female who presents to clinic for rash on her chest and right foot x 2 weeks.   Endorses rashes Rash of her breast x 5 days-complains of itchiness.  She believes this is due to where she clips her name badge on her shirt. Rash on the top of her right foot x 1 to 2 weeks.  Endorses itching.  She has not taken anything to feel better  She states her FMLA form was incomplete and she was denied.  She plans to call her HR director to inquire more about this.  Continues to complain of right knee pain.  She is currently in physical therapy.  Review of Systems  Constitutional: Negative for chills and fever.  Gastrointestinal: Negative for abdominal pain, nausea and vomiting.  Musculoskeletal: Positive for arthralgias (Right knee) and gait problem. Negative for back pain.  Skin: Positive for rash.  Neurological: Negative for dizziness and headaches.    Patient Active Problem List   Diagnosis Date Noted  . Bilateral knee pain 12/28/2017  . Constipation 12/18/2017  . Cough 12/15/2017  . Lower resp. tract infection 12/15/2017  . Acute conjunctivitis of right eye 12/15/2017  . H/O Paget's disease of bone 09/10/2017  . OSA on CPAP 10/28/2015  . Fibroids 08/07/2015  . Plantar fasciitis, bilateral 08/03/2015  . Metatarsal deformity 08/03/2015  . Equinus deformity of foot, acquired 08/03/2015  . Pronation deformity of both feet 08/03/2015  . Morbid obesity ([place]) 06/11/2015  . 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 Essential (primary) hypertension 06/11/2015  . 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 Benign essential HTN 09/19/2013  . Major depressive episode 09/19/2013    Current Outpatient Medications on File Prior to Visit  Medication Sig Dispense Refill  . amLODipine (NORVASC) 5 MG tablet Take 1 tablet (5 mg total) by mouth daily. 90 tablet 1  . Estradiol 10 MCG  TABS vaginal tablet 1 pill per vagina every Mon, Wed, Friday 30 tablet 12  . fluticasone (FLONASE) 50 MCG/ACT nasal spray Place into both nostrils daily.    . fluticasone (FLONASE) 50 MCG/ACT nasal spray Place 2 sprays into both nostrils daily. 16 g 6  . meloxicam (MOBIC) 7.5 MG tablet Take 1-2 tablets (7.5-15 mg total) by mouth daily. 60 tablet 1  . telmisartan (MICARDIS) 80 MG tablet Take 1 tablet (80 mg total) by mouth daily. 90 tablet 1  . valACYclovir (VALTREX) 500 MG tablet Take 500 mg by mouth daily.    . Hydroxychloroquine Sulfate (PLAQUENIL PO) Take by mouth.    . polyethylene glycol powder (GLYCOLAX/MIRALAX) powder Take 17 g by mouth 2 (two) times daily as needed. (Patient not taking: Reported on 02/14/2018) 578 g 1   No current facility-administered medications on file prior to visit.     Allergies  Allergen Reactions  . Penicillins Rash  . Reclast [Zoledronic Acid] Nausea And Vomiting     Objective:  BP (!) 144/87 (BP Location: Right Arm, Patient Position: Sitting, Cuff Size: Normal)   Pulse 78   Temp 98.6 F (37 C) (Oral)   Resp 16   Ht 5' 6.5" (1.689 m)   Wt (!) 301 lb 3.2 oz (136.6 kg)   LMP 08/01/2012   SpO2 98%   BMI 47.89 kg/m   Physical Exam  Constitutional: She is oriented to person, place, and time. No distress.  Neurological: She is alert and  oriented to person, place, and time.  Skin: Skin is warm and dry. Rash noted. Rash is maculopapular (Right dorsal lateral foot) and urticarial (Right mid superior breast).  Psychiatric: Judgment normal.  Vitals reviewed.   Results for orders placed or performed in visit on 02/14/18  POCT Skin KOH  Result Value Ref Range   Skin KOH, POC Negative Negative    Assessment and Plan :  1. Rash and nonspecific skin eruption -KOH was negative.  Suspect contact dermatitis on her breast.  Plan to treat with Kenalog.  Return to clinic if no improvement. - POCT Skin KOH - triamcinolone ointment (KENALOG) 0.5 %; Apply 1  application topically 2 (two) times daily.  Dispense: 30 g; Refill: 0  2. Right knee pain, unspecified chronicity -This is a chronic problem for this patient.  She is currently seeing physical therapy.  She will bring back FMLA paperwork which was incomplete.. - diclofenac sodium (VOLTAREN) 1 % GEL; Apply 4 g topically 4 (four) times daily. For knee pain  Dispense: 100 g; Refill: 1    Whitney Roselani Grajeda, PA-C  [facility] 02/14/2018 5:59 PM  Please note: Portions of this report may have been transcribed using dragon voice recognition software. Every effort was made to ensure accuracy; however, inadvertent computerized transcription errors may be present.

## 2018-02-14 NOTE — Patient Instructions (Addendum)
  Schedule an annual exam next month to screen for diabetes to see if you need to start metformin.   Let me know what's wrong with the FMLA paperwork and we will complete it.   Apply Kenalog cream to affected area 1-2 x/day for 1-2 weeks.   Thank you for coming in today. I hope you feel we met your needs.  Feel free to call PCP if you have any questions or further requests.  Please consider signing up for MyChart if you do not already have it, as this is a great way to communicate with me.  Best,  Whitney McVey, PA-C   IF you received an x-ray today, you will receive an invoice from [facility]. Please contact [facility] at [phone] with questions or concerns regarding your invoice.   IF you received labwork today, you will receive an invoice from [facility]. Please contact [facility] at [phone] with questions or concerns regarding your invoice.   Our billing staff will not be able to assist you with questions regarding bills from these companies.  You will be contacted with the lab results as soon as they are available. The fastest way to get your results is to activate your My Chart account. Instructions are located on the last page of this paperwork. If you have not heard from [place] regarding the results in 2 weeks, please contact this office.

## 2018-02-15 MED FILL — TRIAMCINOLONE 0.5% OINTMENT: 0.5 | 30 days supply | Qty: 30 | Fill #0

## 2018-02-15 MED FILL — DICLOFENAC SODIUM 1% GEL: 1 | 7 days supply | Qty: 100 | Fill #0

## 2018-02-21 ENCOUNTER — Telehealth: Payer: Self-pay | Admitting: Physician Assistant

## 2018-02-21 ENCOUNTER — Ambulatory Visit: Payer: 59 | Admitting: Physical Therapy

## 2018-02-21 DIAGNOSIS — M6281 Muscle weakness (generalized): Secondary | ICD-10-CM | POA: Diagnosis not present

## 2018-02-21 DIAGNOSIS — M25651 Stiffness of right hip, not elsewhere classified: Secondary | ICD-10-CM

## 2018-02-21 DIAGNOSIS — M25652 Stiffness of left hip, not elsewhere classified: Secondary | ICD-10-CM

## 2018-02-21 DIAGNOSIS — M25661 Stiffness of right knee, not elsewhere classified: Secondary | ICD-10-CM | POA: Diagnosis not present

## 2018-02-21 DIAGNOSIS — R262 Difficulty in walking, not elsewhere classified: Secondary | ICD-10-CM

## 2018-02-21 NOTE — Therapy (Signed)
[place], [place], [address] Phone: [phone]   Fax:  [phone]  Physical Therapy Treatment  Patient Details  Name: Michelle Huerta MRN: [id number] Date of Birth: [birth year] Referring Provider: Dr. Karlton Lemon    Encounter Date: 02/21/2018  PT End of Session - 02/21/18 0932    Visit Number  5    Number of Visits  12    Date for PT Re-Evaluation  04/04/18    PT Start Time  0931    PT Stop Time  1012    PT Time Calculation (min)  41 min    Activity Tolerance  Patient tolerated treatment well    Behavior During Therapy  [facility] for tasks assessed/performed       Past Medical History:  Diagnosis Date  . Allergy   . Asthma    no medicines  . Bilateral chronic knee pain   . Chronic back pain   . DDD (degenerative disc disease), cervical   . Depression    no meds  . Genital herpes    HSV type 2 at rectum, culture positive 3/15  . Glaucoma   . Heart murmur   . Hypertension   . Insomnia   . Lupus ([place])   . Mild sleep apnea    1/17 CPAP Dr Claiborne Billings  . Morbid obesity ([place]) 06/11/2015  . Osteoporosis   . Paget's disease   . Paget's disease of bone    Dr Trudie Reed, Reclast insufion 02/14/10 - bad reaction to infusion, now tolerates fosamax weekly  . Sleep apnea   . Thrombocytopenia ([place])   . Tubular adenoma polyp of rectum    8/12, Dr Fuller Plan, 5 yr follow up   . Vitamin D deficiency     Past Surgical History:  Procedure Laterality Date  . AUGMENTATION MAMMAPLASTY Bilateral   . BREAST ENHANCEMENT SURGERY    . BREAST REDUCTION SURGERY    . DILATION AND CURETTAGE OF UTERUS N/A 06/24/2015   Procedure: DILATATION AND CURETTAGE;  Surgeon: Emily Filbert, MD;  Location: [facility];  Service: Gynecology;  Laterality: N/A;  . REDUCTION MAMMAPLASTY Bilateral   . TOE SURGERY     removal of bone in each foot  . TUBAL LIGATION      There were no vitals filed for this visit.  Subjective Assessment - 02/21/18 0936     Subjective  I havent been doing my exercises.  Rt knee stiff.  I need to keep coming.     Currently in Pain?  No/denies    Pain Score  0-No pain    Pain Location  Knee    Pain Orientation  Right;Left    Pain Descriptors / Indicators  Tightness    Pain Type  Chronic pain    Pain Onset  More than a month ago    Pain Frequency  Intermittent    Aggravating Factors   squatting, walking    Pain Relieving Factors  rest, sitting          [facility] PT Assessment - 02/21/18 0001      Observation/Other Assessments   Focus on Therapeutic Outcomes (FOTO)   44%      Posture/Postural Control   Posture/Postural Control  Postural limitations    Posture Comments  wide BOS, genu varus      AROM   Right Knee Flexion  120    Left Knee Flexion  120      Strength   Right Hip Flexion  4+/5    Right Hip ABduction  4-/5    Left Hip Flexion  4+/5    Left Hip ABduction  4-/5    Right Knee Flexion  5/5    Right Knee Extension  5/5    Left Knee Flexion  5/5    Left Knee Extension  5/5      Transfers   Five time sit to stand comments   24 sec needed UE support                    [facility] Adult PT Treatment/Exercise - 02/21/18 0001      Lumbar Exercises: Stretches   Single Knee to Chest Stretch  2 reps;30 seconds    Lower Trunk Rotation  10 seconds    Lower Trunk Rotation Limitations  x10     Active Hamstring Stretch  Both;2 reps      Knee/Hip Exercises: Aerobic   Stationary Bike  5 min L2       Knee/Hip Exercises: Seated   Long Arc Quad  Strengthening;Both;1 set;15 reps;Weights    Long Arc Quad Weight  4 lbs.    Sit to Sand  10 reps;without UE support;Other (comment)   difficult , cues for eccentric control , used Airex      Knee/Hip Exercises: Supine   Bridges  10 reps    Straight Leg Raises  Strengthening;Both;1 set;20 reps      Knee/Hip Exercises: Sidelying   Hip ABduction  Strengthening;Both;1 set;10 reps    Clams  x 15              PT Education - 02/21/18 1015     Education Details  need to be consistent with HEP     Person(s) Educated  Patient    Methods  Explanation    Comprehension  Verbalized understanding          PT Long Term Goals - 02/21/18 1048      PT LONG TERM GOAL #1   Title  Pt will be I with HEP     Status  On-going      PT LONG TERM GOAL #2   Title  Pt will score 45% or better on FOTO to demo improved functional mobility.     Baseline  met today 44%     Status  Achieved      PT LONG TERM GOAL #3   Title  Pt will be able to work with pain 25% better with squatting, bending to clean.     Status  On-going      PT LONG TERM GOAL #4   Title  Pt will be able to ascend and descend stairs with 1 rail and good knee control, pain no more than minimal     Status  On-going      PT LONG TERM GOAL #5   Title  Pt will increase hip strength to 4+/5 or better for improved knee stability with gait mechanics.     Status  On-going            Plan - 02/21/18 1049    Clinical Impression Statement  Patient renewed today for more time to achieve the expected visits.  She has seen a min improvement in abilty to squat, bend and walk but not 25% .  AROM improved. to 120 deg.  Reinforcing the need to do HEP to see lasting results.     PT Treatment/Interventions  ADLs/Self Care Home Management;Cryotherapy;Ultrasound;Moist Heat;Iontophoresis 56m/ml Dexamethasone;Electrical  Stimulation;DME Instruction;Gait training;Stair training;Balance training;Therapeutic exercise;Therapeutic activities;Functional mobility training;Neuromuscular re-education;Patient/family education;Manual techniques;Passive range of motion;Taping    PT Next Visit Plan  HEP review , bike vs Nustep , consider tape, try more closed chain .     PT Home Exercise Plan  calf and hamstring stretch, heel raises and hip abd , sit-stand , LAQ, SLR, heel slides , QS    Consulted and Agree with Plan of Care  Patient       Patient will benefit from skilled therapeutic intervention in  order to improve the following deficits and impairments:  Decreased balance, Decreased endurance, Decreased mobility, Difficulty walking, Hypomobility, Pain, Postural dysfunction, Impaired flexibility, Increased fascial restricitons, Decreased strength, Obesity, Decreased range of motion, Abnormal gait  Visit Diagnosis: Stiffness of right knee, not elsewhere classified  Stiffness of left hip, not elsewhere classified  Stiffness of right hip, not elsewhere classified  Muscle weakness (generalized)  Difficulty in walking, not elsewhere classified     Problem List Patient Active Problem List   Diagnosis Date Noted  . Bilateral knee pain 12/28/2017  . Constipation 12/18/2017  . Cough 12/15/2017  . Lower resp. tract infection 12/15/2017  . Acute conjunctivitis of right Huerta 12/15/2017  . H/O Paget's disease of bone 09/10/2017  . OSA on CPAP 10/28/2015  . Fibroids 08/07/2015  . Plantar fasciitis, bilateral 08/03/2015  . Metatarsal deformity 08/03/2015  . Equinus deformity of foot, acquired 08/03/2015  . Pronation deformity of both feet 08/03/2015  . Morbid obesity ([place]) 06/11/2015  . 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 Essential (primary) hypertension 06/11/2015  . 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 Benign essential HTN 09/19/2013  . Major depressive episode 09/19/2013    PAA,JENNIFER 02/21/2018, 10:54 AM  [facility] [address] [place], [place], [address] Phone: [phone]   Fax:  [phone]  Name: Michelle Huerta MRN: [id number] Date of Birth: [birth year]   Raeford Razor, PT 02/21/18 10:55 AM Phone: [phone] Fax: [phone]

## 2018-02-21 NOTE — Telephone Encounter (Signed)
Patient needs FMLA forms for the pain in both her knees, I was not sure how to complete these forms because it looks as though we have not taken the patient out of work any for this issue and she is also seeing an orthopedic doctor for this.  I would check with the patients ortho before completing because  they are the main people that are treating her for this issue. I will place the forms in [employer] box on 02/21/18 please return to the FMLA/Disability box at the 102 checkout desk within 5-7 business days. Thank you!

## 2018-02-26 ENCOUNTER — Encounter: Payer: Self-pay | Admitting: Physician Assistant

## 2018-02-27 NOTE — Telephone Encounter (Signed)
Forms scanned and faxed on 02/27/18

## 2018-03-06 ENCOUNTER — Encounter: Payer: Self-pay | Admitting: Physical Therapy

## 2018-03-06 ENCOUNTER — Ambulatory Visit: Payer: 59 | Admitting: Physical Therapy

## 2018-03-06 DIAGNOSIS — M25661 Stiffness of right knee, not elsewhere classified: Secondary | ICD-10-CM | POA: Diagnosis not present

## 2018-03-06 DIAGNOSIS — M25652 Stiffness of left hip, not elsewhere classified: Secondary | ICD-10-CM | POA: Diagnosis not present

## 2018-03-06 DIAGNOSIS — M6281 Muscle weakness (generalized): Secondary | ICD-10-CM

## 2018-03-06 DIAGNOSIS — M25651 Stiffness of right hip, not elsewhere classified: Secondary | ICD-10-CM | POA: Diagnosis not present

## 2018-03-06 DIAGNOSIS — R262 Difficulty in walking, not elsewhere classified: Secondary | ICD-10-CM

## 2018-03-06 MED FILL — ESTRADIOL 10 MCG TABS: 10 | 56 days supply | Qty: 24 | Fill #4

## 2018-03-06 NOTE — Therapy (Signed)
[place], [place], [address] Phone: [phone]   Fax:  [phone]  Physical Therapy Treatment  Patient Details  Name: Michelle Huerta MRN: [id number] Date of Birth: [birth year] Referring Provider: Dr. Karlton Lemon    Encounter Date: 03/06/2018  PT End of Session - 03/06/18 0852    Visit Number  6    Number of Visits  12    Date for PT Re-Evaluation  04/04/18    PT Start Time  2353    PT Stop Time  0928    PT Time Calculation (min)  41 min       Past Medical History:  Diagnosis Date  . Allergy   . Asthma    no medicines  . Bilateral chronic knee pain   . Chronic back pain   . DDD (degenerative disc disease), cervical   . Depression    no meds  . Genital herpes    HSV type 2 at rectum, culture positive 3/15  . Glaucoma   . Heart murmur   . Hypertension   . Insomnia   . Lupus ([place])   . Mild sleep apnea    1/17 CPAP Dr Claiborne Billings  . Morbid obesity ([place]) 06/11/2015  . Osteoporosis   . Paget's disease   . Paget's disease of bone    Dr Trudie Reed, Reclast insufion 02/14/10 - bad reaction to infusion, now tolerates fosamax weekly  . Sleep apnea   . Thrombocytopenia ([place])   . Tubular adenoma polyp of rectum    8/12, Dr Fuller Plan, 5 yr follow up   . Vitamin D deficiency     Past Surgical History:  Procedure Laterality Date  . AUGMENTATION MAMMAPLASTY Bilateral   . BREAST ENHANCEMENT SURGERY    . BREAST REDUCTION SURGERY    . DILATION AND CURETTAGE OF UTERUS N/A 06/24/2015   Procedure: DILATATION AND CURETTAGE;  Surgeon: Emily Filbert, MD;  Location: [facility];  Service: Gynecology;  Laterality: N/A;  . REDUCTION MAMMAPLASTY Bilateral   . TOE SURGERY     removal of bone in each foot  . TUBAL LIGATION      There were no vitals filed for this visit.  Subjective Assessment - 03/06/18 0857    Subjective  Only did my exercise once. I know I need to do more.     Currently in Pain?  Yes    Pain Score  3     Pain Location  Knee    Pain Orientation  Right    Pain Descriptors / Indicators  Sore    Aggravating Factors   standing after sitting, prolonged sitting    Pain Relieving Factors  rest , change positions                        [facility] Adult PT Treatment/Exercise - 03/06/18 0001      Knee/Hip Exercises: Stretches   Active Hamstring Stretch  Both;2 reps      Knee/Hip Exercises: Aerobic   Nustep  L5 x 5 minutes       Knee/Hip Exercises: Machines for Strengthening   Cybex Knee Extension  5# x 10 bilateral     Cybex Knee Flexion  25# x 10 bilateral    Total Gym Leg Press  2 plates x 20       Knee/Hip Exercises: Supine   Short Arc Quad Sets  20 reps;Both   3#    Bridges  10 reps  Straight Leg Raises  Strengthening;Both;1 set;20 reps      Knee/Hip Exercises: Sidelying   Hip ABduction  Strengthening;Both;10 reps;2 sets    Clams  x 15                   PT Long Term Goals - 02/21/18 1048      PT LONG TERM GOAL #1   Title  Pt will be I with HEP     Status  On-going      PT LONG TERM GOAL #2   Title  Pt will score 45% or better on FOTO to demo improved functional mobility.     Baseline  met today 44%     Status  Achieved      PT LONG TERM GOAL #3   Title  Pt will be able to work with pain 25% better with squatting, bending to clean.     Status  On-going      PT LONG TERM GOAL #4   Title  Pt will be able to ascend and descend stairs with 1 rail and good knee control, pain no more than minimal     Status  On-going      PT LONG TERM GOAL #5   Title  Pt will increase hip strength to 4+/5 or better for improved knee stability with gait mechanics.     Status  On-going            Plan - 03/06/18 3748    Clinical Impression Statement  Pt arrives after 13 day absence from PT. She reports doing hip abduction exercise 1 x since last visit. She is aware she should be more consistent with HEP. Introduced pt to gym machines for LE strengthening. Pt  reports she will be sore tomorrow. She reports some pulling in right knee with knee flexion and extension. She is scheduled to retrun tomorrow.     PT Next Visit Plan  HEP review , bike vs Nustep , consider tape, try more closed chain, continue machines if she did okay, reinforce HEP and add closed chain to HEP    PT Home Exercise Plan  calf and hamstring stretch, heel raises and hip abd , sit-stand , LAQ, SLR, heel slides , QS    Consulted and Agree with Plan of Care  Patient       Patient will benefit from skilled therapeutic intervention in order to improve the following deficits and impairments:  Decreased balance, Decreased endurance, Decreased mobility, Difficulty walking, Hypomobility, Pain, Postural dysfunction, Impaired flexibility, Increased fascial restricitons, Decreased strength, Obesity, Decreased range of motion, Abnormal gait  Visit Diagnosis: Stiffness of right knee, not elsewhere classified  Stiffness of left hip, not elsewhere classified  Stiffness of right hip, not elsewhere classified  Muscle weakness (generalized)  Difficulty in walking, not elsewhere classified     Problem List Patient Active Problem List   Diagnosis Date Noted  . Bilateral knee pain 12/28/2017  . Constipation 12/18/2017  . Cough 12/15/2017  . Lower resp. tract infection 12/15/2017  . Acute conjunctivitis of right eye 12/15/2017  . H/O Paget's disease of bone 09/10/2017  . OSA on CPAP 10/28/2015  . Fibroids 08/07/2015  . Plantar fasciitis, bilateral 08/03/2015  . Metatarsal deformity 08/03/2015  . Equinus deformity of foot, acquired 08/03/2015  . Pronation deformity of both feet 08/03/2015  . Morbid obesity ([place]) 06/11/2015  . 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 Essential (primary) hypertension 06/11/2015  . 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  Benign essential HTN 09/19/2013  . Major depressive episode 09/19/2013    Dorene Ar, PTA 03/06/2018, 9:26 AM  [facility] [address] [place], [place], [address] Phone: [phone]   Fax:  [phone]  Name: Michelle Huerta MRN: [id number] Date of Birth: 03-11-1961

## 2018-03-07 ENCOUNTER — Encounter: Payer: Self-pay | Admitting: Physical Therapy

## 2018-03-07 ENCOUNTER — Ambulatory Visit: Payer: 59 | Admitting: Physical Therapy

## 2018-03-07 DIAGNOSIS — R262 Difficulty in walking, not elsewhere classified: Secondary | ICD-10-CM

## 2018-03-07 DIAGNOSIS — M6281 Muscle weakness (generalized): Secondary | ICD-10-CM | POA: Diagnosis not present

## 2018-03-07 DIAGNOSIS — M25651 Stiffness of right hip, not elsewhere classified: Secondary | ICD-10-CM

## 2018-03-07 DIAGNOSIS — M25661 Stiffness of right knee, not elsewhere classified: Secondary | ICD-10-CM

## 2018-03-07 DIAGNOSIS — M25652 Stiffness of left hip, not elsewhere classified: Secondary | ICD-10-CM

## 2018-03-07 NOTE — Patient Instructions (Signed)
Knee Extension: Resisted (Sitting)   With band looped around right ankle and under other foot, straighten leg with ankle loop. Keep other leg bent to increase resistance. Repeat __20__ times per set. Do _1___ sets per session. Do __2_ sessions per day.  http://orth.exer.us/690   CKnee Flexion: Resisted (Sitting)  Place KNOT in DOOR or  Sit with band under left foot and looped around ankle of supported leg. Pull unsupported leg back. Repeat __20__ times per set. Do __1__ sets per session. Do _2___ sessions per day.  http://orth.exer.us/695   Copyright  VHI. All rights reserved.

## 2018-03-07 NOTE — Therapy (Addendum)
[place], [place], [address] Phone: [phone]   Fax:  [phone]  Physical Therapy Treatment/Discharge  Patient Details  Name: Michelle Huerta MRN: [id number] Date of Birth: 1960-08-12 Referring Provider: Dr. Karlton Lemon    Encounter Date: 03/07/2018  PT End of Session - 03/07/18 0808    Visit Number  7    Number of Visits  12    Date for PT Re-Evaluation  04/04/18    PT Start Time  0803    PT Stop Time  0845    PT Time Calculation (min)  42 min       Past Medical History:  Diagnosis Date  . Allergy   . Asthma    no medicines  . Bilateral chronic knee pain   . Chronic back pain   . DDD (degenerative disc disease), cervical   . Depression    no meds  . Genital herpes    HSV type 2 at rectum, culture positive 3/15  . Glaucoma   . Heart murmur   . Hypertension   . Insomnia   . Lupus ([place])   . Mild sleep apnea    1/17 CPAP Dr Claiborne Billings  . Morbid obesity ([place]) 06/11/2015  . Osteoporosis   . Paget's disease   . Paget's disease of bone    Dr Trudie Reed, Reclast insufion 02/14/10 - bad reaction to infusion, now tolerates fosamax weekly  . Sleep apnea   . Thrombocytopenia ([place])   . Tubular adenoma polyp of rectum    8/12, Dr Fuller Plan, 5 yr follow up   . Vitamin D deficiency     Past Surgical History:  Procedure Laterality Date  . AUGMENTATION MAMMAPLASTY Bilateral   . BREAST ENHANCEMENT SURGERY    . BREAST REDUCTION SURGERY    . DILATION AND CURETTAGE OF UTERUS N/A 06/24/2015   Procedure: DILATATION AND CURETTAGE;  Surgeon: Emily Filbert, MD;  Location: [facility];  Service: Gynecology;  Laterality: N/A;  . REDUCTION MAMMAPLASTY Bilateral   . TOE SURGERY     removal of bone in each foot  . TUBAL LIGATION      There were no vitals filed for this visit.  Subjective Assessment - 03/07/18 0807    Subjective  Normal morning pains . Nothing unusual. I put my ace bandage back on.     Currently in Pain?   Yes    Pain Score  3     Pain Location  Knee    Pain Orientation  Right;Anterior                       [facility] Adult PT Treatment/Exercise - 03/07/18 0001      Knee/Hip Exercises: Stretches   Active Hamstring Stretch  Right;3 reps;30 seconds    Gastroc Stretch  2 reps;Right;Left;30 seconds      Knee/Hip Exercises: Aerobic   Stationary Bike  5 min L2       Knee/Hip Exercises: Machines for Strengthening   Cybex Knee Extension  5# x 10 bilateral     Cybex Knee Flexion  25# x 10 bilateral    Total Gym Leg Press  2 plates x 20       Knee/Hip Exercises: Seated   Long Arc Quad  Strengthening;Both;1 set;15 reps;Weights   red   Hamstring Curl  20 reps   red band   Sit to [employer]  10 reps;without UE support;Other (comment)   difficult , cues for  eccentric control , used Airex      Knee/Hip Exercises: Supine   Quad Sets  20 reps    Straight Leg Raises  Strengthening;Both;1 set;20 reps      Knee/Hip Exercises: Sidelying   Hip ABduction  20 reps             PT Education - 03/07/18 0836    Education Details  HEP update and reprint    Person(s) Educated  Patient    Methods  Explanation;Handout    Comprehension  Verbalized understanding          PT Long Term Goals - 03/07/18 0839      PT LONG TERM GOAL #1   Title  Pt will be I with HEP     Time  6    Period  Weeks    Status  On-going      PT LONG TERM GOAL #2   Title  Pt will score 45% or better on FOTO to demo improved functional mobility.     Baseline  met today 44%     Time  6    Period  Weeks    Status  Achieved      PT LONG TERM GOAL #3   Title  Pt will be able to work with pain 25% better with squatting, bending to clean.     Baseline  not yet 25%    Time  6    Period  Weeks    Status  On-going      PT LONG TERM GOAL #4   Title  Pt will be able to ascend and descend stairs with 1 rail and good knee control, pain no more than minimal     Baseline  still pain and difficulty with stairs     Time  6    Period  Weeks    Status  On-going      PT LONG TERM GOAL #5   Title  Pt will increase hip strength to 4+/5 or better for improved knee stability with gait mechanics.     Time  6    Period  Weeks    Status  On-going            Plan - 03/07/18 3086    Clinical Impression Statement  No increased pain after yesterday's visit. Repeated gym machines and reviewed HEP. Added theraband for seated knee strength. Reviewed Sit-stand, She needs seat eleavted to complete 10 reps without UE. Encouraged compliance with HEP over next 2 weeks as she cannot attend due to holiday and work schedule.     PT Next Visit Plan  HEP review ,Try step ups next ,  bike vs Nustep , consider tape, try more closed chain, continue machines if she did okay, reinforce HEP and add closed chain to HEP    PT Home Exercise Plan  calf and hamstring stretch, heel raises and hip abd , sit-stand , LAQ, SLR, heel slides , QS, red band for LAQ and heel slides     Consulted and Agree with Plan of Care  Patient       Patient will benefit from skilled therapeutic intervention in order to improve the following deficits and impairments:  Decreased balance, Decreased endurance, Decreased mobility, Difficulty walking, Hypomobility, Pain, Postural dysfunction, Impaired flexibility, Increased fascial restricitons, Decreased strength, Obesity, Decreased range of motion, Abnormal gait  Visit Diagnosis: Stiffness of right knee, not elsewhere classified  Stiffness of left hip, not elsewhere classified  Stiffness of  right hip, not elsewhere classified  Muscle weakness (generalized)  Difficulty in walking, not elsewhere classified     Problem List Patient Active Problem List   Diagnosis Date Noted  . Bilateral knee pain 12/28/2017  . Constipation 12/18/2017  . Cough 12/15/2017  . Lower resp. tract infection 12/15/2017  . Acute conjunctivitis of right eye 12/15/2017  . H/O Paget's disease of bone 09/10/2017  . OSA on  CPAP 10/28/2015  . Fibroids 08/07/2015  . Plantar fasciitis, bilateral 08/03/2015  . Metatarsal deformity 08/03/2015  . Equinus deformity of foot, acquired 08/03/2015  . Pronation deformity of both feet 08/03/2015  . Morbid obesity ([place]) 06/11/2015  . 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 Essential (primary) hypertension 06/11/2015  . 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 Benign essential HTN 09/19/2013  . Major depressive episode 09/19/2013    Dorene Ar, PTA 03/07/2018, 9:19 AM  [facility] [address] [place], [place], [address] Phone: [phone]   Fax:  [phone]  Name: Michelle Huerta MRN: [id number] Date of Birth: 04-01-1961  PHYSICAL THERAPY DISCHARGE SUMMARY  Visits from Start of Care: 7  Current functional level related to goals / functional outcomes: See above for most recent info    Remaining deficits: See above: pain, weakness, ROM    Education / Equipment: HEP, RICE  Plan: Patient agrees to discharge.  Patient goals were not met. Patient is being discharged due to not returning since the last visit.  ?????    Raeford Razor, PT 04/09/18 11:20 AM Phone: [phone] Fax: [phone]

## 2018-03-08 ENCOUNTER — Encounter: Payer: Self-pay | Admitting: Radiology

## 2018-03-09 ENCOUNTER — Ambulatory Visit (INDEPENDENT_AMBULATORY_CARE_PROVIDER_SITE_OTHER): Payer: 59 | Admitting: Physician Assistant

## 2018-03-09 ENCOUNTER — Ambulatory Visit (INDEPENDENT_AMBULATORY_CARE_PROVIDER_SITE_OTHER): Payer: 59

## 2018-03-09 ENCOUNTER — Encounter: Payer: Self-pay | Admitting: Physician Assistant

## 2018-03-09 ENCOUNTER — Other Ambulatory Visit: Payer: Self-pay

## 2018-03-09 VITALS — BP 140/86 | HR 85 | Temp 98.2°F | Resp 16 | Ht 68.0 in | Wt 300.6 lb

## 2018-03-09 DIAGNOSIS — M25561 Pain in right knee: Secondary | ICD-10-CM | POA: Diagnosis not present

## 2018-03-09 DIAGNOSIS — R350 Frequency of micturition: Secondary | ICD-10-CM | POA: Diagnosis not present

## 2018-03-09 DIAGNOSIS — R7303 Prediabetes: Secondary | ICD-10-CM | POA: Insufficient documentation

## 2018-03-09 DIAGNOSIS — G8929 Other chronic pain: Secondary | ICD-10-CM

## 2018-03-09 DIAGNOSIS — M1711 Unilateral primary osteoarthritis, right knee: Secondary | ICD-10-CM | POA: Diagnosis not present

## 2018-03-09 LAB — POCT URINALYSIS DIP (MANUAL ENTRY)
Bilirubin, UA: NEGATIVE
Blood, UA: NEGATIVE
Glucose, UA: NEGATIVE mg/dL
Ketones, POC UA: NEGATIVE mg/dL
Leukocytes, UA: NEGATIVE
Nitrite, UA: NEGATIVE
Protein Ur, POC: NEGATIVE mg/dL
Spec Grav, UA: 1.015 (ref 1.010–1.025)
Urobilinogen, UA: 1 E.U./dL
pH, UA: 5.5 (ref 5.0–8.0)

## 2018-03-09 LAB — POC MICROSCOPIC URINALYSIS (UMFC): Mucus: ABSENT

## 2018-03-09 LAB — POCT GLYCOSYLATED HEMOGLOBIN (HGB A1C): Hemoglobin A1C: 6 % — AB (ref 4.0–5.6)

## 2018-03-09 LAB — GLUCOSE, POCT (MANUAL RESULT ENTRY): POC Glucose: 72 mg/dL (ref 70–99)

## 2018-03-09 NOTE — Patient Instructions (Addendum)
You do not have a UTI.  You have prediabetes. You can correct this with weight loss (better eating and exercise).   Prediabetes Eating Plan Prediabetes-also called impaired glucose tolerance or impaired fasting glucose-is a condition that causes blood sugar (blood glucose) levels to be higher than normal. Following a healthy diet can help to keep prediabetes under control. It can also help to lower the risk of type 2 diabetes and heart disease, which are increased in people who have prediabetes. Along with regular exercise, a healthy diet:  Promotes weight loss.  Helps to control blood sugar levels.  Helps to improve the way that the body uses insulin.  What do I need to know about this eating plan?  Use the glycemic index (GI) to plan your meals. The index tells you how quickly a food will raise your blood sugar. Choose low-GI foods. These foods take a longer time to raise blood sugar.  Pay close attention to the amount of carbohydrates in the food that you eat. Carbohydrates increase blood sugar levels.  Keep track of how many calories you take in. Eating the right amount of calories will help you to achieve a healthy weight. Losing about 7 percent of your starting weight can help to prevent type 2 diabetes.  You may want to follow a Mediterranean diet. This diet includes a lot of vegetables, lean meats or fish, whole grains, fruits, and healthy oils and fats. What foods can I eat? Grains Whole grains, such as whole-wheat or whole-grain breads, crackers, cereals, and pasta. Unsweetened oatmeal. Bulgur. Barley. Quinoa. Creer rice. Corn or whole-wheat flour tortillas or taco shells. Vegetables Lettuce. Spinach. Peas. Beets. Cauliflower. Cabbage. Broccoli. Carrots. Tomatoes. Squash. Eggplant. Herbs. Peppers. Onions. Cucumbers. Brussels sprouts. Fruits Berries. Bananas. Apples. Oranges. Grapes. Papaya. Mango. Pomegranate. Kiwi. Grapefruit. Cherries. Meats and Other Protein Sources Seafood.  Lean meats, such as chicken and [place] or lean cuts of pork and beef. Tofu. Eggs. Nuts. Beans. Dairy Low-fat or fat-free dairy products, such as yogurt, cottage cheese, and cheese. Beverages Water. Tea. Coffee. Sugar-free or diet soda. Seltzer water. Milk. Milk alternatives, such as soy or almond milk. Condiments Mustard. Relish. Low-fat, low-sugar ketchup. Low-fat, low-sugar barbecue sauce. Low-fat or fat-free mayonnaise. Sweets and Desserts Sugar-free or low-fat pudding. Sugar-free or low-fat ice cream and other frozen treats. Fats and Oils Avocado. Walnuts. Olive oil. The items listed above may not be a complete list of recommended foods or beverages. Contact your dietitian for more options. What foods are not recommended? Grains Refined white flour and flour products, such as bread, pasta, snack foods, and cereals. Beverages Sweetened drinks, such as sweet iced tea and soda. Sweets and Desserts Baked goods, such as cake, cupcakes, pastries, cookies, and cheesecake. The items listed above may not be a complete list of foods and beverages to avoid. Contact your dietitian for more information. This information is not intended to replace advice given to you by your health care provider. Make sure you discuss any questions you have with your health care provider. Document Released: 11/11/2014 Document Revised: 12/03/2015 Document Reviewed: 07/23/2014 Elsevier Interactive Patient Education  2017 [employer].   IF you received an x-ray today, you will receive an invoice from [facility]. Please contact [facility] at [phone] with questions or concerns regarding your invoice.   IF you received labwork today, you will receive an invoice from [facility]. Please contact [facility] at [phone] with questions or concerns regarding your invoice.   Our billing staff will not be able to  assist you with questions regarding bills from these companies.  You will be contacted  with the lab results as soon as they are available. The fastest way to get your results is to activate your My Chart account. Instructions are located on the last page of this paperwork. If you have not heard from [place] regarding the results in 2 weeks, please contact this office.

## 2018-03-09 NOTE — Progress Notes (Signed)
Michelle Huerta  MRN: [id number] DOB: [birth year]  PCP: Dorise Hiss, PA-C  Subjective:  Pt is a 57 year old female who presents to clinic for several complaints.   Right knee pain. This is a chronic problem. She states she received an email from HR that they did not receive recent FMLA paperwork.  She received b/injection at sports medicine a few months ago. This did not help. She is seeing PT, 2x/week for 6 weeks. They extended her treatment plan. She is not feeling improvement "but they told me my measurements are improving".  She is not doing home exercises as requested by PT.  Endorses limping at work.   Flank pain. Pain with laying down on her right side at night, worsens when she rolls over.no pain during the day.  This has improved over the past few months.   She would like a blood sugar check. Endorses increased urinary frequency x 1 week. She has been told in the past she has prediabetes.  Denies other urinary symptoms.   Review of Systems  Constitutional: Negative for chills and fever.  Gastrointestinal: Negative for abdominal pain.  Genitourinary: Positive for flank pain and frequency.  Musculoskeletal: Positive for arthralgias (right knee).    Patient Active Problem List   Diagnosis Date Noted  . Bilateral knee pain 12/28/2017  . Constipation 12/18/2017  . Cough 12/15/2017  . Lower resp. tract infection 12/15/2017  . Acute conjunctivitis of right eye 12/15/2017  . H/O Paget's disease of bone 09/10/2017  . OSA on CPAP 10/28/2015  . Fibroids 08/07/2015  . Plantar fasciitis, bilateral 08/03/2015  . Metatarsal deformity 08/03/2015  . Equinus deformity of foot, acquired 08/03/2015  . Pronation deformity of both feet 08/03/2015  . Morbid obesity ([place]) 06/11/2015  . 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 Essential (primary) hypertension 06/11/2015  . 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 Benign essential HTN 09/19/2013  . Major depressive episode  09/19/2013    Current Outpatient Medications on File Prior to Visit  Medication Sig Dispense Refill  . amLODipine (NORVASC) 5 MG tablet Take 1 tablet (5 mg total) by mouth daily. 90 tablet 1  . diclofenac sodium (VOLTAREN) 1 % GEL Apply 4 g topically 4 (four) times daily. For knee pain 100 g 1  . Estradiol 10 MCG TABS vaginal tablet 1 pill per vagina every Mon, Wed, Friday 30 tablet 12  . fluticasone (FLONASE) 50 MCG/ACT nasal spray Place into both nostrils daily.    [name] telmisartan (MICARDIS) 80 MG tablet Take 1 tablet (80 mg total) by mouth daily. 90 tablet 1  . valACYclovir (VALTREX) 500 MG tablet Take 500 mg by mouth daily.    . fluticasone (FLONASE) 50 MCG/ACT nasal spray Place 2 sprays into both nostrils daily. 16 g 6  . Hydroxychloroquine Sulfate (PLAQUENIL PO) Take by mouth.    . meloxicam (MOBIC) 7.5 MG tablet Take 1-2 tablets (7.5-15 mg total) by mouth daily. (Patient not taking: Reported on 03/09/2018) 60 tablet 1  . polyethylene glycol powder (GLYCOLAX/MIRALAX) powder Take 17 g by mouth 2 (two) times daily as needed. (Patient not taking: Reported on 02/14/2018) 578 g 1  . triamcinolone ointment (KENALOG) 0.5 % Apply 1 application topically 2 (two) times daily. (Patient not taking: Reported on 03/09/2018) 30 g 0   No current facility-administered medications on file prior to visit.     Allergies  Allergen Reactions  . Penicillins Rash  . Reclast [Zoledronic Acid] Nausea And Vomiting  Objective:  BP 140/86 (BP Location: Left Arm, Patient Position: Sitting, Cuff Size: Large)   Pulse 85   Temp 98.2 F (36.8 C) (Oral)   Resp 16   Ht 5\' 8"  (1.727 m)   Wt (!) 300 lb 9.6 oz (136.4 kg)   LMP 08/01/2012   SpO2 100%   BMI 45.71 kg/m   Physical Exam  Constitutional: She is oriented to person, place, and time. No distress.  Cardiovascular: Normal rate, regular rhythm and normal heart sounds.  Abdominal: Normal appearance. There is no tenderness. There is no CVA tenderness.    Central obesity  Neurological: She is alert and oriented to person, place, and time.  Skin: Skin is warm and dry.  Psychiatric: Judgment normal.  Vitals reviewed.   Results for orders placed or performed in visit on 03/09/18  POCT urinalysis dipstick  Result Value Ref Range   Color, UA yellow yellow   Clarity, UA clear clear   Glucose, UA negative negative mg/dL   Bilirubin, UA negative negative   Ketones, POC UA negative negative mg/dL   Spec Grav, UA 1.015 1.010 - 1.025   Blood, UA negative negative   pH, UA 5.5 5.0 - 8.0   Protein Ur, POC negative negative mg/dL   Urobilinogen, UA 1.0 0.2 or 1.0 E.U./dL   Nitrite, UA Negative Negative   Leukocytes, UA Negative Negative  POCT Microscopic Urinalysis ([facility])  Result Value Ref Range   WBC,UR,HPF,POC None None WBC/hpf   RBC,UR,HPF,POC None None RBC/hpf   Bacteria None None, Too numerous to count   Mucus Absent Absent   Epithelial Cells, UR Per Microscopy Moderate (A) None, Too numerous to count cells/hpf  POCT glucose (manual entry)  Result Value Ref Range   POC Glucose 72 70 - 99 mg/dl  POCT glycosylated hemoglobin (Hb A1C)  Result Value Ref Range   Hemoglobin A1C 6.0 (A) 4.0 - 5.6 %   HbA1c POC (<> result, manual entry)     HbA1c, POC (prediabetic range)     HbA1c, POC (controlled diabetic range)     Dg Knee Complete 4 Views Right  Result Date: 03/09/2018 CLINICAL DATA:  Chronic RIGHT knee pain. EXAM: RIGHT KNEE - COMPLETE 4+ VIEW COMPARISON:  12/28/2016, 05/01/2013, 01/25/2012. FINDINGS: No evidence of acute, subacute or healed fractures. Calcified loose body in the LATERAL joint space, unchanged from the prior examinations. Mild MEDIAL compartment joint space narrowing with associated minimal spurring, unchanged since 2018, progressive since 2014. LATERAL and patellofemoral compartment joint spaces well-preserved. No visible joint effusion. Sclerotic focus in the distal femoral metaphysis, only partially imaged, visible on  the 2018 examination. IMPRESSION: 1. Mild MEDIAL compartment osteoarthritis, stable since 2018, progressive since 2014. 2. Stable calcified loose body in the LATERAL joint space. 3. Partially imaged likely benign bone infarct involving the distal femoral metaphysis, also partially visible on the 2018 examination. Electronically Signed   By: Evangeline Dakin M.D.   On: 03/09/2018 11:21   Assessment and Plan :  1. Osteoarthritis of right knee, unspecified osteoarthritis type 2. Chronic pain of right knee - Pt c/o con't knee pain. Steroid injections from sports medicine provided no relief. She is still in PT which is not helping her sympotms either. Right knee x-ray shows arthritis. FMLA papers re-faxed. Consider ortho referral in the future.  - DG Knee Complete 4 Views Right; Future  3. Urinary frequency - UA is negative.  - POCT urinalysis dipstick - POCT Microscopic Urinalysis ([facility]) - POCT glucose (manual entry) - POCT  glycosylated hemoglobin (Hb A1C)  4. Prediabetes - A1C is 6.0%. Discussed weight loss, improved lifestyle. Recheck in 1 year.   Mercer Pod, PA-C  [facility] 03/09/2018 10:31 AM  Please note: Portions of this report may have been transcribed using dragon voice recognition software. Every effort was made to ensure accuracy; however, inadvertent computerized transcription errors may be present.

## 2018-03-21 ENCOUNTER — Ambulatory Visit: Payer: 59 | Admitting: Physical Therapy

## 2018-03-22 ENCOUNTER — Telehealth: Payer: Self-pay | Admitting: Physician Assistant

## 2018-03-22 NOTE — Telephone Encounter (Signed)
Copied from [place] [id number]. Topic: General - Other >> Mar 22, 2018  3:44 PM Janace Aris A wrote: Reason for CRM: Patient called in wanting to know what her results were from her Xray she had done on 03/09/2018, patient says she never got a call from the nurse regarding this. Also patient is still having issues with her knees, says she would like to have a referral for a MRI. She feels like the Xray she had didn't show the real issues in her knee.   Please Advise   Patient says she will be off work around 4:30pm and would like a call back regarding this.

## 2018-03-22 NOTE — Telephone Encounter (Signed)
Copied from [place] [id number]. Topic: General - Other >> Mar 22, 2018  3:44 PM Michelle Huerta wrote: Reason for CRM: Patient called in wanting to know what her results were from her Xray she had done on 03/09/2018, patient says she never got Huerta call from the nurse regarding this. Also patient is still having issues with her knees, says she would like to have [facility] referral to [facility] MRI. She feels like the Xray she had didn't show the real issues in her knee.   Please Advise.   Patient says she will be off work around 4:30pm and would like Huerta call back regarding this.

## 2018-03-26 ENCOUNTER — Ambulatory Visit: Payer: 59 | Admitting: Physical Therapy

## 2018-03-28 NOTE — Telephone Encounter (Signed)
Pt is calling back and still waiting for the xray results and to see if she could get a referral for MRI. Pt would like to know if she could see orthopaedic to discuss getting gel injection in her knee

## 2018-03-30 ENCOUNTER — Ambulatory Visit (INDEPENDENT_AMBULATORY_CARE_PROVIDER_SITE_OTHER): Payer: 59 | Admitting: Family Medicine

## 2018-03-30 ENCOUNTER — Encounter (INDEPENDENT_AMBULATORY_CARE_PROVIDER_SITE_OTHER): Payer: Self-pay | Admitting: Family Medicine

## 2018-03-30 ENCOUNTER — Other Ambulatory Visit: Payer: Self-pay | Admitting: Physician Assistant

## 2018-03-30 DIAGNOSIS — M179 Osteoarthritis of knee, unspecified: Secondary | ICD-10-CM

## 2018-03-30 DIAGNOSIS — M25561 Pain in right knee: Secondary | ICD-10-CM | POA: Diagnosis not present

## 2018-03-30 DIAGNOSIS — M1710 Unilateral primary osteoarthritis, unspecified knee: Secondary | ICD-10-CM

## 2018-03-30 MED ORDER — ETODOLAC 400 MG PO TABS: 400.0000 mg | ORAL_TABLET | Freq: Two times a day (BID) | ORAL | 3 refills | Status: DC | PRN

## 2018-03-30 MED FILL — ETODOLAC 400 MG TABLET: 400 | 30 days supply | Qty: 60 | Fill #0

## 2018-03-30 NOTE — Progress Notes (Signed)
Office Visit Note   Patient: Michelle Huerta           Date of Birth: [birth year]           MRN: [id number] Visit Date: 03/30/2018 Requested by: Dorise Hiss, PA-C [place], [place] [address] PCP: Dorise Hiss, PA-C  Subjective: Chief Complaint  Patient presents with  . Right Knee - Pain    No known injury.  Pain x 3-4 months.  No improvement after steroid injections in both knees 12/27/17.    HPI: She is here with persistent right knee pain.  Long-standing intermittent problems with her knee.  Over the years she has had good results with periodic cortisone injections.  The last 2 time she had injections, she did not get much relief.  She thinks there must be something else wrong in her knee.  It gets very stiff after sitting for a while, hard to walk at first.  Sometimes it feels like it is going to give way.  She has a history of lupus and Paget's disease.              ROS: Otherwise noncontributory  Objective: Vital Signs: LMP 08/01/2012   Physical Exam:  Right knee: Trace effusion, no warmth or erythema.  1+ patellofemoral crepitus, no pain with patella compression.  Very tender on the medial joint line, pain and a palpable click with McMurray's.  No laxity with varus/valgus stress.  Imaging: No new x-rays obtained.  Previous x-rays showed moderate medial compartment DJD.  Assessment & Plan: 1.  Right knee pain, suspicious for degenerative medial meniscus tear -We will proceed with MRI scan.  [place] as needed.   Follow-Up Instructions: No follow-ups on file.       Procedures: None today.   PMFS History: Patient Active Problem List   Diagnosis Date Noted  . Osteoarthritis of right knee 03/09/2018  . Prediabetes 03/09/2018  . Bilateral knee pain 12/28/2017  . Constipation 12/18/2017  . Cough 12/15/2017  . Lower resp. tract infection 12/15/2017  . Acute conjunctivitis of right eye 12/15/2017  . H/O Paget's disease of bone 09/10/2017    . OSA on CPAP 10/28/2015  . Fibroids 08/07/2015  . Plantar fasciitis, bilateral 08/03/2015  . Metatarsal deformity 08/03/2015  . Equinus deformity of foot, acquired 08/03/2015  . Pronation deformity of both feet 08/03/2015  . Morbid obesity ([place]) 06/11/2015  . 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 Essential (primary) hypertension 06/11/2015  . 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 Benign essential HTN 09/19/2013  . Major depressive episode 09/19/2013   Past Medical History:  Diagnosis Date  . Allergy   . Asthma    no medicines  . Bilateral chronic knee pain   . Chronic back pain   . DDD (degenerative disc disease), cervical   . Depression    no meds  . Genital herpes    HSV type 2 at rectum, culture positive 3/15  . Glaucoma   . Heart murmur   . Hypertension   . Insomnia   . Lupus ([place])   . Mild sleep apnea    1/17 CPAP Dr Claiborne Billings  . Morbid obesity ([place]) 06/11/2015  . Osteoporosis   . Paget's disease   . Paget's disease of bone    Dr Trudie Reed, Reclast insufion 02/14/10 - bad reaction to infusion, now tolerates fosamax weekly  . Sleep apnea   . Thrombocytopenia ([place])   . Tubular adenoma polyp of rectum    8/12,  Dr Fuller Plan, 5 yr follow up   . Vitamin D deficiency     Family History  Problem Relation Age of Onset  . Hypertension Mother   . Heart disease Mother   . Cancer Maternal Grandmother   . Stomach cancer Paternal Grandmother   . Colon cancer Neg Hx     Past Surgical History:  Procedure Laterality Date  . AUGMENTATION MAMMAPLASTY Bilateral   . BREAST ENHANCEMENT SURGERY    . BREAST REDUCTION SURGERY    . DILATION AND CURETTAGE OF UTERUS N/A 06/24/2015   Procedure: DILATATION AND CURETTAGE;  Surgeon: Emily Filbert, MD;  Location: [facility];  Service: Gynecology;  Laterality: N/A;  . REDUCTION MAMMAPLASTY Bilateral   . TOE SURGERY     removal of bone in each foot  . TUBAL LIGATION     Social History   Occupational History  . Not on file  Tobacco Use  .  Smoking status: Former Smoker    Years: 7.00    Types: Cigars    Last attempt to quit: 08/11/2008    Years since quitting: 9.6  . Smokeless tobacco: Never Used  . Tobacco comment: smoked Black&Milds, 1 pack per day (5 in a pack)  Substance and Sexual Activity  . Alcohol use: Yes    Alcohol/week: 5.0 standard drinks    Types: 5 Cans of beer per week  . Drug use: Never  . Sexual activity: Not Currently    Birth control/protection: Surgical

## 2018-03-30 NOTE — Telephone Encounter (Signed)
We spoke about x-ray at her last OV.  Can you please maillpatient a letter with recent knee x-ray results? I put in referral to ortho

## 2018-04-02 NOTE — Telephone Encounter (Signed)
Letter sent.

## 2018-04-04 ENCOUNTER — Other Ambulatory Visit: Payer: Self-pay | Admitting: Physician Assistant

## 2018-04-04 NOTE — Telephone Encounter (Signed)
temazepam refill Last Refill:08/04/17  Last OV: historical provider PCP: Mercer Pod [place]  Controlled substance

## 2018-04-04 NOTE — Telephone Encounter (Signed)
Copied from [place] [phone]. Topic: Quick Communication - Rx Refill/Question >> Apr 04, 2018 11:28 AM Cecelia Byars, NT wrote: Medication: temazepam (RESTORIL) 30 MG capsule  Has the patient contacted their pharmacy? no: (Agent: If no, request that the patient contact the pharmacy for the refill. (Agent: If yes, when and what did the pharmacy advise? [place], [place] - [address] (Phone) [phone] (Fax)   Preferred Pharmacy (with phone number or street name    Agent: Please be advised that RX refills may take up to 3 business days. We ask that you follow-up with your pharmacy.

## 2018-04-05 ENCOUNTER — Ambulatory Visit
Admission: RE | Admit: 2018-04-05 | Discharge: 2018-04-05 | Disposition: A | Discharge status: Home / Self Care | Payer: 59 | Source: Ambulatory Visit | Attending: Family Medicine | Admitting: Family Medicine

## 2018-04-05 DIAGNOSIS — M23221 Derangement of posterior horn of medial meniscus due to old tear or injury, right knee: Secondary | ICD-10-CM | POA: Diagnosis not present

## 2018-04-05 DIAGNOSIS — M25561 Pain in right knee: Secondary | ICD-10-CM

## 2018-04-05 MED ORDER — TEMAZEPAM 30 MG PO CAPS: 30.0000 mg | ORAL_CAPSULE | Freq: Every evening | ORAL | 0 refills | Status: DC | PRN

## 2018-04-05 MED FILL — TEMAZEPAM 30 MG CAPSULE: 30 | 30 days supply | Qty: 30 | Fill #0

## 2018-04-06 ENCOUNTER — Telehealth (INDEPENDENT_AMBULATORY_CARE_PROVIDER_SITE_OTHER): Payer: Self-pay | Admitting: Family Medicine

## 2018-04-06 NOTE — Telephone Encounter (Signed)
Yes, please do.

## 2018-04-06 NOTE — Telephone Encounter (Signed)
Noted  

## 2018-04-06 NOTE — Telephone Encounter (Signed)
MRI mainly shows moderate-severe arthritis ("high-grade partial and full-thickness cartilage loss") in the knee.  The small medial meniscus tear is not the likely cause of your pain.  Unfortunately, arthroscopic surgery is probably not going to be helpful.  It may be time to consider knee replacement.  If desired, could consider a different type of injection called hyaluronic acid (examples are Supartz, Orthovisc, Synvisc, and others).  Sometimes they help when cortisone stops working.  Otherwise, if you'd prefer to consider surgery, I'll get you in to see one of our surgeons.  [place]

## 2018-04-06 NOTE — Telephone Encounter (Signed)
Talked with patient and advised her of message per Dr. Junius Roads.

## 2018-04-06 NOTE — Telephone Encounter (Signed)
Is it okay to apply for gel injection for patient?  Please advise.  Thank you

## 2018-04-06 NOTE — Telephone Encounter (Signed)
Patient would like to proceed with one of the gel injections, please submit.  Patients # [phone]

## 2018-04-09 ENCOUNTER — Encounter (INDEPENDENT_AMBULATORY_CARE_PROVIDER_SITE_OTHER): Payer: Self-pay | Admitting: Family Medicine

## 2018-04-09 ENCOUNTER — Ambulatory Visit (INDEPENDENT_AMBULATORY_CARE_PROVIDER_SITE_OTHER): Payer: 59 | Admitting: Family Medicine

## 2018-04-09 DIAGNOSIS — M25561 Pain in right knee: Secondary | ICD-10-CM

## 2018-04-09 DIAGNOSIS — G8929 Other chronic pain: Secondary | ICD-10-CM | POA: Diagnosis not present

## 2018-04-09 NOTE — Progress Notes (Signed)
Office Visit Note   Patient: Michelle Huerta           Date of Birth: 03/18/1961           MRN: [id number] Visit Date: 04/09/2018 Requested by: Dorise Hiss, PA-C [place], [place] [address] PCP: Dorise Hiss, PA-C  Subjective: Chief Complaint  Patient presents with  . Right Knee - Follow-up    Right Knee still hurts while working Taking Ibuprofen     HPI: She is here with persistent right knee pain, to discuss MRI results.  No change in symptoms.  Still having constant aching pains in her knees with occasional sharp stabbing pains.              ROS: Otherwise noncontributory  Objective: Vital Signs: LMP 08/01/2012   Physical Exam:  Right knee: Tender on the medial and lateral joint lines with 2+ patellofemoral crepitus, trace effusion.  Imaging: MRI reviewed in detail with patient showing radial tear of medial meniscus with tricompartmental DJD  Assessment & Plan: 1.  Chronic right knee pain, no longer responding to injections, with degenerative medial meniscus tear and tricompartmental DJD -Based on MRI findings I do not think she is a good candidate for arthroscopic debridement but we will have her consult with Dr. Erlinda Hong to discuss options which might include knee replacement. -We also discussed the possibility of hyaluronic acid injections but she is not optimistic that these would help.   Follow-Up Instructions: Return for To see Dr. Erlinda Hong regarding knee replacement.       Procedures: None today.   PMFS History: Patient Active Problem List   Diagnosis Date Noted  . Osteoarthritis of right knee 03/09/2018  . Prediabetes 03/09/2018  . Bilateral knee pain 12/28/2017  . Constipation 12/18/2017  . Cough 12/15/2017  . Lower resp. tract infection 12/15/2017  . Acute conjunctivitis of right eye 12/15/2017  . H/O Paget's disease of bone 09/10/2017  . OSA on CPAP 10/28/2015  . Fibroids 08/07/2015  . Plantar fasciitis, bilateral  08/03/2015  . Metatarsal deformity 08/03/2015  . Equinus deformity of foot, acquired 08/03/2015  . Pronation deformity of both feet 08/03/2015  . Morbid obesity ([place]) 06/11/2015  . 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 Essential (primary) hypertension 06/11/2015  . 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 Benign essential HTN 09/19/2013  . Major depressive episode 09/19/2013   Past Medical History:  Diagnosis Date  . Allergy   . Asthma    no medicines  . Bilateral chronic knee pain   . Chronic back pain   . DDD (degenerative disc disease), cervical   . Depression    no meds  . Genital herpes    HSV type 2 at rectum, culture positive 3/15  . Glaucoma   . Heart murmur   . Hypertension   . Insomnia   . Lupus ([place])   . Mild sleep apnea    1/17 CPAP Dr Claiborne Billings  . Morbid obesity ([place]) 06/11/2015  . Osteoporosis   . Paget's disease   . Paget's disease of bone    Dr Trudie Reed, Reclast insufion 02/14/10 - bad reaction to infusion, now tolerates fosamax weekly  . Sleep apnea   . Thrombocytopenia ([place])   . Tubular adenoma polyp of rectum    8/12, Dr Fuller Plan, 5 yr follow up   . Vitamin D deficiency     Family History  Problem Relation Age of Onset  . Hypertension Mother   . Heart disease Mother   .  Cancer Maternal Grandmother   . Stomach cancer Paternal Grandmother   . Colon cancer Neg Hx     Past Surgical History:  Procedure Laterality Date  . AUGMENTATION MAMMAPLASTY Bilateral   . BREAST ENHANCEMENT SURGERY    . BREAST REDUCTION SURGERY    . DILATION AND CURETTAGE OF UTERUS N/A 06/24/2015   Procedure: DILATATION AND CURETTAGE;  Surgeon: Emily Filbert, MD;  Location: [facility];  Service: Gynecology;  Laterality: N/A;  . REDUCTION MAMMAPLASTY Bilateral   . TOE SURGERY     removal of bone in each foot  . TUBAL LIGATION     Social History   Occupational History  . Not on file  Tobacco Use  . Smoking status: Former Smoker    Years: 7.00    Types: Cigars    Last attempt to  quit: 08/11/2008    Years since quitting: 9.6  . Smokeless tobacco: Never Used  . Tobacco comment: smoked Black&Milds, 1 pack per day (5 in a pack)  Substance and Sexual Activity  . Alcohol use: Yes    Alcohol/week: 5.0 standard drinks    Types: 5 Cans of beer per week  . Drug use: Never  . Sexual activity: Not Currently    Birth control/protection: Surgical

## 2018-04-12 ENCOUNTER — Ambulatory Visit (INDEPENDENT_AMBULATORY_CARE_PROVIDER_SITE_OTHER): Payer: 59 | Admitting: Orthopaedic Surgery

## 2018-04-16 ENCOUNTER — Telehealth (INDEPENDENT_AMBULATORY_CARE_PROVIDER_SITE_OTHER): Payer: Self-pay | Admitting: Family Medicine

## 2018-04-16 ENCOUNTER — Telehealth (INDEPENDENT_AMBULATORY_CARE_PROVIDER_SITE_OTHER): Payer: Self-pay

## 2018-04-16 NOTE — Telephone Encounter (Signed)
Patient called advised she did not go to work today. Patient said she was also out Thursday and Friday of last week.  Patient said it's difficult to put weight on her knee. Patient said she work for [employer]  and she stand and walk all day. Patient asked if she can get a note to remain out of work until she see's Dr Ninfa Linden on 04/23/18. The number to contact patient is [phone]

## 2018-04-16 NOTE — Telephone Encounter (Signed)
Submitted VOB for Monovisc, right knee. 

## 2018-04-17 ENCOUNTER — Ambulatory Visit: Payer: 59 | Admitting: Family Medicine

## 2018-04-17 NOTE — Telephone Encounter (Signed)
Please advise 

## 2018-04-17 NOTE — Progress Notes (Deleted)
No chief complaint on file.   HPI  4 review of systems  Past Medical History:  Diagnosis Date  . Allergy   . Asthma    no medicines  . Bilateral chronic knee pain   . Chronic back pain   . DDD (degenerative disc disease), cervical   . Depression    no meds  . Genital herpes    HSV type 2 at rectum, culture positive 3/15  . Glaucoma   . Heart murmur   . Hypertension   . Insomnia   . Lupus ([place])   . Mild sleep apnea    1/17 CPAP Dr Claiborne Billings  . Morbid obesity ([place]) 06/11/2015  . Osteoporosis   . Paget's disease   . Paget's disease of bone    Dr Trudie Reed, Reclast insufion 02/14/10 - bad reaction to infusion, now tolerates fosamax weekly  . Sleep apnea   . Thrombocytopenia ([place])   . Tubular adenoma polyp of rectum    8/12, Dr Fuller Plan, 5 yr follow up   . Vitamin D deficiency     Current Outpatient Medications  Medication Sig Dispense Refill  . amLODipine (NORVASC) 5 MG tablet Take 1 tablet (5 mg total) by mouth daily. 90 tablet 1  . diclofenac sodium (VOLTAREN) 1 % GEL Apply 4 g topically 4 (four) times daily. For knee pain 100 g 1  . Estradiol 10 MCG TABS vaginal tablet 1 pill per vagina every Mon, Wed, Friday 30 tablet 12  . etodolac (LODINE) 400 MG tablet Take 1 tablet (400 mg total) by mouth 2 (two) times daily as needed. 60 tablet 3  . fluticasone (FLONASE) 50 MCG/ACT nasal spray Place into both nostrils daily.    . fluticasone (FLONASE) 50 MCG/ACT nasal spray Place 2 sprays into both nostrils daily. 16 g 6  . Hydroxychloroquine Sulfate (PLAQUENIL PO) Take by mouth.    [name] ibuprofen (ADVIL,MOTRIN) 800 MG tablet TAKE 1 TABLET BY MOUTH 3 TIMES PER DAY AS NEEDED  1  . meloxicam (MOBIC) 7.5 MG tablet Take 1-2 tablets (7.5-15 mg total) by mouth daily. (Patient not taking: Reported on 03/09/2018) 60 tablet 1  . polyethylene glycol powder (GLYCOLAX/MIRALAX) powder Take 17 g by mouth 2 (two) times daily as needed. (Patient not taking: Reported on 02/14/2018) 578 g 1  . telmisartan  (MICARDIS) 80 MG tablet Take 1 tablet (80 mg total) by mouth daily. 90 tablet 1  . temazepam (RESTORIL) 30 MG capsule Take 1 capsule (30 mg total) by mouth at bedtime as needed. 30 capsule 0  . triamcinolone ointment (KENALOG) 0.5 % Apply 1 application topically 2 (two) times daily. (Patient not taking: Reported on 03/09/2018) 30 g 0  . valACYclovir (VALTREX) 500 MG tablet Take 500 mg by mouth daily.     No current facility-administered medications for this visit.     Allergies:  Allergies  Allergen Reactions  . Penicillins Rash  . Reclast [Zoledronic Acid] Nausea And Vomiting    Past Surgical History:  Procedure Laterality Date  . AUGMENTATION MAMMAPLASTY Bilateral   . BREAST ENHANCEMENT SURGERY    . BREAST REDUCTION SURGERY    . DILATION AND CURETTAGE OF UTERUS N/A 06/24/2015   Procedure: DILATATION AND CURETTAGE;  Surgeon: Emily Filbert, MD;  Location: [facility];  Service: Gynecology;  Laterality: N/A;  . REDUCTION MAMMAPLASTY Bilateral   . TOE SURGERY     removal of bone in each foot  . TUBAL LIGATION      Social History   Socioeconomic  History  . Marital status: Single    Spouse name: Not on file  . Number of children: Not on file  . Years of education: Not on file  . Highest education level: Not on file  Occupational History  . Not on file  Social Needs  . Financial resource strain: Not on file  . Food insecurity:    Worry: Not on file    Inability: Not on file  . Transportation needs:    Medical: Not on file    Non-medical: Not on file  Tobacco Use  . Smoking status: Former Smoker    Years: 7.00    Types: Cigars    Last attempt to quit: 08/11/2008    Years since quitting: 9.6  . Smokeless tobacco: Never Used  . Tobacco comment: smoked Black&Milds, 1 pack per day (5 in a pack)  Substance and Sexual Activity  . Alcohol use: Yes    Alcohol/week: 5.0 standard drinks    Types: 5 Cans of beer per week  . Drug use: Never  . Sexual activity: Not Currently    Birth  control/protection: Surgical  Lifestyle  . Physical activity:    Days per week: Not on file    Minutes per session: Not on file  . Stress: Not on file  Relationships  . Social connections:    Talks on phone: Not on file    Gets together: Not on file    Attends religious service: Not on file    Active member of club or organization: Not on file    Attends meetings of clubs or organizations: Not on file    Relationship status: Not on file  Other Topics Concern  . Not on file  Social History Narrative   Epworth sleepiness score as of 06/11/15 a 4   Exercise: yes, active at work, no formal exercise   Single, never married, in relationship off and on   Children: [place], [place] (both in [place]), 2 grandkids.  Raised 2 nieces [name], [address] (they have 5 kids)   Occupation: [employer]   Religion: faith important, not active in a church   Seatbelt use: yes    Family History  Problem Relation Age of Onset  . Hypertension Mother   . Heart disease Mother   . Cancer Maternal Grandmother   . Stomach cancer Paternal Grandmother   . Colon cancer Neg Hx      ROS Review of Systems See HPI Constitution: No fevers or chills No malaise No diaphoresis Skin: No rash or itching Eyes: no blurry vision, no double vision GU: no dysuria or hematuria Neuro: no dizziness or headaches * all others reviewed and negative   Objective: There were no vitals filed for this visit.  Physical Exam  Assessment and Plan There are no diagnoses linked to this encounter.   Amaya Blakeman P [employer]

## 2018-04-17 NOTE — Telephone Encounter (Signed)
Note printed.

## 2018-04-17 NOTE — Telephone Encounter (Signed)
Left message on patient's voice mail - signed letter left up front for pickup.

## 2018-04-18 ENCOUNTER — Ambulatory Visit: Payer: 59 | Admitting: Physician Assistant

## 2018-04-18 ENCOUNTER — Encounter: Payer: Self-pay | Admitting: Physician Assistant

## 2018-04-18 ENCOUNTER — Ambulatory Visit (INDEPENDENT_AMBULATORY_CARE_PROVIDER_SITE_OTHER): Payer: 59 | Admitting: Orthopaedic Surgery

## 2018-04-18 VITALS — BP 154/88 | HR 87 | Temp 97.9°F | Resp 17 | Ht 68.0 in | Wt 300.0 lb

## 2018-04-18 DIAGNOSIS — M25561 Pain in right knee: Secondary | ICD-10-CM | POA: Diagnosis not present

## 2018-04-18 NOTE — Patient Instructions (Addendum)
  Tenna Delaine PA-C is the best!    [place]. With Elon Alas.   [address], [place], [place] [address] [phone]  IF you received an x-ray today, you will receive an invoice from [facility]. Please contact [facility] at [phone] with questions or concerns regarding your invoice.   IF you received labwork today, you will receive an invoice from [facility]. Please contact [facility] at [phone] with questions or concerns regarding your invoice.   Our billing staff will not be able to assist you with questions regarding bills from these companies.  You will be contacted with the lab results as soon as they are available. The fastest way to get your results is to activate your My Chart account. Instructions are located on the last page of this paperwork. If you have not heard from [place] regarding the results in 2 weeks, please contact this office.

## 2018-04-18 NOTE — Progress Notes (Signed)
Michelle Huerta  MRN: [id number] DOB: 1960/10/15  PCP: Dorise Hiss, PA-C  Subjective:  Pt is a 57 year old female who presents to clinic for continued care.  She heard I was leaving the practice and wanted to know how much longer I will be here and who she should see as a provider once I'm gone.   She is seeing ortho for knee pain, recent MRI. Plan to have total knee replacement sometime this month. She has appt in 5 days with ortho for pre-op.  Review of Systems  Musculoskeletal: Positive for arthralgias and gait problem.    Patient Active Problem List   Diagnosis Date Noted  . Osteoarthritis of right knee 03/09/2018  . Prediabetes 03/09/2018  . Bilateral knee pain 12/28/2017  . Constipation 12/18/2017  . Cough 12/15/2017  . Lower resp. tract infection 12/15/2017  . Acute conjunctivitis of right eye 12/15/2017  . H/O Paget's disease of bone 09/10/2017  . OSA on CPAP 10/28/2015  . Fibroids 08/07/2015  . Plantar fasciitis, bilateral 08/03/2015  . Metatarsal deformity 08/03/2015  . Equinus deformity of foot, acquired 08/03/2015  . Pronation deformity of both feet 08/03/2015  . Morbid obesity ([place]) 06/11/2015  . 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 Essential (primary) hypertension 06/11/2015  . 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 Benign essential HTN 09/19/2013  . Major depressive episode 09/19/2013    Current Outpatient Medications on File Prior to Visit  Medication Sig Dispense Refill  . amLODipine (NORVASC) 5 MG tablet Take 1 tablet (5 mg total) by mouth daily. 90 tablet 1  . diclofenac sodium (VOLTAREN) 1 % GEL Apply 4 g topically 4 (four) times daily. For knee pain 100 g 1  . Estradiol 10 MCG TABS vaginal tablet 1 pill per vagina every Mon, Wed, Friday 30 tablet 12  . etodolac (LODINE) 400 MG tablet Take 1 tablet (400 mg total) by mouth 2 (two) times daily as needed. 60 tablet 3  . fluticasone (FLONASE) 50 MCG/ACT nasal spray Place into both nostrils  daily.    . fluticasone (FLONASE) 50 MCG/ACT nasal spray Place 2 sprays into both nostrils daily. 16 g 6  . Hydroxychloroquine Sulfate (PLAQUENIL PO) Take by mouth.    [name] ibuprofen (ADVIL,MOTRIN) 800 MG tablet TAKE 1 TABLET BY MOUTH 3 TIMES PER DAY AS NEEDED  1  . meloxicam (MOBIC) 7.5 MG tablet Take 1-2 tablets (7.5-15 mg total) by mouth daily. 60 tablet 1  . polyethylene glycol powder (GLYCOLAX/MIRALAX) powder Take 17 g by mouth 2 (two) times daily as needed. 578 g 1  . telmisartan (MICARDIS) 80 MG tablet Take 1 tablet (80 mg total) by mouth daily. 90 tablet 1  . temazepam (RESTORIL) 30 MG capsule Take 1 capsule (30 mg total) by mouth at bedtime as needed. 30 capsule 0  . triamcinolone ointment (KENALOG) 0.5 % Apply 1 application topically 2 (two) times daily. 30 g 0  . valACYclovir (VALTREX) 500 MG tablet Take 500 mg by mouth daily.     No current facility-administered medications on file prior to visit.     Allergies  Allergen Reactions  . Penicillins Rash  . Reclast [Zoledronic Acid] Nausea And Vomiting     Objective:  BP (!) 154/88   Pulse 87   Temp 97.9 F (36.6 C) (Oral)   Resp 17   Ht 5\' 8"  (1.727 m)   Wt 300 lb (136.1 kg)   LMP 08/01/2012   SpO2 98%   BMI 45.61 kg/m  Physical Exam  Constitutional: She appears well-developed and well-nourished.  Psychiatric: She has a normal mood and affect. Her behavior is normal. Judgment and thought content normal.    Assessment and Plan :  1. Right knee pain, unspecified chronicity - pt here bc she heard I was leaving the practice and wanted to know how much longer I will be here and who she should see as a provider once I'm gone. Information given for [place]. Pt plans to have total knee replacement sometime this month. She has appt in 5 days with ortho for pre-op.  Mercer Pod, PA-C  [facility] 04/18/2018 5:40 PM  Please note: Portions of this report may have been  transcribed using dragon voice recognition software. Every effort was made to ensure accuracy; however, inadvertent computerized transcription errors may be present.

## 2018-04-20 ENCOUNTER — Telehealth (INDEPENDENT_AMBULATORY_CARE_PROVIDER_SITE_OTHER): Payer: Self-pay

## 2018-04-20 NOTE — Telephone Encounter (Addendum)
Patient is approved for Monovisc, right knee. Buy & Bill Covered at 100% through her insurance Co-pay of $50.00 No PA required  Talked with patient and advised her that she was approved for the gel injection.  Patient stated that she prefers not to have gel injection and has an appointment on Monday to discuss surgery.

## 2018-04-23 ENCOUNTER — Ambulatory Visit (INDEPENDENT_AMBULATORY_CARE_PROVIDER_SITE_OTHER): Payer: 59 | Admitting: Orthopaedic Surgery

## 2018-04-23 ENCOUNTER — Encounter (INDEPENDENT_AMBULATORY_CARE_PROVIDER_SITE_OTHER): Payer: Self-pay | Admitting: Orthopaedic Surgery

## 2018-04-23 ENCOUNTER — Encounter (INDEPENDENT_AMBULATORY_CARE_PROVIDER_SITE_OTHER): Payer: Self-pay

## 2018-04-23 DIAGNOSIS — S83241D Other tear of medial meniscus, current injury, right knee, subsequent encounter: Secondary | ICD-10-CM

## 2018-04-23 DIAGNOSIS — S83241A Other tear of medial meniscus, current injury, right knee, initial encounter: Secondary | ICD-10-CM | POA: Insufficient documentation

## 2018-04-23 DIAGNOSIS — S82132A Displaced fracture of medial condyle of left tibia, initial encounter for closed fracture: Secondary | ICD-10-CM | POA: Insufficient documentation

## 2018-04-23 DIAGNOSIS — M25561 Pain in right knee: Secondary | ICD-10-CM | POA: Diagnosis not present

## 2018-04-23 DIAGNOSIS — S82132G Displaced fracture of medial condyle of left tibia, subsequent encounter for closed fracture with delayed healing: Secondary | ICD-10-CM | POA: Diagnosis not present

## 2018-04-23 DIAGNOSIS — G8929 Other chronic pain: Secondary | ICD-10-CM | POA: Diagnosis not present

## 2018-04-23 DIAGNOSIS — M1711 Unilateral primary osteoarthritis, right knee: Secondary | ICD-10-CM

## 2018-04-23 NOTE — Progress Notes (Signed)
Office Visit Note   Patient: Michelle Huerta           Date of Birth: 04/24/1961           MRN: [id number] Visit Date: 04/23/2018              Requested by: Dorise Hiss, PA-C [place], [place] [address] PCP: Dorise Hiss, PA-C   Assessment & Plan: Visit Diagnoses:  1. Chronic pain of right knee   2. Unilateral primary osteoarthritis, right knee   3. Acute medial meniscus tear of right knee, subsequent encounter     Plan: Given her morbid obesity I would not recommend knee replacement surgery.  However I do feel she is a candidate for a right knee arthroscopy with medial meniscectomy and sub-chondroplasty of both the medial tibial plateau and medial condyle based on the edema were seen.  This would then help her in terms of decreasing her pain and she can improve her mobility of that knee and then work on weight loss.  I have counseled her about her morbid obesity and how this is a limiting factor in terms of replacing her knee.  She understands that she takes weight off her frame this will help as well but I do feel that surgical intervention is warranted at this point given the failure of conservative treatment measures and arthroscopic intervention would be better to try in this situation.  She is agreeable to this.  I will keep her out of work until surgery and then at least 4 weeks after surgery.  All question concerns were answered and addressed. The patient meets the AMA guidelines for Morbid (severe) obesity with a BMI > 40.0 and I have recommended weight loss.   Follow-Up Instructions: Return for 1 week post-op.   Orders:  No orders of the defined types were placed in this encounter.  No orders of the defined types were placed in this encounter.     Procedures: No procedures performed   Clinical Data: No additional findings.   Subjective: Chief Complaint  Patient presents with  . 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  She is been out of work recently due to severe right knee pain.  She has had steroid injections in her knees and this did not help her at all.  An MRI was obtained that showed some areas of partial full-thickness cartilage loss in the medial compartment of her knee with medial meniscal tear as well.  She is sent here to the failure of conservative treatment measures and for [place] to consider some type of knee intervention in terms of surgery.  Her BMI is 46.  She is not a diabetic.  She reports daily knee pain mainly with weightbearing activities.  HPI  Review of Systems She currently denies any headache, chest pain, shortness of breath, fever, chills, nausea, vomiting.  Objective: Vital Signs: LMP 08/01/2012   Physical Exam Examination of the right knee does show medial joint line tenderness with full range of motion.  She is tender on the medial femoral condyle and medial tibial plateau.  She has a positive Murray sign the medial side of her knee.  There is slight patellofemoral crepitation. Ortho Exam  Specialty Comments:  No specialty comments available.  Imaging: No results found. MRI is reviewed of her right knee and it does show some partial cartilage loss on the medial  compartment of her knee.  The joint space still is decently maintained.  There is reactive subchondral edema in the medial femoral condyle and the medial tibial plateau with medial meniscal tear. PMFS History: Patient Active Problem List   Diagnosis Date Noted  . Osteoarthritis of right knee 03/09/2018  . Prediabetes 03/09/2018  . Bilateral knee pain 12/28/2017  . Constipation 12/18/2017  . Cough 12/15/2017  . Lower resp. tract infection 12/15/2017  . Acute conjunctivitis of right eye 12/15/2017  . H/O Paget's disease of bone 09/10/2017  . OSA on CPAP 10/28/2015  . Fibroids 08/07/2015  . Plantar fasciitis, bilateral 08/03/2015  . Metatarsal  deformity 08/03/2015  . Equinus deformity of foot, acquired 08/03/2015  . Pronation deformity of both feet 08/03/2015  . Morbid obesity ([place]) 06/11/2015  . 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 Essential (primary) hypertension 06/11/2015  . 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 Benign essential HTN 09/19/2013  . Major depressive episode 09/19/2013   Past Medical History:  Diagnosis Date  . Allergy   . Asthma    no medicines  . Bilateral chronic knee pain   . Chronic back pain   . DDD (degenerative disc disease), cervical   . Depression    no meds  . Genital herpes    HSV type 2 at rectum, culture positive 3/15  . Glaucoma   . Heart murmur   . Hypertension   . Insomnia   . Lupus ([place])   . Mild sleep apnea    1/17 CPAP Dr Claiborne Billings  . Morbid obesity ([place]) 06/11/2015  . Osteoporosis   . Paget's disease   . Paget's disease of bone    Dr Trudie Reed, Reclast insufion 02/14/10 - bad reaction to infusion, now tolerates fosamax weekly  . Sleep apnea   . Thrombocytopenia ([place])   . Tubular adenoma polyp of rectum    8/12, Dr Fuller Plan, 5 yr follow up   . Vitamin D deficiency     Family History  Problem Relation Age of Onset  . Hypertension Mother   . Heart disease Mother   . Cancer Maternal Grandmother   . Stomach cancer Paternal Grandmother   . Colon cancer Neg Hx     Past Surgical History:  Procedure Laterality Date  . AUGMENTATION MAMMAPLASTY Bilateral   . BREAST ENHANCEMENT SURGERY    . BREAST REDUCTION SURGERY    . DILATION AND CURETTAGE OF UTERUS N/A 06/24/2015   Procedure: DILATATION AND CURETTAGE;  Surgeon: Emily Filbert, MD;  Location: [facility];  Service: Gynecology;  Laterality: N/A;  . REDUCTION MAMMAPLASTY Bilateral   . TOE SURGERY     removal of bone in each foot  . TUBAL LIGATION     Social History   Occupational History  . Not on file  Tobacco Use  . Smoking status: Former Smoker    Years: 7.00    Types: Cigars    Last attempt to quit: 08/11/2008    Years since  quitting: 9.7  . Smokeless tobacco: Never Used  . Tobacco comment: smoked Black&Milds, 1 pack per day (5 in a pack)  Substance and Sexual Activity  . Alcohol use: Yes    Alcohol/week: 5.0 standard drinks    Types: 5 Cans of beer per week  . Drug use: Never  . Sexual activity: Not Currently    Birth control/protection: Surgical

## 2018-04-25 ENCOUNTER — Encounter (HOSPITAL_COMMUNITY): Payer: Self-pay

## 2018-04-25 ENCOUNTER — Other Ambulatory Visit (INDEPENDENT_AMBULATORY_CARE_PROVIDER_SITE_OTHER): Payer: Self-pay | Admitting: Physician Assistant

## 2018-04-25 NOTE — Pre-Procedure Instructions (Signed)
Last office visit note Michelle Huerta 04/18/2018 in epic

## 2018-04-25 NOTE — Patient Instructions (Signed)
Your procedure is scheduled on: Thursday, Oct. 24, 2019   Surgery Time:  10:15AM-11:38AM   Report to [place]  Entrance    Report to admitting at 7:45 AM   Call this number if you have problems the morning of surgery [phone]   Bring CPAP mask and tubing day of surgery   Do not eat food or drink liquids :After Midnight.   Brush your teeth the morning of surgery.   Do NOT smoke after Midnight   Take these medicines the morning of surgery with A SIP OF WATER: Amlodipine, Zyrtec                               You may not have any metal on your body including hair pins, jewelry, and body piercings             Do not wear make-up, lotions, powders, perfumes/cologne, or deodorant             Do not wear nail polish.  Do not shave  48 hours prior to surgery.                Do not bring valuables to the hospital. [place].   Contacts, dentures or bridgework may not be worn into surgery.    Patients discharged the day of surgery will not be allowed to drive home.   Special Instructions: Bring a copy of your healthcare power of attorney and living will documents         the day of surgery if you haven't scanned them in before.              Please read over the following fact sheets you were given:  [facility] - Preparing for Surgery Before surgery, you can play an important role.  Because skin is not sterile, your skin needs to be as free of germs as possible.  You can reduce the number of germs on your skin by washing with CHG (chlorahexidine gluconate) soap before surgery.  CHG is an antiseptic cleaner which kills germs and bonds with the skin to continue killing germs even after washing. Please DO NOT use if you have an allergy to CHG or antibacterial soaps.  If your skin becomes reddened/irritated stop using the CHG and inform your nurse when you arrive at Short Stay. Do not shave (including legs and  underarms) for at least 48 hours prior to the first CHG shower.  You may shave your face/neck.  Please follow these instructions carefully:  1.  Shower with CHG Soap the night before surgery and the  morning of surgery.  2.  If you choose to wash your hair, wash your hair first as usual with your normal  shampoo.  3.  After you shampoo, rinse your hair and body thoroughly to remove the shampoo.                             4.  Use CHG as you would any other liquid soap.  You can apply chg directly to the skin and wash.  Gently with a scrungie or clean washcloth.  5.  Apply the CHG Soap to your body ONLY FROM THE NECK DOWN.   Do   not use on  face/ open                           Wound or open sores. Avoid contact with eyes, ears mouth and   genitals (private parts).                       Wash face,  Genitals (private parts) with your normal soap.             6.  Wash thoroughly, paying special attention to the area where your    surgery  will be performed.  7.  Thoroughly rinse your body with warm water from the neck down.  8.  DO NOT shower/wash with your normal soap after using and rinsing off the CHG Soap.                9.  Pat yourself dry with a clean towel.            10.  Wear clean pajamas.            11.  Place clean sheets on your bed the night of your first shower and do not  sleep with pets. Day of Surgery : Do not apply any lotions/deodorants the morning of surgery.  Please wear clean clothes to the hospital/surgery center.  FAILURE TO FOLLOW THESE INSTRUCTIONS MAY RESULT IN THE CANCELLATION OF YOUR SURGERY  PATIENT SIGNATURE_________________________________  NURSE SIGNATURE__________________________________  ________________________________________________________________________   Michelle Huerta  An incentive spirometer is a tool that can help keep your lungs clear and active. This tool measures how well you are filling your lungs with each breath. Taking long deep  breaths may help reverse or decrease the chance of developing breathing (pulmonary) problems (especially infection) following:  A long period of time when you are unable to move or be active. BEFORE THE PROCEDURE   If the spirometer includes an indicator to show your best effort, your nurse or respiratory therapist will set it to a desired goal.  If possible, sit up straight or lean slightly forward. Try not to slouch.  Hold the incentive spirometer in an upright position. INSTRUCTIONS FOR USE  1. Sit on the edge of your bed if possible, or sit up as far as you can in bed or on a chair. 2. Hold the incentive spirometer in an upright position. 3. Breathe out normally. 4. Place the mouthpiece in your mouth and seal your lips tightly around it. 5. Breathe in slowly and as deeply as possible, raising the piston or the ball toward the top of the column. 6. Hold your breath for 3-5 seconds or for as long as possible. Allow the piston or ball to fall to the bottom of the column. 7. Remove the mouthpiece from your mouth and breathe out normally. 8. Rest for a few seconds and repeat Steps 1 through 7 at least 10 times every 1-2 hours when you are awake. Take your time and take a few normal breaths between deep breaths. 9. The spirometer may include an indicator to show your best effort. Use the indicator as a goal to work toward during each repetition. 10. After each set of 10 deep breaths, practice coughing to be sure your lungs are clear. If you have an incision (the cut made at the time of surgery), support your incision when coughing by placing a pillow or rolled up towels firmly against it. Once you are able to get out  of bed, walk around indoors and cough well. You may stop using the incentive spirometer when instructed by your caregiver.  RISKS AND COMPLICATIONS  Take your time so you do not get dizzy or light-headed.  If you are in pain, you may need to take or ask for pain medication before  doing incentive spirometry. It is harder to take a deep breath if you are having pain. AFTER USE  Rest and breathe slowly and easily.  It can be helpful to keep track of a log of your progress. Your caregiver can provide you with a simple table to help with this. If you are using the spirometer at home, follow these instructions: [place] IF:   You are having difficultly using the spirometer.  You have trouble using the spirometer as often as instructed.  Your pain medication is not giving enough relief while using the spirometer.  You develop fever of 100.5 F (38.1 C) or higher. SEEK IMMEDIATE MEDICAL CARE IF:   You cough up bloody sputum that had not been present before.  You develop fever of 102 F (38.9 C) or greater.  You develop worsening pain at or near the incision site. MAKE SURE YOU:   Understand these instructions.  Will watch your condition.  Will get help right away if you are not doing well or get worse. Document Released: 11/07/2006 Document Revised: 09/19/2011 Document Reviewed: 01/08/2007 [facility] Patient Information 2014 [place], [place].   ________________________________________________________________________

## 2018-04-26 ENCOUNTER — Ambulatory Visit (INDEPENDENT_AMBULATORY_CARE_PROVIDER_SITE_OTHER): Payer: 59 | Admitting: Physician Assistant

## 2018-04-26 ENCOUNTER — Other Ambulatory Visit: Payer: Self-pay

## 2018-04-26 ENCOUNTER — Encounter (HOSPITAL_COMMUNITY): Payer: Self-pay

## 2018-04-26 ENCOUNTER — Encounter (HOSPITAL_COMMUNITY)
Admission: RE | Admit: 2018-04-26 | Discharge: 2018-04-26 | Disposition: A | Discharge status: Home / Self Care | Payer: 59 | Source: Ambulatory Visit | Attending: Orthopaedic Surgery | Admitting: Orthopaedic Surgery

## 2018-04-26 ENCOUNTER — Encounter: Payer: Self-pay | Admitting: Physician Assistant

## 2018-04-26 VITALS — BP 130/82 | HR 102 | Temp 97.9°F | Resp 16 | Ht 67.0 in | Wt 300.6 lb

## 2018-04-26 DIAGNOSIS — R7303 Prediabetes: Secondary | ICD-10-CM | POA: Diagnosis not present

## 2018-04-26 DIAGNOSIS — Z1321 Encounter for screening for nutritional disorder: Secondary | ICD-10-CM

## 2018-04-26 DIAGNOSIS — I10 Essential (primary) hypertension: Secondary | ICD-10-CM

## 2018-04-26 DIAGNOSIS — Z13220 Encounter for screening for lipoid disorders: Secondary | ICD-10-CM | POA: Diagnosis not present

## 2018-04-26 DIAGNOSIS — Z0000 Encounter for general adult medical examination without abnormal findings: Secondary | ICD-10-CM | POA: Diagnosis not present

## 2018-04-26 DIAGNOSIS — Z1329 Encounter for screening for other suspected endocrine disorder: Secondary | ICD-10-CM | POA: Diagnosis not present

## 2018-04-26 DIAGNOSIS — Z23 Encounter for immunization: Secondary | ICD-10-CM

## 2018-04-26 DIAGNOSIS — Z01818 Encounter for other preprocedural examination: Secondary | ICD-10-CM | POA: Insufficient documentation

## 2018-04-26 DIAGNOSIS — A6000 Herpesviral infection of urogenital system, unspecified: Secondary | ICD-10-CM

## 2018-04-26 DIAGNOSIS — N952 Postmenopausal atrophic vaginitis: Secondary | ICD-10-CM

## 2018-04-26 HISTORY — DX: Plantar fascial fibromatosis: M72.2

## 2018-04-26 HISTORY — DX: Constipation, unspecified: K59.00

## 2018-04-26 HISTORY — DX: Personal history of colon polyps, unspecified: Z86.0100

## 2018-04-26 HISTORY — DX: Prediabetes: R73.03

## 2018-04-26 HISTORY — DX: Heart disease, unspecified: I51.9

## 2018-04-26 HISTORY — DX: Leiomyoma of uterus, unspecified: D25.9

## 2018-04-26 HISTORY — DX: Postmenopausal bleeding: N95.0

## 2018-04-26 HISTORY — DX: Other hemorrhoids: K64.8

## 2018-04-26 HISTORY — DX: Personal history of colonic polyps: Z86.010

## 2018-04-26 HISTORY — DX: Cardiomegaly: I51.7

## 2018-04-26 HISTORY — DX: Rheumatic tricuspid insufficiency: I07.1

## 2018-04-26 LAB — BASIC METABOLIC PANEL
ANION GAP: 6 (ref 5–15)
BUN: 13 mg/dL (ref 6–20)
CALCIUM: 9.3 mg/dL (ref 8.9–10.3)
CO2: 28 mmol/L (ref 22–32)
Chloride: 108 mmol/L (ref 98–111)
Creatinine, Ser: 0.95 mg/dL (ref 0.44–1.00)
GFR calc non Af Amer: >60 mL/min (ref >60)
Glucose, Bld: 104 mg/dL — ABNORMAL HIGH (ref 70–99)
Potassium: 4.8 mmol/L (ref 3.5–5.1)
Sodium: 142 mmol/L (ref 135–145)

## 2018-04-26 LAB — POCT URINALYSIS DIP (MANUAL ENTRY)
Bilirubin, UA: NEGATIVE
Blood, UA: NEGATIVE
Glucose, UA: NEGATIVE mg/dL
Leukocytes, UA: NEGATIVE
Nitrite, UA: NEGATIVE
Protein Ur, POC: NEGATIVE mg/dL
Spec Grav, UA: 1.025 (ref 1.010–1.025)
Urobilinogen, UA: 1 U/dL
pH, UA: 6 (ref 5.0–8.0)

## 2018-04-26 LAB — CBC
HCT: 40.9 % (ref 36.0–46.0)
Hemoglobin: 12.2 g/dL (ref 12.0–15.0)
MCH: 25.9 pg — ABNORMAL LOW (ref 26.0–34.0)
MCHC: 29.8 g/dL — ABNORMAL LOW (ref 30.0–36.0)
MCV: 86.8 fL (ref 80.0–100.0)
NRBC: 0 % (ref 0.0–0.2)
Platelets: 147 10*3/uL — ABNORMAL LOW (ref 150–400)
RBC: 4.71 MIL/uL (ref 3.87–5.11)
RDW: 14.4 % (ref 11.5–15.5)
WBC: 6 10*3/uL (ref 4.0–10.5)

## 2018-04-26 MED ORDER — AMLODIPINE BESYLATE 5 MG PO TABS: 5.0000 mg | ORAL_TABLET | Freq: Every day | ORAL | 3 refills | Status: DC

## 2018-04-26 MED ORDER — TELMISARTAN 80 MG PO TABS: 80.0000 mg | ORAL_TABLET | Freq: Every day | ORAL | 3 refills | Status: DC

## 2018-04-26 MED ORDER — VALACYCLOVIR HCL 500 MG PO TABS: 500.0000 mg | ORAL_TABLET | Freq: Every day | ORAL | 3 refills | Status: DC

## 2018-04-26 MED ORDER — ESTRADIOL 0.1 MG/GM VA CREA: TOPICAL_CREAM | VAGINAL | 12 refills | Status: DC

## 2018-04-26 MED FILL — AMLODIPINE BESYLATE 5 MG TA: 5 | 90 days supply | Qty: 90 | Fill #0

## 2018-04-26 MED FILL — VALACYCLOVIR HCL 500 MG TAB: 500 | 90 days supply | Qty: 90 | Fill #0

## 2018-04-26 MED FILL — TELMISARTAN 80 MG TABS: 80 | 90 days supply | Qty: 90 | Fill #0

## 2018-04-26 MED FILL — ESTRADIOL 0.1 MG/GM CRM: 0.1 | 90 days supply | Qty: 43 | Fill #0

## 2018-04-26 NOTE — Progress Notes (Signed)
Primary Care at [place], [place] [address] [phone]  Date:  04/26/2018   Name:  Michelle Huerta   DOB:  1961/01/07   MRN:  [id number]  PCP:  Dorise Hiss, PA-C    Chief Complaint: Annual Exam (pt will get pap in Jan. 2020 GYN, will have results sent here)   History of Present Illness:  This is a pleasant 57 y.o. female who  has a past medical history of Allergy, Bilateral chronic knee pain, Chronic back pain, Constipation, DDD (degenerative disc disease), cervical, Depression, Genital herpes, Glaucoma, Grade I diastolic dysfunction (78/46/9629), Heart murmur, History of colon polyps, Hypertension, Insomnia, Internal hemorrhoids, Lupus ([place]), LVH (left ventricular hypertrophy) (06/25/2015), Mild sleep apnea, Morbid obesity ([place]) (06/11/2015), Osteoporosis, Paget's disease, Paget's disease of bone, Plantar fasciitis, bilateral, PMB (postmenopausal bleeding), Pre-diabetes, Sleep apnea, Thrombocytopenia ([place]), TR (tricuspid regurgitation) (06/25/2015), Tubular adenoma polyp of rectum, Uterine fibroid (10/19/2017), and Vitamin D deficiency. who is presenting for CPE. She is having a knee arthroscopy with debridement next week.  She is not fasting today.  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  Lives with her son.  H/o OSA- not using her CPAP.   HTN - blood pressure today is 130/82. norvasc 5 mg, telmesartan 30mg . Denies medication side effects.   Estradiol is not helping vaginal dryness.   Cervical Cancer Screening: 04/28/2017 - negative.  Breast Cancer Screening: 07/2017 Colorectal Cancer Screening: 01/2016: adenomatous colonic polyps HIV Screening: 1 year ago STI Screening: 1 year ago Seasonal Influenza Vaccination: done in Sept.  Td/Tdap Vaccination: done today Sexually active 1-3x/month with same partner.   Frequency of Dental evaluation: regula Q6 months Frequency of Eye evaluation: 20/25 b/l. Eye doctor appt q years.   Diet: not healthy. bacon,  burger, fries, chips, soda. She does not cook at home. Eats fast food.   Wt Readings from Last 3 Encounters:  04/26/18 (!) 300 lb 9.6 oz (136.4 kg)  04/26/18 294 lb (133.4 kg)  04/18/18 300 lb (136.1 kg)   Lab Results  Component Value Date   HGBA1C 6.0 (A) 03/09/2018    Review of Systems:  Review of Systems  Constitutional: Negative for chills, diaphoresis, fatigue and fever.  HENT: Negative for congestion, postnasal drip, rhinorrhea, sinus pressure, sneezing and sore throat.   Respiratory: Negative for cough, chest tightness, shortness of breath and wheezing.   Cardiovascular: Negative for chest pain and palpitations.  Gastrointestinal: Negative for abdominal pain, diarrhea, nausea and vomiting.  Genitourinary: Negative for decreased urine volume, difficulty urinating, dysuria, enuresis, flank pain, frequency, hematuria and urgency.  Musculoskeletal: Negative for back pain.  Neurological: Negative for dizziness, weakness, light-headedness and headaches.    Patient Active Problem List   Diagnosis Date Noted  . Unilateral primary osteoarthritis, right knee 04/23/2018  . Acute medial meniscus tear of right knee 04/23/2018  . Closed fracture of medial plateau of left tibia 04/23/2018  . Severe obesity (BMI >= 40) ([place]) 04/23/2018  . Osteoarthritis of right knee 03/09/2018  . Prediabetes 03/09/2018  . Bilateral knee pain 12/28/2017  . Constipation 12/18/2017  . Cough 12/15/2017  . Lower resp. tract infection 12/15/2017  . Acute conjunctivitis of right eye 12/15/2017  . H/O Paget's disease of bone 09/10/2017  . OSA on CPAP 10/28/2015  . Fibroids 08/07/2015  . Plantar fasciitis, bilateral 08/03/2015  . Metatarsal deformity 08/03/2015  . Equinus deformity of foot, acquired 08/03/2015  . Pronation deformity of both feet 08/03/2015  . Morbid obesity ([place]) 06/11/2015  .  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 Essential (primary) hypertension 06/11/2015  . 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 Benign essential HTN 09/19/2013  . Major depressive episode 09/19/2013    Prior to Admission medications   Medication Sig Start Date End Date Taking? Authorizing Provider  amLODipine (NORVASC) 5 MG tablet Take 1 tablet (5 mg total) by mouth daily. 01/02/18  Yes Juel Bellerose, Gelene Mink, PA-C  cetirizine (ZYRTEC) 10 MG tablet Take 10 mg by mouth daily.   Yes [provider]  Estradiol 10 MCG TABS vaginal tablet 1 pill per vagina every Mon, Wed, Friday Patient taking differently: Place 10 mcg vaginally every Monday, Wednesday, and Friday.  04/28/17  Yes Dove, Myra C, MD  telmisartan (MICARDIS) 80 MG tablet Take 1 tablet (80 mg total) by mouth daily. 01/02/18  Yes Imberly Troxler, Gelene Mink, PA-C  temazepam (RESTORIL) 30 MG capsule Take 1 capsule (30 mg total) by mouth at bedtime as needed. Patient taking differently: Take 30 mg by mouth at bedtime as needed for sleep.  04/05/18  Yes Osiah Haring, Gelene Mink, PA-C  valACYclovir (VALTREX) 500 MG tablet Take 500 mg by mouth daily.   Yes [provider]  diclofenac sodium (VOLTAREN) 1 % GEL Apply 4 g topically 4 (four) times daily. For knee pain Patient not taking: Reported on 04/24/2018 02/14/18   Jaeleah Smyser, Gelene Mink, PA-C  etodolac (LODINE) 400 MG tablet Take 1 tablet (400 mg total) by mouth 2 (two) times daily as needed. Patient not taking: Reported on 04/24/2018 03/30/18   Hilts, Legrand Como, MD  fluticasone [facility]) 50 MCG/ACT nasal spray Place 2 sprays into both nostrils daily. Patient not taking: Reported on 04/26/2018 12/12/17   Kennyth Arnold, FNP  ibuprofen (ADVIL,MOTRIN) 800 MG tablet Take 800 mg by mouth every 8 (eight) hours as needed for headache or moderate pain.  02/12/18   [provider]  meloxicam (MOBIC) 7.5 MG tablet Take 1-2 tablets (7.5-15 mg total) by mouth daily. Patient not taking: Reported on 04/24/2018 12/22/17   Jequan Shahin, Gelene Mink, PA-C  polyethylene glycol powder  (GLYCOLAX/MIRALAX) powder Take 17 g by mouth 2 (two) times daily as needed. Patient not taking: Reported on 04/24/2018 12/06/17   Geraldy Akridge, Gelene Mink, PA-C  triamcinolone ointment (KENALOG) 0.5 % Apply 1 application topically 2 (two) times daily. Patient not taking: Reported on 04/24/2018 02/14/18   Milta Croson, Gelene Mink, PA-C    Allergies  Allergen Reactions  . Penicillins Rash and Other (See Comments)    Has patient had a PCN reaction causing immediate rash, facial/tongue/throat swelling, SOB or lightheadedness with hypotension: Yes Has patient had a PCN reaction causing severe rash involving mucus membranes or skin necrosis: No Has patient had a PCN reaction that required hospitalization: No Has patient had a PCN reaction occurring within the last 10 years: No If all of the above answers are "NO", then may proceed with Cephalosporin use.   [name] Reclast [Zoledronic Acid] Nausea And Vomiting    Past Surgical History:  Procedure Laterality Date  . AUGMENTATION MAMMAPLASTY Bilateral   . BREAST ENHANCEMENT SURGERY    . BREAST REDUCTION SURGERY    . COLONOSCOPY  01/19/2016  . DILATION AND CURETTAGE OF UTERUS N/A 06/24/2015   Procedure: DILATATION AND CURETTAGE;  Surgeon: Emily Filbert, MD;  Location: [facility];  Service: Gynecology;  Laterality: N/A;  . REDUCTION MAMMAPLASTY Bilateral   . TOE SURGERY     removal of bone in each foot  . TUBAL LIGATION  Social History   Tobacco Use  . Smoking status: Former Smoker    Years: 7.00    Types: Cigars    Last attempt to quit: 08/11/2008    Years since quitting: 9.7  . Smokeless tobacco: Never Used  . Tobacco comment: smoked [employer], 1 pack per day (5 in a pack)  Substance Use Topics  . Alcohol use: Yes    Alcohol/week: 5.0 standard drinks    Types: 5 Cans of beer per week    Comment: occ   . Drug use: Never    Family History  Problem Relation Age of Onset  . Hypertension Mother   . Heart disease Mother   . Hypertension  Father   . Hypertension Sister   . Cancer Maternal Grandmother   . Stomach cancer Paternal Grandmother   . Hypertension Son   . Hypertension Son   . Colon cancer Neg Hx     Medication list has been reviewed and updated.  Physical Examination:  Physical Exam  Constitutional: She is oriented to person, place, and time. She appears well-developed and well-nourished. No distress.  HENT:  Head: Normocephalic and atraumatic.  Mouth/Throat: Oropharynx is clear and moist.  Eyes: Pupils are equal, round, and reactive to light. Conjunctivae and EOM are normal.  Neck: Normal range of motion. No thyromegaly present.  Cardiovascular: Normal rate, regular rhythm and normal heart sounds.  No murmur heard. Pulmonary/Chest: Effort normal and breath sounds normal. She has no wheezes. Right breast exhibits no mass and no skin change. Left breast exhibits no mass and no skin change.  Abdominal: Soft. There is no tenderness.  Musculoskeletal: Normal range of motion.  Neurological: She is alert and oriented to person, place, and time. She has normal reflexes.  Skin: Skin is warm and dry.  Psychiatric: She has a normal mood and affect. Her behavior is normal. Judgment and thought content normal.  Vitals reviewed.   BP 130/82 (BP Location: Left Arm, Patient Position: Sitting, Cuff Size: Large)   Pulse (!) 102   Temp 97.9 F (36.6 C) (Oral)   Resp 16   Ht 5\' 7"  (1.702 m)   Wt (!) 300 lb 9.6 oz (136.4 kg)   LMP 08/01/2012   SpO2 98%   BMI 47.08 kg/m   Assessment and Plan: 1. Annual physical exam - Pt presents for annual exam. Right knee arthroscopy scheduled for next month. UTD PAP, MM and colonoscopy. Tdap given today. Encouraged improved diet. Routine labs are pending. Will contact with results. Anticipatory guidance provided.   2. Screening for endocrine disorder - POCT urinalysis dipstick - TSH  3. Screening, lipid - Lipid panel  4. Encounter for vitamin deficiency screening -  VITAMIN D 25 Hydroxy (Vit-D Deficiency, Fractures)  5. Prediabetes - Discussed dietary and lifestyle changes to prevent DM.   6. Need for Tdap vaccination   Mercer Pod, PA-C  Primary Care at [place] 04/26/2018 12:40 PM

## 2018-04-26 NOTE — Patient Instructions (Addendum)
I will be keeping good thoughts for you and your surgery!!  See you in Dec.   Estrace may not work if you aren't experiencing relief with estradiol. Try it for a few week. If no help, use water based lubricants, coconut oil or other cooking oils, vaginal estrogens.  Estrace 1 gram 1-3x/week  Your blood sugar indicates prediabetes. You can prevent diabetes with the following changes:  Preventing Type 2 Diabetes Mellitus Type 2 diabetes (type 2 diabetes mellitus) is a long-term (chronic) disease that affects blood sugar (glucose) levels. Normally, a hormone called insulin allows glucose to enter cells in the body. The cells use glucose for energy. In type 2 diabetes, one or both of these problems may be present:  The body does not make enough insulin.  The body does not respond properly to insulin that it makes (insulin resistance).  Insulin resistance or lack of insulin causes excess glucose to build up in the blood instead of going into cells. As a result, high blood glucose (hyperglycemia) develops, which can cause many complications. Being overweight or obese and having an inactive (sedentary) lifestyle can increase your risk for diabetes. Type 2 diabetes can be delayed or prevented by making certain nutrition and lifestyle changes. What nutrition changes can be made?  Eat healthy meals and snacks regularly. Keep a healthy snack with you for when you get hungry between meals, such as fruit or a handful of nuts.  Eat lean meats and proteins that are low in saturated fats, such as chicken, fish, egg whites, and beans. Avoid processed meats.  Eat plenty of fruits and vegetables and plenty of grains that have not been processed (whole grains). It is recommended that you eat: ? 1?2 cups of fruit every day. ? 2?3 cups of vegetables every day. ? 6?8 oz of whole grains every day, such as oats, whole wheat, bulgur, Mohar rice, quinoa, and millet.  Eat low-fat dairy products, such as milk,  yogurt, and cheese.  Eat foods that contain healthy fats, such as nuts, avocado, olive oil, and canola oil.  Drink water throughout the day. Avoid drinks that contain added sugar, such as soda or sweet tea.  Follow instructions from your health care provider about specific eating or drinking restrictions.  Control how much food you eat at a time (portion size). ? Check food labels to find out the serving sizes of foods. ? Use a kitchen scale to weigh amounts of foods.  Saute or steam food instead of frying it. Cook with water or broth instead of oils or butter.  Limit your intake of: ? Salt (sodium). Have no more than 1 tsp (2,400 mg) of sodium a day. If you have heart disease or high blood pressure, have less than ? tsp (1,500 mg) of sodium a day. ? Saturated fat. This is fat that is solid at room temperature, such as butter or fat on meat. What lifestyle changes can be made?  Activity  Do moderate-intensity physical activity for at least 30 minutes on at least 5 days of the week, or as much as told by your health care provider.  Ask your health care provider what activities are safe for you. A mix of physical activities may be best, such as walking, swimming, cycling, and strength training.  Try to add physical activity into your day. For example: ? Park in spots that are farther away than usual, so that you walk more. For example, park in a far corner of the parking lot when  you go to the office or the grocery store. ? Take a walk during your lunch break. ? Use stairs instead of elevators or escalators. Weight Loss  Lose weight as directed. Your health care provider can determine how much weight loss is best for you and can help you lose weight safely.  If you are overweight or obese, you may be instructed to lose at least 5?7 % of your body weight. Alcohol and Tobacco   Limit alcohol intake to no more than 1 drink a day for nonpregnant women and 2 drinks a day for men. One  drink equals 12 oz of beer, 5 oz of wine, or 1 oz of hard liquor.  Do not use any tobacco products, such as cigarettes, chewing tobacco, and e-cigarettes. If you need help quitting, ask your health care provider. Work With [facility] Provider  Have your blood glucose tested regularly, as told by your health care provider.  Discuss your risk factors and how you can reduce your risk for diabetes.  Get screening tests as told by your health care provider. You may have screening tests regularly, especially if you have certain risk factors for type 2 diabetes.  Make an appointment with a diet and nutrition specialist (registered dietitian). A registered dietitian can help you make a healthy eating plan and can help you understand portion sizes and food labels. Why are these changes important?  It is possible to prevent or delay type 2 diabetes and related health problems by making lifestyle and nutrition changes.  It can be difficult to recognize signs of type 2 diabetes. The best way to avoid possible damage to your body is to take actions to prevent the disease before you develop symptoms. What can happen if changes are not made?  Your blood glucose levels may keep increasing. Having high blood glucose for a long time is dangerous. Too much glucose in your blood can damage your blood vessels, heart, kidneys, nerves, and eyes.  You may develop prediabetes or type 2 diabetes. Type 2 diabetes can lead to many chronic health problems and complications, such as: ? Heart disease. ? Stroke. ? Blindness. ? Kidney disease. ? Depression. ? Poor circulation in the feet and legs, which could lead to surgical removal (amputation) in severe cases. Where to find support:  Ask your health care provider to recommend a registered dietitian, diabetes educator, or weight loss program.  Look for local or online weight loss groups.  Join a gym, fitness club, or outdoor activity group, such as a  walking club. Where to find more information: To learn more about diabetes and diabetes prevention, visit:  American Diabetes Association (ADA): www.diabetes.[employer] of Diabetes and Digestive and Kidney Diseases: FindSpin.nl  To learn more about healthy eating, visit:  The U.S. Department of Agriculture Scientist, research (physical sciences)), Choose My Plate: http://wiley-williams.com/  Office of Disease Prevention and Health Promotion (ODPHP), Dietary Guidelines: SurferLive.at  Summary  You can reduce your risk for type 2 diabetes by increasing your physical activity, eating healthy foods, and losing weight as directed.  Talk with your health care provider about your risk for type 2 diabetes. Ask about any blood tests or screening tests that you need to have. This information is not intended to replace advice given to you by your health care provider. Make sure you discuss any questions you have with your health care provider. Document Released: 10/19/2015 Document Revised: 12/03/2015 Document Reviewed: 08/18/2015 Elsevier Interactive Patient Education  [employer].  If you have lab work done today you will be contacted with your lab results within the next 2 weeks.  If you have not heard from [place] then please contact us. The fastest way to get your results is to register for My Chart.  Keeping You Healthy   Get These Tests  Blood Pressure- Have your blood pressure checked by your healthcare provider at least once a year.  Normal blood pressure is 120/80.  Weight- Have your body mass index (BMI) calculated to screen for obesity.  BMI is a measure of body fat based on height and weight.  You can calculate your own BMI at GravelBags.it  Cholesterol- Have your cholesterol checked every year.  Diabetes- Have your blood sugar checked every year if you have high blood pressure, high cholesterol, a family history of  diabetes or if you are overweight.  Pap Test - Have a pap test every 1 to 5 years if you have been sexually active.  If you are older than 65 and recent pap tests have been normal you may not need additional pap tests.  In addition, if you have had a hysterectomy  for benign disease additional pap tests are not necessary.  Mammogram-Yearly mammograms are essential for early detection of breast cancer  Screening for Colon Cancer- Colonoscopy starting at age 43. Screening may begin sooner depending on your family history and other health conditions.  Follow up colonoscopy as directed by your Gastroenterologist.  Screening for Osteoporosis- Screening begins at age 7 with bone density scanning, sooner if you are at higher risk for developing Osteoporosis.   Get these medicines  Calcium with Vitamin D- Your body requires 1200-1500 mg of Calcium a day and [phone] IU of Vitamin D a day.  You can only absorb 500 mg of Calcium at a time therefore Calcium must be taken in 2 or 3 separate doses throughout the day.  Hormones- Hormone therapy has been associated with increased risk for certain cancers and heart disease.  Talk to your healthcare provider about if you need relief from menopausal symptoms.  Aspirin- Ask your healthcare provider about taking Aspirin to prevent Heart Disease and Stroke.   Get these Immuniztions  Flu shot- Every fall  Pneumonia shot- Once after the age of 15; if you are younger ask your healthcare provider if you need a pneumonia shot.  Tetanus- Every ten years.  Zostavax- Once after the age of 49 to prevent shingles.   Take these steps  Don't smoke- Your healthcare provider can help you quit. For tips on how to quit, ask your healthcare provider or go to www.smokefree.gov or call 1-800 QUIT-NOW.  Be physically active- Exercise 5 days a week for a minimum of 30 minutes.  If you are not already physically active, start slow and gradually work up to 30 minutes of  moderate physical activity.  Try walking, dancing, bike riding, swimming, etc.  Eat a healthy diet- Eat a variety of healthy foods such as fruits, vegetables, whole grains, low fat milk, low fat cheeses, yogurt, lean meats, chicken, fish, eggs, dried beans, tofu, etc.  For more information go to www.thenutritionsource.org  Dental visit- Brush and floss teeth twice daily; visit your dentist twice a year.  Eye exam- Visit your Optometrist or Ophthalmologist yearly.  Drink alcohol in moderation- Limit alcohol intake to one drink or less a day.  Never drink and drive.  Depression- Your emotional health is as important as your physical health.  If you're feeling down or  losing interest in things you normally enjoy, please talk to your healthcare provider.  Seat Belts- can save your life; always wear one  Smoke/Carbon Monoxide detectors- These detectors need to be installed on the appropriate level of your home.  Replace batteries at least once a year.  Violence- If anyone is threatening or hurting you, please tell your healthcare provider.  Living Will/ Health care power of attorney- Discuss with your healthcare provider and family.  IF you received an x-ray today, you will receive an invoice from [facility]. Please contact [facility] at [phone] with questions or concerns regarding your invoice.   IF you received labwork today, you will receive an invoice from [facility]. Please contact [facility] at [phone] with questions or concerns regarding your invoice.   Our billing staff will not be able to assist you with questions regarding bills from these companies.  You will be contacted with the lab results as soon as they are available. The fastest way to get your results is to activate your My Chart account. Instructions are located on the last page of this paperwork. If you have not heard from [place] regarding the results in 2 weeks, please contact this office.

## 2018-04-27 LAB — LIPID PANEL
Chol/HDL Ratio: 2.1 ratio (ref 0.0–4.4)
Cholesterol, Total: 141 mg/dL (ref 100–199)
HDL: 67 mg/dL (ref >39)
LDL Calculated: 39 mg/dL (ref 0–99)
Triglycerides: 177 mg/dL — ABNORMAL HIGH (ref 0–149)
VLDL Cholesterol Cal: 35 mg/dL (ref 5–40)

## 2018-04-27 LAB — VITAMIN D 25 HYDROXY (VIT D DEFICIENCY, FRACTURES): Vit D, 25-Hydroxy: 17.9 ng/mL — ABNORMAL LOW (ref 30.0–100.0)

## 2018-04-27 LAB — TSH: TSH: 1.22 u[IU]/mL (ref 0.450–4.500)

## 2018-04-30 ENCOUNTER — Other Ambulatory Visit: Payer: Self-pay

## 2018-04-30 ENCOUNTER — Encounter (HOSPITAL_BASED_OUTPATIENT_CLINIC_OR_DEPARTMENT_OTHER): Payer: Self-pay | Admitting: *Deleted

## 2018-04-30 ENCOUNTER — Emergency Department (HOSPITAL_BASED_OUTPATIENT_CLINIC_OR_DEPARTMENT_OTHER)
Admission: EM | Admit: 2018-04-30 | Discharge: 2018-05-01 | Disposition: A | Discharge status: Home / Self Care | Payer: 59 | Attending: Emergency Medicine | Admitting: Emergency Medicine

## 2018-04-30 ENCOUNTER — Emergency Department (HOSPITAL_COMMUNITY): Admission: EM | Admit: 2018-04-30 | Discharge: 2018-04-30 | Discharge status: Absent without leave | Payer: 59

## 2018-04-30 DIAGNOSIS — W208XXA Other cause of strike by thrown, projected or falling object, initial encounter: Secondary | ICD-10-CM | POA: Insufficient documentation

## 2018-04-30 DIAGNOSIS — Z79899 Other long term (current) drug therapy: Secondary | ICD-10-CM | POA: Insufficient documentation

## 2018-04-30 DIAGNOSIS — Y999 Unspecified external cause status: Secondary | ICD-10-CM | POA: Insufficient documentation

## 2018-04-30 DIAGNOSIS — S91201A Unspecified open wound of right great toe with damage to nail, initial encounter: Secondary | ICD-10-CM | POA: Diagnosis not present

## 2018-04-30 DIAGNOSIS — I10 Essential (primary) hypertension: Secondary | ICD-10-CM | POA: Diagnosis not present

## 2018-04-30 DIAGNOSIS — Y939 Activity, unspecified: Secondary | ICD-10-CM | POA: Insufficient documentation

## 2018-04-30 DIAGNOSIS — Z87891 Personal history of nicotine dependence: Secondary | ICD-10-CM | POA: Diagnosis not present

## 2018-04-30 DIAGNOSIS — Y929 Unspecified place or not applicable: Secondary | ICD-10-CM | POA: Diagnosis not present

## 2018-04-30 DIAGNOSIS — S91209A Unspecified open wound of unspecified toe(s) with damage to nail, initial encounter: Secondary | ICD-10-CM | POA: Insufficient documentation

## 2018-04-30 NOTE — ED Triage Notes (Signed)
Right great toe injury. She hit it on a chair causing her nail to partially come off.

## 2018-05-01 ENCOUNTER — Emergency Department (HOSPITAL_BASED_OUTPATIENT_CLINIC_OR_DEPARTMENT_OTHER): Payer: 59

## 2018-05-01 DIAGNOSIS — Z87891 Personal history of nicotine dependence: Secondary | ICD-10-CM | POA: Diagnosis not present

## 2018-05-01 DIAGNOSIS — S91209A Unspecified open wound of unspecified toe(s) with damage to nail, initial encounter: Secondary | ICD-10-CM | POA: Diagnosis not present

## 2018-05-01 DIAGNOSIS — I10 Essential (primary) hypertension: Secondary | ICD-10-CM | POA: Diagnosis not present

## 2018-05-01 DIAGNOSIS — Z79899 Other long term (current) drug therapy: Secondary | ICD-10-CM | POA: Diagnosis not present

## 2018-05-01 DIAGNOSIS — S99921A Unspecified injury of right foot, initial encounter: Secondary | ICD-10-CM | POA: Diagnosis not present

## 2018-05-01 MED ORDER — LIDOCAINE HCL (PF) 1 % IJ SOLN
15.0000 mL | Freq: Once | INTRAMUSCULAR | Status: AC
  Administered 2018-05-01: 15 mL
  Filled 2018-05-01: qty 15

## 2018-05-01 NOTE — ED Notes (Signed)
Patient transported to X-ray 

## 2018-05-01 NOTE — Discharge Instructions (Signed)
Please see the information and instructions below regarding your visit.  Your diagnoses today include:  1. Avulsion of toenail, initial encounter     Tests performed today include: See side panel of your discharge paperwork for testing performed today. Vital signs are listed at the bottom of these instructions.   Xray showed no broken bones or foreign body.  Medications prescribed:    Take any prescribed medications only as prescribed, and any over the counter medications only as directed on the packaging.  Home care instructions:  Please follow any educational materials contained in this packet.   Change the dressing daily. Please apply the Xeroform first, followed by the nonadherent dressing. Wash the toe daily to keep it clean prior to placing the bandage.   You may soak the toe in a warm water rinse of 50% hydrogen peroxide and 50% water.   Once you no longer need the xeroform dressing (scab has formed) you may apply the Bacitracin ointment.    Return instructions:  Please return to the Emergency Department if you experience worsening symptoms.  Please return for any worsening pain, drainage that is green, yellow, or puslike, increasing swelling or redness spreading of the toe. Please return if you have any other emergent concerns.  Additional Information:   Your vital signs today were: BP 132/81    Pulse 90    Temp 97.9 F (36.6 C) (Oral)    Resp 16    Ht 5\' 7"  (1.702 m)    Wt (!) 136.3 kg    LMP 08/01/2012    SpO2 100%    BMI 47.06 kg/m  If your blood pressure (BP) was elevated on multiple readings during this visit above 130 for the top number or above 80 for the bottom number, please have this repeated by your primary care provider within one month. --------------  Thank you for allowing [place] to participate in your care today.

## 2018-05-01 NOTE — ED Notes (Signed)
ED Provider at bedside. 

## 2018-05-01 NOTE — ED Provider Notes (Signed)
[facility] EMERGENCY DEPARTMENT Provider Note   CSN: [id number] Arrival date & time: 04/30/18  2223     History   Chief Complaint Chief Complaint  Patient presents with  . Toe Injury    HPI Michelle Huerta is a 57 y.o. female.  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  Patient reports proximally 7 PM tonight, she dropped a chair onto her toe, and noticed some bleeding.  She took her sock off and noticed that she had avulsed the medial aspect of her right great toe.  Patient reports full sensation.  Patient does not take blood thinners.  Patient not diabetic.  Tetanus shot updated one week ago.  Of note, patient is scheduled for total knee arthroplasty in 2 days.  Past Medical History:  Diagnosis Date  . Allergy   . Bilateral chronic knee pain   . Chronic back pain   . Constipation   . DDD (degenerative disc disease), cervical   . Depression    no meds  . Genital herpes    HSV type 2 at rectum, culture positive 3/15  . Glaucoma   . Grade I diastolic dysfunction 16/01/3709   noted on ECHO   . Heart murmur   . History of colon polyps   . Hypertension   . Insomnia   . Internal hemorrhoids   . Lupus ([place])   . LVH (left ventricular hypertrophy) 06/25/2015   Mild, noted on ECHO   . Mild sleep apnea    1/17 CPAP Dr Claiborne Billings does not use cpap   . Morbid obesity ([place]) 06/11/2015  . Osteoporosis   . Paget's disease   . Paget's disease of bone    Dr Trudie Reed, Reclast insufion 02/14/10 - bad reaction to infusion, now tolerates fosamax weekly  . Plantar fasciitis, bilateral   . PMB (postmenopausal bleeding)   . Pre-diabetes   . Sleep apnea   . Thrombocytopenia ([place])   . TR (tricuspid regurgitation) 06/25/2015   Trace, noted on ECHO   . Tubular adenoma polyp of rectum    8/12, Dr Fuller Plan, 5 yr follow up   . Uterine  fibroid 10/19/2017   noted on CT pelvis  . Vitamin D deficiency     Patient Active Problem List   Diagnosis Date Noted  . Unilateral primary osteoarthritis, right knee 04/23/2018  . Acute medial meniscus tear of right knee 04/23/2018  . Closed fracture of medial plateau of left tibia 04/23/2018  . Severe obesity (BMI >= 40) ([place]) 04/23/2018  . Osteoarthritis of right knee 03/09/2018  . Prediabetes 03/09/2018  . Bilateral knee pain 12/28/2017  . Constipation 12/18/2017  . Cough 12/15/2017  . Lower resp. tract infection 12/15/2017  . Acute conjunctivitis of right eye 12/15/2017  . H/O Paget's disease of bone 09/10/2017  . OSA on CPAP 10/28/2015  . Fibroids 08/07/2015  . Plantar fasciitis, bilateral 08/03/2015  . Metatarsal deformity 08/03/2015  . Equinus deformity of foot, acquired 08/03/2015  . Pronation deformity of both feet 08/03/2015  . Morbid obesity ([place]) 06/11/2015  . 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 Essential (primary) hypertension 06/11/2015  . 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 Benign essential HTN 09/19/2013  . Major depressive episode 09/19/2013    Past Surgical History:  Procedure Laterality Date  . AUGMENTATION MAMMAPLASTY Bilateral   . BREAST ENHANCEMENT SURGERY    .  BREAST REDUCTION SURGERY    . COLONOSCOPY  01/19/2016  . DILATION AND CURETTAGE OF UTERUS N/A 06/24/2015   Procedure: DILATATION AND CURETTAGE;  Surgeon: Emily Filbert, MD;  Location: [facility];  Service: Gynecology;  Laterality: N/A;  . REDUCTION MAMMAPLASTY Bilateral   . TOE SURGERY     removal of bone in each foot  . TUBAL LIGATION       OB History   None      Home Medications    Prior to Admission medications   Medication Sig Start Date End Date Taking? Authorizing Provider  amLODipine (NORVASC) 5 MG tablet Take 1 tablet (5 mg total) by mouth daily. 04/26/18   McVey, Gelene Mink, PA-C  cetirizine (ZYRTEC) 10 MG tablet Take 10 mg by mouth daily.    [provider]  estradiol (ESTRACE) 0.1 MG/GM vaginal cream Apply 1g 1 to 3 times per week 04/26/18   McVey, Gelene Mink, PA-C  etodolac (LODINE) 400 MG tablet Take 1 tablet (400 mg total) by mouth 2 (two) times daily as needed. Patient not taking: Reported on 04/24/2018 03/30/18   Hilts, Legrand Como, MD  fluticasone [facility]) 50 MCG/ACT nasal spray Place 2 sprays into both nostrils daily. Patient not taking: Reported on 04/26/2018 12/12/17   Kennyth Arnold, FNP  ibuprofen (ADVIL,MOTRIN) 800 MG tablet Take 800 mg by mouth every 8 (eight) hours as needed for headache or moderate pain.  02/12/18   [provider]  telmisartan (MICARDIS) 80 MG tablet Take 1 tablet (80 mg total) by mouth daily. 04/26/18   McVey, Gelene Mink, PA-C  valACYclovir (VALTREX) 500 MG tablet Take 1 tablet (500 mg total) by mouth daily. 04/26/18   McVey, Gelene Mink, PA-C    Family History Family History  Problem Relation Age of Onset  . Hypertension Mother   . Heart disease Mother   . Hypertension Father   . Hypertension Sister   . Cancer Maternal Grandmother   . Stomach cancer Paternal Grandmother   . Hypertension Son   . Hypertension Son   . Colon cancer Neg Hx     Social History Social History   Tobacco Use  . Smoking status: Former Smoker    Years: 7.00    Types: Cigars    Last attempt to quit: 08/11/2008    Years since quitting: 9.7  . Smokeless tobacco: Never Used  . Tobacco comment: smoked Black&Milds, 1 pack per day (5 in a pack)  Substance Use Topics  . Alcohol use: Yes    Alcohol/week: 5.0 standard drinks    Types: 5 Cans of beer per week    Comment: occ   . Drug use: Never     Allergies   Penicillins and Reclast [zoledronic acid]   Review of Systems Review of Systems  Musculoskeletal: Positive for arthralgias.  Skin: Positive for wound.  Neurological: Negative for numbness.  Hematological: Does not bruise/bleed easily.     Physical Exam Updated Vital  Signs BP 132/81   Pulse 90   Temp 97.9 F (36.6 C) (Oral)   Resp 16   Ht 5\' 7"  (1.702 m)   Wt (!) 136.3 kg   LMP 08/01/2012   SpO2 100%   BMI 47.06 kg/m   Physical Exam  Constitutional: She appears well-developed and well-nourished. No distress.  Sitting comfortably in bed.  HENT:  Head: Normocephalic and atraumatic.  Eyes: Conjunctivae are normal. Right eye exhibits no discharge. Left eye exhibits no discharge.  EOMs normal to gross examination.  Neck: Normal range of motion.  Cardiovascular: Normal rate and regular rhythm.  Intact, 2+ right DP pulse.  Pulmonary/Chest:  Normal respiratory effort. Patient converses comfortably. No audible wheeze or stridor.  Abdominal: She exhibits no distension.  Musculoskeletal: Normal range of motion.  Neurological: She is alert.  Cranial nerves intact to gross observation. Patient moves extremities without difficulty.  Skin: Skin is warm and dry. She is not diaphoretic.  Right great toe exhibits avulsion of the medial aspect.  The nailbed is intact, but there is hematoma of the nailbed.  Patient has minor active bleeding.  Psychiatric: She has a normal mood and affect. Her behavior is normal. Judgment and thought content normal.  Nursing note and vitals reviewed.    ED Treatments / Results  Labs (all labs ordered are listed, but only abnormal results are displayed) Labs Reviewed - No data to display  EKG None  Radiology Dg Toe Great Right  Result Date: 05/01/2018 CLINICAL DATA:  Hit toe on chair. EXAM: RIGHT GREAT TOE COMPARISON:  None. FINDINGS: No acute bony abnormality. Specifically, no fracture, subluxation, or dislocation. Soft tissues intact. Joint spaces maintained. IMPRESSION: No acute bony abnormality. Electronically Signed   By: Rolm Baptise M.D.   On: 05/01/2018 00:52    Procedures Procedures (including critical care time)  Medications Ordered in ED Medications  lidocaine (PF) (XYLOCAINE) 1 % injection 15 mL (15  mLs Infiltration Given 05/01/18 0027)     Initial Impression / Assessment and Plan / ED Course  I have reviewed the triage vital signs and the nursing notes.  Pertinent labs & imaging results that were available during my care of the patient were reviewed by me and considered in my medical decision making (see chart for details).     Patient well-appearing and neurovascularly intact in the right lower extremity.  Patient with avulsion injury.  X-ray demonstrates no bony injury or foreign body.  The damage toenail was debrided and irrigated.  Toenail removal not performed, as the nailbed was intact and the toenail can still splint to the nailbed.  I discussed with the patient the risk that she may naturally lose the toenail.  Cleaning and dressing changes were discussed with the patient.  Return precautions are given for any increasing redness, swelling, or drainage.  Patient is in understanding and agrees with the plan of care.  Final Clinical Impressions(s) / ED Diagnoses   Final diagnoses:  Avulsion of toenail, initial encounter    ED Discharge Orders    None       Albesa Seen, PA-C 05/01/18 0159    Orpah Greek, MD 05/01/18 [phone]

## 2018-05-03 ENCOUNTER — Ambulatory Visit (HOSPITAL_COMMUNITY): Payer: 59 | Admitting: Certified Registered Nurse Anesthetist

## 2018-05-03 ENCOUNTER — Ambulatory Visit (HOSPITAL_COMMUNITY)
Admission: RE | Admit: 2018-05-03 | Discharge: 2018-05-03 | Disposition: A | Discharge status: Home / Self Care | Payer: 59 | Source: Other Acute Inpatient Hospital | Attending: Orthopaedic Surgery | Admitting: Orthopaedic Surgery

## 2018-05-03 ENCOUNTER — Encounter (HOSPITAL_COMMUNITY)
Admission: RE | Disposition: A | Discharge status: Home / Self Care | Payer: Self-pay | Source: Other Acute Inpatient Hospital | Attending: Orthopaedic Surgery

## 2018-05-03 ENCOUNTER — Telehealth (INDEPENDENT_AMBULATORY_CARE_PROVIDER_SITE_OTHER): Payer: Self-pay

## 2018-05-03 ENCOUNTER — Encounter (HOSPITAL_COMMUNITY): Payer: Self-pay | Admitting: *Deleted

## 2018-05-03 DIAGNOSIS — M329 Systemic lupus erythematosus, unspecified: Secondary | ICD-10-CM | POA: Insufficient documentation

## 2018-05-03 DIAGNOSIS — Z79899 Other long term (current) drug therapy: Secondary | ICD-10-CM | POA: Insufficient documentation

## 2018-05-03 DIAGNOSIS — Z6841 Body Mass Index (BMI) 40.0 and over, adult: Secondary | ICD-10-CM | POA: Insufficient documentation

## 2018-05-03 DIAGNOSIS — M84351A Stress fracture, right femur, initial encounter for fracture: Secondary | ICD-10-CM | POA: Insufficient documentation

## 2018-05-03 DIAGNOSIS — M1711 Unilateral primary osteoarthritis, right knee: Secondary | ICD-10-CM | POA: Diagnosis not present

## 2018-05-03 DIAGNOSIS — Z87891 Personal history of nicotine dependence: Secondary | ICD-10-CM | POA: Insufficient documentation

## 2018-05-03 DIAGNOSIS — X58XXXA Exposure to other specified factors, initial encounter: Secondary | ICD-10-CM | POA: Insufficient documentation

## 2018-05-03 DIAGNOSIS — M810 Age-related osteoporosis without current pathological fracture: Secondary | ICD-10-CM | POA: Diagnosis not present

## 2018-05-03 DIAGNOSIS — S82141A Displaced bicondylar fracture of right tibia, initial encounter for closed fracture: Secondary | ICD-10-CM

## 2018-05-03 DIAGNOSIS — S83241A Other tear of medial meniscus, current injury, right knee, initial encounter: Secondary | ICD-10-CM | POA: Diagnosis not present

## 2018-05-03 DIAGNOSIS — G4730 Sleep apnea, unspecified: Secondary | ICD-10-CM | POA: Diagnosis not present

## 2018-05-03 DIAGNOSIS — Z79890 Hormone replacement therapy (postmenopausal): Secondary | ICD-10-CM | POA: Insufficient documentation

## 2018-05-03 DIAGNOSIS — I10 Essential (primary) hypertension: Secondary | ICD-10-CM | POA: Diagnosis not present

## 2018-05-03 HISTORY — PX: KNEE ARTHROSCOPY WITH SUBCHONDROPLASTY: SHX6732

## 2018-05-03 SURGERY — ARTHROSCOPY, KNEE, WITH SUBCHONDROPLASTY
Anesthesia: General | Site: Knee | Laterality: Right

## 2018-05-03 MED ORDER — CHLORHEXIDINE GLUCONATE 4 % EX LIQD: 60.0000 mL | Freq: Once | CUTANEOUS | Status: DC

## 2018-05-03 MED ORDER — CLINDAMYCIN PHOSPHATE 900 MG/50ML IV SOLN
900.0000 mg | INTRAVENOUS | Status: AC
  Administered 2018-05-03: 900 mg via INTRAVENOUS
  Filled 2018-05-03: qty 50

## 2018-05-03 MED ORDER — FENTANYL CITRATE (PF) 100 MCG/2ML IJ SOLN
INTRAMUSCULAR | Status: AC
  Filled 2018-05-03: qty 2

## 2018-05-03 MED ORDER — PROPOFOL 10 MG/ML IV BOLUS
INTRAVENOUS | Status: DC | PRN
  Administered 2018-05-03: 200 mg via INTRAVENOUS
  Administered 2018-05-03: 50 mg via INTRAVENOUS

## 2018-05-03 MED ORDER — METHOCARBAMOL 500 MG IVPB - SIMPLE MED
500.0000 mg | Freq: Once | INTRAVENOUS | Status: AC
  Administered 2018-05-03: 500 mg via INTRAVENOUS

## 2018-05-03 MED ORDER — FENTANYL CITRATE (PF) 100 MCG/2ML IJ SOLN
INTRAMUSCULAR | Status: DC | PRN
  Administered 2018-05-03 (×4): 50 ug via INTRAVENOUS

## 2018-05-03 MED ORDER — ACETAMINOPHEN 10 MG/ML IV SOLN
1000.0000 mg | Freq: Once | INTRAVENOUS | Status: AC
  Administered 2018-05-03: 1000 mg via INTRAVENOUS

## 2018-05-03 MED ORDER — BUPIVACAINE HCL (PF) 0.5 % IJ SOLN
INTRAMUSCULAR | Status: DC | PRN
  Administered 2018-05-03: 10 mL
  Administered 2018-05-03: 20 mL

## 2018-05-03 MED ORDER — ONDANSETRON HCL 4 MG/2ML IJ SOLN
INTRAMUSCULAR | Status: AC
  Filled 2018-05-03: qty 2

## 2018-05-03 MED ORDER — LACTATED RINGERS IV SOLN
INTRAVENOUS | Status: DC
  Administered 2018-05-03: 09:00:00 via INTRAVENOUS

## 2018-05-03 MED ORDER — LACTATED RINGERS IR SOLN
Status: DC | PRN
  Administered 2018-05-03: 6000 mL

## 2018-05-03 MED ORDER — MORPHINE SULFATE (PF) 4 MG/ML IV SOLN
INTRAVENOUS | Status: AC
  Filled 2018-05-03: qty 1

## 2018-05-03 MED ORDER — PROPOFOL 10 MG/ML IV BOLUS
INTRAVENOUS | Status: AC
  Filled 2018-05-03: qty 20

## 2018-05-03 MED ORDER — FENTANYL CITRATE (PF) 100 MCG/2ML IJ SOLN
25.0000 ug | INTRAMUSCULAR | Status: DC | PRN
  Administered 2018-05-03: 25 ug via INTRAVENOUS

## 2018-05-03 MED ORDER — LIDOCAINE 2% (20 MG/ML) 5 ML SYRINGE
INTRAMUSCULAR | Status: AC
  Filled 2018-05-03: qty 5

## 2018-05-03 MED ORDER — BUPIVACAINE HCL (PF) 0.5 % IJ SOLN
INTRAMUSCULAR | Status: AC
  Filled 2018-05-03: qty 30

## 2018-05-03 MED ORDER — DEXAMETHASONE SODIUM PHOSPHATE 10 MG/ML IJ SOLN
INTRAMUSCULAR | Status: DC | PRN
  Administered 2018-05-03: 10 mg via INTRAVENOUS

## 2018-05-03 MED ORDER — OXYCODONE HCL 5 MG PO TABS: 5.0000 mg | ORAL_TABLET | Freq: Once | ORAL | Status: DC | PRN

## 2018-05-03 MED ORDER — LIDOCAINE 2% (20 MG/ML) 5 ML SYRINGE
INTRAMUSCULAR | Status: DC | PRN
  Administered 2018-05-03: 100 mg via INTRAVENOUS

## 2018-05-03 MED ORDER — ONDANSETRON HCL 4 MG/2ML IJ SOLN
INTRAMUSCULAR | Status: DC | PRN
  Administered 2018-05-03: 4 mg via INTRAVENOUS

## 2018-05-03 MED ORDER — ONDANSETRON HCL 4 MG/2ML IJ SOLN: 4.0000 mg | Freq: Once | INTRAMUSCULAR | Status: DC | PRN

## 2018-05-03 MED ORDER — OXYCODONE HCL 5 MG PO TABS: 5.0000 mg | ORAL_TABLET | ORAL | 0 refills | Status: DC | PRN

## 2018-05-03 MED ORDER — OXYCODONE HCL 5 MG/5ML PO SOLN
5.0000 mg | Freq: Once | ORAL | Status: DC | PRN
  Filled 2018-05-03: qty 5

## 2018-05-03 MED ORDER — METHOCARBAMOL 500 MG IVPB - SIMPLE MED
INTRAVENOUS | Status: AC
  Filled 2018-05-03: qty 50

## 2018-05-03 MED ORDER — DEXAMETHASONE SODIUM PHOSPHATE 10 MG/ML IJ SOLN
INTRAMUSCULAR | Status: AC
  Filled 2018-05-03: qty 1

## 2018-05-03 MED ORDER — ACETAMINOPHEN 10 MG/ML IV SOLN
INTRAVENOUS | Status: AC
  Filled 2018-05-03: qty 100

## 2018-05-03 MED FILL — oxyCODONE HCL 5 MG TABS: 5 | 3 days supply | Qty: 40 | Fill #0

## 2018-05-03 SURGICAL SUPPLY — 28 items
BANDAGE ACE 6X5 VEL STRL LF (GAUZE/BANDAGES/DRESSINGS) ×2 IMPLANT
BANDAGE ELASTIC 6 VELCRO ST LF (GAUZE/BANDAGES/DRESSINGS) ×2 IMPLANT
BLADE CUDA SHAVER 3.5 (BLADE) ×2 IMPLANT
CANNULA ACCUPORT 11G 120M END (CANNULA) ×2 IMPLANT
COVER SURGICAL LIGHT HANDLE (MISCELLANEOUS) ×2 IMPLANT
COVER WAND RF STERILE (DRAPES) IMPLANT
DRAPE U-SHAPE 47X51 STRL (DRAPES) ×2 IMPLANT
DRSG PAD ABDOMINAL 8X10 ST (GAUZE/BANDAGES/DRESSINGS) ×2 IMPLANT
DURAPREP 26ML APPLICATOR (WOUND CARE) ×2 IMPLANT
GAUZE SPONGE 4X4 12PLY STRL (GAUZE/BANDAGES/DRESSINGS) ×2 IMPLANT
GAUZE XEROFORM 1X8 LF (GAUZE/BANDAGES/DRESSINGS) ×2 IMPLANT
GLOVE BIO SURGEON STRL SZ7.5 (GLOVE) ×2 IMPLANT
GLOVE BIOGEL PI IND STRL 8 (GLOVE) ×2 IMPLANT
GLOVE BIOGEL PI INDICATOR 8 (GLOVE) ×2
GLOVE ECLIPSE 8.0 STRL XLNG CF (GLOVE) ×2 IMPLANT
GOWN STRL REUS W/TWL XL LVL3 (GOWN DISPOSABLE) ×4 IMPLANT
KIT MIXER ACCUMIX (KITS) ×2 IMPLANT
KNEE KIT SCP W/SIDE ACCUPORT (Joint) ×2 IMPLANT
LAWSON 121839 AccuPort Cannula ×2 IMPLANT
MANIFOLD NEPTUNE II (INSTRUMENTS) ×2 IMPLANT
PACK ARTHROSCOPY WL (CUSTOM PROCEDURE TRAY) ×2 IMPLANT
PAD ABD 8X10 STRL (GAUZE/BANDAGES/DRESSINGS) ×2 IMPLANT
PADDING CAST COTTON 6X4 STRL (CAST SUPPLIES) ×2 IMPLANT
POSITIONER SURGICAL ARM (MISCELLANEOUS) ×2 IMPLANT
SUT ETHILON 4 0 PS 2 18 (SUTURE) ×2 IMPLANT
TUBING ARTHRO INFLOW-ONLY STRL (TUBING) ×2 IMPLANT
WAND HAND CNTRL MULTIVAC 90 (MISCELLANEOUS) IMPLANT
WRAP KNEE MAXI GEL POST OP (GAUZE/BANDAGES/DRESSINGS) ×2 IMPLANT

## 2018-05-03 NOTE — Discharge Instructions (Signed)
Expect right knee swelling - ice and elevation as needed. You may put all of your weight on your right leg/knee as comfort allows. Increase your activities as comfort allows. Do pump your feet several times daily to prevent blood clots and do take a 325 mg over-the-counter aspirin daily for 1 week. You can remove your dressings in 24 hours and get your incisions wet in the shower daily. Place new band-aids over your incisions daily.

## 2018-05-03 NOTE — Op Note (Signed)
NAME: Michelle Huerta, Michelle Huerta MEDICAL RECORD KZ:[id number] ACCOUNT [id number] DATE OF BIRTH:27-Feb-1961 FACILITY: [facility] LOCATION: [facility] PHYSICIAN:Tane Biegler Kerry Fort, MD  OPERATIVE REPORT  DATE OF PROCEDURE:  05/03/2018  PREOPERATIVE DIAGNOSES: 1.  Right knee with medial meniscal tear as well as subchondral edema in the medial femoral condyle and medial tibial plateau from medial compartment osteoarthritis. 2.  Morbid obesity with BMI of 47.  POSTOPERATIVE DIAGNOSES:   1.  Right knee with medial meniscal tear as well as subchondral edema in the medial femoral condyle and medial tibial plateau from medial compartment osteoarthritis. 2.  Morbid obesity with BMI of 47.  PROCEDURE: 1.  Right knee arthroscopy with partial medial meniscectomy. 2.  Chondroplasty medial femoral condyle and medial tibial plateau. 3.  Subchondroplasty of the medial femoral condyle and the medial tibial plateau and areas of edema seen on MRI using calcium pyrophosphate substance.  SURGEON:  Lind Guest. Ninfa Linden, MD  ASSISTANT:  Benita Stabile, PA-C   ANESTHESIA: 1.  General. 2.  Local with plain Marcaine.  ESTIMATED BLOOD LOSS:  Minimal.  COMPLICATIONS:  None.  INDICATIONS:  The patient is a very pleasant and active 57 year old who works at [employer].  She does have a BMI of 47.  She has continuous right knee pain.  An MRI of her knee did show areas of cartilage loss in the medial femoral condyle and medial  tibial plateau with a medial meniscal tear, but there is significant edema in the subchondral bone of the medial femoral condyle and medial tibial plateau.  Right now, she needs to work on quad strengthening exercise and weight loss.  We are trying  surgical intervention to hopefully temporize her pain so she can lose weight because I do feel that she is too significantly morbidly obese to proceed with knee replacement surgery.  She understands our intention with the surgery as well as the risks and  benefits involved.  She does wish to proceed.  DESCRIPTION OF PROCEDURE:  After informed consent was obtained and appropriate right knee was marked, she was brought to the operating room and placed on the operating table.  General anesthesia was then obtained.  Her right thigh, knee, leg, ankle and  foot were prepped and draped with DuraPrep and sterile drapes including a sterile stockinette with the bed raised and a lateral leg post utilized.  The right operative knee was flexed off the side table.  A time-out was called to identify correct  patient, correct right knee.  We then made an anterolateral arthroscopy portal and inserted a cannula in the knee.  There was moderate effusion.  We placed the camera in the knee with the medial compartment and did find areas of full-thickness cartilage  loss on the medial femoral condyle and medial tibial plateau and a small area of the weightbearing surface.  We did find a posterior horn root tear at the meniscal root.  Using an anterior medial portal, we did perform a partial medial meniscectomy of  this area in the back of the meniscus.  We did perform a chondroplasty on just a small area of the medial femoral condyle and the medial tibial plateau.  The ACL and PCL were found to be intact, and the lateral side looked good.  After we were done with  our arthroscopic portion of the case and drained all the fluid from the knee, we closed the portal sites with interrupted nylon suture.  Using fluoroscopic guidance, we then placed temporary drill pins in the medial femoral  condyle and the medial tibial  plateau after reviewing the MRI to see where maximum points of subchondral edema were suggesting stress fractures in these areas.  We then placed our mixture of calcium pyrophosphate bone substance and cemented in these areas.  Once 8 minutes had passed  to allow this to harden, we removed the guide pins.  We then placed a well-padded sterile dressing on her knee.  She  was awakened, extubated, and taken to recovery room in stable condition.  All final counts were correct.  There were no complications  noted.  Postoperatively, she will be discharged home from the PACU with weightbearing as tolerated with slow return to activity as comfort allows.  She will follow up in the office in a week.  LN/NUANCE  D:05/03/2018 T:05/03/2018 JOB:[id number]

## 2018-05-03 NOTE — Transfer of Care (Signed)
Immediate Anesthesia Transfer of Care Note  Patient: Michelle Huerta  Procedure(s) Performed: RIGHT KNEE ARTHROSCOPY WITH DEBRIDEMENT, PARTIAL MEDIAL MENISCECTOMY SUBCHONDROPLASTY MEDIAL TIBIAL PLATEAU AND MEDIAL FEMORAL CONDYLE (Right Knee)  Patient Location: PACU  Anesthesia Type:General  Level of Consciousness: drowsy and patient cooperative  Airway & Oxygen Therapy: Patient Spontanous Breathing and Patient connected to face mask oxygen  Post-op Assessment: Report given to RN and Post -op Vital signs reviewed and stable  Post vital signs: Reviewed and stable  Last Vitals:  Vitals Value Taken Time  BP    Temp    Pulse    Resp    SpO2      Last Pain:  Vitals:   05/03/18 0828  TempSrc:   PainSc: 0-No pain      Patients Stated Pain Goal: 3 (78/67/67 2094)  Complications: No apparent anesthesia complications

## 2018-05-03 NOTE — Anesthesia Preprocedure Evaluation (Addendum)
Anesthesia Evaluation  Patient identified by MRN, date of birth, ID band Patient awake    Reviewed: Allergy & Precautions, NPO status , Patient's Chart, lab work & pertinent test results  Airway Mallampati: III  TM Distance: >3 FB Neck ROM: Full    Dental  (+) Teeth Intact, Dental Advisory Given   Pulmonary former smoker,    breath sounds clear to auscultation       Cardiovascular hypertension,  Rhythm:Regular Rate:Normal     Neuro/Psych    GI/Hepatic   Endo/Other    Renal/GU      Musculoskeletal   Abdominal (+) + obese,   Peds  Hematology   Anesthesia Other Findings   Reproductive/Obstetrics                            Anesthesia Physical Anesthesia Plan  ASA: III  Anesthesia Plan: General   Post-op Pain Management:    Induction: Intravenous  PONV Risk Score and Plan: Ondansetron and Dexamethasone  Airway Management Planned: LMA  Additional Equipment:   Intra-op Plan:   Post-operative Plan: Extubation in OR  Informed Consent: I have reviewed the patients History and Physical, chart, labs and discussed the procedure including the risks, benefits and alternatives for the proposed anesthesia with the patient or authorized representative who has indicated his/her understanding and acceptance.     Plan Discussed with: Anesthesiologist and CRNA  Anesthesia Plan Comments:         Anesthesia Quick Evaluation

## 2018-05-03 NOTE — Telephone Encounter (Signed)
Patient called stating that Oxycodone is not helping with the pain.  Patient had Right Knee surgery today.  Cb# is [phone].  Please advise.  Thank You.

## 2018-05-03 NOTE — Brief Op Note (Signed)
05/03/2018  11:20 AM  PATIENT:  Michelle Huerta  57 y.o. female  PRE-OPERATIVE DIAGNOSIS:  right knee moderate osteoarthritis, subchondral stress fracture, medial meniscal tear  POST-OPERATIVE DIAGNOSIS:  right knee moderate osteoarthritis, subchondral stress fracture, medial meniscal tear  PROCEDURE:  Procedure(s): RIGHT KNEE ARTHROSCOPY WITH DEBRIDEMENT, PARTIAL MEDIAL MENISCECTOMY SUBCHONDROPLASTY MEDIAL TIBIAL PLATEAU AND MEDIAL FEMORAL CONDYLE (Right)  SURGEON:  Surgeon(s) and Role:    Mcarthur Rossetti, MD - Primary  PHYSICIAN ASSISTANT: Benita Stabile, PA-C  ANESTHESIA:   local and general  EBL:  10 mL   COUNTS:  YES  DICTATION: .Other Dictation: Dictation Number [id number]  PLAN OF CARE: Discharge to home after PACU  PATIENT DISPOSITION:  PACU - hemodynamically stable.   Delay start of Pharmacological VTE agent (>24hrs) due to surgical blood loss or risk of bleeding: no

## 2018-05-03 NOTE — Anesthesia Postprocedure Evaluation (Signed)
Anesthesia Post Note  Patient: Michelle Huerta  Procedure(s) Performed: RIGHT KNEE ARTHROSCOPY WITH DEBRIDEMENT, PARTIAL MEDIAL MENISCECTOMY SUBCHONDROPLASTY MEDIAL TIBIAL PLATEAU AND MEDIAL FEMORAL CONDYLE (Right Knee)     Patient location during evaluation: PACU Anesthesia Type: General Level of consciousness: awake and alert Pain management: pain level controlled Vital Signs Assessment: post-procedure vital signs reviewed and stable Respiratory status: spontaneous breathing, nonlabored ventilation, respiratory function stable and patient connected to nasal cannula oxygen Cardiovascular status: blood pressure returned to baseline and stable Postop Assessment: no apparent nausea or vomiting Anesthetic complications: no    Last Vitals:  Vitals:   05/03/18 1145 05/03/18 1200  BP: (!) 152/115 (!) 164/103  Pulse: 78 74  Resp: 14 14  Temp:    SpO2: 99% 100%    Last Pain:  Vitals:   05/03/18 1200  TempSrc:   PainSc: 2           RLE Sensation: Full sensation (05/03/18 1200)   R Sensory Level: S1-Sole of foot, small toes (05/03/18 1200)  Darcel Zick COKER

## 2018-05-03 NOTE — Anesthesia Procedure Notes (Signed)
Procedure Name: LMA Insertion Date/Time: 05/03/2018 10:23 AM Performed by: Montel Clock, CRNA Pre-anesthesia Checklist: Patient identified, Emergency Drugs available, Suction available, Patient being monitored and Timeout performed Patient Re-evaluated:Patient Re-evaluated prior to induction Oxygen Delivery Method: Circle system utilized Preoxygenation: Pre-oxygenation with 100% oxygen Induction Type: IV induction LMA: LMA with gastric port inserted LMA Size: 4.0 Number of attempts: 1 Dental Injury: Teeth and Oropharynx as per pre-operative assessment  Comments: Required additional propofol after placement to deepen anesthesia.

## 2018-05-03 NOTE — H&P (Signed)
Michelle Huerta is an 57 y.o. female.   Chief Complaint: right knee pain HPI:   57 yo female with acute right knee pain.  She is morbidly obese with a BMI of 47.  A right knee MRI does show a medial meniscal tear as well as edema in the subchondral areas of the medial femoral condyle and medial tibial plateau under the wet bearing surfaces.  This is consistent with stress fractures in these areas.  She has failed conservative treatment and now wishes to proceed with a surgical intervention.  We have recommended a knee arthroscopy with a partial medial meniscectomy as well as a subchondralplasty to treat the stress fractures.  She is too obese for knee replacement surgery.  She has been counseled on weight loss.  Past Medical History:  Diagnosis Date  . Allergy   . Bilateral chronic knee pain   . Chronic back pain   . Constipation   . DDD (degenerative disc disease), cervical   . Depression    no meds  . Genital herpes    HSV type 2 at rectum, culture positive 3/15  . Glaucoma   . Grade I diastolic dysfunction 16/04/9603   noted on ECHO   . Heart murmur   . History of colon polyps   . Hypertension   . Insomnia   . Internal hemorrhoids   . Lupus ([place])   . LVH (left ventricular hypertrophy) 06/25/2015   Mild, noted on ECHO   . Mild sleep apnea    1/17 CPAP Dr Claiborne Billings does not use cpap   . Morbid obesity ([place]) 06/11/2015  . Osteoporosis   . Paget's disease   . Paget's disease of bone    Dr Trudie Reed, Reclast insufion 02/14/10 - bad reaction to infusion, now tolerates fosamax weekly  . Plantar fasciitis, bilateral   . PMB (postmenopausal bleeding)   . Pre-diabetes   . Sleep apnea   . Thrombocytopenia ([place])   . TR (tricuspid regurgitation) 06/25/2015   Trace, noted on ECHO   . Tubular adenoma polyp of rectum    8/12, Dr Fuller Plan, 5 yr follow up   . Uterine fibroid 10/19/2017   noted on CT pelvis  . Vitamin D deficiency     Past Surgical History:  Procedure Laterality Date  . AUGMENTATION  MAMMAPLASTY Bilateral   . BREAST ENHANCEMENT SURGERY    . BREAST REDUCTION SURGERY    . COLONOSCOPY  01/19/2016  . DILATION AND CURETTAGE OF UTERUS N/A 06/24/2015   Procedure: DILATATION AND CURETTAGE;  Surgeon: Emily Filbert, MD;  Location: [facility];  Service: Gynecology;  Laterality: N/A;  . REDUCTION MAMMAPLASTY Bilateral   . TOE SURGERY     removal of bone in each foot  . TUBAL LIGATION      Family History  Problem Relation Age of Onset  . Hypertension Mother   . Heart disease Mother   . Hypertension Father   . Hypertension Sister   . Cancer Maternal Grandmother   . Stomach cancer Paternal Grandmother   . Hypertension Son   . Hypertension Son   . Colon cancer Neg Hx    Social History:  reports that she quit smoking about 9 years ago. Her smoking use included cigars. She quit after 7.00 years of use. She has never used smokeless tobacco. She reports that she drinks about 5.0 standard drinks of alcohol per week. She reports that she does not use drugs.  Allergies:  Allergies  Allergen Reactions  . Penicillins  Rash and Other (See Comments)    Has patient had a PCN reaction causing immediate rash, facial/tongue/throat swelling, SOB or lightheadedness with hypotension: Yes Has patient had a PCN reaction causing severe rash involving mucus membranes or skin necrosis: No Has patient had a PCN reaction that required hospitalization: No Has patient had a PCN reaction occurring within the last 10 years: No If all of the above answers are "NO", then may proceed with Cephalosporin use.   [name] Reclast [Zoledronic Acid] Nausea And Vomiting    Medications Prior to Admission  Medication Sig Dispense Refill  . amLODipine (NORVASC) 5 MG tablet Take 1 tablet (5 mg total) by mouth daily. 90 tablet 3  . cetirizine (ZYRTEC) 10 MG tablet Take 10 mg by mouth daily.    [name] estradiol (ESTRACE) 0.1 MG/GM vaginal cream Apply 1g 1 to 3 times per week 42.5 g 12  . ibuprofen (ADVIL,MOTRIN) 800 MG tablet Take  800 mg by mouth every 8 (eight) hours as needed for headache or moderate pain.   1  . telmisartan (MICARDIS) 80 MG tablet Take 1 tablet (80 mg total) by mouth daily. 90 tablet 3  . valACYclovir (VALTREX) 500 MG tablet Take 1 tablet (500 mg total) by mouth daily. 90 tablet 3  . etodolac (LODINE) 400 MG tablet Take 1 tablet (400 mg total) by mouth 2 (two) times daily as needed. (Patient not taking: Reported on 04/24/2018) 60 tablet 3  . fluticasone (FLONASE) 50 MCG/ACT nasal spray Place 2 sprays into both nostrils daily. (Patient not taking: Reported on 04/26/2018) 16 g 6    No results found for this or any previous visit (from the past 48 hour(s)). No results found.  Review of Systems  Musculoskeletal: Positive for joint pain.  All other systems reviewed and are negative.   Blood pressure (!) 154/105, pulse 82, temperature 98.3 F (36.8 C), temperature source Oral, resp. rate 18, last menstrual period 08/01/2012, SpO2 100 %. Physical Exam  Constitutional: She is oriented to person, place, and time. She appears well-developed and well-nourished.  HENT:  Head: Normocephalic and atraumatic.  Eyes: Pupils are equal, round, and reactive to light.  Neck: Normal range of motion.  Cardiovascular: Normal rate.  Respiratory: Effort normal.  GI: Soft.  Musculoskeletal:       Right knee: She exhibits swelling, effusion, abnormal alignment and bony tenderness. Tenderness found. Medial joint line tenderness noted.  Neurological: She is alert and oriented to person, place, and time.  Skin: Skin is warm and dry.  Psychiatric: She has a normal mood and affect.     Assessment/Plan Right knee medial meniscal tear with medial compartment arthritic changes.  To the OR today for a right knee arthroscopy.  Risks and benefits of surgery have been discussed in detail.  Mcarthur Rossetti, MD 05/03/2018, 9:57 AM

## 2018-05-04 ENCOUNTER — Other Ambulatory Visit (INDEPENDENT_AMBULATORY_CARE_PROVIDER_SITE_OTHER): Payer: Self-pay

## 2018-05-04 MED ORDER — TIZANIDINE HCL 4 MG PO TABS: 4.0000 mg | ORAL_TABLET | Freq: Four times a day (QID) | ORAL | 0 refills | Status: DC | PRN

## 2018-05-04 MED ORDER — IBUPROFEN 800 MG PO TABS: 800.0000 mg | ORAL_TABLET | Freq: Three times a day (TID) | ORAL | 0 refills | Status: DC | PRN

## 2018-05-04 NOTE — Telephone Encounter (Signed)
Please advise 

## 2018-05-04 NOTE — Telephone Encounter (Signed)
LMOM for patient letting her know I called these into her [place]

## 2018-05-04 NOTE — Telephone Encounter (Signed)
Please send in Ibuprofen 800 mg to take 3 times daily with meals as needed, #90 and Zanaflex 4mg , 1 every 8 hours as needed, #60.  Thanks

## 2018-05-08 ENCOUNTER — Encounter (HOSPITAL_COMMUNITY): Payer: Self-pay | Admitting: Orthopaedic Surgery

## 2018-05-09 ENCOUNTER — Ambulatory Visit (INDEPENDENT_AMBULATORY_CARE_PROVIDER_SITE_OTHER): Payer: 59

## 2018-05-09 ENCOUNTER — Ambulatory Visit (INDEPENDENT_AMBULATORY_CARE_PROVIDER_SITE_OTHER): Payer: 59 | Admitting: Orthopaedic Surgery

## 2018-05-09 ENCOUNTER — Encounter (INDEPENDENT_AMBULATORY_CARE_PROVIDER_SITE_OTHER): Payer: Self-pay | Admitting: Orthopaedic Surgery

## 2018-05-09 DIAGNOSIS — M25561 Pain in right knee: Secondary | ICD-10-CM

## 2018-05-09 DIAGNOSIS — G8929 Other chronic pain: Secondary | ICD-10-CM | POA: Diagnosis not present

## 2018-05-09 DIAGNOSIS — Z9889 Other specified postprocedural states: Secondary | ICD-10-CM

## 2018-05-09 DIAGNOSIS — M1711 Unilateral primary osteoarthritis, right knee: Secondary | ICD-10-CM

## 2018-05-09 MED ORDER — HYDROCODONE-ACETAMINOPHEN 5-325 MG PO TABS: 1.0000 | ORAL_TABLET | Freq: Four times a day (QID) | ORAL | 0 refills | Status: DC | PRN

## 2018-05-09 MED FILL — HYDROCODON-APAP 5-325: 5-325 | 4 days supply | Qty: 30 | Fill #0

## 2018-05-09 NOTE — Progress Notes (Signed)
The patient is a 57 year old who is 1 week out from a right knee arthroscopy in which we performed a sub-chondroplasty of the medial tibial plateau and the medial femoral condyle due to subchondral edema and stress fractures as it relates to her morbid obesity and severe arthritis in her right knee.  Arthroscopically in the joint we did find areas of full-thickness cartilage loss on the medial tibial plateau and the medial femoral condyle.  She is ambulating using a walker and certainly having pain in her knee as it relates to her surgery and this is certainly reasonable pain to have based on performing some chondroplasties.  On exam she has some mild swelling about her right knee.  The incisions of all healed nicely so I remove the sutures.  I did obtain 2 views of her right knee and you can see where we did place the calcium phosphate bone biologic cement in the areas of her knee to hopefully help with the stress on this part of her knee.  I did counsel her extensively about weight loss as a relates to her BMI of being 47.1.  I showed her x-rays and she understands that we cannot proceed with knee replacement surgery until she is able to lose some weight and improve on her BMI.  She understands that as well.  I would like to send her both to physical therapy to work on her balance and coordination and strength in the muscles around her knees.  Also would like to send her to [facility] bariatric clinic and Dr. Leafy Ro to help her work with weight loss.  She is very motivated.  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  She is out of work thus far until November 27 but I will see her right before then to see if she is ready to return.  All questions concerns were answered and addressed.

## 2018-05-10 ENCOUNTER — Other Ambulatory Visit (INDEPENDENT_AMBULATORY_CARE_PROVIDER_SITE_OTHER): Payer: Self-pay

## 2018-05-10 DIAGNOSIS — Z9889 Other specified postprocedural states: Secondary | ICD-10-CM

## 2018-05-10 DIAGNOSIS — E663 Overweight: Secondary | ICD-10-CM

## 2018-05-17 ENCOUNTER — Encounter (INDEPENDENT_AMBULATORY_CARE_PROVIDER_SITE_OTHER): Payer: 59

## 2018-05-18 ENCOUNTER — Ambulatory Visit: Payer: 59 | Admitting: Family Medicine

## 2018-05-18 ENCOUNTER — Other Ambulatory Visit: Payer: Self-pay

## 2018-05-18 ENCOUNTER — Encounter: Payer: Self-pay | Admitting: Family Medicine

## 2018-05-18 VITALS — BP 142/86 | HR 86 | Temp 97.8°F | Ht 66.5 in | Wt 299.2 lb

## 2018-05-18 DIAGNOSIS — S91209A Unspecified open wound of unspecified toe(s) with damage to nail, initial encounter: Secondary | ICD-10-CM

## 2018-05-18 DIAGNOSIS — S91201A Unspecified open wound of right great toe with damage to nail, initial encounter: Secondary | ICD-10-CM

## 2018-05-18 NOTE — Patient Instructions (Addendum)
Nail removed without difficulty.  Keep nailbed clean and covered. If any pain or new concerns as new nail grows in, return for recheck.   Thanks for coming in today.   Nail Avulsion Nail avulsion is when a nail tears away from the nail bed due to an accident or an injury. Nail avulsion can be painful. Your finger or toe may bleed a lot, and you may have some pain, redness, throbbing, and swelling while it heals. Your nail will grow back within several months. Once it grows back, it might not look the same. This may happen even after taking good care of it. Follow these instructions at home: Wound care   Clean any dirt and debris from the wound.  If you notice bleeding, press gently on the nailbed with a gauze pad. Do this for 15 minutes.  If a health care provider closed your wound with stitches (sutures), leave them in place. They may need to stay in place for 2 weeks or longer. You may need to see your health care provider to have them removed.  Keep the wound dry for 48 hours. After 48 hours have passed, lightly wash the finger or toe in warm, soapy water 2-3 times a day. This helps to reduce pain and swelling and prevent infection. Dressing Care  Cover the wound with a clean gauze bandage (dressing). You may be able to stop wearing a dressing after 2?7 days.  Wash your hands with soap and water before you change your dressing. If soap and water are not available, use hand sanitizer.  Change the dressing once or twice a day. Always change the dressing: ? If the dressing gets wet or dirty. ? After washing your finger or toe. Medicine  Take over-the-counter pain medicine as needed. Do not take aspirin or products containing aspirin unless directed by your health care provider. These products can increase bleeding.  If you were prescribed an antibiotic medicine, take it or apply it as told by your health care provider. Do not stop taking or using the antibiotic even if you start to  feel better. General instructions  Keep the hand or foot with the nail injury raised above the level of your heart as much as possible. This helps to reduce pain and swelling.  Move the toe or finger often to avoid stiffness.  Do not smoke. Smoking can delay healing. If you need help quitting, talk to your health care provider. Contact a health care provider if:  You have swelling or pain that gets worse instead of better.  You have fluid, blood, or pus coming from your wound.  Your wound smells bad. Get help right away if:  You have bleeding that does not stop, even when you apply pressure to the wound.  You have a temperature that is higher than 104F (40C).  You cannot move your fingers or toes.  The affected finger or toe looks white or black. This information is not intended to replace advice given to you by your health care provider. Make sure you discuss any questions you have with your health care provider. Document Released: 08/04/2004 Document Revised: 02/25/2016 Document Reviewed: 11/12/2014 Elsevier Interactive Patient Education  [employer].   If you have lab work done today you will be contacted with your lab results within the next 2 weeks.  If you have not heard from [place] then please contact us. The fastest way to get your results is to register for My Chart.   IF  you received an x-ray today, you will receive an invoice from [facility]. Please contact [facility] at [phone] with questions or concerns regarding your invoice.   IF you received labwork today, you will receive an invoice from [place]. Please contact [facility] at [phone] with questions or concerns regarding your invoice.   Our billing staff will not be able to assist you with questions regarding bills from these companies.  You will be contacted with the lab results as soon as they are available. The fastest way to get your results is to activate your My Chart  account. Instructions are located on the last page of this paperwork. If you have not heard from [place] regarding the results in 2 weeks, please contact this office.

## 2018-05-18 NOTE — Progress Notes (Signed)
Subjective:  By signing my name below, I, Essence Howell, attest that this documentation has been prepared under the direction and in the presence of Wendie Agreste, MD Electronically Signed: Ladene Artist, ED Scribe 05/18/2018 at 4:53 PM.   Patient ID: Michelle Huerta, female    DOB: 03/24/1961, 57 y.o.   MRN: [id number]  Chief Complaint  Patient presents with  . rt big toe nail falling off    happened 16 days ago   HPI Michelle Huerta is a 57 y.o. female who presents to Primary Care at [facility] complaining of R great toe nail injury that occurred 16 days ago. Pt states that she dropped a chair on her R great toenail, striking the corner of the toenail and lifting her nail up. She was seen at the ED following the incident on 10/21. States she has been soaking her foot daily since and noticed more lifting of the toenail which seems to get caught on her sock. Denies pain. Reports h/o prev R great ingrown toenail and wedge surgery sev yrs ago.  Patient Active Problem List   Diagnosis Date Noted  . Status post arthroscopic surgery of right knee 05/09/2018  . Chronic pain of right knee 05/09/2018  . Closed fracture of right tibial plateau   . Unilateral primary osteoarthritis, right knee 04/23/2018  . Acute medial meniscus tear of right knee 04/23/2018  . Closed fracture of medial plateau of left tibia 04/23/2018  . Severe obesity (BMI >= 40) ([place]) 04/23/2018  . Osteoarthritis of right knee 03/09/2018  . Prediabetes 03/09/2018  . Bilateral knee pain 12/28/2017  . Constipation 12/18/2017  . Cough 12/15/2017  . Lower resp. tract infection 12/15/2017  . Acute conjunctivitis of right eye 12/15/2017  . H/O Paget's disease of bone 09/10/2017  . OSA on CPAP 10/28/2015  . Fibroids 08/07/2015  . Plantar fasciitis, bilateral 08/03/2015  . Metatarsal deformity 08/03/2015  . Equinus deformity of foot, acquired 08/03/2015  . Pronation deformity of both feet 08/03/2015  . Morbid obesity ([place])  06/11/2015  . 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 Essential (primary) hypertension 06/11/2015  . 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 Benign essential HTN 09/19/2013  . Major depressive episode 09/19/2013   Past Medical History:  Diagnosis Date  . Allergy   . Bilateral chronic knee pain   . Chronic back pain   . Constipation   . DDD (degenerative disc disease), cervical   . Depression    no meds  . Genital herpes    HSV type 2 at rectum, culture positive 3/15  . Glaucoma   . Grade I diastolic dysfunction 99/24/2683   noted on ECHO   . Heart murmur   . History of colon polyps   . Hypertension   . Insomnia   . Internal hemorrhoids   . Lupus ([place])   . LVH (left ventricular hypertrophy) 06/25/2015   Mild, noted on ECHO   . Mild sleep apnea    1/17 CPAP Dr Claiborne Billings does not use cpap   . Morbid obesity ([place]) 06/11/2015  . Osteoporosis   . Paget's disease   . Paget's disease of bone    Dr Trudie Reed, Reclast insufion 02/14/10 - bad reaction to infusion, now tolerates fosamax weekly  . Plantar fasciitis, bilateral   . PMB (postmenopausal bleeding)   . Pre-diabetes   . Sleep apnea   . Thrombocytopenia ([place])   . TR (tricuspid regurgitation) 06/25/2015   Trace, noted on ECHO   . Tubular  adenoma polyp of rectum    8/12, Dr Fuller Plan, 5 yr follow up   . Uterine fibroid 10/19/2017   noted on CT pelvis  . Vitamin D deficiency    Past Surgical History:  Procedure Laterality Date  . AUGMENTATION MAMMAPLASTY Bilateral   . BREAST ENHANCEMENT SURGERY    . BREAST REDUCTION SURGERY    . COLONOSCOPY  01/19/2016  . DILATION AND CURETTAGE OF UTERUS N/A 06/24/2015   Procedure: DILATATION AND CURETTAGE;  Surgeon: Emily Filbert, MD;  Location: [facility];  Service: Gynecology;  Laterality: N/A;  . KNEE ARTHROSCOPY WITH SUBCHONDROPLASTY Right 05/03/2018   Procedure: RIGHT KNEE ARTHROSCOPY WITH DEBRIDEMENT, PARTIAL MEDIAL MENISCECTOMY SUBCHONDROPLASTY MEDIAL TIBIAL PLATEAU AND MEDIAL FEMORAL CONDYLE;   Surgeon: Mcarthur Rossetti, MD;  Location: [facility];  Service: Orthopedics;  Laterality: Right;  . REDUCTION MAMMAPLASTY Bilateral   . TOE SURGERY     removal of bone in each foot  . TUBAL LIGATION     Allergies  Allergen Reactions  . Penicillins Rash and Other (See Comments)    Has patient had a PCN reaction causing immediate rash, facial/tongue/throat swelling, SOB or lightheadedness with hypotension: Yes Has patient had a PCN reaction causing severe rash involving mucus membranes or skin necrosis: No Has patient had a PCN reaction that required hospitalization: No Has patient had a PCN reaction occurring within the last 10 years: No If all of the above answers are "NO", then may proceed with Cephalosporin use.   [name] Reclast [Zoledronic Acid] Nausea And Vomiting   Prior to Admission medications   Medication Sig Start Date End Date Taking? Authorizing Provider  amLODipine (NORVASC) 5 MG tablet Take 1 tablet (5 mg total) by mouth daily. 04/26/18   McVey, Gelene Mink, PA-C  cetirizine (ZYRTEC) 10 MG tablet Take 10 mg by mouth daily.    [provider]  estradiol (ESTRACE) 0.1 MG/GM vaginal cream Apply 1g 1 to 3 times per week 04/26/18   McVey, Gelene Mink, PA-C  fluticasone [facility] 50 MCG/ACT nasal spray Place 2 sprays into both nostrils daily. Patient not taking: Reported on 04/26/2018 12/12/17   Dutch Quint B, FNP  HYDROcodone-acetaminophen (NORCO/VICODIN) 5-325 MG tablet Take 1-2 tablets by mouth every 6 (six) hours as needed for moderate pain. 05/09/18   Mcarthur Rossetti, MD  ibuprofen (ADVIL,MOTRIN) 800 MG tablet Take 1 tablet (800 mg total) by mouth every 8 (eight) hours as needed. 05/04/18   Mcarthur Rossetti, MD  oxyCODONE (ROXICODONE) 5 MG immediate release tablet Take 1-2 tablets (5-10 mg total) by mouth every 4 (four) hours as needed for severe pain. 05/03/18   Mcarthur Rossetti, MD  telmisartan (MICARDIS) 80 MG tablet Take 1 tablet (80  mg total) by mouth daily. 04/26/18   McVey, Gelene Mink, PA-C  tiZANidine (ZANAFLEX) 4 MG tablet Take 1 tablet (4 mg total) by mouth every 6 (six) hours as needed for muscle spasms. 05/04/18   Mcarthur Rossetti, MD  valACYclovir (VALTREX) 500 MG tablet Take 1 tablet (500 mg total) by mouth daily. 04/26/18   McVey, Gelene Mink, PA-C   Social History   Socioeconomic History  . Marital status: Single    Spouse name: Not on file  . Number of children: Not on file  . Years of education: Not on file  . Highest education level: Not on file  Occupational History  . Not on file  Social Needs  . Financial resource strain: Not on file  . Food insecurity:  Worry: Not on file    Inability: Not on file  . Transportation needs:    Medical: Not on file    Non-medical: Not on file  Tobacco Use  . Smoking status: Former Smoker    Years: 7.00    Types: Cigars    Last attempt to quit: 08/11/2008    Years since quitting: 9.7  . Smokeless tobacco: Never Used  . Tobacco comment: smoked [employer], 1 pack per day (5 in a pack)  Substance and Sexual Activity  . Alcohol use: Yes    Alcohol/week: 5.0 standard drinks    Types: 5 Cans of beer per week    Comment: occ   . Drug use: Never  . Sexual activity: Not Currently    Partners: Male    Birth control/protection: Surgical  Lifestyle  . Physical activity:    Days per week: Not on file    Minutes per session: Not on file  . Stress: Not on file  Relationships  . Social connections:    Talks on phone: Not on file    Gets together: Not on file    Attends religious service: Not on file    Active member of club or organization: Not on file    Attends meetings of clubs or organizations: Not on file    Relationship status: Not on file  . Intimate partner violence:    Fear of current or ex partner: Not on file    Emotionally abused: Not on file    Physically abused: Not on file    Forced sexual activity: Not on file  Other  Topics Concern  . Not on file  Social History Narrative   Epworth sleepiness score as of 06/11/15 a 4   Exercise: yes, active at work, no formal exercise   Single, never married, in relationship off and on   Children: Geiger, Bonham (both in [place]), 2 grandkids.  Raised 2 nieces [name], [address] (they have 5 kids)   Occupation: [employer]   Religion: faith important, not active in a church   Seatbelt use: yes   Review of Systems  Skin: Positive for wound (R great toenail).      Objective:   Physical Exam  Constitutional: She is oriented to person, place, and time. She appears well-developed and well-nourished. No distress.  HENT:  Head: Normocephalic and atraumatic.  Eyes: Conjunctivae and EOM are normal.  Neck: Neck supple. No tracheal deviation present.  Cardiovascular: Normal rate.  Pulmonary/Chest: Effort normal. No respiratory distress.  Musculoskeletal: Normal range of motion.  Neurological: She is alert and oriented to person, place, and time.  Skin: Skin is warm and dry.  Near complete avulsion of R great toenail with slight attachment to proximal nailfold laterally only.   Psychiatric: She has a normal mood and affect. Her behavior is normal.  Nursing note and vitals reviewed.   Vitals:   05/18/18 1635 05/18/18 1638  BP: (!) 142/89 (!) 142/86  Pulse: 86   Temp: 97.8 F (36.6 C)   TempSrc: Oral   SpO2: 99%   Weight: 299 lb 3.2 oz (135.7 kg)   Height: 5' 6.5" (1.689 m)       Assessment & Plan:   EDMUND RICK is a 57 y.o. female Avulsion of toenail of right foot  -Options for removal of remaining attached toenail discussed.  Consent obtained for removal.  Appeared to be minimally attached and decided on initial approach without digital block.  Proximal skin  was loosened with gentle pressure from forceps and small areas trimmed with iris scissors, then 11 blade without any discomfort.  Nail was freed without difficulty and underlying skin  without bleeding.  Aftercare discussed as well as RTC precautions.   No orders of the defined types were placed in this encounter.  Patient Instructions   Nail removed without difficulty.  Keep nailbed clean and covered. If any pain or new concerns as new nail grows in, return for recheck.   Thanks for coming in today.   Nail Avulsion Nail avulsion is when a nail tears away from the nail bed due to an accident or an injury. Nail avulsion can be painful. Your finger or toe may bleed a lot, and you may have some pain, redness, throbbing, and swelling while it heals. Your nail will grow back within several months. Once it grows back, it might not look the same. This may happen even after taking good care of it. Follow these instructions at home: Wound care   Clean any dirt and debris from the wound.  If you notice bleeding, press gently on the nailbed with a gauze pad. Do this for 15 minutes.  If a health care provider closed your wound with stitches (sutures), leave them in place. They may need to stay in place for 2 weeks or longer. You may need to see your health care provider to have them removed.  Keep the wound dry for 48 hours. After 48 hours have passed, lightly wash the finger or toe in warm, soapy water 2-3 times a day. This helps to reduce pain and swelling and prevent infection. Dressing Care  Cover the wound with a clean gauze bandage (dressing). You may be able to stop wearing a dressing after 2?7 days.  Wash your hands with soap and water before you change your dressing. If soap and water are not available, use hand sanitizer.  Change the dressing once or twice a day. Always change the dressing: ? If the dressing gets wet or dirty. ? After washing your finger or toe. Medicine  Take over-the-counter pain medicine as needed. Do not take aspirin or products containing aspirin unless directed by your health care provider. These products can increase bleeding.  If you were  prescribed an antibiotic medicine, take it or apply it as told by your health care provider. Do not stop taking or using the antibiotic even if you start to feel better. General instructions  Keep the hand or foot with the nail injury raised above the level of your heart as much as possible. This helps to reduce pain and swelling.  Move the toe or finger often to avoid stiffness.  Do not smoke. Smoking can delay healing. If you need help quitting, talk to your health care provider. Contact a health care provider if:  You have swelling or pain that gets worse instead of better.  You have fluid, blood, or pus coming from your wound.  Your wound smells bad. Get help right away if:  You have bleeding that does not stop, even when you apply pressure to the wound.  You have a temperature that is higher than 104F (40C).  You cannot move your fingers or toes.  The affected finger or toe looks white or black. This information is not intended to replace advice given to you by your health care provider. Make sure you discuss any questions you have with your health care provider. Document Released: 08/04/2004 Document Revised: 02/25/2016 Document Reviewed: 11/12/2014  Elsevier Interactive Patient Education  [employer].   If you have lab work done today you will be contacted with your lab results within the next 2 weeks.  If you have not heard from [place] then please contact us. The fastest way to get your results is to register for My Chart.   IF you received an x-ray today, you will receive an invoice from [facility]. Please contact [facility] at [phone] with questions or concerns regarding your invoice.   IF you received labwork today, you will receive an invoice from [facility]. Please contact [facility] at [phone] with questions or concerns regarding your invoice.   Our billing staff will not be able to assist you with questions regarding bills from these  companies.  You will be contacted with the lab results as soon as they are available. The fastest way to get your results is to activate your My Chart account. Instructions are located on the last page of this paperwork. If you have not heard from [place] regarding the results in 2 weeks, please contact this office.       I personally performed the services described in this documentation, which was scribed in my presence. The recorded information has been reviewed and considered for accuracy and completeness, addended by me as needed, and agree with information above.  Signed,   Merri Ray, MD Primary [facility] at [place].  05/20/18 10:31 AM

## 2018-05-20 ENCOUNTER — Encounter: Payer: Self-pay | Admitting: Family Medicine

## 2018-05-23 ENCOUNTER — Telehealth (INDEPENDENT_AMBULATORY_CARE_PROVIDER_SITE_OTHER): Payer: Self-pay | Admitting: Orthopaedic Surgery

## 2018-05-23 ENCOUNTER — Encounter: Payer: Self-pay | Admitting: Physical Therapy

## 2018-05-23 ENCOUNTER — Ambulatory Visit: Payer: 59 | Attending: Family Medicine | Admitting: Physical Therapy

## 2018-05-23 ENCOUNTER — Other Ambulatory Visit: Payer: Self-pay

## 2018-05-23 DIAGNOSIS — R262 Difficulty in walking, not elsewhere classified: Secondary | ICD-10-CM

## 2018-05-23 DIAGNOSIS — M6281 Muscle weakness (generalized): Secondary | ICD-10-CM

## 2018-05-23 DIAGNOSIS — M25561 Pain in right knee: Secondary | ICD-10-CM | POA: Diagnosis not present

## 2018-05-23 DIAGNOSIS — G8929 Other chronic pain: Secondary | ICD-10-CM

## 2018-05-23 DIAGNOSIS — M25562 Pain in left knee: Secondary | ICD-10-CM | POA: Insufficient documentation

## 2018-05-23 DIAGNOSIS — M25661 Stiffness of right knee, not elsewhere classified: Secondary | ICD-10-CM | POA: Diagnosis not present

## 2018-05-23 NOTE — Telephone Encounter (Signed)
See below, any suggestions? 

## 2018-05-23 NOTE — Telephone Encounter (Signed)
Patient aware of the below message and aware Dr. Ninfa Linden will keep her out longer

## 2018-05-23 NOTE — Therapy (Addendum)
[place], [place], [address] Phone: [phone]   Fax:  [phone]  Physical Therapy Evaluation  Patient Details  Name: Michelle Huerta MRN: [id number] Date of Birth: 1961-04-21 Referring Provider (PT): Santiago Bumpers    Progress Note Reporting Period 05/23/2018 to 07/04/2018   See note below for Objective Data and Assessment of Progress/Goals.        Encounter Date: 05/23/2018  PT End of Session - 05/23/18 1206    Visit Number  1    Number of Visits  12    Date for PT Re-Evaluation  07/04/18    Authorization Type  [facility]    PT Start Time  1145    PT Stop Time  1230    PT Time Calculation (min)  45 min    Activity Tolerance  Patient tolerated treatment well    Behavior During Therapy  [facility] for tasks assessed/performed       Past Medical History:  Diagnosis Date  . Allergy   . Bilateral chronic knee pain   . Chronic back pain   . Constipation   . DDD (degenerative disc disease), cervical   . Depression    no meds  . Genital herpes    HSV type 2 at rectum, culture positive 3/15  . Glaucoma   . Grade I diastolic dysfunction 50/53/9767   noted on ECHO   . Heart murmur   . History of colon polyps   . Hypertension   . Insomnia   . Internal hemorrhoids   . Lupus ([place])   . LVH (left ventricular hypertrophy) 06/25/2015   Mild, noted on ECHO   . Mild sleep apnea    1/17 CPAP Dr Claiborne Billings does not use cpap   . Morbid obesity ([place]) 06/11/2015  . Osteoporosis   . Paget's disease   . Paget's disease of bone    Dr Trudie Reed, Reclast insufion 02/14/10 - bad reaction to infusion, now tolerates fosamax weekly  . Plantar fasciitis, bilateral   . PMB (postmenopausal bleeding)   . Pre-diabetes   . Sleep apnea   . Thrombocytopenia ([place])   . TR (tricuspid regurgitation) 06/25/2015   Trace, noted on ECHO   . Tubular adenoma polyp of rectum    8/12, Dr Fuller Plan, 5 yr follow up   . Uterine  fibroid 10/19/2017   noted on CT pelvis  . Vitamin D deficiency     Past Surgical History:  Procedure Laterality Date  . AUGMENTATION MAMMAPLASTY Bilateral   . BREAST ENHANCEMENT SURGERY    . BREAST REDUCTION SURGERY    . COLONOSCOPY  01/19/2016  . DILATION AND CURETTAGE OF UTERUS N/A 06/24/2015   Procedure: DILATATION AND CURETTAGE;  Surgeon: Emily Filbert, MD;  Location: [facility];  Service: Gynecology;  Laterality: N/A;  . KNEE ARTHROSCOPY WITH SUBCHONDROPLASTY Right 05/03/2018   Procedure: RIGHT KNEE ARTHROSCOPY WITH DEBRIDEMENT, PARTIAL MEDIAL MENISCECTOMY SUBCHONDROPLASTY MEDIAL TIBIAL PLATEAU AND MEDIAL FEMORAL CONDYLE;  Surgeon: Mcarthur Rossetti, MD;  Location: [facility];  Service: Orthopedics;  Laterality: Right;  . REDUCTION MAMMAPLASTY Bilateral   . TOE SURGERY     removal of bone in each foot  . TUBAL LIGATION      There were no vitals filed for this visit.   Subjective Assessment - 05/23/18 1149    Subjective  Patient had right knee arthroscopy on 05/03/2018.  Doesn't think she will be able to go back to work at end of Novemeber 27th  secondary to pain with short distances and job requires alot of walking around hospital. Left knee has begin to hurt more now. Had some pain imprvement since surgery but still hurting pretty bad,especially when changing positions.  Has almost falled twice now as knee "gave" out on her.     Pertinent History  Paget's Disease, Discoid Lupus     Limitations  Sitting;Standing;Walking   In one positoin too long increases pain   How long can you sit comfortably?  30 minutes    changing out of positions increases pain    How long can you walk comfortably?  not walking too far right now before needs rest     Diagnostic tests  Xray showing Medial right knee space narrowing     Patient Stated Goals  Improve pain to get back to work     Currently in Pain?  Yes    Pain Score  2    4/10 when weight bearing or chaning positions   Pain Location  Knee     Pain Orientation  Right    Pain Descriptors / Indicators  Aching;Dull    Pain Type  Surgical pain    Pain Onset  1 to 4 weeks ago    Pain Frequency  Intermittent    Aggravating Factors   standing after sitting, walking, standing     Pain Relieving Factors  rest, staying off of it     Effect of Pain on Daily Activities  decreased mobility     Multiple Pain Sites  Yes    Pain Score  3   when weight bearing    Pain Location  Knee    Pain Orientation  Left    Pain Descriptors / Indicators  Aching;Dull    Pain Type  Chronic pain;Acute pain    Pain Onset  1 to 4 weeks ago    Pain Frequency  Constant    Aggravating Factors   weight bearing     Pain Relieving Factors  rest     Effect of Pain on Daily Activities  decreased mobility          [facility] PT Assessment - 05/23/18 0001      Assessment   Medical Diagnosis  Right knee arthroscopy     Referring Provider (PT)  Santiago Bumpers     Onset Date/Surgical Date  05/03/18    Next MD Visit  06/04/2018   but wanting to call and scheduele sooner    Prior Therapy  Yes   September 2019     Precautions   Precautions  None      Restrictions   Weight Bearing Restrictions  No      Balance Screen   Has the patient fallen in the past 6 months  No   almost fallen twice since surgery   Has the patient had a decrease in activity level because of a fear of falling?   No    Is the patient reluctant to leave their home because of a fear of falling?   No      Home Environment   Additional Comments  --   No steps into house      Prior Function   Level of Independence  Independent    Vocation  Full time employment   currrently not working while recovering    [employer], walking       Cognition   Overall Cognitive Status  Within Functional Limits for tasks assessed  Attention  Focused    Focused Attention  Appears intact    Memory  Appears intact    Awareness  Appears intact    Problem Solving  Appears  intact      Observation/Other Assessments-Edema    Edema  Circumferential      Circumferential Edema   Circumferential - Right  58cm    Circumferential - Left   56cm      Sensation   Light Touch  Appears Intact    Stereognosis  Appears Intact    Hot/Cold  Appears Intact    Proprioception  Appears Intact    Additional Comments  Deniesa paresthesias      Coordination   Gross Motor Movements are Fluid and Coordinated  Yes    Fine Motor Movements are Fluid and Coordinated  Yes      Posture/Postural Control   Posture/Postural Control  Postural limitations    Postural Limitations  Rounded Shoulders;Forward head      ROM / Strength   AROM / PROM / Strength  AROM;PROM;Strength      AROM   Right Knee Extension  -4    Right Knee Flexion  101    Left Knee Extension  0    Left Knee Flexion  112      PROM   PROM Assessment Site  Knee;Hip    Right/Left Hip  Right    Right/Left Knee  Right;Left    Right Knee Extension  -3    Right Knee Flexion  103    Left Knee Extension  0    Left Knee Flexion  116      Strength   Right Hip Flexion  4/5    Right Hip ABduction  4-/5    Left Hip Flexion  4+/5    Left Hip ABduction  4/5    Right Knee Flexion  4-/5    Right Knee Extension  4-/5    Left Knee Flexion  5/5    Left Knee Extension  5/5      Palpation   Palpation comment  right patella with decreased mobility       Ambulation/Gait   Gait Comments  slow gait speed for age, wide BOS ; genu valgus Bil; decreased stance time on RLE                 Objective measurements completed on examination: See above findings.      [facility] Adult PT Treatment/Exercise - 05/23/18 0001      Knee/Hip Exercises: Stretches   Active Hamstring Stretch  2 reps;20 seconds   with posterior force proximal knee    Quad Stretch Limitations  seated with LLE pushing RLE into flexion    2x20sec     Knee/Hip Exercises: Supine   Short Arc Quad Sets  10 reps    Straight Leg Raises  10 reps;Right              PT Education - 05/23/18 1255    Education Details  New HEP; Ice and elevation for swelling; POC    Person(s) Educated  Patient    Methods  Explanation;Demonstration;Tactile cues;Verbal cues;Handout    Comprehension  Verbalized understanding;Returned demonstration;Verbal cues required;Tactile cues required       PT Short Term Goals - 05/23/18 1308      PT SHORT TERM GOAL #1   Title  Pt will improve R knee flexion by 10 degrees     Time  3    Period  Weeks    Status  New    Target Date  06/13/18      PT SHORT TERM GOAL #2   Title  Pt will improve R knee flexor and extensor strength to 4+/5     Time  3    Period  Weeks    Status  New    Target Date  06/13/18      PT SHORT TERM GOAL #3   Title  Pt will report <2/10 pain with sit<>Stands     Time  3    Period  Weeks    Status  New    Target Date  06/13/18        PT Long Term Goals - 05/23/18 1309      PT LONG TERM GOAL #1   Title  Pt will be able to sit or stand >1 hour without increased knee pain in order to perform ADLs     Time  6    Period  Weeks    Status  New    Target Date  07/04/18      PT LONG TERM GOAL #2   Title  Pt will improve R knee strength to 5/5 in order to improve gait and mobility to return to work.     Time  6    Period  Weeks    Status  New    Target Date  07/04/18      PT LONG TERM GOAL #3   Title  Pt will decreased right knee edema by 2cm in order to improve overall knee mobility     Time  6    Period  Weeks    Status  New    Target Date  07/04/18             Plan - 05/23/18 1257    Clinical Impression Statement  Patient is a 57 y.o. female presenting s/p right knee arthroscopy with partial medial menisectomy and subchondroplasty to tibal plateau and femoral condyle. Patient with right knee edema and decreased R knee ROM  And strength that effects her most when weight bearing or changing positions. Also presenting with left knee pain and ROM limitations as  compared to previous measurements from her therapy in August. Patient will benefit from skilled PT in order to improve ROM and strength in Bil knees.      History and Personal Factors relevant to plan of care:  Lupus, Pagets disease     Clinical Presentation  Stable    Clinical Presentation due to:  R knee arthroscopy on 05/03/18    Clinical Decision Making  Low    Rehab Potential  Excellent    PT Frequency  2x / week    PT Duration  6 weeks    PT Treatment/Interventions  ADLs/Self Care Home Management;Cryotherapy;Ultrasound;Moist Heat;Iontophoresis 4mg /ml Dexamethasone;Electrical Stimulation;DME Instruction;Gait training;Stair training;Balance training;Therapeutic exercise;Therapeutic activities;Functional mobility training;Neuromuscular re-education;Patient/family education;Manual techniques;Passive range of motion;Taping;Vasopneumatic Device;Scar mobilization;Joint Manipulations    PT Next Visit Plan  HEP review ,retrograde massage for edema, assess 5x sit to stand, Iastm to lateral quads if needed, patellar mobility,  hamstring stretch, calf stretch, heel slides, SAQ, quad sets, hamstring curls, clamshells ,    PT Home Exercise Plan  seated hamstring stretch, knee flexion stretch, quad sets, SAQ, and SLR     Consulted and Agree with Plan of Care  Patient       Patient will benefit from skilled therapeutic intervention in order to improve the following deficits and  impairments:  Decreased balance, Decreased endurance, Decreased mobility, Difficulty walking, Hypomobility, Pain, Postural dysfunction, Impaired flexibility, Increased fascial restricitons, Decreased strength, Obesity, Decreased range of motion, Abnormal gait  Visit Diagnosis: Acute pain of right knee  Chronic pain of left knee  Stiffness of right knee, not elsewhere classified  Muscle weakness (generalized)  Difficulty in walking, not elsewhere classified     Problem List Patient Active Problem List   Diagnosis Date  Noted  . Status post arthroscopic surgery of right knee 05/09/2018  . Chronic pain of right knee 05/09/2018  . Closed fracture of right tibial plateau   . Unilateral primary osteoarthritis, right knee 04/23/2018  . Acute medial meniscus tear of right knee 04/23/2018  . Closed fracture of medial plateau of left tibia 04/23/2018  . Severe obesity (BMI >= 40) ([place]) 04/23/2018  . Osteoarthritis of right knee 03/09/2018  . Prediabetes 03/09/2018  . Bilateral knee pain 12/28/2017  . Constipation 12/18/2017  . Cough 12/15/2017  . Lower resp. tract infection 12/15/2017  . Acute conjunctivitis of right eye 12/15/2017  . H/O Paget's disease of bone 09/10/2017  . OSA on CPAP 10/28/2015  . Fibroids 08/07/2015  . Plantar fasciitis, bilateral 08/03/2015  . Metatarsal deformity 08/03/2015  . Equinus deformity of foot, acquired 08/03/2015  . Pronation deformity of both feet 08/03/2015  . Morbid obesity ([place]) 06/11/2015  . 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 Essential (primary) hypertension 06/11/2015  . 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 Benign essential HTN 09/19/2013  . Major depressive episode 09/19/2013   Carolyne Littles PT DPT  05/23/2018    Einar Crow SPT 05/23/2018, 1:21 PM   During this treatment session, the therapist was present, participating in and directing the treatment.   [address] [place], [place], [address] Phone: [phone]   Fax:  [phone]  Name: Michelle Huerta MRN: [id number] Date of Birth: August 03, 1960

## 2018-05-23 NOTE — Telephone Encounter (Signed)
Given the amount of arthritis in her knee she still cannot have pain.  She needs to allow the sub-chondroplasty to hopefully work.  However, she will still likely need a knee replacement at some point.  Unfortunately her BMI is to the extent of may not allow [place] to provide that surgery since her BMI is 47.  Use it only does do surgery unless symptoms BMI is 40 or below.  Certainly there is exceptions to this.  No other thing we can do is have her continue to stay out of work until she feels better.

## 2018-05-23 NOTE — Telephone Encounter (Signed)
Please call patient to discuss her care she is still having pain in her right knee. 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 Ms.Joud is currently completing Pt.

## 2018-05-28 ENCOUNTER — Ambulatory Visit (INDEPENDENT_AMBULATORY_CARE_PROVIDER_SITE_OTHER): Payer: 59 | Admitting: Orthopaedic Surgery

## 2018-05-28 ENCOUNTER — Encounter (INDEPENDENT_AMBULATORY_CARE_PROVIDER_SITE_OTHER): Payer: Self-pay | Admitting: Orthopaedic Surgery

## 2018-05-28 DIAGNOSIS — Z9889 Other specified postprocedural states: Secondary | ICD-10-CM

## 2018-05-28 DIAGNOSIS — M1711 Unilateral primary osteoarthritis, right knee: Secondary | ICD-10-CM

## 2018-05-28 NOTE — Progress Notes (Signed)
The patient is a 57 year old who is continuing to follow-up after having a right knee arthroscopy with sub-chondroplasty of the medial femoral condyle and the medial tibial plateau secondary to arthritic changes.  She is someone who is morbidly obese with a BMI of 47.6.  She is now in follow-up with the [facility] working on weight loss.  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  She is having problems with continued right knee pain and inability to stand or walk for long periods of time.  We have had her out of work and she feels like she needs to be out of work even longer.  On examination of her right knee she does have medial joint line tenderness to be expected and painful range of motion.  She has only a mild effusion.  The knee feels stable otherwise.  We will continue to keep her out of work since she cannot perform her job at the moment due to continued right knee pain.  It is difficult to say how long this will last but we will at least keep her out until the first of the year to allow her to hopefully recover more from her right knee surgery.  I do feel that she is a candidate for hyaluronic acid so we will order this for her right knee and place it in 4 weeks from now.  She understands the rationale behind injection such as this and I gave her handout about it as well.  We will see her back in 4 weeks to hopefully place hyaluronic acid into the right knee.

## 2018-05-29 ENCOUNTER — Ambulatory Visit: Payer: 59 | Admitting: Physical Therapy

## 2018-05-29 ENCOUNTER — Telehealth (INDEPENDENT_AMBULATORY_CARE_PROVIDER_SITE_OTHER): Payer: Self-pay

## 2018-05-29 NOTE — Telephone Encounter (Signed)
Right knee gel injection  

## 2018-05-30 ENCOUNTER — Encounter (INDEPENDENT_AMBULATORY_CARE_PROVIDER_SITE_OTHER): Payer: Self-pay | Admitting: Family Medicine

## 2018-05-30 ENCOUNTER — Ambulatory Visit (INDEPENDENT_AMBULATORY_CARE_PROVIDER_SITE_OTHER): Payer: 59 | Admitting: Family Medicine

## 2018-05-30 ENCOUNTER — Other Ambulatory Visit: Payer: Self-pay | Admitting: Physician Assistant

## 2018-05-30 VITALS — BP 137/88 | HR 71 | Ht 67.0 in | Wt 296.0 lb

## 2018-05-30 DIAGNOSIS — R5383 Other fatigue: Secondary | ICD-10-CM

## 2018-05-30 DIAGNOSIS — Z9189 Other specified personal risk factors, not elsewhere classified: Secondary | ICD-10-CM | POA: Diagnosis not present

## 2018-05-30 DIAGNOSIS — Z1331 Encounter for screening for depression: Secondary | ICD-10-CM

## 2018-05-30 DIAGNOSIS — R0602 Shortness of breath: Secondary | ICD-10-CM | POA: Diagnosis not present

## 2018-05-30 DIAGNOSIS — Z0289 Encounter for other administrative examinations: Secondary | ICD-10-CM

## 2018-05-30 DIAGNOSIS — Z6841 Body Mass Index (BMI) 40.0 and over, adult: Secondary | ICD-10-CM | POA: Diagnosis not present

## 2018-05-30 DIAGNOSIS — R739 Hyperglycemia, unspecified: Secondary | ICD-10-CM | POA: Diagnosis not present

## 2018-05-31 ENCOUNTER — Telehealth (INDEPENDENT_AMBULATORY_CARE_PROVIDER_SITE_OTHER): Payer: Self-pay

## 2018-05-31 ENCOUNTER — Encounter: Payer: Self-pay | Admitting: Physical Therapy

## 2018-05-31 ENCOUNTER — Ambulatory Visit: Payer: 59 | Admitting: Physical Therapy

## 2018-05-31 DIAGNOSIS — M25661 Stiffness of right knee, not elsewhere classified: Secondary | ICD-10-CM | POA: Diagnosis not present

## 2018-05-31 DIAGNOSIS — M25561 Pain in right knee: Secondary | ICD-10-CM

## 2018-05-31 DIAGNOSIS — R262 Difficulty in walking, not elsewhere classified: Secondary | ICD-10-CM | POA: Diagnosis not present

## 2018-05-31 DIAGNOSIS — G8929 Other chronic pain: Secondary | ICD-10-CM | POA: Diagnosis not present

## 2018-05-31 DIAGNOSIS — M25562 Pain in left knee: Secondary | ICD-10-CM | POA: Diagnosis not present

## 2018-05-31 DIAGNOSIS — M6281 Muscle weakness (generalized): Secondary | ICD-10-CM | POA: Diagnosis not present

## 2018-05-31 LAB — CBC WITH DIFFERENTIAL/PLATELET
BASOS ABS: 0 10*3/uL (ref 0.0–0.2)
Basos: 0 %
EOS (ABSOLUTE): 0.1 10*3/uL (ref 0.0–0.4)
Eos: 1 %
HEMOGLOBIN: 11.6 g/dL (ref 11.1–15.9)
Hematocrit: 37 % (ref 34.0–46.6)
Immature Grans (Abs): 0 10*3/uL (ref 0.0–0.1)
Immature Granulocytes: 1 %
LYMPHS: 38 %
Lymphocytes Absolute: 2.4 10*3/uL (ref 0.7–3.1)
MCH: 25.4 pg — ABNORMAL LOW (ref 26.6–33.0)
MCHC: 31.4 g/dL — AB (ref 31.5–35.7)
MCV: 81 fL (ref 79–97)
MONOCYTES: 6 %
Monocytes Absolute: 0.4 10*3/uL (ref 0.1–0.9)
Neutrophils Absolute: 3.4 10*3/uL (ref 1.4–7.0)
Neutrophils: 54 %
PLATELETS: 173 10*3/uL (ref 150–450)
RBC: 4.57 x10E6/uL (ref 3.77–5.28)
RDW: 13.7 % (ref 12.3–15.4)
WBC: 6.4 10*3/uL (ref 3.4–10.8)

## 2018-05-31 LAB — COMPREHENSIVE METABOLIC PANEL
ALBUMIN: 3.9 g/dL (ref 3.5–5.5)
ALT: 40 IU/L — ABNORMAL HIGH (ref 0–32)
AST: 29 IU/L (ref 0–40)
Albumin/Globulin Ratio: 1 — ABNORMAL LOW (ref 1.2–2.2)
Alkaline Phosphatase: 91 IU/L (ref 39–117)
BUN / CREAT RATIO: 18 (ref 9–23)
BUN: 13 mg/dL (ref 6–24)
Bilirubin Total: 0.5 mg/dL (ref 0.0–1.2)
CALCIUM: 9.2 mg/dL (ref 8.7–10.2)
CO2: 23 mmol/L (ref 20–29)
CREATININE: 0.73 mg/dL (ref 0.57–1.00)
Chloride: 101 mmol/L (ref 96–106)
GFR, EST AFRICAN AMERICAN: 107 mL/min/{1.73_m2} (ref >59)
GFR, EST NON AFRICAN AMERICAN: 93 mL/min/{1.73_m2} (ref >59)
GLUCOSE: 86 mg/dL (ref 65–99)
Globulin, Total: 3.9 g/dL (ref 1.5–4.5)
Potassium: 4.2 mmol/L (ref 3.5–5.2)
Sodium: 139 mmol/L (ref 134–144)
Total Protein: 7.8 g/dL (ref 6.0–8.5)

## 2018-05-31 LAB — LIPID PANEL
CHOLESTEROL TOTAL: 134 mg/dL (ref 100–199)
Chol/HDL Ratio: 1.8 ratio (ref 0.0–4.4)
HDL: 75 mg/dL (ref >39)
LDL Calculated: 42 mg/dL (ref 0–99)
Triglycerides: 85 mg/dL (ref 0–149)
VLDL CHOLESTEROL CAL: 17 mg/dL (ref 5–40)

## 2018-05-31 LAB — HEMOGLOBIN A1C
Est. average glucose Bld gHb Est-mCnc: 134 mg/dL
Hgb A1c MFr Bld: 6.3 % — ABNORMAL HIGH (ref 4.8–5.6)

## 2018-05-31 LAB — FOLATE: FOLATE: 4.1 ng/mL (ref >3.0)

## 2018-05-31 LAB — VITAMIN D 25 HYDROXY (VIT D DEFICIENCY, FRACTURES): VIT D 25 HYDROXY: 19.7 ng/mL — AB (ref 30.0–100.0)

## 2018-05-31 LAB — INSULIN, RANDOM: INSULIN: 41.4 u[IU]/mL — AB (ref 2.6–24.9)

## 2018-05-31 LAB — T4, FREE: Free T4: 1.24 ng/dL (ref 0.82–1.77)

## 2018-05-31 LAB — TSH: TSH: 1.28 u[IU]/mL (ref 0.450–4.500)

## 2018-05-31 LAB — T3: T3 TOTAL: 139 ng/dL (ref 71–180)

## 2018-05-31 LAB — VITAMIN B12: Vitamin B-12: 449 pg/mL (ref 232–1245)

## 2018-05-31 NOTE — Telephone Encounter (Signed)
Called and left a VM advising patient that she is approved to have gel injection for her right knee and to give the office a call to schedule.  Patient is approved for Monovisc, right knee. Buy & Bill Covered at 100% through her insurance Co-pay of $50.00 No PA required

## 2018-05-31 NOTE — Telephone Encounter (Signed)
Noted  

## 2018-05-31 NOTE — Therapy (Signed)
[place], [place], [address] Phone: [phone]   Fax:  [phone]  Physical Therapy Treatment  Patient Details  Name: Michelle Huerta MRN: [id number] Date of Birth: May 25, 1961 Referring Provider (PT): Santiago Bumpers    Encounter Date: 05/31/2018  PT End of Session - 05/31/18 0807    Visit Number  2    Number of Visits  12    Date for PT Re-Evaluation  07/04/18    Authorization Type  [facility]    PT Start Time  0805    PT Stop Time  0844    PT Time Calculation (min)  39 min    Activity Tolerance  Patient tolerated treatment well    Behavior During Therapy  [facility] for tasks assessed/performed       Past Medical History:  Diagnosis Date  . Allergy   . Arthritis of both knees   . Bilateral chronic knee pain   . Chronic back pain   . Constipation   . DDD (degenerative disc disease), cervical   . Depression    no meds  . Genital herpes    HSV type 2 at rectum, culture positive 3/15  . Glaucoma   . Grade I diastolic dysfunction 60/63/0160   noted on ECHO   . Heart murmur   . History of colon polyps   . HLD (hyperlipidemia)   . Hypertension   . Hypothyroidism   . Insomnia   . Internal hemorrhoids   . Lactose intolerance   . Lupus ([place])   . LVH (left ventricular hypertrophy) 06/25/2015   Mild, noted on ECHO   . Mild sleep apnea    1/17 CPAP Dr Claiborne Billings does not use cpap   . Morbid obesity ([place]) 06/11/2015  . Osteoporosis   . Paget's disease   . Paget's disease of bone    Dr Trudie Reed, Reclast insufion 02/14/10 - bad reaction to infusion, now tolerates fosamax weekly  . Plantar fasciitis, bilateral   . PMB (postmenopausal bleeding)   . Pre-diabetes   . Sleep apnea   . Thrombocytopenia ([place])   . TR (tricuspid regurgitation) 06/25/2015   Trace, noted on ECHO   . Tubular adenoma polyp of rectum    8/12, Dr Fuller Plan, 5 yr follow up   . Uterine fibroid 10/19/2017   noted on CT pelvis  .  Vitamin D deficiency     Past Surgical History:  Procedure Laterality Date  . AUGMENTATION MAMMAPLASTY Bilateral   . BREAST ENHANCEMENT SURGERY    . BREAST REDUCTION SURGERY    . COLONOSCOPY  01/19/2016  . DILATION AND CURETTAGE OF UTERUS N/A 06/24/2015   Procedure: DILATATION AND CURETTAGE;  Surgeon: Emily Filbert, MD;  Location: [facility];  Service: Gynecology;  Laterality: N/A;  . KNEE ARTHROSCOPY WITH SUBCHONDROPLASTY Right 05/03/2018   Procedure: RIGHT KNEE ARTHROSCOPY WITH DEBRIDEMENT, PARTIAL MEDIAL MENISCECTOMY SUBCHONDROPLASTY MEDIAL TIBIAL PLATEAU AND MEDIAL FEMORAL CONDYLE;  Surgeon: Mcarthur Rossetti, MD;  Location: [facility];  Service: Orthopedics;  Laterality: Right;  . REDUCTION MAMMAPLASTY Bilateral   . TOE SURGERY     removal of bone in each foot  . TUBAL LIGATION      There were no vitals filed for this visit.  Subjective Assessment - 05/31/18 0808    Subjective  Still sore after sitting for a period of time. walking is easier    Patient Stated Goals  Improve pain to get back to work  Currently in Pain?  Yes    Pain Score  --   when I sit it's fine   Pain Location  Knee    Pain Orientation  Right    Pain Descriptors / Indicators  Sore                       [facility] Adult PT Treatment/Exercise - 05/31/18 0001      Exercises   Exercises  Knee/Hip      Knee/Hip Exercises: Stretches   Passive Hamstring Stretch  Both;2 reps;30 seconds    Quad Stretch Limitations  seated with strap      Knee/Hip Exercises: Supine   Bridges with [employer]  15 reps    Straight Leg Raises  Right;10 reps      Knee/Hip Exercises: Sidelying   Clams  x20 ball bw feet, x20 rev ball bw knees      Knee/Hip Exercises: Prone   Hip Extension  Right;15 reps    Hip Extension Limitations  cues req'd for glut contraction    Other Prone Exercises  prone glut set               PT Short Term Goals - 05/23/18 1308      PT SHORT TERM GOAL #1   Title  Pt will  improve R knee flexion by 10 degrees     Time  3    Period  Weeks    Status  New    Target Date  06/13/18      PT SHORT TERM GOAL #2   Title  Pt will improve R knee flexor and extensor strength to 4+/5     Time  3    Period  Weeks    Status  New    Target Date  06/13/18      PT SHORT TERM GOAL #3   Title  Pt will report <2/10 pain with sit<>Stands     Time  3    Period  Weeks    Status  New    Target Date  06/13/18        PT Long Term Goals - 05/23/18 1309      PT LONG TERM GOAL #1   Title  Pt will be able to sit or stand >1 hour without increased knee pain in order to perform ADLs     Time  6    Period  Weeks    Status  New    Target Date  07/04/18      PT LONG TERM GOAL #2   Title  Pt will improve R knee strength to 5/5 in order to improve gait and mobility to return to work.     Time  6    Period  Weeks    Status  New    Target Date  07/04/18      PT LONG TERM GOAL #3   Title  Pt will decreased right knee edema by 2cm in order to improve overall knee mobility     Time  6    Period  Weeks    Status  New    Target Date  07/04/18            Plan - 05/31/18 0840    Clinical Impression Statement  Significant difficulty utilizing glut contraction for LE control, multiple cues utilized today- bridges more effective for glut strength today but discussed importance for walking support. Limited sup/inf patellar mobiility. Did  not test 5TSTS due to inability to stand without use of UE and significant pain.     PT Treatment/Interventions  ADLs/Self Care Home Management;Cryotherapy;Ultrasound;Moist Heat;Iontophoresis 4mg /ml Dexamethasone;Electrical Stimulation;DME Instruction;Gait training;Stair training;Balance training;Therapeutic exercise;Therapeutic activities;Functional mobility training;Neuromuscular re-education;Patient/family education;Manual techniques;Passive range of motion;Taping;Vasopneumatic Device;Scar mobilization;Joint Manipulations    PT Next Visit Plan   hamstring strength, glut contraction    PT Home Exercise Plan  seated hamstring stretch, knee flexion stretch, quad sets, SAQ, and SLR, bridge    Consulted and Agree with Plan of Care  Patient       Patient will benefit from skilled therapeutic intervention in order to improve the following deficits and impairments:  Decreased balance, Decreased endurance, Decreased mobility, Difficulty walking, Hypomobility, Pain, Postural dysfunction, Impaired flexibility, Increased fascial restricitons, Decreased strength, Obesity, Decreased range of motion, Abnormal gait  Visit Diagnosis: Acute pain of right knee  Stiffness of right knee, not elsewhere classified  Muscle weakness (generalized)  Difficulty in walking, not elsewhere classified     Problem List Patient Active Problem List   Diagnosis Date Noted  . Status post arthroscopic surgery of right knee 05/09/2018  . Chronic pain of right knee 05/09/2018  . Closed fracture of right tibial plateau   . Unilateral primary osteoarthritis, right knee 04/23/2018  . Acute medial meniscus tear of right knee 04/23/2018  . Closed fracture of medial plateau of left tibia 04/23/2018  . Severe obesity (BMI >= 40) ([place]) 04/23/2018  . Osteoarthritis of right knee 03/09/2018  . Prediabetes 03/09/2018  . Bilateral knee pain 12/28/2017  . Constipation 12/18/2017  . Cough 12/15/2017  . Lower resp. tract infection 12/15/2017  . Acute conjunctivitis of right eye 12/15/2017  . H/O Paget's disease of bone 09/10/2017  . OSA on CPAP 10/28/2015  . Fibroids 08/07/2015  . Plantar fasciitis, bilateral 08/03/2015  . Metatarsal deformity 08/03/2015  . Equinus deformity of foot, acquired 08/03/2015  . Pronation deformity of both feet 08/03/2015  . Morbid obesity ([place]) 06/11/2015  . 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 Essential (primary) hypertension 06/11/2015  . 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 Benign essential HTN 09/19/2013  . Major depressive  episode 09/19/2013    Laurette Villescas C. Bonne Whack PT, DPT 05/31/18 8:45 AM   [facility] [address] [place], [place], [address] Phone: [phone]   Fax:  [phone]  Name: Michelle Huerta MRN: [id number] Date of Birth: 1960-08-23

## 2018-06-01 ENCOUNTER — Other Ambulatory Visit: Payer: Self-pay | Admitting: Physician Assistant

## 2018-06-04 ENCOUNTER — Ambulatory Visit (INDEPENDENT_AMBULATORY_CARE_PROVIDER_SITE_OTHER): Payer: 59 | Admitting: Orthopaedic Surgery

## 2018-06-04 ENCOUNTER — Encounter: Payer: Self-pay | Admitting: Physician Assistant

## 2018-06-04 ENCOUNTER — Other Ambulatory Visit: Payer: Self-pay

## 2018-06-04 ENCOUNTER — Ambulatory Visit: Payer: 59 | Admitting: Physician Assistant

## 2018-06-04 ENCOUNTER — Ambulatory Visit: Payer: 59 | Admitting: Physical Therapy

## 2018-06-04 ENCOUNTER — Encounter: Payer: Self-pay | Admitting: Physical Therapy

## 2018-06-04 VITALS — BP 138/82 | HR 86 | Temp 98.2°F | Resp 20 | Ht 66.4 in | Wt 299.0 lb

## 2018-06-04 DIAGNOSIS — M25561 Pain in right knee: Secondary | ICD-10-CM | POA: Diagnosis not present

## 2018-06-04 DIAGNOSIS — M25661 Stiffness of right knee, not elsewhere classified: Secondary | ICD-10-CM

## 2018-06-04 DIAGNOSIS — M6281 Muscle weakness (generalized): Secondary | ICD-10-CM

## 2018-06-04 DIAGNOSIS — R262 Difficulty in walking, not elsewhere classified: Secondary | ICD-10-CM | POA: Diagnosis not present

## 2018-06-04 DIAGNOSIS — G4700 Insomnia, unspecified: Secondary | ICD-10-CM

## 2018-06-04 DIAGNOSIS — G8929 Other chronic pain: Secondary | ICD-10-CM | POA: Diagnosis not present

## 2018-06-04 DIAGNOSIS — M25562 Pain in left knee: Secondary | ICD-10-CM | POA: Diagnosis not present

## 2018-06-04 MED ORDER — HYDROXYZINE HCL 50 MG PO TABS: 50.0000 mg | ORAL_TABLET | Freq: Three times a day (TID) | ORAL | 1 refills | Status: DC | PRN

## 2018-06-04 MED ORDER — ZOLPIDEM TARTRATE 5 MG PO TABS: 5.0000 mg | ORAL_TABLET | Freq: Every evening | ORAL | 1 refills | Status: DC | PRN

## 2018-06-04 MED ORDER — TRAZODONE HCL 100 MG PO TABS: 100.0000 mg | ORAL_TABLET | Freq: Every evening | ORAL | 1 refills | Status: DC | PRN

## 2018-06-04 MED FILL — hydrOXYzine HCL 50 MG TABS: 50 | 15 days supply | Qty: 90 | Fill #0

## 2018-06-04 MED FILL — traZODone HCL 100 MG TABS: 100 | 30 days supply | Qty: 90 | Fill #0

## 2018-06-04 NOTE — Therapy (Signed)
[place], [place], [address] Phone: [phone]   Fax:  [phone]  Physical Therapy Treatment  Patient Details  Name: Michelle Huerta MRN: [id number] Date of Birth: 1961-04-13 Referring Provider (PT): Santiago Bumpers    Encounter Date: 06/04/2018  PT End of Session - 06/04/18 1240    Visit Number  3    Number of Visits  12    Date for PT Re-Evaluation  07/04/18    Authorization Type  [employer]    PT Start Time  1230    PT Stop Time  1315    PT Time Calculation (min)  45 min    Activity Tolerance  Patient tolerated treatment well    Behavior During Therapy  [facility] for tasks assessed/performed       Past Medical History:  Diagnosis Date  . Allergy   . Arthritis of both knees   . Bilateral chronic knee pain   . Chronic back pain   . Constipation   . DDD (degenerative disc disease), cervical   . Depression    no meds  . Genital herpes    HSV type 2 at rectum, culture positive 3/15  . Glaucoma   . Grade I diastolic dysfunction 30/01/6225   noted on ECHO   . Heart murmur   . History of colon polyps   . HLD (hyperlipidemia)   . Hypertension   . Hypothyroidism   . Insomnia   . Internal hemorrhoids   . Lactose intolerance   . Lupus ([place])   . LVH (left ventricular hypertrophy) 06/25/2015   Mild, noted on ECHO   . Mild sleep apnea    1/17 CPAP Dr Claiborne Billings does not use cpap   . Morbid obesity ([place]) 06/11/2015  . Osteoporosis   . Paget's disease   . Paget's disease of bone    Dr Trudie Reed, Reclast insufion 02/14/10 - bad reaction to infusion, now tolerates fosamax weekly  . Plantar fasciitis, bilateral   . PMB (postmenopausal bleeding)   . Pre-diabetes   . Sleep apnea   . Thrombocytopenia ([place])   . TR (tricuspid regurgitation) 06/25/2015   Trace, noted on ECHO   . Tubular adenoma polyp of rectum    8/12, Dr Fuller Plan, 5 yr follow up   . Uterine fibroid 10/19/2017   noted on CT pelvis  .  Vitamin D deficiency     Past Surgical History:  Procedure Laterality Date  . AUGMENTATION MAMMAPLASTY Bilateral   . BREAST ENHANCEMENT SURGERY    . BREAST REDUCTION SURGERY    . COLONOSCOPY  01/19/2016  . DILATION AND CURETTAGE OF UTERUS N/A 06/24/2015   Procedure: DILATATION AND CURETTAGE;  Surgeon: Emily Filbert, MD;  Location: [facility];  Service: Gynecology;  Laterality: N/A;  . KNEE ARTHROSCOPY WITH SUBCHONDROPLASTY Right 05/03/2018   Procedure: RIGHT KNEE ARTHROSCOPY WITH DEBRIDEMENT, PARTIAL MEDIAL MENISCECTOMY SUBCHONDROPLASTY MEDIAL TIBIAL PLATEAU AND MEDIAL FEMORAL CONDYLE;  Surgeon: Mcarthur Rossetti, MD;  Location: [facility];  Service: Orthopedics;  Laterality: Right;  . REDUCTION MAMMAPLASTY Bilateral   . TOE SURGERY     removal of bone in each foot  . TUBAL LIGATION      There were no vitals filed for this visit.  Subjective Assessment - 06/04/18 1237    Subjective  Pt arriving to therapy reporting 2/10 pain at rest in R knee. Pt reporting pain of 5/10 with walking. Pt reports she hasn't been doing her exericses at home, but  knows she needs to.     Pertinent History  Paget's Disease, Discoid Lupus     Limitations  Sitting;Standing;Walking    How long can you sit comfortably?  30 minutes     How long can you walk comfortably?  not walking too far right now before needs rest     Diagnostic tests  Xray showing Medial right knee space narrowing     Patient Stated Goals  Improve pain to get back to work     Currently in Pain?  Yes    Pain Score  2     Pain Orientation  Right    Pain Descriptors / Indicators  Aching;Sore    Pain Type  Surgical pain    Pain Onset  1 to 4 weeks ago         [facility] PT Assessment - 06/04/18 0001      Assessment   Medical Diagnosis  Right knee arthroscopy     Referring Provider (PT)  Santiago Bumpers     Onset Date/Surgical Date  05/03/18    Next MD Visit  06/04/2018   but wanting to call and scheduele sooner    Prior Therapy  Yes    September 2019     Precautions   Precautions  None      Balance Screen   Has the patient fallen in the past 6 months  No   reporting episodes of R knee feeling likes it buckeling     AROM   Right Knee Extension  0    Right Knee Flexion  110      PROM   Right Knee Extension  --    Right Knee Flexion  --      Palpation   Palpation comment  right patella with decreased mobility       Transfers   Five time sit to stand comments   45.01 seconds using UE support                   [facility] Adult PT Treatment/Exercise - 06/04/18 0001      Exercises   Exercises  Knee/Hip      Knee/Hip Exercises: Stretches   Passive Hamstring Stretch  Right;3 reps;30 seconds    Quad Stretch Limitations  seated with strap      Knee/Hip Exercises: Supine   Short Arc [employer]  Strengthening;15 reps    Short Arc Quad Sets Limitations  holding 3 seconds each    Bridges with [employer]  15 reps    Straight Leg Raises  Right;2 sets;10 reps    Patellar Mobs  superior/inferior, medial/lateral    mild limitation in mobility   Other Supine Knee/Hip Exercises  glut sets x 10 holding 4 seconds each    Other Supine Knee/Hip Exercises  hamstring sets x 15 holding 5 seconds      Knee/Hip Exercises: Sidelying   Clams  x20 ball bw feet, x20 rev ball bw knees      Knee/Hip Exercises: Prone   Hip Extension  Right;15 reps             PT Education - 06/04/18 1321    Education Details  Pt instructed to use assistive device to help with balance and safety when amb on community surfaces due to sensations of her R knee feeling like it's going to buckle per pt report.     Person(s) Educated  Patient    Methods  Explanation    Comprehension  Verbalized understanding       PT Short Term Goals - 06/04/18 1254      PT SHORT TERM GOAL #1   Title  Pt will improve R knee flexion by 10 degrees     Time  3    Period  Weeks    Status  New      PT SHORT TERM GOAL #2   Title  Pt will improve R  knee flexor and extensor strength to 4+/5     Time  3    Period  Weeks    Status  New      PT SHORT TERM GOAL #3   Title  Pt will report <2/10 pain with sit<>Stands     Time  3    Period  Weeks    Status  New        PT Long Term Goals - 06/04/18 1309      PT LONG TERM GOAL #1   Title  Pt will be able to sit or stand >1 hour without increased knee pain in order to perform ADLs     Time  6    Period  Weeks    Status  New      PT LONG TERM GOAL #2   Title  Pt will improve R knee strength to 5/5 in order to improve gait and mobility to return to work.     Baseline  met today 44%     Time  6    Period  Weeks    Status  New      PT LONG TERM GOAL #3   Title  Pt will decreased right knee edema by 2cm in order to improve overall knee mobility     Baseline  not yet 25%    Time  6    Period  Weeks      PT LONG TERM GOAL #4   Title  Pt will be able to ascend and descend stairs with 1 rail and good knee control, pain no more than minimal     Baseline  still pain and difficulty with stairs    Time  6    Period  Weeks    Status  On-going      PT LONG TERM GOAL #5   Title  Pt will increase hip strength to 4+/5 or better for improved knee stability with gait mechanics.     Time  6    Period  Weeks    Status  On-going            Plan - 06/04/18 1244    Clinical Impression Statement  Pt tolerating treatment well, Pt still amb with antalgic gait with decreased step length and stance phase on the R LE. Discussed using assistive device for safety and support during walking. 5 time sit to stand today was: 45.01 seconds with UE support. Pt progressing with gluteal strength. Continue to progress pt toward goals set.     Rehab Potential  Excellent    PT Frequency  2x / week    PT Duration  6 weeks    PT Next Visit Plan  hamstring strength, glut contraction    PT Home Exercise Plan  seated hamstring stretch, knee flexion stretch, quad sets, SAQ, and SLR, bridge    Consulted and  Agree with Plan of Care  Patient       Patient will benefit from skilled therapeutic intervention in order to improve the following deficits  and impairments:  Decreased balance, Decreased endurance, Decreased mobility, Difficulty walking, Hypomobility, Pain, Postural dysfunction, Impaired flexibility, Increased fascial restricitons, Decreased strength, Obesity, Decreased range of motion, Abnormal gait  Visit Diagnosis: Acute pain of right knee  Stiffness of right knee, not elsewhere classified  Muscle weakness (generalized)  Difficulty in walking, not elsewhere classified     Problem List Patient Active Problem List   Diagnosis Date Noted  . Status post arthroscopic surgery of right knee 05/09/2018  . Chronic pain of right knee 05/09/2018  . Closed fracture of right tibial plateau   . Unilateral primary osteoarthritis, right knee 04/23/2018  . Acute medial meniscus tear of right knee 04/23/2018  . Closed fracture of medial plateau of left tibia 04/23/2018  . Severe obesity (BMI >= 40) ([place]) 04/23/2018  . Osteoarthritis of right knee 03/09/2018  . Prediabetes 03/09/2018  . Bilateral knee pain 12/28/2017  . Constipation 12/18/2017  . Cough 12/15/2017  . Lower resp. tract infection 12/15/2017  . Acute conjunctivitis of right eye 12/15/2017  . H/O Paget's disease of bone 09/10/2017  . OSA on CPAP 10/28/2015  . Fibroids 08/07/2015  . Plantar fasciitis, bilateral 08/03/2015  . Metatarsal deformity 08/03/2015  . Equinus deformity of foot, acquired 08/03/2015  . Pronation deformity of both feet 08/03/2015  . Morbid obesity ([place]) 06/11/2015  . 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 Essential (primary) hypertension 06/11/2015  . 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 Benign essential HTN 09/19/2013  . Major depressive episode 09/19/2013    Oretha Caprice, PT 06/04/2018, 1:22 PM  [facility] [address] [place], [place], [address] Phone: [phone]   Fax:  [phone]  Name: Michelle Huerta MRN: [id number] Date of Birth: 1960-09-06

## 2018-06-04 NOTE — Progress Notes (Signed)
Michelle Huerta  MRN: [id number] DOB: 07-03-1961  PCP: Dorise Hiss, PA-C  Subjective:  Pt is a 57 year old female who presents to clinic for difficulty sleeping x several months.  This is happening every night  She wakes up every few hours then goes back to sleep She is not using her CPAP Has TV on before bed.  Denies racing thoughts.   Review of Systems  Respiratory: Negative for chest tightness, shortness of breath and wheezing.   Psychiatric/Behavioral: Positive for sleep disturbance. Negative for dysphoric mood. The patient is not nervous/anxious.     Patient Active Problem List   Diagnosis Date Noted  . Status post arthroscopic surgery of right knee 05/09/2018  . Chronic pain of right knee 05/09/2018  . Closed fracture of right tibial plateau   . Unilateral primary osteoarthritis, right knee 04/23/2018  . Acute medial meniscus tear of right knee 04/23/2018  . Closed fracture of medial plateau of left tibia 04/23/2018  . Severe obesity (BMI >= 40) ([place]) 04/23/2018  . Osteoarthritis of right knee 03/09/2018  . Prediabetes 03/09/2018  . Bilateral knee pain 12/28/2017  . Constipation 12/18/2017  . Cough 12/15/2017  . Lower resp. tract infection 12/15/2017  . Acute conjunctivitis of right eye 12/15/2017  . H/O Paget's disease of bone 09/10/2017  . OSA on CPAP 10/28/2015  . Fibroids 08/07/2015  . Plantar fasciitis, bilateral 08/03/2015  . Metatarsal deformity 08/03/2015  . Equinus deformity of foot, acquired 08/03/2015  . Pronation deformity of both feet 08/03/2015  . Morbid obesity ([place]) 06/11/2015  . 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 Essential (primary) hypertension 06/11/2015  . 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 Benign essential HTN 09/19/2013  . Major depressive episode 09/19/2013    Current Outpatient Medications on File Prior to Visit  Medication Sig Dispense Refill  . amLODipine (NORVASC) 5 MG tablet Take 1 tablet (5 mg total) by mouth  daily. 90 tablet 3  . cetirizine (ZYRTEC) 10 MG tablet Take 10 mg by mouth daily.    [name] estradiol (ESTRACE) 0.1 MG/GM vaginal cream Apply 1g 1 to 3 times per week 42.5 g 12  . HYDROcodone-acetaminophen (NORCO/VICODIN) 5-325 MG tablet Take 1-2 tablets by mouth every 6 (six) hours as needed for moderate pain. 30 tablet 0  . ibuprofen (ADVIL,MOTRIN) 800 MG tablet Take 1 tablet (800 mg total) by mouth every 8 (eight) hours as needed. 90 tablet 0  . telmisartan (MICARDIS) 80 MG tablet Take 1 tablet (80 mg total) by mouth daily. 90 tablet 3  . valACYclovir (VALTREX) 500 MG tablet Take 1 tablet (500 mg total) by mouth daily. 90 tablet 3  . oxyCODONE (ROXICODONE) 5 MG immediate release tablet Take 1-2 tablets (5-10 mg total) by mouth every 4 (four) hours as needed for severe pain. (Patient not taking: Reported on 06/04/2018) 40 tablet 0  . tiZANidine (ZANAFLEX) 4 MG tablet Take 1 tablet (4 mg total) by mouth every 6 (six) hours as needed for muscle spasms. (Patient not taking: Reported on 06/04/2018) 40 tablet 0   No current facility-administered medications on file prior to visit.     Allergies  Allergen Reactions  . Penicillins Rash and Other (See Comments)    Has patient had a PCN reaction causing immediate rash, facial/tongue/throat swelling, SOB or lightheadedness with hypotension: Yes Has patient had a PCN reaction causing severe rash involving mucus membranes or skin necrosis: No Has patient had a PCN reaction that required hospitalization: No Has patient had  a PCN reaction occurring within the last 10 years: No If all of the above answers are "NO", then may proceed with Cephalosporin use.   [name] Reclast [Zoledronic Acid] Nausea And Vomiting     Objective:  BP 138/82   Pulse 86   Temp 98.2 F (36.8 C) (Oral)   Resp 20   Ht 5' 6.4" (1.687 m)   Wt 299 lb (135.6 kg)   LMP 08/01/2012   SpO2 99%   BMI 47.68 kg/m   Physical Exam  Constitutional: She appears well-developed and  well-nourished.  Psychiatric: She has a normal mood and affect. Her behavior is normal. Judgment and thought content normal.  Vitals reviewed.   Assessment and Plan :  1. Insomnia, unspecified type - pt endorses difficulty staying asleep. Advised pt to start back on CPAP.  Will try otc melatonin, then hydroxyzine then trazodone. If not working she can try [place]. Medication side effects discussed. Sleep hygiene discussed.  - traZODone (DESYREL) 100 MG tablet; Take 1-3 tablets (100-300 mg total) by mouth at bedtime as needed for sleep.  Dispense: 90 tablet; Refill: 1 - hydrOXYzine (ATARAX/VISTARIL) 50 MG tablet; Take 1-2 tablets (50-100 mg total) by mouth every 8 (eight) hours as needed for itching.  Dispense: 90 tablet; Refill: 1 - zolpidem (AMBIEN) 5 MG tablet; Take 1 tablet (5 mg total) by mouth at bedtime as needed for sleep.  Dispense: 15 tablet; Refill: 1  Whitney Shawnte Demarest, PA-C  [facility] 06/04/2018 3:28 PM  Please note: Portions of this report may have been transcribed using dragon voice recognition software. Every effort was made to ensure accuracy; however, inadvertent computerized transcription errors may be present.

## 2018-06-04 NOTE — Patient Instructions (Addendum)
Start using your CPAP!  You not breathing right may be making you wake up.   Stop watching TV at least 30 min before bedtime.  Try Melatonin 30 min before bed. ( you can buy this at your pharmacy)   You can take:   Trazodone 100-300 mg/night. Hydroxyzine 50-100 mg at night  Insomnia Insomnia is a sleep disorder that makes it difficult to fall asleep or to stay asleep. Insomnia can cause tiredness (fatigue), low energy, difficulty concentrating, mood swings, and poor performance at work or school. There are three different ways to classify insomnia:  Difficulty falling asleep.  Difficulty staying asleep.  Waking up too early in the morning.  Any type of insomnia can be long-term (chronic) or short-term (acute). Both are common. Short-term insomnia usually lasts for three months or less. Chronic insomnia occurs at least three times a week for longer than three months. What are the causes? Insomnia may be caused by another condition, situation, or substance, such as:  Anxiety.  Certain medicines.  Gastroesophageal reflux disease (GERD) or other gastrointestinal conditions.  Asthma or other breathing conditions.  Restless legs syndrome, sleep apnea, or other sleep disorders.  Chronic pain.  Menopause. This may include hot flashes.  Stroke.  Abuse of alcohol, tobacco, or illegal drugs.  Depression.  Caffeine.  Neurological disorders, such as Alzheimer disease.  An overactive thyroid (hyperthyroidism).  The cause of insomnia may not be known. What increases the risk? Risk factors for insomnia include:  Gender. Women are more commonly affected than men.  Age. Insomnia is more common as you get older.  Stress. This may involve your professional or personal life.  Income. Insomnia is more common in people with lower income.  Lack of exercise.  Irregular work schedule or night shifts.  Traveling between different time zones.  What are the signs or  symptoms? If you have insomnia, trouble falling asleep or trouble staying asleep is the main symptom. This may lead to other symptoms, such as:  Feeling fatigued.  Feeling nervous about going to sleep.  Not feeling rested in the morning.  Having trouble concentrating.  Feeling irritable, anxious, or depressed.  How is this treated? Treatment for insomnia depends on the cause. If your insomnia is caused by an underlying condition, treatment will focus on addressing the condition. Treatment may also include:  Medicines to help you sleep.  Counseling or therapy.  Lifestyle adjustments.  Follow these instructions at home:  Take medicines only as directed by your health care provider.  Keep regular sleeping and waking hours. Avoid naps.  Keep a sleep diary to help you and your health care provider figure out what could be causing your insomnia. Include: ? When you sleep. ? When you wake up during the night. ? How well you sleep. ? How rested you feel the next day. ? Any side effects of medicines you are taking. ? What you eat and drink.  Make your bedroom a comfortable place where it is easy to fall asleep: ? Put up shades or special blackout curtains to block light from outside. ? Use a white noise machine to block noise. ? Keep the temperature cool.  Exercise regularly as directed by your health care provider. Avoid exercising right before bedtime.  Use relaxation techniques to manage stress. Ask your health care provider to suggest some techniques that may work well for you. These may include: ? Breathing exercises. ? Routines to release muscle tension. ? Visualizing peaceful scenes.  Cut back on  alcohol, caffeinated beverages, and cigarettes, especially close to bedtime. These can disrupt your sleep.  Do not overeat or eat spicy foods right before bedtime. This can lead to digestive discomfort that can make it hard for you to sleep.  Limit screen use before bedtime.  This includes: ? Watching TV. ? Using your smartphone, tablet, and computer.  Stick to a routine. This can help you fall asleep faster. Try to do a quiet activity, brush your teeth, and go to bed at the same time each night.  Get out of bed if you are still awake after 15 minutes of trying to sleep. Keep the lights down, but try reading or doing a quiet activity. When you feel sleepy, go back to bed.  Make sure that you drive carefully. Avoid driving if you feel very sleepy.  Keep all follow-up appointments as directed by your health care provider. This is important. Contact a health care provider if:  You are tired throughout the day or have trouble in your daily routine due to sleepiness.  You continue to have sleep problems or your sleep problems get worse. Get help right away if:  You have serious thoughts about hurting yourself or someone else. This information is not intended to replace advice given to you by your health care provider. Make sure you discuss any questions you have with your health care provider. Document Released: 06/24/2000 Document Revised: 11/27/2015 Document Reviewed: 03/28/2014 Elsevier Interactive Patient Education  [employer].   Thank you for coming in today. I hope you feel we met your needs.  Feel free to call PCP if you have any questions or further requests.  Please consider signing up for MyChart if you do not already have it, as this is a great way to communicate with me.  Best,  [employer], PA-C

## 2018-06-06 NOTE — Progress Notes (Signed)
Office: [phone]  /  Fax: [phone]   Dear Gelene Mink McVey, PA-C,   Thank you for referring Michelle Huerta to our clinic. The following note includes my evaluation and treatment recommendations.  HPI:   Chief Complaint: OBESITY    Michelle Huerta has been referred by Dorise Hiss, PA-C for consultation regarding her obesity and obesity related comorbidities.    Michelle Huerta (MR# [id number]) is a 57 y.o. female who presents on 06/06/2018 for obesity evaluation and treatment. Current BMI is Body mass index is 46.36 kg/m.[name] Michelle Huerta has been struggling with her weight for many years and has been unsuccessful in either losing weight, maintaining weight loss, or reaching her healthy weight goal. 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     Michelle Huerta attended our information session and states she is currently in the action stage of change and ready to dedicate time achieving and maintaining a healthier weight. Michelle Huerta is interested in becoming our patient and working on intensive lifestyle modifications including (but not limited to) diet, exercise and weight loss.    Michelle Huerta states she thinks her family will eat healthier with  her her desired weight loss is 76 lbs she started gaining weight between 1995 and 2000 her heaviest weight ever was 302 lbs. she is a picky eater and doesn't like to eat healthier foods  she has significant food cravings issues  she snacks frequently in the evenings she is frequently drinking liquids with calories she frequently makes poor food choices she frequently eats larger portions than normal  she has binge eating behaviors she struggles with emotional eating    Michelle Huerta feels her energy is lower than it should be. This has worsened with weight gain and has not worsened recently. Michelle Huerta admits to daytime somnolence and she admits to waking up still tired. Patient is at risk for obstructive sleep apnea. Patent has a history of  symptoms of daytime Michelle, morning Michelle and morning headache. Patient generally gets 2 or 3 hours of sleep per night, and states they generally have restless sleep. Snoring is not present. Apneic episodes are present. Epworth Sleepiness Score is 4  Dyspnea on exertion Michelle Huerta notes increasing shortness of breath with exercising and seems to be worsening over time with weight gain. She notes getting out of breath sooner with activity than she used to. This has not gotten worse recently. Michelle Huerta denies orthopnea.  Hyperglycemia Michelle Huerta has a history of some elevated blood glucose readings without a diagnosis of diabetes. There are no recent labs. She admits to polyphagia.  At risk for diabetes Michelle Huerta is at higher than average risk for developing diabetes due to her obesity and hyperglycemia. She currently denies polyuria or polydipsia.  Positive Depression Screen Michelle Huerta has a positive depression screen with a PHQ-9 score of 22 and she sometimes feels her weight makes life not worth living.  She shows no sign of suicidal or homicidal ideations.  Michelle Huerta Food and Mood (modified PHQ-9) score was  Depression screen PHQ 2/9 06/04/2018  Decreased Interest 1  Down, Depressed, Hopeless 1  PHQ - 2 Score 2  Altered sleeping -  Tired, decreased energy -  Change in appetite -  Feeling bad or failure about yourself  -  Trouble concentrating -  Moving slowly or fidgety/restless -  Suicidal thoughts -  PHQ-9 Score -  Difficult doing work/chores -    ALLERGIES: Allergies  Allergen Reactions  . Penicillins Rash and Other (See  Comments)    Has patient had a PCN reaction causing immediate rash, facial/tongue/throat swelling, SOB or lightheadedness with hypotension: Yes Has patient had a PCN reaction causing severe rash involving mucus membranes or skin necrosis: No Has patient had a PCN reaction that required hospitalization: No Has patient had a PCN reaction occurring within the last 10 years: No If all  of the above answers are "NO", then may proceed with Cephalosporin use.   [name] Reclast [Zoledronic Acid] Nausea And Vomiting    MEDICATIONS: Current Outpatient Medications on File Prior to Visit  Medication Sig Dispense Refill  . amLODipine (NORVASC) 5 MG tablet Take 1 tablet (5 mg total) by mouth daily. 90 tablet 3  . cetirizine (ZYRTEC) 10 MG tablet Take 10 mg by mouth daily.    [name] estradiol (ESTRACE) 0.1 MG/GM vaginal cream Apply 1g 1 to 3 times per week 42.5 g 12  . HYDROcodone-acetaminophen (NORCO/VICODIN) 5-325 MG tablet Take 1-2 tablets by mouth every 6 (six) hours as needed for moderate pain. 30 tablet 0  . ibuprofen (ADVIL,MOTRIN) 800 MG tablet Take 1 tablet (800 mg total) by mouth every 8 (eight) hours as needed. 90 tablet 0  . oxyCODONE (ROXICODONE) 5 MG immediate release tablet Take 1-2 tablets (5-10 mg total) by mouth every 4 (four) hours as needed for severe pain. (Patient not taking: Reported on 06/04/2018) 40 tablet 0  . telmisartan (MICARDIS) 80 MG tablet Take 1 tablet (80 mg total) by mouth daily. 90 tablet 3  . tiZANidine (ZANAFLEX) 4 MG tablet Take 1 tablet (4 mg total) by mouth every 6 (six) hours as needed for muscle spasms. (Patient not taking: Reported on 06/04/2018) 40 tablet 0  . valACYclovir (VALTREX) 500 MG tablet Take 1 tablet (500 mg total) by mouth daily. 90 tablet 3   No current facility-administered medications on file prior to visit.     PAST MEDICAL HISTORY: Past Medical History:  Diagnosis Date  . Allergy   . Arthritis of both knees   . Bilateral chronic knee pain   . Chronic back pain   . Constipation   . DDD (degenerative disc disease), cervical   . Depression    no meds  . Genital herpes    HSV type 2 at rectum, culture positive 3/15  . Glaucoma   . Grade I diastolic dysfunction 93/23/5573   noted on ECHO   . Heart murmur   . History of colon polyps   . HLD (hyperlipidemia)   . Hypertension   . Hypothyroidism   . Insomnia   . Internal  hemorrhoids   . Lactose intolerance   . Lupus ([place])   . LVH (left ventricular hypertrophy) 06/25/2015   Mild, noted on ECHO   . Mild sleep apnea    1/17 CPAP Dr Claiborne Billings does not use cpap   . Morbid obesity ([place]) 06/11/2015  . Osteoporosis   . Paget's disease   . Paget's disease of bone    Dr Trudie Reed, Reclast insufion 02/14/10 - bad reaction to infusion, now tolerates fosamax weekly  . Plantar fasciitis, bilateral   . PMB (postmenopausal bleeding)   . Pre-diabetes   . Sleep apnea   . Thrombocytopenia ([place])   . TR (tricuspid regurgitation) 06/25/2015   Trace, noted on ECHO   . Tubular adenoma polyp of rectum    8/12, Dr Fuller Plan, 5 yr follow up   . Uterine fibroid 10/19/2017   noted on CT pelvis  . Vitamin D deficiency     PAST  SURGICAL HISTORY: Past Surgical History:  Procedure Laterality Date  . AUGMENTATION MAMMAPLASTY Bilateral   . BREAST ENHANCEMENT SURGERY    . BREAST REDUCTION SURGERY    . COLONOSCOPY  01/19/2016  . DILATION AND CURETTAGE OF UTERUS N/A 06/24/2015   Procedure: DILATATION AND CURETTAGE;  Surgeon: Emily Filbert, MD;  Location: [facility];  Service: Gynecology;  Laterality: N/A;  . KNEE ARTHROSCOPY WITH SUBCHONDROPLASTY Right 05/03/2018   Procedure: RIGHT KNEE ARTHROSCOPY WITH DEBRIDEMENT, PARTIAL MEDIAL MENISCECTOMY SUBCHONDROPLASTY MEDIAL TIBIAL PLATEAU AND MEDIAL FEMORAL CONDYLE;  Surgeon: Mcarthur Rossetti, MD;  Location: [facility];  Service: Orthopedics;  Laterality: Right;  . REDUCTION MAMMAPLASTY Bilateral   . TOE SURGERY     removal of bone in each foot  . TUBAL LIGATION      SOCIAL HISTORY: Social History   Tobacco Use  . Smoking status: Former Smoker    Years: 7.00    Types: Cigars    Last attempt to quit: 08/11/2008    Years since quitting: 9.8  . Smokeless tobacco: Never Used  . Tobacco comment: smoked [employer], 1 pack per day (5 in a pack)  Substance Use Topics  . Alcohol use: Yes    Alcohol/week: 5.0 standard drinks    Types: 5 Cans of  beer per week    Comment: occ   . Drug use: Never    FAMILY HISTORY: Family History  Problem Relation Age of Onset  . Hypertension Mother   . Heart disease Mother   . Hypertension Father   . Hypertension Sister   . Cancer Maternal Grandmother   . Stomach cancer Paternal Grandmother   . Hypertension Son   . Hypertension Son   . Colon cancer Neg Hx     ROS: Review of Systems  Constitutional: Positive for malaise/Michelle.  Respiratory: Positive for shortness of breath.   Cardiovascular: Negative for orthopnea.       + Shortness of Breath with Activity + Leg Cramping  Gastrointestinal: Positive for constipation.  Genitourinary: Negative for frequency.  Musculoskeletal: Positive for back pain.  Endo/Heme/Allergies: Negative for polydipsia.       + Polyphagia + Hyperglycemia  Psychiatric/Behavioral: Negative for suicidal ideas. The patient has insomnia.     PHYSICAL EXAM: Blood pressure 137/88, pulse 71, height 5\' 7"  (1.702 m), weight 296 lb (134.3 kg), last menstrual period 08/01/2012, SpO2 98 %. Body mass index is 46.36 kg/m. Physical Exam  Constitutional: She is oriented to person, place, and time. She appears well-developed and well-nourished.  HENT:  Head: Normocephalic and atraumatic.  Nose: Nose normal.  Eyes: EOM are normal. No scleral icterus.  Neck: Normal range of motion. Neck supple. No thyromegaly present.  Cardiovascular: Normal rate and regular rhythm.  Pulmonary/Chest: Effort normal. No respiratory distress.  Abdominal: Soft. There is no tenderness.  + Obesity  Musculoskeletal: Normal range of motion.  Range of Motion normal in all 4 extremities  Neurological: She is alert and oriented to person, place, and time. Coordination normal.  Skin: Skin is warm and dry.  + Acanthosis Nigricans  Psychiatric: She has a normal mood and affect. She expresses no homicidal and no suicidal ideation.  Vitals reviewed.   RECENT LABS AND TESTS: BMET    Component  Value Date/Time   NA 139 05/30/2018 1116   K 4.2 05/30/2018 1116   CL 101 05/30/2018 1116   CO2 23 05/30/2018 1116   GLUCOSE 86 05/30/2018 1116   GLUCOSE 104 (H) 04/26/2018 0837   BUN 13 05/30/2018  1116   CREATININE 0.73 05/30/2018 1116   CALCIUM 9.2 05/30/2018 1116   GFRNONAA 93 05/30/2018 1116   GFRAA 107 05/30/2018 1116   Lab Results  Component Value Date   HGBA1C 6.3 (H) 05/30/2018   Lab Results  Component Value Date   INSULIN 41.4 (H) 05/30/2018   CBC    Component Value Date/Time   WBC 6.4 05/30/2018 1116   WBC 6.0 04/26/2018 0837   RBC 4.57 05/30/2018 1116   RBC 4.71 04/26/2018 0837   HGB 11.6 05/30/2018 1116   HCT 37.0 05/30/2018 1116   PLT 173 05/30/2018 1116   MCV 81 05/30/2018 1116   MCH 25.4 (L) 05/30/2018 1116   MCH 25.9 (L) 04/26/2018 0837   MCHC 31.4 (L) 05/30/2018 1116   MCHC 29.8 (L) 04/26/2018 0837   RDW 13.7 05/30/2018 1116   LYMPHSABS 2.4 05/30/2018 1116   MONOABS 0.4 06/19/2012 0936   EOSABS 0.1 05/30/2018 1116   BASOSABS 0.0 05/30/2018 1116   Iron/TIBC/Ferritin/ %Sat No results found for: IRON, TIBC, FERRITIN, IRONPCTSAT Lipid Panel     Component Value Date/Time   CHOL 134 05/30/2018 1116   TRIG 85 05/30/2018 1116   HDL 75 05/30/2018 1116   CHOLHDL 1.8 05/30/2018 1116   LDLCALC 42 05/30/2018 1116   Hepatic Function Panel     Component Value Date/Time   PROT 7.8 05/30/2018 1116   ALBUMIN 3.9 05/30/2018 1116   AST 29 05/30/2018 1116   ALT 40 (H) 05/30/2018 1116   ALKPHOS 91 05/30/2018 1116   BILITOT 0.5 05/30/2018 1116      Component Value Date/Time   TSH 1.280 05/30/2018 1116   TSH 1.220 04/26/2018 1647   TSH 1.738 04/06/2015 1520    INDIRECT CALORIMETER done today shows a VO2 of 332 and a REE of 2314.  Her calculated basal metabolic rate is 4098 thus her basal metabolic rate is better than expected.    ASSESSMENT AND PLAN: Other Michelle - Plan: CBC with Differential/Platelet, Comprehensive metabolic panel, Folate, Lipid  panel, T4, free, T3, TSH, Vitamin B12, VITAMIN D 25 Hydroxy (Vit-D Deficiency, Fractures)  Shortness of breath on exertion  Hyperglycemia - Plan: Hemoglobin A1c, Insulin, random  Positive depression screening  Screening for depression  At risk for diabetes mellitus  Class 3 severe obesity with serious comorbidity and body mass index (BMI) of 45.0 to 49.9 in adult, unspecified obesity type (HCC)  PLAN: Michelle Michelle Huerta was informed that her Michelle may be related to obesity, depression or many other causes. Labs will be ordered, and in the meanwhile Michelle Huerta has agreed to work on diet, exercise and weight loss to help with Michelle. Proper sleep hygiene was discussed including the need for 7-8 hours of quality sleep each night. A sleep study was not ordered based on symptoms and Epworth score.  Dyspnea on exertion Michelle Huerta's shortness of breath appears to be obesity related and exercise induced. She has agreed to work on weight loss and gradually increase exercise to treat her exercise induced shortness of breath. If Michelle Huerta follows our instructions and loses weight without improvement of her shortness of breath, we will plan to refer to pulmonology. We will monitor this condition regularly. Katelyn agrees to this plan.  Hyperglycemia Fasting labs will be obtained and results with be discussed with Michelle Huerta in 2 weeks at her follow up visit. In the meanwhile Michelle Huerta was started on a lower simple carbohydrate diet and will work on weight loss efforts.  Diabetes risk counseling Neyla was given extended (  15 minutes) diabetes prevention counseling today. She is 57 y.o. female and has risk factors for diabetes including obesity and hyperglycemia. We discussed intensive lifestyle modifications today with an emphasis on weight loss as well as increasing exercise and decreasing simple carbohydrates in her diet.  Positive Depression Screen Bailee had a strongly positive depression screening. Depression is commonly associated  with obesity and often results in emotional eating behaviors. We will monitor this closely and work on CBT to help improve the non-hunger eating patterns. We will refer Breonia to Dr. Mallie Mussel, our bariatric psychologist.  Obesity Dimples is currently in the action stage of change and her goal is to continue with weight loss efforts. I recommend Camrynn begin the structured treatment plan as follows:  She has agreed to follow the Category 3 plan Simmie has been instructed to eventually work up to a goal of 150 minutes of combined cardio and strengthening exercise per week for weight loss and overall health benefits. We discussed the following Behavioral Modification Strategies today: increasing lean protein intake, decreasing simple carbohydrates , work on meal planning and easy cooking plans and holiday eating strategies    She was informed of the importance of frequent follow up visits to maximize her success with intensive lifestyle modifications for her multiple health conditions. She was informed we would discuss her lab results at her next visit unless there is a critical issue that needs to be addressed sooner. Tacia agreed to keep her next visit at the agreed upon time to discuss these results.    OBESITY BEHAVIORAL INTERVENTION VISIT  Today's visit was # 1   Starting weight: 296 lbs Starting date: 05/30/2018 Today's weight : 296 lbs  Today's date: 05/30/2018 Total lbs lost to date: 0   ASK: We discussed the diagnosis of obesity with [name] today and Hien agreed to give [place] permission to discuss obesity behavioral modification therapy today.  ASSESS: Shontell has the diagnosis of obesity and her BMI today is 46.35 Javeah is in the action stage of change   ADVISE: Ayra was educated on the multiple health risks of obesity as well as the benefit of weight loss to improve her health. She was advised of the need for long term treatment and the importance of lifestyle modifications to improve her  current health and to decrease her risk of future health problems.  AGREE: Multiple dietary modification options and treatment options were discussed and  [name] agreed to follow the recommendations documented in the above note.  ARRANGE: Carle was educated on the importance of frequent visits to treat obesity as outlined per CMS and USPSTF guidelines and agreed to schedule her next follow up appointment today.  I, Doreene Nest, am acting as transcriptionist for Dennard Nip, MD   I have reviewed the above documentation for accuracy and completeness, and I agree with the above. -Dennard Nip, MD

## 2018-06-09 ENCOUNTER — Ambulatory Visit: Payer: 59 | Admitting: Physician Assistant

## 2018-06-12 ENCOUNTER — Encounter (INDEPENDENT_AMBULATORY_CARE_PROVIDER_SITE_OTHER): Payer: Self-pay | Admitting: Family Medicine

## 2018-06-12 ENCOUNTER — Ambulatory Visit: Payer: 59 | Attending: Family Medicine | Admitting: Physical Therapy

## 2018-06-12 ENCOUNTER — Encounter: Payer: Self-pay | Admitting: Physical Therapy

## 2018-06-12 DIAGNOSIS — M25661 Stiffness of right knee, not elsewhere classified: Secondary | ICD-10-CM | POA: Diagnosis not present

## 2018-06-12 DIAGNOSIS — R262 Difficulty in walking, not elsewhere classified: Secondary | ICD-10-CM | POA: Diagnosis not present

## 2018-06-12 DIAGNOSIS — M25561 Pain in right knee: Secondary | ICD-10-CM | POA: Insufficient documentation

## 2018-06-12 DIAGNOSIS — M6281 Muscle weakness (generalized): Secondary | ICD-10-CM | POA: Insufficient documentation

## 2018-06-12 NOTE — Therapy (Signed)
[facility] [place], [place], [address] Phone: [phone]   Fax:  [phone]  Physical Therapy Treatment  Patient Details  Name: Michelle Huerta MRN: [id number] Date of Birth: 1961/04/12 Referring Provider (PT): Santiago Bumpers    Encounter Date: 06/12/2018  PT End of Session - 06/12/18 1024    Visit Number  4    Number of Visits  12    Date for PT Re-Evaluation  07/04/18    Authorization Type  [employer] [employer]    PT Start Time  1018    PT Stop Time  1100    PT Time Calculation (min)  42 min    Activity Tolerance  Patient tolerated treatment well    Behavior During Therapy  [facility] for tasks assessed/performed       Past Medical History:  Diagnosis Date  . Allergy   . Arthritis of both knees   . Bilateral chronic knee pain   . Chronic back pain   . Constipation   . DDD (degenerative disc disease), cervical   . Depression    no meds  . Genital herpes    HSV type 2 at rectum, culture positive 3/15  . Glaucoma   . Grade I diastolic dysfunction 86/75/4492   noted on ECHO   . Heart murmur   . History of colon polyps   . HLD (hyperlipidemia)   . Hypertension   . Hypothyroidism   . Insomnia   . Internal hemorrhoids   . Lactose intolerance   . Lupus ([place])   . LVH (left ventricular hypertrophy) 06/25/2015   Mild, noted on ECHO   . Mild sleep apnea    1/17 CPAP Dr Claiborne Billings does not use cpap   . Morbid obesity ([place]) 06/11/2015  . Osteoporosis   . Paget's disease   . Paget's disease of bone    Dr Trudie Reed, Reclast insufion 02/14/10 - bad reaction to infusion, now tolerates fosamax weekly  . Plantar fasciitis, bilateral   . PMB (postmenopausal bleeding)   . Pre-diabetes   . Sleep apnea   . Thrombocytopenia ([place])   . TR (tricuspid regurgitation) 06/25/2015   Trace, noted on ECHO   . Tubular adenoma polyp of rectum    8/12, Dr Fuller Plan, 5 yr follow up   . Uterine fibroid 10/19/2017   noted on CT pelvis  .  Vitamin D deficiency     Past Surgical History:  Procedure Laterality Date  . AUGMENTATION MAMMAPLASTY Bilateral   . BREAST ENHANCEMENT SURGERY    . BREAST REDUCTION SURGERY    . COLONOSCOPY  01/19/2016  . DILATION AND CURETTAGE OF UTERUS N/A 06/24/2015   Procedure: DILATATION AND CURETTAGE;  Surgeon: Emily Filbert, MD;  Location: [facility];  Service: Gynecology;  Laterality: N/A;  . KNEE ARTHROSCOPY WITH SUBCHONDROPLASTY Right 05/03/2018   Procedure: RIGHT KNEE ARTHROSCOPY WITH DEBRIDEMENT, PARTIAL MEDIAL MENISCECTOMY SUBCHONDROPLASTY MEDIAL TIBIAL PLATEAU AND MEDIAL FEMORAL CONDYLE;  Surgeon: Mcarthur Rossetti, MD;  Location: [facility];  Service: Orthopedics;  Laterality: Right;  . REDUCTION MAMMAPLASTY Bilateral   . TOE SURGERY     removal of bone in each foot  . TUBAL LIGATION      There were no vitals filed for this visit.  Subjective Assessment - 06/12/18 1022    Subjective  Patient reports the knee is improving. When she is walking she is having pain but otherwise no pain. Her pain can reach 3/10 when she is walking. She has not  been doing her exercises.     Pertinent History  Paget's Disease, Discoid Lupus     Limitations  Sitting;Standing;Walking    How long can you sit comfortably?  30 minutes     How long can you walk comfortably?  not walking too far right now before needs rest     Diagnostic tests  Xray showing Medial right knee space narrowing     Patient Stated Goals  Improve pain to get back to work     Currently in Pain?  Yes    Pain Score  3     Pain Location  Knee    Pain Orientation  Right    Pain Descriptors / Indicators  Aching;Sore    Pain Type  Surgical pain    Pain Onset  1 to 4 weeks ago    Pain Frequency  Intermittent    Aggravating Factors   staning and sitting     Pain Relieving Factors  rest,     Effect of Pain on Daily Activities  decreased mobility          [facility] PT Assessment - 06/12/18 0001      AROM   Right Knee Extension  0    Right  Knee Flexion  110                   [facility] Adult PT Treatment/Exercise - 06/12/18 0001      Exercises   Exercises  Knee/Hip      Knee/Hip Exercises: Standing   Heel Raises Limitations  2x10     Hip Flexion Limitations  standing march x10     Abduction Limitations  x10 right     Extension Limitations  x10 right       Knee/Hip Exercises: Seated   Other Seated Knee/Hip Exercises  hip abduction 2x10 green       Knee/Hip Exercises: Supine   Quad Sets  10 reps;Limitations    Quad Sets Limitations  5 secs holds     Short Arc Quad Sets  Strengthening;15 reps    Heel Slides  AAROM;Strengthening;Both;1 set;10 reps    Straight Leg Raises  Right;2 sets;10 reps    Patellar Mobs  assessed but no limitation today    Other Supine Knee/Hip Exercises  hamstring sets x 15 holding 5 seconds             PT Education - 06/12/18 1024    Education Details  improtance of doing exercises.     Person(s) Educated  Patient    Methods  Explanation    Comprehension  Verbalized understanding;Returned demonstration;Verbal cues required;Tactile cues required       PT Short Term Goals - 06/12/18 1205      PT SHORT TERM GOAL #1   Title  Pt will improve R knee flexion by 10 degrees     Time  3    Period  Weeks    Status  On-going      PT SHORT TERM GOAL #2   Title  Pt will improve R knee flexor and extensor strength to 4+/5     Time  3    Period  Weeks    Status  On-going      PT SHORT TERM GOAL #3   Title  Pt will report <2/10 pain with sit<>Stands     Time  3    Period  Weeks    Status  On-going        PT  Long Term Goals - 06/04/18 1309      PT LONG TERM GOAL #1   Title  Pt will be able to sit or stand >1 hour without increased knee pain in order to perform ADLs     Time  6    Period  Weeks    Status  New      PT LONG TERM GOAL #2   Title  Pt will improve R knee strength to 5/5 in order to improve gait and mobility to return to work.     Baseline  met today 44%      Time  6    Period  Weeks    Status  New      PT LONG TERM GOAL #3   Title  Pt will decreased right knee edema by 2cm in order to improve overall knee mobility     Baseline  not yet 25%    Time  6    Period  Weeks      PT LONG TERM GOAL #4   Title  Pt will be able to ascend and descend stairs with 1 rail and good knee control, pain no more than minimal     Baseline  still pain and difficulty with stairs    Time  6    Period  Weeks    Status  On-going      PT LONG TERM GOAL #5   Title  Pt will increase hip strength to 4+/5 or better for improved knee stability with gait mechanics.     Time  6    Period  Weeks    Status  On-going            Plan - 06/12/18 1202    Clinical Impression Statement  Patient tolerated treatment well. She was strongly encouraged to do her exercises at home. She was given standing exercises forhip strength and stability. Therapy updated HEP. Therapy will continue to progress the patient as tolerated.     Clinical Presentation  Stable    Clinical Decision Making  Low    Rehab Potential  Excellent    PT Frequency  2x / week    PT Duration  6 weeks    PT Treatment/Interventions  ADLs/Self Care Home Management;Cryotherapy;Ultrasound;Moist Heat;Iontophoresis 77m/ml Dexamethasone;Electrical Stimulation;DME Instruction;Gait training;Stair training;Balance training;Therapeutic exercise;Therapeutic activities;Functional mobility training;Neuromuscular re-education;Patient/family education;Manual techniques;Passive range of motion;Taping;Vasopneumatic Device;Scar mobilization;Joint Manipulations    PT Next Visit Plan  hamstring strength, glut contraction    PT Home Exercise Plan  seated hamstring stretch, knee flexion stretch, quad sets, SAQ, and SLR, bridge    Consulted and Agree with Plan of Care  Patient       Patient will benefit from skilled therapeutic intervention in order to improve the following deficits and impairments:  Decreased balance, Decreased  endurance, Decreased mobility, Difficulty walking, Hypomobility, Pain, Postural dysfunction, Impaired flexibility, Increased fascial restricitons, Decreased strength, Obesity, Decreased range of motion, Abnormal gait  Visit Diagnosis: Acute pain of right knee  Stiffness of right knee, not elsewhere classified  Muscle weakness (generalized)  Difficulty in walking, not elsewhere classified     Problem List Patient Active Problem List   Diagnosis Date Noted  . Status post arthroscopic surgery of right knee 05/09/2018  . Chronic pain of right knee 05/09/2018  . Closed fracture of right tibial plateau   . Unilateral primary osteoarthritis, right knee 04/23/2018  . Acute medial meniscus tear of right knee 04/23/2018  . Closed fracture of medial plateau  of left tibia 04/23/2018  . Severe obesity (BMI >= 40) ([place]) 04/23/2018  . Osteoarthritis of right knee 03/09/2018  . Prediabetes 03/09/2018  . Bilateral knee pain 12/28/2017  . Constipation 12/18/2017  . Cough 12/15/2017  . Lower resp. tract infection 12/15/2017  . Acute conjunctivitis of right eye 12/15/2017  . H/O Paget's disease of bone 09/10/2017  . OSA on CPAP 10/28/2015  . Fibroids 08/07/2015  . Plantar fasciitis, bilateral 08/03/2015  . Metatarsal deformity 08/03/2015  . Equinus deformity of foot, acquired 08/03/2015  . Pronation deformity of both feet 08/03/2015  . Morbid obesity ([place]) 06/11/2015  . 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 Essential (primary) hypertension 06/11/2015  . 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 Benign essential HTN 09/19/2013  . Major depressive episode 09/19/2013    Carney Living PT DPT  06/12/2018, 12:08 PM     [facility] [address] [place], [place], [address] Phone: [phone]   Fax:  [phone]  Name: Michelle Huerta MRN: [id number] Date of Birth: May 07, 1961

## 2018-06-13 NOTE — Progress Notes (Signed)
Office: [phone]  /  Fax: [phone]   Date: June 14, 2018 Time Seen: 11:05am Duration: 48 minutes Provider: Glennie Isle, PsyD Type of Session: Intake for Individual Therapy   Informed Consent: The provider's role was explained to Michelle Huerta. The provider reviewed and discussed issues of confidentiality, privacy, and limits therein. In addition to verbal informed consent, written informed consent for psychological services was obtained from [facility] prior to the initial intake interview. Written consent included information concerning the practice, financial arrangements, and confidentiality and patients' rights. Since the clinic is not a 24/7 crisis center, mental health emergency resources were shared and a handout was provided. The provider further explained the utilization of MyChart, e-mail, voicemail, and/or other messaging systems can be utilized for non-emergency reasons. Jamayia verbally acknowledged understanding of the aforementioned, and agreed to use mental health emergency resources discussed if needed. Moreover, Kealy agreed information may be shared with other [facility] providers as needed for coordination of care, and written consent was obtained.   Chief Complaint: [name] was referred by Dr. Dennard Nip due to a positive depression screen. Per the note for the initial visit with Dr. Dennard Nip on May 30, 2018, "Analese has a positive depression screen with a PHQ-9 score of 22 and she sometimes feels her weight makes life not worth living. She shows no sign of suicidal or homicidal ideations."  During today's appointment, [name] reported, "I am here to lose weight." She discussed ongoing stressors that impact her eating habits, including the recent loss of loved ones. The last episode of emotional eating and overeating was this past weekend when she went out to eat. [name] shared she tends to crave sweets, such as cookies and candy.  [name] was asked to  complete a questionnaire assessing various behaviors related to emotional eating. [name] endorsed the following: overeat when you are celebrating, eat certain foods when you are anxious, stressed, depressed, or your feelings are hurt, use food to help you cope with emotional situations, find food is comforting to you, overeat when you are angry or upset, overeat when you are worried about something, overeat frequently when you are bored or lonely, overeat when you are angry at someone just to show them they cannot control you and overeat when you are alone, but eat much less when you are with other people.  HPI:  Per the note for the initial visit with Dr. Dennard Nip on May 30, 2018, [name] she gaining weight between 1995 and 2000 and her heaviest weight ever was 302 pounds. During the initial appointment with Dr. Dennard Nip, [name] reported experiencing the following: significant food cravings issues , snacking frequently in the evenings, frequently drinking liquids with calories, frequently making poor food choices, frequently eating larger portions than normal , binge eating behaviors and struggling with emotional eating. Additionally, [name] described herself as a picky eater and she does not like to eat healthier foods. During today's appointment, [name] reported the onset of overeating was in childhood. She explained, "I do not like to see food wasted." Regarding the onset of emotional eating, [name] stated, "I have been like that."  However, she noted an exacerbation approximately 10 years ago due to stressors with the father of her children. She denied a history of binge eating. [name] also denied a history of purging and engagement in other compensatory strategies.  She has never been diagnosed with an eating disorder.  Mental Status Examination: [name] arrived on time for the appointment; however, the  appointment was initiated late due to a delay in the check-in process. She presented as appropriately  dressed and groomed. [name] appeared her stated age and demonstrated adequate orientation to time, place, person, and purpose of the appointment. She also demonstrated appropriate eye contact. No psychomotor abnormalities or behavioral peculiarities noted. Her mood was euthymic with congruent affect. Her thought processes were logical, linear, and goal-directed. No hallucinations, delusions, bizarre thinking or behavior reported or observed. Judgment, insight, and impulse control appeared to be grossly intact. There was no evidence of paraphasias (i.e., errors in speech, gross mispronunciations, and word substitutions), repetition deficits, or disturbances in volume or prosody (i.e., rhythm and intonation). There was no evidence of attention or memory impairments. [name] denied current suicidal and homicidal ideation, plan, and intent.   The Montreal Cognitive Assessment (MoCA) was administered. The MoCA assesses different cognitive domains: attention and concentration, executive functions, memory, language, visuoconstructional skills, conceptual thinking, calculations, and orientation. Frimy received 27 out of 30 points possible on the MoCA, which is noted in the normal range. A point was added to the total score due to years of formal education being 12 years or fewer. A point was lost on the visuospatial/executive task requiring [name] to replicate a visual stimuli. In addition, two points were lost on the abstraction task requiring [name] to identify the similarities between two things. Additionally, one point was lost on the delayed recall task, as [name] recalled four out of five words after a short delay. She was unable to recall the remaining word with a category cue; however, with an additional multiple choice cue, she recalled the remaining word.   Family & Psychosocial History: [name] shared she has been in a relationship " off and on" for the past 15 years. She has 2 adult sons (ages 68 and 58). [name] shared she  is currently employed with [employer] in housekeeping; however, has been off of work due to a surgery. She noted her highest level of education is a high school diploma. Christella stated her social support system consists of her children, mother, and coworkers. 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  Medical History:  Past Medical History:  Diagnosis Date  . Allergy   . Arthritis of both knees   . Bilateral chronic knee pain   . Chronic back pain   . Constipation   . DDD (degenerative disc disease), cervical   . Depression    no meds  . Genital herpes    HSV type 2 at rectum, culture positive 3/15  . Glaucoma   . Grade I diastolic dysfunction 03/50/0938   noted on ECHO   . Heart murmur   . History of colon polyps   . HLD (hyperlipidemia)   . Hypertension   . Hypothyroidism   . Insomnia   . Internal hemorrhoids   . Lactose intolerance   . Lupus ([place])   . LVH (left ventricular hypertrophy) 06/25/2015   Mild, noted on ECHO   . Mild sleep apnea    1/17 CPAP Dr Claiborne Billings does not use cpap   . Morbid obesity ([employer]) 06/11/2015  . Osteoporosis   . Paget's disease   . Paget's disease of bone    Dr Trudie Reed, Reclast insufion 02/14/10 - bad reaction to infusion, now tolerates fosamax weekly  . Plantar fasciitis, bilateral   . PMB (postmenopausal bleeding)   . Pre-diabetes   . Sleep apnea   . Thrombocytopenia ([place])   . TR (tricuspid regurgitation) 06/25/2015   Trace, noted on ECHO   .  Tubular adenoma polyp of rectum    8/12, Dr Fuller Plan, 5 yr follow up   . Uterine fibroid 10/19/2017   noted on CT pelvis  . Vitamin D deficiency    Past Surgical History:  Procedure Laterality Date  . AUGMENTATION MAMMAPLASTY Bilateral   . BREAST ENHANCEMENT SURGERY    . BREAST REDUCTION SURGERY    . COLONOSCOPY  01/19/2016  . DILATION AND CURETTAGE OF UTERUS N/A 06/24/2015   Procedure: DILATATION AND CURETTAGE;  Surgeon: Emily Filbert, MD;  Location: [facility];  Service: Gynecology;  Laterality: N/A;  . KNEE  ARTHROSCOPY WITH SUBCHONDROPLASTY Right 05/03/2018   Procedure: RIGHT KNEE ARTHROSCOPY WITH DEBRIDEMENT, PARTIAL MEDIAL MENISCECTOMY SUBCHONDROPLASTY MEDIAL TIBIAL PLATEAU AND MEDIAL FEMORAL CONDYLE;  Surgeon: Mcarthur Rossetti, MD;  Location: [facility];  Service: Orthopedics;  Laterality: Right;  . REDUCTION MAMMAPLASTY Bilateral   . TOE SURGERY     removal of bone in each foot  . TUBAL LIGATION     Current Outpatient Medications on File Prior to Visit  Medication Sig Dispense Refill  . amLODipine (NORVASC) 5 MG tablet Take 1 tablet (5 mg total) by mouth daily. 90 tablet 3  . cetirizine (ZYRTEC) 10 MG tablet Take 10 mg by mouth daily.    [name] estradiol (ESTRACE) 0.1 MG/GM vaginal cream Apply 1g 1 to 3 times per week 42.5 g 12  . HYDROcodone-acetaminophen (NORCO/VICODIN) 5-325 MG tablet Take 1-2 tablets by mouth every 6 (six) hours as needed for moderate pain. 30 tablet 0  . hydrOXYzine (ATARAX/VISTARIL) 50 MG tablet Take 1-2 tablets (50-100 mg total) by mouth every 8 (eight) hours as needed for itching. 90 tablet 1  . ibuprofen (ADVIL,MOTRIN) 800 MG tablet Take 1 tablet (800 mg total) by mouth every 8 (eight) hours as needed. 90 tablet 0  . oxyCODONE (ROXICODONE) 5 MG immediate release tablet Take 1-2 tablets (5-10 mg total) by mouth every 4 (four) hours as needed for severe pain. (Patient not taking: Reported on 06/04/2018) 40 tablet 0  . telmisartan (MICARDIS) 80 MG tablet Take 1 tablet (80 mg total) by mouth daily. 90 tablet 3  . tiZANidine (ZANAFLEX) 4 MG tablet Take 1 tablet (4 mg total) by mouth every 6 (six) hours as needed for muscle spasms. (Patient not taking: Reported on 06/04/2018) 40 tablet 0  . traZODone (DESYREL) 100 MG tablet Take 1-3 tablets (100-300 mg total) by mouth at bedtime as needed for sleep. 90 tablet 1  . valACYclovir (VALTREX) 500 MG tablet Take 1 tablet (500 mg total) by mouth daily. 90 tablet 3  . zolpidem (AMBIEN) 5 MG tablet Take 1 tablet (5 mg total) by mouth at  bedtime as needed for sleep. 15 tablet 1   No current facility-administered medications on file prior to visit.   Adja denied a history of head injuries and loss of consciousness.   Mental Health History: Lendora denied a history of therapeutic services; however, reported she met with a psychiatrist for one appointment approximately 10 years ago due to ongoing stressors. She denied being prescribed any medications at that time. Approximately 7-8 years ago, Cornesha shared she was prescribed an antidepressant by her primary care physician. Currently, she is not prescribed any psychotropic medications. Krystena denied a family history of mental health concerns. Gavriella denied a trauma history, including psychological, physical  and sexual abuse, as well as neglect.   Masiah reported experiencing the following: anhedonia, depressed mood, trouble falling asleep, fatigue, overeating and moving/speaking slowly. Henritta reported experiencing worry thoughts  regarding the following: son's well-being and her health. She indicated she averages approximately three hours of sleep; therefore, it was recommended she speak with Dr. Leafy Ro during their appointment today. Khrista agreed. Moreover, she noted experiencing decreased self-esteem due to her overeating habits.   Amaani denied experiencing the following: hopelessness, attention and concentration issues, memory concerns, feeling fidgety/restless, irritability, becoming easily annoyed, obsessions and compulsions, hallucinations and delusions, mania, angry outbursts and social withdrawal. She denied use of illicit and recreational substances, but noted alcohol use. Tylynn stated she consumes alcohol socially approximately once or twice a month in the form of 40-50oz. of beer over the course course of three hours. She denied drinking to the point of intoxication. Natacha also denied a legal history. Notably, Synthia endorsed question 9 on the modified PHQ-9 that was administered during her first  appointment with Dr. Leafy Ro on May 30, 2018. Jakirah clarified she endorsed that items due to hopelessness related to food and not due to suicidal ideation.  She also denied experiencing history of and current suicidal ideation, plan, and intent; history of and current homicidal ideation, plan, and intent; and history of and current engagement in self-harm.  Structured Assessment Results: The Patient Health Questionnaire-9 (PHQ-9) is a self-report measure that assesses symptoms and severity of depression over the course of the last two weeks. Shaniyah obtained a score of 15 suggesting moderately severe depression. Kamesha finds the endorsed symptoms to be somewhat difficult. Depression screen PHQ 2/9 06/14/2018  Decreased Interest 3  Down, Depressed, Hopeless 1  PHQ - 2 Score 4  Altered sleeping 3  Tired, decreased energy 3  Change in appetite 2  Feeling bad or failure about yourself  1  Trouble concentrating 1  Moving slowly or fidgety/restless 1  Suicidal thoughts 0  PHQ-9 Score 15  Difficult doing work/chores -   The Generalized Anxiety Disorder-7 (GAD-7) is a brief self-report measure that assesses symptoms of anxiety over the course of the last two weeks. Allayna obtained a score of 10 suggesting moderate anxiety.  GAD 7 : Generalized Anxiety Score 06/14/2018  Nervous, Anxious, on Edge 1  Control/stop worrying 2  Worry too much - different things 3  Trouble relaxing 3  Restless 0  Easily annoyed or irritable 0  Afraid - awful might happen 1  Total GAD 7 Score 10  Anxiety Difficulty Somewhat difficult   Interventions: A chart review was conducted prior to the clinical intake interview. The MoCA, PHQ-9, and GAD-7 were administered and a clinical intake interview was completed. In addition, Simrin was asked to complete a Mood and Food questionnaire to assess various behaviors related to emotional eating. Throughout session, empathic reflections and validation was provided. Continuing treatment  with this provider was discussed and a treatment goal was established. Psychoeducation regarding emotional versus physical hunger was provided. Carmeline was given a handout to utilize between now and the next appointment to increase awareness of hunger patterns and subsequent eating.   Provisional DSM-5 Diagnosis: 311 (F32.8) Other Specified Depressive Disorder, Emotional Eating Behaviors  Plan: Averie expressed understanding and agreement with the initial treatment plan of care. She appears able and willing to participate as evidenced by collaboration on a treatment goal, engagement in reciprocal conversation, and asking questions as needed for clarification. The next appointment will be scheduled in one month due to finances and the upcoming holidays. Honestee verbally acknowledged understanding that she could call this provider's office and request an earlier appointment if needed. The following treatment goal was established: decrease emotional  eating. For the aforementioned goal, Laraine can benefit from sessions that are brief in duration for approximately four to six sessions.

## 2018-06-14 ENCOUNTER — Ambulatory Visit (INDEPENDENT_AMBULATORY_CARE_PROVIDER_SITE_OTHER): Payer: 59 | Admitting: Psychology

## 2018-06-14 ENCOUNTER — Encounter: Payer: 59 | Admitting: Physical Therapy

## 2018-06-14 ENCOUNTER — Ambulatory Visit (INDEPENDENT_AMBULATORY_CARE_PROVIDER_SITE_OTHER): Payer: 59 | Admitting: Family Medicine

## 2018-06-14 ENCOUNTER — Encounter (INDEPENDENT_AMBULATORY_CARE_PROVIDER_SITE_OTHER): Payer: Self-pay | Admitting: Family Medicine

## 2018-06-14 VITALS — BP 123/82 | HR 76 | Ht 67.0 in | Wt 298.0 lb

## 2018-06-14 DIAGNOSIS — Z6841 Body Mass Index (BMI) 40.0 and over, adult: Secondary | ICD-10-CM

## 2018-06-14 DIAGNOSIS — E559 Vitamin D deficiency, unspecified: Secondary | ICD-10-CM

## 2018-06-14 DIAGNOSIS — F3289 Other specified depressive episodes: Secondary | ICD-10-CM

## 2018-06-14 DIAGNOSIS — Z9189 Other specified personal risk factors, not elsewhere classified: Secondary | ICD-10-CM | POA: Diagnosis not present

## 2018-06-14 DIAGNOSIS — R7303 Prediabetes: Secondary | ICD-10-CM | POA: Diagnosis not present

## 2018-06-14 MED ORDER — VITAMIN D (ERGOCALCIFEROL) 1.25 MG (50000 UNIT) PO CAPS: 50000.0000 [IU] | ORAL_CAPSULE | ORAL | 0 refills | Status: DC

## 2018-06-14 MED ORDER — METFORMIN HCL 500 MG PO TABS: 500.0000 mg | ORAL_TABLET | Freq: Every day | ORAL | 0 refills | Status: DC

## 2018-06-14 MED FILL — metFORMIN HCL 500 MG TABS: 500 | 30 days supply | Qty: 30 | Fill #0

## 2018-06-14 MED FILL — VIT D2 1.25 MG (50,000 UNIT: 1.25 MG | 28 days supply | Qty: 4 | Fill #0

## 2018-06-19 ENCOUNTER — Telehealth: Payer: Self-pay | Admitting: Physical Therapy

## 2018-06-19 ENCOUNTER — Ambulatory Visit: Payer: 59 | Admitting: Physical Therapy

## 2018-06-19 NOTE — Progress Notes (Signed)
Office: [phone]  /  Fax: [phone]   HPI:   Chief Complaint: OBESITY Michelle Huerta is here to discuss her progress with her obesity treatment plan. She just started the Category 3 plan today and is following her eating plan approximately 10 % of the time. She states she is exercising 0 minutes 0 times per week. Michelle Huerta was unable to start the plan yet, and just started today. She has questions about how to use the food scale.  Her weight is 298 lb (135.2 kg) today and has had a weight gain of 2 pounds over a period of 2 weeks since her last visit. She has lost 0 lbs since starting treatment with [place].  Vitamin D deficiency Michelle Huerta has a diagnosis of vitamin D deficiency. She is no longer taking vit D and is not at goal. She admits fatigue and denies nausea, vomiting, or muscle weakness.  Pre-Diabetes Michelle Huerta has a diagnosis of pre-diabetes based on her elevated Hgb A1c and was informed this puts her at greater risk of developing diabetes. Her last A1c was elevated at 6.3 on 05/30/18. She admits polyphagia, which is worse in the evening. She has not started her diet plan yet. She is not taking metformin currently and continues to work on diet and exercise to decrease risk of diabetes.  At risk for diabetes Michelle Huerta is at higher than average risk for developing diabetes due to her pre-diabetes and obesity. She currently denies polyuria or polydipsia.  ALLERGIES: Allergies  Allergen Reactions  . Penicillins Rash and Other (See Comments)    Has patient had a PCN reaction causing immediate rash, facial/tongue/throat swelling, SOB or lightheadedness with hypotension: Yes Has patient had a PCN reaction causing severe rash involving mucus membranes or skin necrosis: No Has patient had a PCN reaction that required hospitalization: No Has patient had a PCN reaction occurring within the last 10 years: No If all of the above answers are "NO", then may proceed with Cephalosporin use.   Michelle Huerta Kitchen Reclast [Zoledronic  Acid] Nausea And Vomiting    MEDICATIONS: Current Outpatient Medications on File Prior to Visit  Medication Sig Dispense Refill  . amLODipine (NORVASC) 5 MG tablet Take 1 tablet (5 mg total) by mouth daily. 90 tablet 3  . cetirizine (ZYRTEC) 10 MG tablet Take 10 mg by mouth daily.    Michelle Huerta Kitchen estradiol (ESTRACE) 0.1 MG/GM vaginal cream Apply 1g 1 to 3 times per week 42.5 g 12  . HYDROcodone-acetaminophen (NORCO/VICODIN) 5-325 MG tablet Take 1-2 tablets by mouth every 6 (six) hours as needed for moderate pain. 30 tablet 0  . hydrOXYzine (ATARAX/VISTARIL) 50 MG tablet Take 1-2 tablets (50-100 mg total) by mouth every 8 (eight) hours as needed for itching. 90 tablet 1  . ibuprofen (ADVIL,MOTRIN) 800 MG tablet Take 1 tablet (800 mg total) by mouth every 8 (eight) hours as needed. 90 tablet 0  . oxyCODONE (ROXICODONE) 5 MG immediate release tablet Take 1-2 tablets (5-10 mg total) by mouth every 4 (four) hours as needed for severe pain. 40 tablet 0  . telmisartan (MICARDIS) 80 MG tablet Take 1 tablet (80 mg total) by mouth daily. 90 tablet 3  . tiZANidine (ZANAFLEX) 4 MG tablet Take 1 tablet (4 mg total) by mouth every 6 (six) hours as needed for muscle spasms. 40 tablet 0  . traZODone (DESYREL) 100 MG tablet Take 1-3 tablets (100-300 mg total) by mouth at bedtime as needed for sleep. 90 tablet 1  . valACYclovir (VALTREX) 500 MG tablet Take 1  tablet (500 mg total) by mouth daily. 90 tablet 3  . zolpidem (AMBIEN) 5 MG tablet Take 1 tablet (5 mg total) by mouth at bedtime as needed for sleep. 15 tablet 1   No current facility-administered medications on file prior to visit.     PAST MEDICAL HISTORY: Past Medical History:  Diagnosis Date  . Allergy   . Arthritis of both knees   . Bilateral chronic knee pain   . Chronic back pain   . Constipation   . DDD (degenerative disc disease), cervical   . Depression    no meds  . Genital herpes    HSV type 2 at rectum, culture positive 3/15  . Glaucoma   .  Grade I diastolic dysfunction 16/04/9603   noted on ECHO   . Heart murmur   . History of colon polyps   . HLD (hyperlipidemia)   . Hypertension   . Hypothyroidism   . Insomnia   . Internal hemorrhoids   . Lactose intolerance   . Lupus ([place])   . LVH (left ventricular hypertrophy) 06/25/2015   Mild, noted on ECHO   . Mild sleep apnea    1/17 CPAP Dr Claiborne Billings does not use cpap   . Morbid obesity ([place]) 06/11/2015  . Osteoporosis   . Paget's disease   . Paget's disease of bone    Dr Trudie Reed, Reclast insufion 02/14/10 - bad reaction to infusion, now tolerates fosamax weekly  . Plantar fasciitis, bilateral   . PMB (postmenopausal bleeding)   . Pre-diabetes   . Sleep apnea   . Thrombocytopenia ([place])   . TR (tricuspid regurgitation) 06/25/2015   Trace, noted on ECHO   . Tubular adenoma polyp of rectum    8/12, Dr Fuller Plan, 5 yr follow up   . Uterine fibroid 10/19/2017   noted on CT pelvis  . Vitamin D deficiency     PAST SURGICAL HISTORY: Past Surgical History:  Procedure Laterality Date  . AUGMENTATION MAMMAPLASTY Bilateral   . BREAST ENHANCEMENT SURGERY    . BREAST REDUCTION SURGERY    . COLONOSCOPY  01/19/2016  . DILATION AND CURETTAGE OF UTERUS N/A 06/24/2015   Procedure: DILATATION AND CURETTAGE;  Surgeon: Emily Filbert, MD;  Location: [facility];  Service: Gynecology;  Laterality: N/A;  . KNEE ARTHROSCOPY WITH SUBCHONDROPLASTY Right 05/03/2018   Procedure: RIGHT KNEE ARTHROSCOPY WITH DEBRIDEMENT, PARTIAL MEDIAL MENISCECTOMY SUBCHONDROPLASTY MEDIAL TIBIAL PLATEAU AND MEDIAL FEMORAL CONDYLE;  Surgeon: Mcarthur Rossetti, MD;  Location: [facility];  Service: Orthopedics;  Laterality: Right;  . REDUCTION MAMMAPLASTY Bilateral   . TOE SURGERY     removal of bone in each foot  . TUBAL LIGATION      SOCIAL HISTORY: Social History   Tobacco Use  . Smoking status: Former Smoker    Years: 7.00    Types: Cigars    Last attempt to quit: 08/11/2008    Years since quitting: 9.8  . Smokeless  tobacco: Never Used  . Tobacco comment: smoked [employer], 1 pack per day (5 in a pack)  Substance Use Topics  . Alcohol use: Yes    Alcohol/week: 5.0 standard drinks    Types: 5 Cans of beer per week    Comment: occ   . Drug use: Never    FAMILY HISTORY: Family History  Problem Relation Age of Onset  . Hypertension Mother   . Heart disease Mother   . Hypertension Father   . Hypertension Sister   . Cancer Maternal Grandmother   .  Stomach cancer Paternal Grandmother   . Hypertension Son   . Hypertension Son   . Colon cancer Neg Hx     ROS: Review of Systems  Constitutional: Positive for malaise/fatigue. Negative for weight loss.  Gastrointestinal: Negative for nausea and vomiting.  Genitourinary:       Negative for polyuria.  Musculoskeletal:       Negative for muscle weakness.  Endo/Heme/Allergies: Negative for polydipsia.       Positive for polyphagia.    PHYSICAL EXAM: Blood pressure 123/82, pulse 76, height 5\' 7"  (1.702 m), weight 298 lb (135.2 kg), last menstrual period 08/01/2012, SpO2 100 %. Body mass index is 46.67 kg/m. Physical Exam  Constitutional: She is oriented to person, place, and time. She appears well-developed and well-nourished.  Cardiovascular: Normal rate.  Pulmonary/Chest: Effort normal.  Musculoskeletal: Normal range of motion.  Neurological: She is oriented to person, place, and time.  Skin: Skin is warm and dry.  Psychiatric: She has a normal mood and affect. Her behavior is normal.  Vitals reviewed.   RECENT LABS AND TESTS: BMET    Component Value Date/Time   NA 139 05/30/2018 1116   K 4.2 05/30/2018 1116   CL 101 05/30/2018 1116   CO2 23 05/30/2018 1116   GLUCOSE 86 05/30/2018 1116   GLUCOSE 104 (H) 04/26/2018 0837   BUN 13 05/30/2018 1116   CREATININE 0.73 05/30/2018 1116   CALCIUM 9.2 05/30/2018 1116   GFRNONAA 93 05/30/2018 1116   GFRAA 107 05/30/2018 1116   Lab Results  Component Value Date   HGBA1C 6.3 (H)  05/30/2018   HGBA1C 6.0 (A) 03/09/2018   Lab Results  Component Value Date   INSULIN 41.4 (H) 05/30/2018   CBC    Component Value Date/Time   WBC 6.4 05/30/2018 1116   WBC 6.0 04/26/2018 0837   RBC 4.57 05/30/2018 1116   RBC 4.71 04/26/2018 0837   HGB 11.6 05/30/2018 1116   HCT 37.0 05/30/2018 1116   PLT 173 05/30/2018 1116   MCV 81 05/30/2018 1116   MCH 25.4 (L) 05/30/2018 1116   MCH 25.9 (L) 04/26/2018 0837   MCHC 31.4 (L) 05/30/2018 1116   MCHC 29.8 (L) 04/26/2018 0837   RDW 13.7 05/30/2018 1116   LYMPHSABS 2.4 05/30/2018 1116   MONOABS 0.4 06/19/2012 0936   EOSABS 0.1 05/30/2018 1116   BASOSABS 0.0 05/30/2018 1116   Iron/TIBC/Ferritin/ %Sat No results found for: IRON, TIBC, FERRITIN, IRONPCTSAT Lipid Panel     Component Value Date/Time   CHOL 134 05/30/2018 1116   TRIG 85 05/30/2018 1116   HDL 75 05/30/2018 1116   CHOLHDL 1.8 05/30/2018 1116   LDLCALC 42 05/30/2018 1116   Hepatic Function Panel     Component Value Date/Time   PROT 7.8 05/30/2018 1116   ALBUMIN 3.9 05/30/2018 1116   AST 29 05/30/2018 1116   ALT 40 (H) 05/30/2018 1116   ALKPHOS 91 05/30/2018 1116   BILITOT 0.5 05/30/2018 1116      Component Value Date/Time   TSH 1.280 05/30/2018 1116   TSH 1.220 04/26/2018 1647   TSH 1.738 04/06/2015 1520   Results for Sepulveda, Willis L (MRN [id number]) as of 06/19/2018 08:54  Ref. Range 05/30/2018 11:16  Vitamin D, 25-Hydroxy Latest Ref Range: 30.0 - 100.0 ng/mL 19.7 (L)   ASSESSMENT AND PLAN: Vitamin D deficiency - Plan: Vitamin D, Ergocalciferol, (DRISDOL) 1.25 MG (50000 UT) CAPS capsule  Prediabetes - Plan: metFORMIN (GLUCOPHAGE) 500 MG tablet  At risk for diabetes  mellitus  Class 3 severe obesity with serious comorbidity and body mass index (BMI) of 45.0 to 49.9 in adult, unspecified obesity type ([place])  PLAN:  Vitamin D Deficiency Michelle Huerta was informed that low vitamin D levels contributes to fatigue and are associated with obesity, breast, and  colon cancer. She agrees to start to take prescription Vit D @50 ,000 IU every week #4 with no refills and will follow up for routine testing of vitamin D, at least 2-3 times per year. She was informed of the risk of over-replacement of vitamin D and agrees to not increase her dose unless she discusses this with [place] first. Michelle Huerta agrees to follow up in 2 weeks.  Pre-Diabetes Shronda will continue to work on weight loss, exercise, and decreasing simple carbohydrates in her diet to help decrease the risk of diabetes. We discussed metformin including benefits and risks. She was informed that eating too many simple carbohydrates or too many calories at one sitting increases the likelihood of GI side effects. Michelle Huerta agreed to start metformin 500mg  qAM #30 with no refills and a prescription was written today. Michelle Huerta agreed to start her diet prescription and follow up with [place] as directed to monitor her progress.  Diabetes risk counseling Michelle Huerta was given extended (30 minutes) diabetes prevention counseling today. She is 57 y.o. female and has risk factors for diabetes including pre-diabetes and obesity. We discussed intensive lifestyle modifications today with an emphasis on weight loss as well as increasing exercise and decreasing simple carbohydrates in her diet.  Obesity Michelle Huerta is currently in the action stage of change. As such, her goal is to continue with weight loss efforts. She has agreed to follow the Category 3 plan. Michelle Huerta was shown how to use the food scale. Michelle Huerta has been instructed to work up to a goal of 150 minutes of combined cardio and strengthening exercise per week for weight loss and overall health benefits. We discussed the following Behavioral Modification Strategies today: increasing lean protein intake, decreasing simple carbohydrates, decrease eating out, work on meal planning and easy cooking plans, holiday eating strategies, and celebration eating strategies.  Michelle Huerta has agreed to follow up with our  clinic in 2 weeks. She was informed of the importance of frequent follow up visits to maximize her success with intensive lifestyle modifications for her multiple health conditions.   OBESITY BEHAVIORAL INTERVENTION VISIT  Today's visit was # 2   Starting weight: 296 lbs Starting date: 05/30/18 Today's weight : Weight: 298 lb (135.2 kg)  Today's date: 06/14/2018 Total lbs lost to date: 0  ASK: We discussed the diagnosis of obesity with Michelle Huerta today and Michelle Huerta agreed to give [place] permission to discuss obesity behavioral modification therapy today.  ASSESS: Michelle Huerta has the diagnosis of obesity and her BMI today is 46.6. Michelle Huerta is in the action stage of change.   ADVISE: Michelle Huerta was educated on the multiple health risks of obesity as well as the benefit of weight loss to improve her health. She was advised of the need for long term treatment and the importance of lifestyle modifications to improve her current health and to decrease her risk of future health problems.  AGREE: Multiple dietary modification options and treatment options were discussed and Michelle Huerta agreed to follow the recommendations documented in the above note.  ARRANGE: Michelle Huerta was educated on the importance of frequent visits to treat obesity as outlined per CMS and USPSTF guidelines and agreed to schedule her next follow up appointment today.  I, Marcille Blanco,  am acting as transcriptionist for Starlyn Skeans, MD  I have reviewed the above documentation for accuracy and completeness, and I agree with the above. -Dennard Nip, MD

## 2018-06-19 NOTE — Telephone Encounter (Signed)
Spoke with patient regarding no-show visit. The patient reports she over slept and will be at her appointment on 06/11/2018.

## 2018-06-21 ENCOUNTER — Encounter: Payer: Self-pay | Admitting: Physical Therapy

## 2018-06-21 ENCOUNTER — Ambulatory Visit: Payer: 59 | Admitting: Physical Therapy

## 2018-06-21 DIAGNOSIS — M25561 Pain in right knee: Secondary | ICD-10-CM | POA: Diagnosis not present

## 2018-06-21 DIAGNOSIS — M25661 Stiffness of right knee, not elsewhere classified: Secondary | ICD-10-CM

## 2018-06-21 DIAGNOSIS — R262 Difficulty in walking, not elsewhere classified: Secondary | ICD-10-CM

## 2018-06-21 DIAGNOSIS — M6281 Muscle weakness (generalized): Secondary | ICD-10-CM | POA: Diagnosis not present

## 2018-06-21 NOTE — Therapy (Signed)
[place], [place], [address] Phone: [phone]   Fax:  [phone]  Physical Therapy Treatment  Patient Details  Name: Michelle Huerta MRN: [id number] Date of Birth: [birth year] Referring Provider (PT): Santiago Bumpers    Encounter Date: 06/21/2018  PT End of Session - 06/21/18 1321    Visit Number  5    Number of Visits  12    Date for PT Re-Evaluation  07/04/18    Authorization Type  [facility]    PT Start Time  1975    PT Stop Time  0928    PT Time Calculation (min)  40 min    Activity Tolerance  Patient tolerated treatment well    Behavior During Therapy  [name] for tasks assessed/performed       Past Medical History:  Diagnosis Date  . Allergy   . Arthritis of both knees   . Bilateral chronic knee pain   . Chronic back pain   . Constipation   . DDD (degenerative disc disease), cervical   . Depression    no meds  . Genital herpes    HSV type 2 at rectum, culture positive 3/15  . Glaucoma   . Grade I diastolic dysfunction 88/32/5498   noted on ECHO   . Heart murmur   . History of colon polyps   . HLD (hyperlipidemia)   . Hypertension   . Hypothyroidism   . Insomnia   . Internal hemorrhoids   . Lactose intolerance   . Lupus ([place])   . LVH (left ventricular hypertrophy) 06/25/2015   Mild, noted on ECHO   . Mild sleep apnea    1/17 CPAP Dr Claiborne Billings does not use cpap   . Morbid obesity ([place]) 06/11/2015  . Osteoporosis   . Paget's disease   . Paget's disease of bone    Dr Trudie Reed, Reclast insufion 02/14/10 - bad reaction to infusion, now tolerates fosamax weekly  . Plantar fasciitis, bilateral   . PMB (postmenopausal bleeding)   . Pre-diabetes   . Sleep apnea   . Thrombocytopenia ([place])   . TR (tricuspid regurgitation) 06/25/2015   Trace, noted on ECHO   . Tubular adenoma polyp of rectum    8/12, Dr Fuller Plan, 5 yr follow up   . Uterine fibroid 10/19/2017   noted on CT pelvis  .  Vitamin D deficiency     Past Surgical History:  Procedure Laterality Date  . AUGMENTATION MAMMAPLASTY Bilateral   . BREAST ENHANCEMENT SURGERY    . BREAST REDUCTION SURGERY    . COLONOSCOPY  01/19/2016  . DILATION AND CURETTAGE OF UTERUS N/A 06/24/2015   Procedure: DILATATION AND CURETTAGE;  Surgeon: Emily Filbert, MD;  Location: [facility];  Service: Gynecology;  Laterality: N/A;  . KNEE ARTHROSCOPY WITH SUBCHONDROPLASTY Right 05/03/2018   Procedure: RIGHT KNEE ARTHROSCOPY WITH DEBRIDEMENT, PARTIAL MEDIAL MENISCECTOMY SUBCHONDROPLASTY MEDIAL TIBIAL PLATEAU AND MEDIAL FEMORAL CONDYLE;  Surgeon: Mcarthur Rossetti, MD;  Location: [facility];  Service: Orthopedics;  Laterality: Right;  . REDUCTION MAMMAPLASTY Bilateral   . TOE SURGERY     removal of bone in each foot  . TUBAL LIGATION      There were no vitals filed for this visit.  Subjective Assessment - 06/21/18 0854    Subjective  Patient reports her knee continues to be sore. She is having most of her pain when she is transfering from sit to stand. She is still having difficulty transfering from  sit to stand. She is having the gel shots Monday at 2:30. She will make sure it is OK to do therapy on Wednesday with therapy.    Pertinent History  Paget's Disease, Discoid Lupus     Limitations  Sitting;Standing;Walking    How long can you sit comfortably?  30 minutes     How long can you walk comfortably?  not walking too far right now before needs rest     Diagnostic tests  Xray showing Medial right knee space narrowing     Patient Stated Goals  Improve pain to get back to work     Currently in Pain?  Yes    Pain Score  3     Pain Location  Knee    Pain Orientation  Right    Pain Descriptors / Indicators  Aching;Sore    Pain Type  Surgical pain    Pain Onset  1 to 4 weeks ago    Pain Frequency  Intermittent    Aggravating Factors   standing and sitting     Pain Relieving Factors  rest     Effect of Pain on Daily Activities  decreased  mobility                        [facility] Adult PT Treatment/Exercise - 06/21/18 0001      Therapeutic Activites    Therapeutic Activities  Other Therapeutic Activities    Other Therapeutic Activities  sit to stand 49 cm and 46 cm and 44 cm       Knee/Hip Exercises: Stretches   Passive Hamstring Stretch  Right;3 reps;30 seconds    Quad Stretch Limitations  seated with strap      Knee/Hip Exercises: Standing   Heel Raises Limitations  2x10     Hip Flexion Limitations  standing march x10     Abduction Limitations  x10 right     Extension Limitations  x10 right       Knee/Hip Exercises: Supine   Quad Sets  10 reps;Limitations    Short Arc Quad Sets  Strengthening;15 reps    Heel Slides  AAROM;Strengthening;Both;1 set;10 reps    Bridges with Ball Squeeze  15 reps    Straight Leg Raises  Right;2 sets;10 reps    Patellar Mobs  assessed but no limitation today    Other Supine Knee/Hip Exercises  hamstring sets x 15 holding 5 seconds             PT Education - 06/21/18 1321    Education Details  reviewed standing ther-ex     Person(s) Educated  Patient    Methods  Explanation;Demonstration;Tactile cues;Verbal cues    Comprehension  Verbalized understanding;Returned demonstration;Verbal cues required;Tactile cues required       PT Short Term Goals - 06/21/18 1619      PT SHORT TERM GOAL #1   Title  Pt will improve R knee flexion by 10 degrees     Baseline  not tested     Time  3    Period  Weeks    Status  On-going      PT SHORT TERM GOAL #2   Title  Pt will improve R knee flexor and extensor strength to 4+/5     Time  3    Period  Weeks    Status  On-going      PT SHORT TERM GOAL #3   Title  Pt will report <2/10 pain  with sit<>Stands     Time  3    Period  Weeks    Status  On-going        PT Long Term Goals - 06/04/18 1309      PT LONG TERM GOAL #1   Title  Pt will be able to sit or stand >1 hour without increased knee pain in order to  perform ADLs     Time  6    Period  Weeks    Status  New      PT LONG TERM GOAL #2   Title  Pt will improve R knee strength to 5/5 in order to improve gait and mobility to return to work.     Baseline  met today 44%     Time  6    Period  Weeks    Status  New      PT LONG TERM GOAL #3   Title  Pt will decreased right knee edema by 2cm in order to improve overall knee mobility     Baseline  not yet 25%    Time  6    Period  Weeks      PT LONG TERM GOAL #4   Title  Pt will be able to ascend and descend stairs with 1 rail and good knee control, pain no more than minimal     Baseline  still pain and difficulty with stairs    Time  6    Period  Weeks    Status  On-going      PT LONG TERM GOAL #5   Title  Pt will increase hip strength to 4+/5 or better for improved knee stability with gait mechanics.     Time  6    Period  Weeks    Status  On-going            Plan - 06/21/18 1323    Clinical Impression Statement  Despite right knee pain the patient did well with therapy today. She was able to tolerate standing exercises with some cuing for technique. She is going to have a shot on Monday. She was advised to ask the     Clinical Presentation  Stable    Rehab Potential  Good    PT Frequency  2x / week    PT Duration  6 weeks    PT Treatment/Interventions  ADLs/Self Care Home Management;Cryotherapy;Ultrasound;Moist Heat;Iontophoresis 69m/ml Dexamethasone;Electrical Stimulation;DME Instruction;Gait training;Stair training;Balance training;Therapeutic exercise;Therapeutic activities;Functional mobility training;Neuromuscular re-education;Patient/family education;Manual techniques;Passive range of motion;Taping;Vasopneumatic Device;Scar mobilization;Joint Manipulations    PT Next Visit Plan  continue to work on strengthening. Make sure Dr BNinfa Lindenfelt it was OK to do therapy after her shot     PT Home Exercise Plan  seated hamstring stretch, knee flexion stretch, quad sets, SAQ,  and SLR, bridge    Consulted and Agree with Plan of Care  Patient       Patient will benefit from skilled therapeutic intervention in order to improve the following deficits and impairments:  Decreased balance, Decreased endurance, Decreased mobility, Difficulty walking, Hypomobility, Pain, Postural dysfunction, Impaired flexibility, Increased fascial restricitons, Decreased strength, Obesity, Decreased range of motion, Abnormal gait  Visit Diagnosis: Acute pain of right knee  Stiffness of right knee, not elsewhere classified  Muscle weakness (generalized)  Difficulty in walking, not elsewhere classified     Problem List Patient Active Problem List   Diagnosis Date Noted  . Status post arthroscopic surgery of right knee 05/09/2018  .  Chronic pain of right knee 05/09/2018  . Closed fracture of right tibial plateau   . Unilateral primary osteoarthritis, right knee 04/23/2018  . Acute medial meniscus tear of right knee 04/23/2018  . Closed fracture of medial plateau of left tibia 04/23/2018  . Severe obesity (BMI >= 40) ([place]) 04/23/2018  . Osteoarthritis of right knee 03/09/2018  . Prediabetes 03/09/2018  . Bilateral knee pain 12/28/2017  . Constipation 12/18/2017  . Cough 12/15/2017  . Lower resp. tract infection 12/15/2017  . Acute conjunctivitis of right eye 12/15/2017  . H/O Paget's disease of bone 09/10/2017  . OSA on CPAP 10/28/2015  . Fibroids 08/07/2015  . Plantar fasciitis, bilateral 08/03/2015  . Metatarsal deformity 08/03/2015  . Equinus deformity of foot, acquired 08/03/2015  . Pronation deformity of both feet 08/03/2015  . Morbid obesity ([place]) 06/11/2015  . 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 Essential (primary) hypertension 06/11/2015  . 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 Benign essential HTN 09/19/2013  . Major depressive episode 09/19/2013    Carney Living PT DPT  06/21/2018, 4:23 PM  [facility] [address] [place], [place], [address] Phone: [phone]   Fax:  [phone]  Name: Michelle Huerta MRN: [id number] Date of Birth: 1960/09/24

## 2018-06-25 ENCOUNTER — Ambulatory Visit (INDEPENDENT_AMBULATORY_CARE_PROVIDER_SITE_OTHER): Payer: 59 | Admitting: Orthopaedic Surgery

## 2018-06-25 ENCOUNTER — Encounter: Payer: Self-pay | Admitting: Physical Therapy

## 2018-06-25 ENCOUNTER — Encounter (INDEPENDENT_AMBULATORY_CARE_PROVIDER_SITE_OTHER): Payer: Self-pay | Admitting: Orthopaedic Surgery

## 2018-06-25 ENCOUNTER — Ambulatory Visit: Payer: 59 | Admitting: Physical Therapy

## 2018-06-25 DIAGNOSIS — M1711 Unilateral primary osteoarthritis, right knee: Secondary | ICD-10-CM | POA: Diagnosis not present

## 2018-06-25 DIAGNOSIS — R262 Difficulty in walking, not elsewhere classified: Secondary | ICD-10-CM | POA: Diagnosis not present

## 2018-06-25 DIAGNOSIS — M25661 Stiffness of right knee, not elsewhere classified: Secondary | ICD-10-CM | POA: Diagnosis not present

## 2018-06-25 DIAGNOSIS — M6281 Muscle weakness (generalized): Secondary | ICD-10-CM

## 2018-06-25 DIAGNOSIS — M25561 Pain in right knee: Secondary | ICD-10-CM

## 2018-06-25 MED ORDER — HYALURONAN 88 MG/4ML IX SOSY
88.0000 mg | PREFILLED_SYRINGE | INTRA_ARTICULAR | Status: AC | PRN
  Administered 2018-06-25: 88 mg via INTRA_ARTICULAR

## 2018-06-25 NOTE — Progress Notes (Signed)
   Procedure Note  Patient: Michelle Huerta             Date of Birth: [birth year]           MRN: [id number]             Visit Date: 06/25/2018  Procedures: Visit Diagnoses: Unilateral primary osteoarthritis, right knee  Large Joint Inj on 06/25/2018 3:01 PM Indications: diagnostic evaluation and pain Details: 22 G 1.5 in needle, superolateral approach  Arthrogram: No  Medications: 88 mg Hyaluronan 88 MG/4ML Outcome: tolerated well, no immediate complications Procedure, treatment alternatives, risks and benefits explained, specific risks discussed. Consent was given by the patient. Immediately prior to procedure a time out was called to verify the correct patient, procedure, equipment, support staff and site/side marked as required. Patient was prepped and draped in the usual sterile fashion.    The patient is here today for scheduled Monovisc injection with hyaluronic acid to treat the pain of arthritis of her right knee.  We are trying to stay as conservative as possible.  We note that she has medial compartment arthritic changes.  She is in recovery from a knee arthroscopy and a sub-chondroplasty of the medial femoral condyle and the medial tibial plateau.  Her pain is still daily.  It is kept her out of work.  She is in physical therapy as well.  She is someone with a BMI of over 42 we will try to get her lose weight to be able to consider knee replacement surgery.  She understands why she is having this injection today and tolerated well.  All question concerns were answered and addressed.  We will see her back in 6 weeks to see how she is doing overall.  Unfortunately we will need to keep her out of work till least February 3 to allow her to continue to rest her knee and get to physical therapy and try to lose weight to take pressure off of that knee.  At her next visit I would like her weighed.

## 2018-06-25 NOTE — Therapy (Signed)
[place], [place], [address] Phone: [phone]   Fax:  [phone]  Physical Therapy Treatment  Patient Details  Name: Michelle Huerta MRN: [id number] Date of Birth: [birth year] Referring Provider (PT): Santiago Bumpers    Encounter Date: 06/25/2018  PT End of Session - 06/25/18 0920    Visit Number  6    Number of Visits  12    Date for PT Re-Evaluation  07/04/18    Authorization Type  [facility]    PT Start Time  0272    PT Stop Time  0927    PT Time Calculation (min)  39 min    Activity Tolerance  Patient tolerated treatment well    Behavior During Therapy  [facility] for tasks assessed/performed       Past Medical History:  Diagnosis Date  . Allergy   . Arthritis of both knees   . Bilateral chronic knee pain   . Chronic back pain   . Constipation   . DDD (degenerative disc disease), cervical   . Depression    no meds  . Genital herpes    HSV type 2 at rectum, culture positive 3/15  . Glaucoma   . Grade I diastolic dysfunction 53/66/4403   noted on ECHO   . Heart murmur   . History of colon polyps   . HLD (hyperlipidemia)   . Hypertension   . Hypothyroidism   . Insomnia   . Internal hemorrhoids   . Lactose intolerance   . Lupus ([place])   . LVH (left ventricular hypertrophy) 06/25/2015   Mild, noted on ECHO   . Mild sleep apnea    1/17 CPAP Dr Claiborne Billings does not use cpap   . Morbid obesity ([place]) 06/11/2015  . Osteoporosis   . Paget's disease   . Paget's disease of bone    Dr Trudie Reed, Reclast insufion 02/14/10 - bad reaction to infusion, now tolerates fosamax weekly  . Plantar fasciitis, bilateral   . PMB (postmenopausal bleeding)   . Pre-diabetes   . Sleep apnea   . Thrombocytopenia ([place])   . TR (tricuspid regurgitation) 06/25/2015   Trace, noted on ECHO   . Tubular adenoma polyp of rectum    8/12, Dr Fuller Plan, 5 yr follow up   . Uterine fibroid 10/19/2017   noted on CT pelvis  .  Vitamin D deficiency     Past Surgical History:  Procedure Laterality Date  . AUGMENTATION MAMMAPLASTY Bilateral   . BREAST ENHANCEMENT SURGERY    . BREAST REDUCTION SURGERY    . COLONOSCOPY  01/19/2016  . DILATION AND CURETTAGE OF UTERUS N/A 06/24/2015   Procedure: DILATATION AND CURETTAGE;  Surgeon: Emily Filbert, MD;  Location: [facility];  Service: Gynecology;  Laterality: N/A;  . KNEE ARTHROSCOPY WITH SUBCHONDROPLASTY Right 05/03/2018   Procedure: RIGHT KNEE ARTHROSCOPY WITH DEBRIDEMENT, PARTIAL MEDIAL MENISCECTOMY SUBCHONDROPLASTY MEDIAL TIBIAL PLATEAU AND MEDIAL FEMORAL CONDYLE;  Surgeon: Mcarthur Rossetti, MD;  Location: [facility];  Service: Orthopedics;  Laterality: Right;  . REDUCTION MAMMAPLASTY Bilateral   . TOE SURGERY     removal of bone in each foot  . TUBAL LIGATION      There were no vitals filed for this visit.  Subjective Assessment - 06/25/18 0859    Subjective  Patient reports 1 incedent were she felt like her knee was going to buckle this weekend but other wise it has been doing OK. She is having bery little pain  this morning.     Pertinent History  Paget's Disease, Discoid Lupus     Limitations  Sitting;Standing;Walking    How long can you sit comfortably?  30 minutes     How long can you walk comfortably?  not walking too far right now before needs rest     Diagnostic tests  Xray showing Medial right knee space narrowing     Patient Stated Goals  Improve pain to get back to work     Currently in Pain?  Yes    Pain Score  1     Pain Location  Knee    Pain Orientation  Right    Pain Descriptors / Indicators  Aching;Sore    Pain Type  Surgical pain    Pain Onset  1 to 4 weeks ago    Pain Frequency  Intermittent    Aggravating Factors   standing and sitting     Pain Relieving Factors  rest     Effect of Pain on Daily Activities  decreased mobility                        [facility] Adult PT Treatment/Exercise - 06/25/18 0001      Lumbar  Exercises: Stretches   Active Hamstring Stretch  3 reps;20 seconds;Right    Other Lumbar Stretch Exercise  reviewed Thomas stretch 3x20 sec hold       Knee/Hip Exercises: Standing   Heel Raises Limitations  2x10     Hip Flexion Limitations  standing march x10     Abduction Limitations  x10 right     Extension Limitations  x10 right       Knee/Hip Exercises: Supine   Quad Sets  10 reps;Limitations    Quad Sets Limitations  5 sec hold     Short Arc Quad Sets  2 sets;15 reps    Heel Slides  AAROM;2 sets;10 reps    Bridges Limitations  15    Straight Leg Raises  Right;2 sets;10 reps    Other Supine Knee/Hip Exercises  clam shell red x20              PT Education - 06/25/18 0920    Education Details  improtance of strengthening     Person(s) Educated  Patient    Methods  Explanation;Demonstration;Tactile cues;Verbal cues    Comprehension  Verbalized understanding;Returned demonstration;Verbal cues required       PT Short Term Goals - 06/25/18 1122      PT SHORT TERM GOAL #1   Title  Pt will improve R knee flexion by 10 degrees     Baseline  not tested     Time  3    Period  Weeks    Status  On-going      PT SHORT TERM GOAL #2   Title  Pt will improve R knee flexor and extensor strength to 4+/5     Time  3    Period  Weeks    Status  On-going      PT SHORT TERM GOAL #3   Title  Pt will report <2/10 pain with sit<>Stands     Time  3    Period  Weeks    Status  On-going        PT Long Term Goals - 06/04/18 1309      PT LONG TERM GOAL #1   Title  Pt will be able to sit or stand >1 hour  without increased knee pain in order to perform ADLs     Time  6    Period  Weeks    Status  New      PT LONG TERM GOAL #2   Title  Pt will improve R knee strength to 5/5 in order to improve gait and mobility to return to work.     Baseline  met today 44%     Time  6    Period  Weeks    Status  New      PT LONG TERM GOAL #3   Title  Pt will decreased right knee edema by  2cm in order to improve overall knee mobility     Baseline  not yet 25%    Time  6    Period  Weeks      PT LONG TERM GOAL #4   Title  Pt will be able to ascend and descend stairs with 1 rail and good knee control, pain no more than minimal     Baseline  still pain and difficulty with stairs    Time  6    Period  Weeks    Status  On-going      PT LONG TERM GOAL #5   Title  Pt will increase hip strength to 4+/5 or better for improved knee stability with gait mechanics.     Time  6    Period  Weeks    Status  On-going            Plan - 06/25/18 9798    Clinical Impression Statement  Patient tolerated treatment well. She had no significant increase in pain with treatment. She did report some fatigue. She was encouraged to continue her exercises at home as well.  She will have an injection today. She was enouraged     History and Personal Factors relevant to plan of care:  Lupus, Pagets disease     Clinical Presentation  Stable    Clinical Decision Making  Low    Rehab Potential  Good    PT Frequency  2x / week    PT Duration  6 weeks    PT Treatment/Interventions  ADLs/Self Care Home Management;Cryotherapy;Ultrasound;Moist Heat;Iontophoresis 30m/ml Dexamethasone;Electrical Stimulation;DME Instruction;Gait training;Stair training;Balance training;Therapeutic exercise;Therapeutic activities;Functional mobility training;Neuromuscular re-education;Patient/family education;Manual techniques;Passive range of motion;Taping;Vasopneumatic Device;Scar mobilization;Joint Manipulations    PT Next Visit Plan  continue to work on strengthening. Make sure Dr BNinfa Lindenfelt it was OK to do therapy after her shot. add low stairs; consdier squats     PT Home Exercise Plan  seated hamstring stretch, knee flexion stretch, quad sets, SAQ, and SLR, bridge    Consulted and Agree with Plan of Care  Patient       Patient will benefit from skilled therapeutic intervention in order to improve the following  deficits and impairments:  Decreased balance, Decreased endurance, Decreased mobility, Difficulty walking, Hypomobility, Pain, Postural dysfunction, Impaired flexibility, Increased fascial restricitons, Decreased strength, Obesity, Decreased range of motion, Abnormal gait  Visit Diagnosis: Acute pain of right knee  Stiffness of right knee, not elsewhere classified  Muscle weakness (generalized)  Difficulty in walking, not elsewhere classified     Problem List Patient Active Problem List   Diagnosis Date Noted  . Status post arthroscopic surgery of right knee 05/09/2018  . Chronic pain of right knee 05/09/2018  . Closed fracture of right tibial plateau   . Unilateral primary osteoarthritis, right knee 04/23/2018  . Acute medial meniscus  tear of right knee 04/23/2018  . Closed fracture of medial plateau of left tibia 04/23/2018  . Severe obesity (BMI >= 40) ([place]) 04/23/2018  . Osteoarthritis of right knee 03/09/2018  . Prediabetes 03/09/2018  . Bilateral knee pain 12/28/2017  . Constipation 12/18/2017  . Cough 12/15/2017  . Lower resp. tract infection 12/15/2017  . Acute conjunctivitis of right eye 12/15/2017  . H/O Paget's disease of bone 09/10/2017  . OSA on CPAP 10/28/2015  . Fibroids 08/07/2015  . Plantar fasciitis, bilateral 08/03/2015  . Metatarsal deformity 08/03/2015  . Equinus deformity of foot, acquired 08/03/2015  . Pronation deformity of both feet 08/03/2015  . Morbid obesity ([place]) 06/11/2015  . 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 Essential (primary) hypertension 06/11/2015  . 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 Benign essential HTN 09/19/2013  . Major depressive episode 09/19/2013    Carney Living PT DPT  06/25/2018, 11:33 AM  [facility] [address] [place], [place], [address] Phone: [phone]   Fax:  [phone]  Name: Michelle Huerta MRN: [id number] Date of Birth: 08-23-1960

## 2018-06-27 ENCOUNTER — Encounter: Payer: Self-pay | Admitting: Physical Therapy

## 2018-06-27 ENCOUNTER — Ambulatory Visit: Payer: 59 | Admitting: Physical Therapy

## 2018-06-27 DIAGNOSIS — M6281 Muscle weakness (generalized): Secondary | ICD-10-CM

## 2018-06-27 DIAGNOSIS — M25661 Stiffness of right knee, not elsewhere classified: Secondary | ICD-10-CM

## 2018-06-27 DIAGNOSIS — M25561 Pain in right knee: Secondary | ICD-10-CM

## 2018-06-27 DIAGNOSIS — R262 Difficulty in walking, not elsewhere classified: Secondary | ICD-10-CM

## 2018-06-27 NOTE — Therapy (Signed)
[place], [place], [address] Phone: [phone]   Fax:  [phone]  Physical Therapy Treatment  Patient Details  Name: Michelle Huerta MRN: [id number] Date of Birth: April 27, 1961 Referring Provider (PT): Santiago Bumpers    Encounter Date: 06/27/2018  PT End of Session - 06/27/18 0940    Visit Number  7    Number of Visits  12    Date for PT Re-Evaluation  07/04/18    Authorization Type  [facility]    PT Start Time  9563    PT Stop Time  1012    PT Time Calculation (min)  38 min    Activity Tolerance  Patient tolerated treatment well    Behavior During Therapy  [facility] for tasks assessed/performed       Past Medical History:  Diagnosis Date  . Allergy   . Arthritis of both knees   . Bilateral chronic knee pain   . Chronic back pain   . Constipation   . DDD (degenerative disc disease), cervical   . Depression    no meds  . Genital herpes    HSV type 2 at rectum, culture positive 3/15  . Glaucoma   . Grade I diastolic dysfunction 87/56/4332   noted on ECHO   . Heart murmur   . History of colon polyps   . HLD (hyperlipidemia)   . Hypertension   . Hypothyroidism   . Insomnia   . Internal hemorrhoids   . Lactose intolerance   . Lupus ([place])   . LVH (left ventricular hypertrophy) 06/25/2015   Mild, noted on ECHO   . Mild sleep apnea    1/17 CPAP Dr Claiborne Billings does not use cpap   . Morbid obesity ([place]) 06/11/2015  . Osteoporosis   . Paget's disease   . Paget's disease of bone    Dr Trudie Reed, Reclast insufion 02/14/10 - bad reaction to infusion, now tolerates fosamax weekly  . Plantar fasciitis, bilateral   . PMB (postmenopausal bleeding)   . Pre-diabetes   . Sleep apnea   . Thrombocytopenia ([place])   . TR (tricuspid regurgitation) 06/25/2015   Trace, noted on ECHO   . Tubular adenoma polyp of rectum    8/12, Dr Fuller Plan, 5 yr follow up   . Uterine fibroid 10/19/2017   noted on CT pelvis  .  Vitamin D deficiency     Past Surgical History:  Procedure Laterality Date  . AUGMENTATION MAMMAPLASTY Bilateral   . BREAST ENHANCEMENT SURGERY    . BREAST REDUCTION SURGERY    . COLONOSCOPY  01/19/2016  . DILATION AND CURETTAGE OF UTERUS N/A 06/24/2015   Procedure: DILATATION AND CURETTAGE;  Surgeon: Emily Filbert, MD;  Location: [facility];  Service: Gynecology;  Laterality: N/A;  . KNEE ARTHROSCOPY WITH SUBCHONDROPLASTY Right 05/03/2018   Procedure: RIGHT KNEE ARTHROSCOPY WITH DEBRIDEMENT, PARTIAL MEDIAL MENISCECTOMY SUBCHONDROPLASTY MEDIAL TIBIAL PLATEAU AND MEDIAL FEMORAL CONDYLE;  Surgeon: Mcarthur Rossetti, MD;  Location: [facility];  Service: Orthopedics;  Laterality: Right;  . REDUCTION MAMMAPLASTY Bilateral   . TOE SURGERY     removal of bone in each foot  . TUBAL LIGATION      There were no vitals filed for this visit.  Subjective Assessment - 06/27/18 0940    Subjective  When I am walking I can still feel stiffness or soreness. I am okay until I know I am going to get up and walk.  [place] Adult PT Treatment/Exercise - 06/27/18 0001      Knee/Hip Exercises: Stretches   Passive Hamstring Stretch Limitations  seated 2x30 each    Gastroc Stretch Limitations  2*30 slant board      Knee/Hip Exercises: Aerobic   Nustep  5 min L3 LE only      Knee/Hip Exercises: Standing   Lateral Step Up Limitations  2 in step with UE support    Forward Step Up Limitations  2 in step with UE support    Other Standing Knee Exercises  lateral step and return red tband at knees    Other Standing Knee Exercises  postural sway      Knee/Hip Exercises: Seated   Long Arc Quad  Both;15 reps    Long Arc Quad Limitations  ball bw knees               PT Short Term Goals - 06/25/18 1122      PT SHORT TERM GOAL #1   Title  Pt will improve R knee flexion by 10 degrees     Baseline  not tested     Time  3    Period  Weeks    Status  On-going       PT SHORT TERM GOAL #2   Title  Pt will improve R knee flexor and extensor strength to 4+/5     Time  3    Period  Weeks    Status  On-going      PT SHORT TERM GOAL #3   Title  Pt will report <2/10 pain with sit<>Stands     Time  3    Period  Weeks    Status  On-going        PT Long Term Goals - 06/04/18 1309      PT LONG TERM GOAL #1   Title  Pt will be able to sit or stand >1 hour without increased knee pain in order to perform ADLs     Time  6    Period  Weeks    Status  New      PT LONG TERM GOAL #2   Title  Pt will improve R knee strength to 5/5 in order to improve gait and mobility to return to work.     Baseline  met today 44%     Time  6    Period  Weeks    Status  New      PT LONG TERM GOAL #3   Title  Pt will decreased right knee edema by 2cm in order to improve overall knee mobility     Baseline  not yet 25%    Time  6    Period  Weeks      PT LONG TERM GOAL #4   Title  Pt will be able to ascend and descend stairs with 1 rail and good knee control, pain no more than minimal     Baseline  still pain and difficulty with stairs    Time  6    Period  Weeks    Status  On-going      PT LONG TERM GOAL #5   Title  Pt will increase hip strength to 4+/5 or better for improved knee stability with gait mechanics.     Time  6    Period  Weeks    Status  On-going            Plan -  06/27/18 1001    Clinical Impression Statement  Good tolerance to strentgthening today. began step training with 2 in step. notable fatigue with repetitions. tends to ambulate with flat foot and shuffle but is wearing slippers that do not go around her heel.     PT Treatment/Interventions  ADLs/Self Care Home Management;Cryotherapy;Ultrasound;Moist Heat;Iontophoresis 81m/ml Dexamethasone;Electrical Stimulation;DME Instruction;Gait training;Stair training;Balance training;Therapeutic exercise;Therapeutic activities;Functional mobility training;Neuromuscular re-education;Patient/family  education;Manual techniques;Passive range of motion;Taping;Vasopneumatic Device;Scar mobilization;Joint Manipulations    PT Next Visit Plan  progress step height if tolerated, asked her to bring shoes    PT Home Exercise Plan  seated hamstring stretch, knee flexion stretch, quad sets, SAQ, and SLR, bridge, mini wall squat    Consulted and Agree with Plan of Care  Patient       Patient will benefit from skilled therapeutic intervention in order to improve the following deficits and impairments:  Decreased balance, Decreased endurance, Decreased mobility, Difficulty walking, Hypomobility, Pain, Postural dysfunction, Impaired flexibility, Increased fascial restricitons, Decreased strength, Obesity, Decreased range of motion, Abnormal gait  Visit Diagnosis: Acute pain of right knee  Stiffness of right knee, not elsewhere classified  Muscle weakness (generalized)  Difficulty in walking, not elsewhere classified     Problem List Patient Active Problem List   Diagnosis Date Noted  . Status post arthroscopic surgery of right knee 05/09/2018  . Chronic pain of right knee 05/09/2018  . Closed fracture of right tibial plateau   . Unilateral primary osteoarthritis, right knee 04/23/2018  . Acute medial meniscus tear of right knee 04/23/2018  . Closed fracture of medial plateau of left tibia 04/23/2018  . [place] (BMI >= 40) [place] 04/23/2018  . Osteoarthritis of right knee 03/09/2018  . Prediabetes 03/09/2018  . Bilateral knee pain 12/28/2017  . Constipation 12/18/2017  . Cough 12/15/2017  . Lower resp. tract infection 12/15/2017  . Acute conjunctivitis of right eye 12/15/2017  . H/O Paget's disease of bone 09/10/2017  . OSA on CPAP 10/28/2015  . Fibroids 08/07/2015  . Plantar fasciitis, bilateral 08/03/2015  . Metatarsal deformity 08/03/2015  . Equinus deformity of foot, acquired 08/03/2015  . Pronation deformity of both feet 08/03/2015  . Morbid [place] 06/11/2015  .  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 Essential (primary) hypertension 06/11/2015  . 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 Benign essential HTN 09/19/2013  . Major depressive episode 09/19/2013   Brookelynne Dimperio C. Janisa Labus PT, DPT 06/27/18 10:13 AM   [facility] [address]Phone: [phone]  Fax:  [phone] Name: REXILDA WILHITEMRN: [id number] of Birth: [birth year]

## 2018-06-28 ENCOUNTER — Ambulatory Visit (INDEPENDENT_AMBULATORY_CARE_PROVIDER_SITE_OTHER): Payer: 59 | Admitting: Family Medicine

## 2018-06-28 ENCOUNTER — Encounter (INDEPENDENT_AMBULATORY_CARE_PROVIDER_SITE_OTHER): Payer: Self-pay | Admitting: Family Medicine

## 2018-06-28 VITALS — BP 148/77 | HR 68 | Temp 97.9°F | Ht 67.0 in | Wt 297.0 lb

## 2018-06-28 DIAGNOSIS — E559 Vitamin D deficiency, unspecified: Secondary | ICD-10-CM

## 2018-06-28 DIAGNOSIS — Z9189 Other specified personal risk factors, not elsewhere classified: Secondary | ICD-10-CM | POA: Diagnosis not present

## 2018-06-28 DIAGNOSIS — R7303 Prediabetes: Secondary | ICD-10-CM

## 2018-06-28 DIAGNOSIS — I10 Essential (primary) hypertension: Secondary | ICD-10-CM | POA: Diagnosis not present

## 2018-06-28 DIAGNOSIS — Z6841 Body Mass Index (BMI) 40.0 and over, adult: Secondary | ICD-10-CM | POA: Diagnosis not present

## 2018-06-28 MED ORDER — VITAMIN D (ERGOCALCIFEROL) 1.25 MG (50000 UNIT) PO CAPS: 50000.0000 [IU] | ORAL_CAPSULE | ORAL | 0 refills | Status: DC

## 2018-06-28 MED ORDER — METFORMIN HCL 500 MG PO TABS: 500.0000 mg | ORAL_TABLET | Freq: Every day | ORAL | 0 refills | Status: DC

## 2018-06-28 NOTE — Progress Notes (Signed)
Office: [phone]  /  Fax: [phone]   HPI:   Chief Complaint: OBESITY Michelle Huerta is here to discuss her progress with her obesity treatment plan. She is on the Category 3 plan and is following her eating plan approximately 30 % of the time. She states she is doing physical therapy 45 minutes 2 times per week. Michelle Huerta has done well maintaining weight and even losing a little weight this month. She has not been following her plan closely and is mostly doing portion control and smarter choices.  Her weight is 297 lb (134.7 kg) today and has had a weight loss of 1 pound over a period of 2 weeks since her last visit. She has lost 0 lbs since starting treatment with [place].  Vitamin D deficiency Michelle Huerta has a diagnosis of vitamin D deficiency. She is currently stable on vit D, but is not yet at goal. She denies nausea, vomiting, or muscle weakness.  Pre-Diabetes Michelle Huerta has a diagnosis of pre-diabetes based on her elevated Hgb A1c and was informed this puts her at greater risk of developing diabetes. She is tolerating metformin well and she notes decreasing polyphagia. She continues to work on diet and exercise to decrease risk of diabetes.  At risk for diabetes Michelle Huerta is at higher than average risk for developing diabetes due to her pre-diabetes and obesity. She currently denies polyuria or polydipsia.  Hypertension Michelle Huerta is a 57 y.o. female with hypertension. Michelle Huerta's blood pressure is elevated today and she hasn't taken medicine yet. She is working on weight loss to help control her blood pressure with the goal of decreasing her risk of heart attack and stroke.  Michelle Huerta denies chest pain or headache.  ASSESSMENT AND PLAN:  Vitamin D deficiency - Plan: Vitamin D, Ergocalciferol, (DRISDOL) 1.25 MG (50000 UT) CAPS capsule  Prediabetes - Plan: metFORMIN (GLUCOPHAGE) 500 MG tablet  Essential hypertension  At risk for diabetes mellitus  Class 3 severe obesity with serious comorbidity and body mass  index (BMI) of 45.0 to 49.9 in adult, unspecified obesity type (Michelle Huerta)  PLAN:  Vitamin D Deficiency Michelle Huerta was informed that low vitamin D levels contributes to fatigue and are associated with obesity, breast, and colon cancer. She agrees to continue to take prescription Vit D @50 ,000 IU every week #4 with no refills and will follow up for routine testing of vitamin D, at least 2-3 times per year. She was informed of the risk of over-replacement of vitamin D and agrees to not increase her dose unless she discusses this with [place] first. Michelle Huerta agreed to follow up with [place] in 3 weeks.  Diabetes risk counseling Michelle Huerta was given extended (15 minutes) diabetes prevention counseling today. She is 57 y.o. female and has risk factors for diabetes including pre-diabetes and obesity. We discussed intensive lifestyle modifications today with an emphasis on weight loss as well as increasing exercise and decreasing simple carbohydrates in her diet.  Hypertension We discussed sodium restriction, working on healthy weight loss, and a regular exercise program as the means to achieve improved blood pressure control. We will continue to monitor her blood pressure as well as her progress with the above lifestyle modifications. She will continue her weight loss and medications as prescribed and will watch for signs of hypotension as she continues her lifestyle modifications. We will recheck her blood pressure in 3 weeks. Michelle Huerta agreed with this plan and agreed to follow up as directed.  Obesity Michelle Huerta is currently in the action stage of change. As  such, her goal is to maintain weight over the holidays. She has agreed to portion control better and make smarter food choices, such as increase vegetables and decrease simple carbohydrates.  Michelle Huerta has been instructed to work up to a goal of 150 minutes of combined cardio and strengthening exercise per week for weight loss and overall health benefits. We discussed the following Behavioral  Modification Strategies today: increasing lean protein intake, decreasing simple carbohydrates, holiday eating strategies, and celebration eating strategies.  Michelle Huerta has agreed to follow up with our clinic in 3 weeks. She was informed of the importance of frequent follow up visits to maximize her success with intensive lifestyle modifications for her multiple health conditions.  ALLERGIES: Allergies  Allergen Reactions  . Penicillins Rash and Other (See Comments)    Has patient had a PCN reaction causing immediate rash, facial/tongue/throat swelling, SOB or lightheadedness with hypotension: Yes Has patient had a PCN reaction causing severe rash involving mucus membranes or skin necrosis: No Has patient had a PCN reaction that required hospitalization: No Has patient had a PCN reaction occurring within the last 10 years: No If all of the above answers are "NO", then may proceed with Cephalosporin use.   [name] Reclast [Zoledronic Acid] Nausea And Vomiting    MEDICATIONS: Current Outpatient Medications on File Prior to Visit  Medication Sig Dispense Refill  . amLODipine (NORVASC) 5 MG tablet Take 1 tablet (5 mg total) by mouth daily. 90 tablet 3  . cetirizine (ZYRTEC) 10 MG tablet Take 10 mg by mouth daily.    [name] estradiol (ESTRACE) 0.1 MG/GM vaginal cream Apply 1g 1 to 3 times per week 42.5 g 12  . HYDROcodone-acetaminophen (NORCO/VICODIN) 5-325 MG tablet Take 1-2 tablets by mouth every 6 (six) hours as needed for moderate pain. 30 tablet 0  . hydrOXYzine (ATARAX/VISTARIL) 50 MG tablet Take 1-2 tablets (50-100 mg total) by mouth every 8 (eight) hours as needed for itching. 90 tablet 1  . ibuprofen (ADVIL,MOTRIN) 800 MG tablet Take 1 tablet (800 mg total) by mouth every 8 (eight) hours as needed. 90 tablet 0  . oxyCODONE (ROXICODONE) 5 MG immediate release tablet Take 1-2 tablets (5-10 mg total) by mouth every 4 (four) hours as needed for severe pain. 40 tablet 0  . telmisartan (MICARDIS) 80 MG  tablet Take 1 tablet (80 mg total) by mouth daily. 90 tablet 3  . tiZANidine (ZANAFLEX) 4 MG tablet Take 1 tablet (4 mg total) by mouth every 6 (six) hours as needed for muscle spasms. 40 tablet 0  . traZODone (DESYREL) 100 MG tablet Take 1-3 tablets (100-300 mg total) by mouth at bedtime as needed for sleep. 90 tablet 1  . valACYclovir (VALTREX) 500 MG tablet Take 1 tablet (500 mg total) by mouth daily. 90 tablet 3  . zolpidem (AMBIEN) 5 MG tablet Take 1 tablet (5 mg total) by mouth at bedtime as needed for sleep. 15 tablet 1   No current facility-administered medications on file prior to visit.     PAST MEDICAL HISTORY: Past Medical History:  Diagnosis Date  . Allergy   . Arthritis of both knees   . Bilateral chronic knee pain   . Chronic back pain   . Constipation   . DDD (degenerative disc disease), cervical   . Depression    no meds  . Genital herpes    HSV type 2 at rectum, culture positive 3/15  . Glaucoma   . Grade I diastolic dysfunction 37/04/6268   noted on  ECHO   . Heart murmur   . History of colon polyps   . HLD (hyperlipidemia)   . Hypertension   . Hypothyroidism   . Insomnia   . Internal hemorrhoids   . Lactose intolerance   . Lupus ([place])   . LVH (left ventricular hypertrophy) 06/25/2015   Mild, noted on ECHO   . Mild sleep apnea    1/17 CPAP Dr Claiborne Billings does not use cpap   . Morbid obesity ([place]) 06/11/2015  . Osteoporosis   . Paget's disease   . Paget's disease of bone    Dr Trudie Reed, Reclast insufion 02/14/10 - bad reaction to infusion, now tolerates fosamax weekly  . Plantar fasciitis, bilateral   . PMB (postmenopausal bleeding)   . Pre-diabetes   . Sleep apnea   . Thrombocytopenia ([place])   . TR (tricuspid regurgitation) 06/25/2015   Trace, noted on ECHO   . Tubular adenoma polyp of rectum    8/12, Dr Fuller Plan, 5 yr follow up   . Uterine fibroid 10/19/2017   noted on CT pelvis  . Vitamin D deficiency     PAST SURGICAL HISTORY: Past Surgical History:    Procedure Laterality Date  . AUGMENTATION MAMMAPLASTY Bilateral   . BREAST ENHANCEMENT SURGERY    . BREAST REDUCTION SURGERY    . COLONOSCOPY  01/19/2016  . DILATION AND CURETTAGE OF UTERUS N/A 06/24/2015   Procedure: DILATATION AND CURETTAGE;  Surgeon: Emily Filbert, MD;  Location: [facility];  Service: Gynecology;  Laterality: N/A;  . KNEE ARTHROSCOPY WITH SUBCHONDROPLASTY Right 05/03/2018   Procedure: RIGHT KNEE ARTHROSCOPY WITH DEBRIDEMENT, PARTIAL MEDIAL MENISCECTOMY SUBCHONDROPLASTY MEDIAL TIBIAL PLATEAU AND MEDIAL FEMORAL CONDYLE;  Surgeon: Mcarthur Rossetti, MD;  Location: [facility];  Service: Orthopedics;  Laterality: Right;  . REDUCTION MAMMAPLASTY Bilateral   . TOE SURGERY     removal of bone in each foot  . TUBAL LIGATION      SOCIAL HISTORY: Social History   Tobacco Use  . Smoking status: Former Smoker    Years: 7.00    Types: Cigars    Last attempt to quit: 08/11/2008    Years since quitting: 9.8  . Smokeless tobacco: Never Used  . Tobacco comment: smoked [employer], 1 pack per day (5 in a pack)  Substance Use Topics  . Alcohol use: Yes    Alcohol/week: 5.0 standard drinks    Types: 5 Cans of beer per week    Comment: occ   . Drug use: Never    FAMILY HISTORY: Family History  Problem Relation Age of Onset  . Hypertension Mother   . Heart disease Mother   . Hypertension Father   . Hypertension Sister   . Cancer Maternal Grandmother   . Stomach cancer Paternal Grandmother   . Hypertension Son   . Hypertension Son   . Colon cancer Neg Hx    ROS: Review of Systems  Constitutional: Positive for weight loss.  Cardiovascular: Negative for chest pain.  Gastrointestinal: Negative for nausea and vomiting.  Genitourinary:       Negative for polyuria.  Musculoskeletal:       Negative for muscle weakness.  Neurological: Negative for headaches.  Endo/Heme/Allergies: Negative for polydipsia.       Positive for polyphagia.   PHYSICAL EXAM: Blood pressure (!)  148/77, pulse 68, temperature 97.9 F (36.6 C), temperature source Oral, height 5\' 7"  (1.702 m), weight 297 lb (134.7 kg), last menstrual period 08/01/2012, SpO2 99 %. Body mass index is 46.52  kg/m. Physical Exam Vitals signs reviewed.  Constitutional:      Appearance: Normal appearance. She is obese.  Cardiovascular:     Rate and Rhythm: Normal rate.  Pulmonary:     Effort: Pulmonary effort is normal.  Musculoskeletal: Normal range of motion.  Skin:    General: Skin is warm and dry.  Neurological:     Mental Status: She is alert and oriented to person, place, and time.  Psychiatric:        Mood and Affect: Mood normal.        Behavior: Behavior normal.    RECENT LABS AND TESTS: BMET    Component Value Date/Time   NA 139 05/30/2018 1116   K 4.2 05/30/2018 1116   CL 101 05/30/2018 1116   CO2 23 05/30/2018 1116   GLUCOSE 86 05/30/2018 1116   GLUCOSE 104 (H) 04/26/2018 0837   BUN 13 05/30/2018 1116   CREATININE 0.73 05/30/2018 1116   CALCIUM 9.2 05/30/2018 1116   GFRNONAA 93 05/30/2018 1116   GFRAA 107 05/30/2018 1116   Lab Results  Component Value Date   HGBA1C 6.3 (H) 05/30/2018   HGBA1C 6.0 (A) 03/09/2018   Lab Results  Component Value Date   INSULIN 41.4 (H) 05/30/2018   CBC    Component Value Date/Time   WBC 6.4 05/30/2018 1116   WBC 6.0 04/26/2018 0837   RBC 4.57 05/30/2018 1116   RBC 4.71 04/26/2018 0837   HGB 11.6 05/30/2018 1116   HCT 37.0 05/30/2018 1116   PLT 173 05/30/2018 1116   MCV 81 05/30/2018 1116   MCH 25.4 (L) 05/30/2018 1116   MCH 25.9 (L) 04/26/2018 0837   MCHC 31.4 (L) 05/30/2018 1116   MCHC 29.8 (L) 04/26/2018 0837   RDW 13.7 05/30/2018 1116   LYMPHSABS 2.4 05/30/2018 1116   MONOABS 0.4 06/19/2012 0936   EOSABS 0.1 05/30/2018 1116   BASOSABS 0.0 05/30/2018 1116   Iron/TIBC/Ferritin/ %Sat No results found for: IRON, TIBC, FERRITIN, IRONPCTSAT Lipid Panel     Component Value Date/Time   CHOL 134 05/30/2018 1116   TRIG 85  05/30/2018 1116   HDL 75 05/30/2018 1116   CHOLHDL 1.8 05/30/2018 1116   LDLCALC 42 05/30/2018 1116   Hepatic Function Panel     Component Value Date/Time   PROT 7.8 05/30/2018 1116   ALBUMIN 3.9 05/30/2018 1116   AST 29 05/30/2018 1116   ALT 40 (H) 05/30/2018 1116   ALKPHOS 91 05/30/2018 1116   BILITOT 0.5 05/30/2018 1116      Component Value Date/Time   TSH 1.280 05/30/2018 1116   TSH 1.220 04/26/2018 1647   TSH 1.738 04/06/2015 1520   Results for Krygier, Krissi L (MRN [id number]) as of 06/28/2018 14:37  Ref. Range 05/30/2018 11:16  Vitamin D, 25-Hydroxy Latest Ref Range: 30.0 - 100.0 ng/mL 19.7 (L)     OBESITY BEHAVIORAL INTERVENTION VISIT  Today's visit was # 4   Starting weight: 296 lbs Starting date: 05/30/18 Today's weight : Weight: 297 lb (134.7 kg)  Today's date: 06/28/2018 Total lbs lost to date: 0  ASK: We discussed the diagnosis of obesity with [name] today and Kitiara agreed to give [place] permission to discuss obesity behavioral modification therapy today.  ASSESS: Jamyrah has the diagnosis of obesity and her BMI today is 46.5. Triston is in the action stage of change.   ADVISE: Amily was educated on the multiple health risks of obesity as well as the benefit of weight loss to  improve her health. She was advised of the need for long term treatment and the importance of lifestyle modifications to improve her current health and to decrease her risk of future health problems.  AGREE: Multiple dietary modification options and treatment options were discussed and Adream agreed to follow the recommendations documented in the above note.  ARRANGE: Kanetra was educated on the importance of frequent visits to treat obesity as outlined per CMS and USPSTF guidelines and agreed to schedule her next follow up appointment today.  I, Marcille Blanco, am acting as transcriptionist for Starlyn Skeans, MD I have reviewed the above documentation for accuracy and completeness, and I  agree with the above. -Dennard Nip, MD

## 2018-06-29 ENCOUNTER — Other Ambulatory Visit: Payer: Self-pay | Admitting: Physician Assistant

## 2018-06-29 DIAGNOSIS — Z1231 Encounter for screening mammogram for malignant neoplasm of breast: Secondary | ICD-10-CM

## 2018-06-29 MED FILL — ZOLPIDEM TARTRATE 5 MG TAB: 5 | 15 days supply | Qty: 15 | Fill #0

## 2018-07-02 ENCOUNTER — Encounter: Payer: Self-pay | Admitting: Physical Therapy

## 2018-07-02 ENCOUNTER — Ambulatory Visit: Payer: 59 | Admitting: Physical Therapy

## 2018-07-02 DIAGNOSIS — M25661 Stiffness of right knee, not elsewhere classified: Secondary | ICD-10-CM | POA: Diagnosis not present

## 2018-07-02 DIAGNOSIS — M6281 Muscle weakness (generalized): Secondary | ICD-10-CM

## 2018-07-02 DIAGNOSIS — M25561 Pain in right knee: Secondary | ICD-10-CM | POA: Diagnosis not present

## 2018-07-02 DIAGNOSIS — R262 Difficulty in walking, not elsewhere classified: Secondary | ICD-10-CM | POA: Diagnosis not present

## 2018-07-02 NOTE — Therapy (Addendum)
[place], [place], [address] Phone: [phone]   Fax:  [phone]  Physical Therapy Treatment/Discharge   Patient Details  Name: Michelle Huerta MRN: [id number] Date of Birth: Mar 24, 1961 Referring Provider (PT): Santiago Bumpers    Encounter Date: 07/02/2018  PT End of Session - 07/02/18 0917    Visit Number  8    Number of Visits  12    Date for PT Re-Evaluation  07/30/18    Authorization Type  [facility]    PT Start Time  7371    PT Stop Time  0928    PT Time Calculation (min)  40 min    Activity Tolerance  Patient tolerated treatment well    Behavior During Therapy  [facility] for tasks assessed/performed       Past Medical History:  Diagnosis Date  . Allergy   . Arthritis of both knees   . Bilateral chronic knee pain   . Chronic back pain   . Constipation   . DDD (degenerative disc disease), cervical   . Depression    no meds  . Genital herpes    HSV type 2 at rectum, culture positive 3/15  . Glaucoma   . Grade I diastolic dysfunction 01/04/9484   noted on ECHO   . Heart murmur   . History of colon polyps   . HLD (hyperlipidemia)   . Hypertension   . Hypothyroidism   . Insomnia   . Internal hemorrhoids   . Lactose intolerance   . Lupus ([place])   . LVH (left ventricular hypertrophy) 06/25/2015   Mild, noted on ECHO   . Mild sleep apnea    1/17 CPAP Dr Claiborne Billings does not use cpap   . Morbid obesity ([place]) 06/11/2015  . Osteoporosis   . Paget's disease   . Paget's disease of bone    Dr Trudie Reed, Reclast insufion 02/14/10 - bad reaction to infusion, now tolerates fosamax weekly  . Plantar fasciitis, bilateral   . PMB (postmenopausal bleeding)   . Pre-diabetes   . Sleep apnea   . Thrombocytopenia ([place])   . TR (tricuspid regurgitation) 06/25/2015   Trace, noted on ECHO   . Tubular adenoma polyp of rectum    8/12, Dr Fuller Plan, 5 yr follow up   . Uterine fibroid 10/19/2017   noted on CT  pelvis  . Vitamin D deficiency     Past Surgical History:  Procedure Laterality Date  . AUGMENTATION MAMMAPLASTY Bilateral   . BREAST ENHANCEMENT SURGERY    . BREAST REDUCTION SURGERY    . COLONOSCOPY  01/19/2016  . DILATION AND CURETTAGE OF UTERUS N/A 06/24/2015   Procedure: DILATATION AND CURETTAGE;  Surgeon: Emily Filbert, MD;  Location: [facility];  Service: Gynecology;  Laterality: N/A;  . KNEE ARTHROSCOPY WITH SUBCHONDROPLASTY Right 05/03/2018   Procedure: RIGHT KNEE ARTHROSCOPY WITH DEBRIDEMENT, PARTIAL MEDIAL MENISCECTOMY SUBCHONDROPLASTY MEDIAL TIBIAL PLATEAU AND MEDIAL FEMORAL CONDYLE;  Surgeon: Mcarthur Rossetti, MD;  Location: [facility];  Service: Orthopedics;  Laterality: Right;  . REDUCTION MAMMAPLASTY Bilateral   . TOE SURGERY     removal of bone in each foot  . TUBAL LIGATION      There were no vitals filed for this visit.  Subjective Assessment - 07/02/18 0910    Subjective  Patient reports her knee is stiff and sore. She reports it is about the same as it is .     Pertinent History  Paget's Disease, Discoid  Lupus     Limitations  Sitting;Standing;Walking    How long can you sit comfortably?  30 minutes     How long can you walk comfortably?  not walking too far right now before needs rest     Diagnostic tests  Xray showing Medial right knee space narrowing     Patient Stated Goals  Improve pain to get back to work     Currently in Pain?  Yes    Pain Score  1     Pain Location  Knee    Pain Orientation  Right    Pain Descriptors / Indicators  Aching;Sore    Pain Type  Surgical pain    Pain Onset  1 to 4 weeks ago    Pain Frequency  Intermittent    Aggravating Factors   standing and sitting     Pain Relieving Factors  rest     Effect of Pain on Daily Activities  decreased mobility     Multiple Pain Sites  No         [facility] PT Assessment - 07/02/18 0001      AROM   Right Knee Extension  0    Right Knee Flexion  115                   [facility]  Adult PT Treatment/Exercise - 07/02/18 0001      Lumbar Exercises: Stretches   Active Hamstring Stretch  3 reps;20 seconds;Right    Other Lumbar Stretch Exercise  reviewed Thomas stretch 3x20 sec hold       Knee/Hip Exercises: Hydrologist Limitations  seated 2x30 each    Gastroc Stretch Limitations  2*30 slant board      Knee/Hip Exercises: Aerobic   Nustep  5 min L3 LE only      Knee/Hip Exercises: Standing   Heel Raises Limitations  2x10     Lateral Step Up Limitations  2 in step with UE support    Forward Step Up Limitations  2 in step with UE support      Knee/Hip Exercises: Seated   Long Arc Quad  Both;15 reps      Knee/Hip Exercises: Supine   Quad Sets  10 reps;Limitations    Short Arc Quad Sets  2 sets;15 reps    Heel Slides  AAROM;2 sets;10 reps    Bridges Limitations  15    Other Supine Knee/Hip Exercises  clam shell green x20              PT Education - 07/02/18 [phone]    Education Details  reviewed HEP and symptom mangement     Person(s) Educated  Patient    Methods  Explanation;Demonstration;Tactile cues;Verbal cues    Comprehension  Returned demonstration;Verbal cues required;Tactile cues required;Verbalized understanding       PT Short Term Goals - 07/02/18 1433      PT SHORT TERM GOAL #1   Title  Pt will improve R knee flexion by 10 degrees     Period  Weeks    Status  Achieved      PT SHORT TERM GOAL #2   Title  Pt will improve R knee flexor and extensor strength to 4+/5     Baseline  No significant improvement     Time  3    Period  Weeks    Status  On-going      PT SHORT TERM GOAL #3   Title  Pt will report <2/10 pain with sit<>Stands     Baseline  1-2/10 pain     Time  3    Period  Weeks    Status  Achieved        PT Long Term Goals - 07/02/18 1434      PT LONG TERM GOAL #1   Title  Pt will be able to sit or stand >1 hour without increased knee pain in order to perform ADLs     Baseline  no improvement      Time  6    Period  Weeks    Status  On-going      PT LONG TERM GOAL #2   Title  Pt will improve R knee strength to 5/5 in order to improve gait and mobility to return to work.     Baseline  not met     Time  6    Period  Weeks    Status  On-going      PT LONG TERM GOAL #3   Title  Pt will decreased right knee edema by 2cm in order to improve overall knee mobility     Baseline  no significant change     Time  6    Period  Weeks    Status  On-going      PT LONG TERM GOAL #4   Title  Pt will be able to ascend and descend stairs with 1 rail and good knee control, pain no more than minimal     Baseline  still pain and difficulty with stairs    Time  6    Period  Weeks    Status  On-going      PT LONG TERM GOAL #5   Title  Pt will increase hip strength to 4+/5 or better for improved knee stability with gait mechanics.     Baseline  4+/5    Time  6    Period  Weeks    Status  On-going            Plan - 07/02/18 0355    Clinical Impression Statement  Patient continues to report soreness and stiffness when she is up on her knee. Her FOTO socre got worse. She reports her knee has been giving out at times. She was encouraged to continue her exercises at home. Therapy continues to work on Cytogeneticist. We have just added in stair training. She will continue 1x a week for 4 more weeks until she sees the MD for follow up. She has almost her full HEP. The important thing at this time is that she does it.     Clinical Presentation  Stable    Clinical Decision Making  Low    Rehab Potential  Good    PT Frequency  2x / week    PT Duration  6 weeks    PT Treatment/Interventions  ADLs/Self Care Home Management;Cryotherapy;Ultrasound;Moist Heat;Iontophoresis 56m/ml Dexamethasone;Electrical Stimulation;DME Instruction;Gait training;Stair training;Balance training;Therapeutic exercise;Therapeutic activities;Functional mobility training;Neuromuscular re-education;Patient/family  education;Manual techniques;Passive range of motion;Taping;Vasopneumatic Device;Scar mobilization;Joint Manipulations    PT Next Visit Plan  progress step height if tolerated, asked her to bring shoes    PT Home Exercise Plan  seated hamstring stretch, knee flexion stretch, quad sets, SAQ, and SLR, bridge, mini wall squat    Consulted and Agree with Plan of Care  Patient       Patient will benefit from skilled therapeutic intervention in order to improve the following deficits  and impairments:  Decreased balance, Decreased endurance, Decreased mobility, Difficulty walking, Hypomobility, Pain, Postural dysfunction, Impaired flexibility, Increased fascial restricitons, Decreased strength, Obesity, Decreased range of motion, Abnormal gait  Visit Diagnosis: Acute pain of right knee  Stiffness of right knee, not elsewhere classified  Muscle weakness (generalized)  Difficulty in walking, not elsewhere classified     Problem List Patient Active Problem List   Diagnosis Date Noted  . Status post arthroscopic surgery of right knee 05/09/2018  . Chronic pain of right knee 05/09/2018  . Closed fracture of right tibial plateau   . Unilateral primary osteoarthritis, right knee 04/23/2018  . Acute medial meniscus tear of right knee 04/23/2018  . Closed fracture of medial plateau of left tibia 04/23/2018  . Severe obesity (BMI >= 40) ([place]) 04/23/2018  . Osteoarthritis of right knee 03/09/2018  . Prediabetes 03/09/2018  . Bilateral knee pain 12/28/2017  . Constipation 12/18/2017  . Cough 12/15/2017  . Lower resp. tract infection 12/15/2017  . Acute conjunctivitis of right eye 12/15/2017  . H/O Paget's disease of bone 09/10/2017  . OSA on CPAP 10/28/2015  . Fibroids 08/07/2015  . Plantar fasciitis, bilateral 08/03/2015  . Metatarsal deformity 08/03/2015  . Equinus deformity of foot, acquired 08/03/2015  . Pronation deformity of both feet 08/03/2015  . Morbid obesity ([place]) 06/11/2015  .  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 Essential (primary) hypertension 06/11/2015  . 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 Benign essential HTN 09/19/2013  . Major depressive episode 09/19/2013   PHYSICAL THERAPY DISCHARGE SUMMARY  Visits from Start of Care: 8  Current functional level related to goals / functional outcomes: Continued pain but has an HE   Remaining deficits: Continued pain with ambulation   Education / Equipment: HEP   Plan: Patient agrees to discharge.  Patient goals were not met. Patient is being discharged due to lack of progress.  ?????      Carney Living  PT DPT  07/02/2018, 2:36 PM  [facility] [address] [place], [place], [address] Phone: [phone]   Fax:  [phone]  Name: Michelle Huerta MRN: [id number] Date of Birth: Sep 02, 1960

## 2018-07-06 ENCOUNTER — Ambulatory Visit: Payer: 59 | Admitting: Physical Therapy

## 2018-07-12 ENCOUNTER — Ambulatory Visit (INDEPENDENT_AMBULATORY_CARE_PROVIDER_SITE_OTHER): Payer: Self-pay | Admitting: Psychology

## 2018-07-13 MED FILL — metFORMIN HCL 500 MG TABS: 500 | 30 days supply | Qty: 30 | Fill #0

## 2018-07-13 MED FILL — VIT D2 1.25 MG (50,000 UNIT: 1.25 MG | 28 days supply | Qty: 4 | Fill #0

## 2018-07-16 ENCOUNTER — Encounter: Payer: 59 | Admitting: Physical Therapy

## 2018-07-18 ENCOUNTER — Ambulatory Visit: Payer: 59 | Admitting: Family Medicine

## 2018-07-18 ENCOUNTER — Encounter: Payer: Self-pay | Admitting: Family Medicine

## 2018-07-18 ENCOUNTER — Ambulatory Visit: Payer: 59 | Admitting: Physical Therapy

## 2018-07-18 ENCOUNTER — Other Ambulatory Visit: Payer: Self-pay

## 2018-07-18 VITALS — BP 140/82 | HR 87 | Temp 99.0°F | Ht 67.0 in | Wt 295.4 lb

## 2018-07-18 DIAGNOSIS — G4700 Insomnia, unspecified: Secondary | ICD-10-CM | POA: Diagnosis not present

## 2018-07-18 DIAGNOSIS — N952 Postmenopausal atrophic vaginitis: Secondary | ICD-10-CM

## 2018-07-18 DIAGNOSIS — S91209D Unspecified open wound of unspecified toe(s) with damage to nail, subsequent encounter: Secondary | ICD-10-CM

## 2018-07-18 DIAGNOSIS — G4733 Obstructive sleep apnea (adult) (pediatric): Secondary | ICD-10-CM | POA: Diagnosis not present

## 2018-07-18 DIAGNOSIS — G4709 Other insomnia: Secondary | ICD-10-CM | POA: Diagnosis not present

## 2018-07-18 DIAGNOSIS — S91201D Unspecified open wound of right great toe with damage to nail, subsequent encounter: Secondary | ICD-10-CM

## 2018-07-18 MED ORDER — HYDROXYZINE HCL 50 MG PO TABS: 25.0000 mg | ORAL_TABLET | Freq: Every evening | ORAL | 1 refills | Status: DC | PRN

## 2018-07-18 MED ORDER — ESTRADIOL 10 MCG VA INST: 10.0000 ug | VAGINAL_INSERT | VAGINAL | 1 refills | Status: DC

## 2018-07-18 MED FILL — TEMAZEPAM 30 MG CAPSULE: 30 | 30 days supply | Qty: 30 | Fill #0

## 2018-07-18 MED FILL — ESTRADIOL 10 MCG TABS: 10 | 84 days supply | Qty: 12 | Fill #0

## 2018-07-18 NOTE — Patient Instructions (Addendum)
   No sign of infection in nail.  Ok to smooth any sharp edges with nail file. If any pain, redness of surrounding skin - return for recheck.   Keep follow-up with sleep specialist this morning as planned.  Okay to try hydroxyzine in place of trazodone at bedtime but discussed your sleep issues with sleep specialist today.  Blood pressure was borderline today, but expect that to improve with restarting sleep apnea treatment.   I refilled the estrogen for now, but please follow-up with primary care provider as planned.  Thank you for coming in today  Return to the clinic or go to the nearest emergency room if any of your symptoms worsen or new symptoms occur.    If you have lab work done today you will be contacted with your lab results within the next 2 weeks.  If you have not heard from [place] then please contact us. The fastest way to get your results is to register for My Chart.   IF you received an x-ray today, you will receive an invoice from [facility]. Please contact [facility] at [phone] with questions or concerns regarding your invoice.   IF you received labwork today, you will receive an invoice from [facility]. Please contact [facility] at [phone] with questions or concerns regarding your invoice.   Our billing staff will not be able to assist you with questions regarding bills from these companies.  You will be contacted with the lab results as soon as they are available. The fastest way to get your results is to activate your My Chart account. Instructions are located on the last page of this paperwork. If you have not heard from [place] regarding the results in 2 weeks, please contact this office.

## 2018-07-18 NOTE — Progress Notes (Signed)
Subjective:    Patient ID: Michelle Huerta, female    DOB: September 28, 1960, 58 y.o.   MRN: [id number]  HPI Michelle Huerta is a 58 y.o. female Presents today for: Chief Complaint  Patient presents with  . Wound Check    right toe nail was removed, 1 month ago   . Medication Refill    Estradiol and trazadone   Seen November 8 for avulsion of toenail of right foot.  It was almost completely avulsed at that time, but remaining attached nail was removed without difficulty. No pain.  New toenail growing in but somewhat bumpy. No d/c. Wearing regular shoe and socks ok.   Medication refills.  PCP is Forrest Moron, MD.  It appears her next appointment with Dr. Nolon Rod is not until March 2.  She been followed by [facility] previously.  Insomnia: 100-300mg  trazodone at bedtime.  Has tried up to 3 at bedtime - still difficulty sleeping. Has not used hydroxyzine - thought was for itching. . Trouble getting to sleep and staying asleep. Waking up frequently. Has OSA, but not using CPAP. Has appt today with sleep specialist.   Atrophic vaginitis. Prefers 71mcg inset over cream. Want temporary refill until appt with PCP.   Patient Active Problem List   Diagnosis Date Noted  . Status post arthroscopic surgery of right knee 05/09/2018  . Chronic pain of right knee 05/09/2018  . Closed fracture of right tibial plateau   . Unilateral primary osteoarthritis, right knee 04/23/2018  . Acute medial meniscus tear of right knee 04/23/2018  . Closed fracture of medial plateau of left tibia 04/23/2018  . Severe obesity (BMI >= 40) ([place]) 04/23/2018  . Osteoarthritis of right knee 03/09/2018  . Prediabetes 03/09/2018  . Bilateral knee pain 12/28/2017  . Constipation 12/18/2017  . Cough 12/15/2017  . Lower resp. tract infection 12/15/2017  . Acute conjunctivitis of right eye 12/15/2017  . H/O Paget's disease of bone 09/10/2017  . OSA on CPAP 10/28/2015  . Fibroids 08/07/2015  . Plantar fasciitis,  bilateral 08/03/2015  . Metatarsal deformity 08/03/2015  . Equinus deformity of foot, acquired 08/03/2015  . Pronation deformity of both feet 08/03/2015  . Morbid obesity ([place]) 06/11/2015  . 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 Essential (primary) hypertension 06/11/2015  . 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 Benign essential HTN 09/19/2013  . Major depressive episode 09/19/2013   Past Medical History:  Diagnosis Date  . Allergy   . Arthritis of both knees   . Bilateral chronic knee pain   . Chronic back pain   . Constipation   . DDD (degenerative disc disease), cervical   . Depression    no meds  . Genital herpes    HSV type 2 at rectum, culture positive 3/15  . Glaucoma   . Grade I diastolic dysfunction 96/28/3662   noted on ECHO   . Heart murmur   . History of colon polyps   . HLD (hyperlipidemia)   . Hypertension   . Hypothyroidism   . Insomnia   . Internal hemorrhoids   . Lactose intolerance   . Lupus ([place])   . LVH (left ventricular hypertrophy) 06/25/2015   Mild, noted on ECHO   . Mild sleep apnea    1/17 CPAP Dr Claiborne Billings does not use cpap   . Morbid obesity ([place]) 06/11/2015  . Osteoporosis   . Paget's disease   . Paget's disease of bone    Dr Trudie Reed, Reclast insufion 02/14/10 -  bad reaction to infusion, now tolerates fosamax weekly  . Plantar fasciitis, bilateral   . PMB (postmenopausal bleeding)   . Pre-diabetes   . Sleep apnea   . Thrombocytopenia ([place])   . TR (tricuspid regurgitation) 06/25/2015   Trace, noted on ECHO   . Tubular adenoma polyp of rectum    8/12, Dr Fuller Plan, 5 yr follow up   . Uterine fibroid 10/19/2017   noted on CT pelvis  . Vitamin D deficiency    Past Surgical History:  Procedure Laterality Date  . AUGMENTATION MAMMAPLASTY Bilateral   . BREAST ENHANCEMENT SURGERY    . BREAST REDUCTION SURGERY    . COLONOSCOPY  01/19/2016  . DILATION AND CURETTAGE OF UTERUS N/A 06/24/2015   Procedure: DILATATION AND CURETTAGE;  Surgeon: Emily Filbert, MD;  Location: [facility];  Service: Gynecology;  Laterality: N/A;  . KNEE ARTHROSCOPY WITH SUBCHONDROPLASTY Right 05/03/2018   Procedure: RIGHT KNEE ARTHROSCOPY WITH DEBRIDEMENT, PARTIAL MEDIAL MENISCECTOMY SUBCHONDROPLASTY MEDIAL TIBIAL PLATEAU AND MEDIAL FEMORAL CONDYLE;  Surgeon: Mcarthur Rossetti, MD;  Location: [facility];  Service: Orthopedics;  Laterality: Right;  . REDUCTION MAMMAPLASTY Bilateral   . TOE SURGERY     removal of bone in each foot  . TUBAL LIGATION     Allergies  Allergen Reactions  . Penicillins Rash and Other (See Comments)    Has patient had a PCN reaction causing immediate rash, facial/tongue/throat swelling, SOB or lightheadedness with hypotension: Yes Has patient had a PCN reaction causing severe rash involving mucus membranes or skin necrosis: No Has patient had a PCN reaction that required hospitalization: No Has patient had a PCN reaction occurring within the last 10 years: No If all of the above answers are "NO", then may proceed with Cephalosporin use.   [name] Reclast [Zoledronic Acid] Nausea And Vomiting   Prior to Admission medications   Medication Sig Start Date End Date Taking? Authorizing Provider  amLODipine (NORVASC) 5 MG tablet Take 1 tablet (5 mg total) by mouth daily. 04/26/18  Yes McVey, Gelene Mink, PA-C  cetirizine (ZYRTEC) 10 MG tablet Take 10 mg by mouth daily.   Yes [provider]  estradiol (ESTRACE) 0.1 MG/GM vaginal cream Apply 1g 1 to 3 times per week 04/26/18  Yes McVey, Gelene Mink, PA-C  ibuprofen (ADVIL,MOTRIN) 800 MG tablet Take 1 tablet (800 mg total) by mouth every 8 (eight) hours as needed. 05/04/18  Yes Mcarthur Rossetti, MD  metFORMIN (GLUCOPHAGE) 500 MG tablet Take 1 tablet (500 mg total) by mouth daily with breakfast. 06/28/18  Yes Leafy Ro, Caren D, MD  telmisartan (MICARDIS) 80 MG tablet Take 1 tablet (80 mg total) by mouth daily. 04/26/18  Yes McVey, Gelene Mink, PA-C  traZODone  (DESYREL) 100 MG tablet Take 1-3 tablets (100-300 mg total) by mouth at bedtime as needed for sleep. 06/04/18  Yes McVey, Gelene Mink, PA-C  valACYclovir (VALTREX) 500 MG tablet Take 1 tablet (500 mg total) by mouth daily. 04/26/18  Yes McVey, Gelene Mink, PA-C  Vitamin D, Ergocalciferol, (DRISDOL) 1.25 MG (50000 UT) CAPS capsule Take 1 capsule (50,000 Units total) by mouth every 7 (seven) days. 06/28/18  Yes Starlyn Skeans, MD   Social History   Socioeconomic History  . Marital status: Single    Spouse name: Not on file  . Number of children: Not on file  . Years of education: Not on file  . Highest education level: Not on file  Occupational History  . Occupation: Actuary- [employer]  Social  Needs  . Financial resource strain: Not on file  . Food insecurity:    Worry: Not on file    Inability: Not on file  . Transportation needs:    Medical: Not on file    Non-medical: Not on file  Tobacco Use  . Smoking status: Former Smoker    Years: 7.00    Types: Cigars    Last attempt to quit: 08/11/2008    Years since quitting: 9.9  . Smokeless tobacco: Never Used  . Tobacco comment: smoked [employer], 1 pack per day (5 in a pack)  Substance and Sexual Activity  . Alcohol use: Yes    Alcohol/week: 5.0 standard drinks    Types: 5 Cans of beer per week    Comment: occ   . Drug use: Never  . Sexual activity: Not Currently    Partners: Male    Birth control/protection: Surgical  Lifestyle  . Physical activity:    Days per week: Not on file    Minutes per session: Not on file  . Stress: Not on file  Relationships  . Social connections:    Talks on phone: Not on file    Gets together: Not on file    Attends religious service: Not on file    Active member of club or organization: Not on file    Attends meetings of clubs or organizations: Not on file    Relationship status: Not on file  . Intimate partner violence:    Fear of current or ex partner: Not on file     Emotionally abused: Not on file    Physically abused: Not on file    Forced sexual activity: Not on file  Other Topics Concern  . Not on file  Social History Narrative   Epworth sleepiness score as of 06/11/15 a 4   Exercise: yes, active at work, no formal exercise   Single, never married, in relationship off and on   Children: [place], [place] (both in [place]), 2 grandkids.  Raised 2 nieces [name], [address] (they have 5 kids)   Occupation: [employer]   Religion: faith important, not active in a church   Seatbelt use: yes    Review of Systems  Musculoskeletal: Negative for gait problem and joint swelling.  Skin: Negative for rash and wound.       Objective:   Physical Exam Constitutional:      General: She is not in acute distress.    Appearance: She is well-developed.  HENT:     Head: Normocephalic and atraumatic.  Cardiovascular:     Rate and Rhythm: Normal rate.  Pulmonary:     Effort: Pulmonary effort is normal.  Musculoskeletal:       Feet:  Neurological:     Mental Status: She is alert and oriented to person, place, and time.    Vitals:   07/18/18 0827 07/18/18 0835  BP: (!) 165/87 140/82  Pulse: 87   Temp: 99 F (37.2 C)   TempSrc: Oral   SpO2: 99%   Weight: 295 lb 6.4 oz (134 kg)   Height: 5\' 7"  (1.702 m)       Assessment & Plan:   Michelle Huerta is a 58 y.o. female Avulsion of toenail, subsequent encounter  -New toenail appears to be growing in with slight irregularity.  May have been due to initial damage to nail bed.  No sign of ingrown toenail or infection at this time but RTC precautions given  Insomnia, unspecified type - Plan: hydrOXYzine (ATARAX/VISTARIL) 50 MG tablet  -Possibly multifactorial.  Insufficient relief with trazodone, can try hydroxyzine, new prescription given.  Has follow-up with sleep specialist today, advised to discuss concerns with sleep at that visit.  Additionally restarting CPAP should also be  helpful for sleep as well as borderline blood pressure  Vaginal atrophy Postmenopausal atrophic vaginitis - Plan: Estradiol 10 MCG INST  -Estradiol refilled temporarily.  2 times per week.  If not covered by insurance would return to vaginal cream.  Follow-up with primary care provider as planned  Meds ordered this encounter  Medications  . hydrOXYzine (ATARAX/VISTARIL) 50 MG tablet    Sig: Take 0.5-1 tablets (25-50 mg total) by mouth at bedtime as needed (insomnia.).    Dispense:  60 tablet    Refill:  1  . Estradiol 10 MCG INST    Sig: Place 10 mcg vaginally 2 (two) times a week.    Dispense:  12 each    Refill:  1   Patient Instructions     No sign of infection in nail.  Ok to smooth any sharp edges with nail file. If any pain, redness of surrounding skin - return for recheck.   Keep follow-up with sleep specialist this morning as planned.  Okay to try hydroxyzine in place of trazodone at bedtime but discussed your sleep issues with sleep specialist today.  Blood pressure was borderline today, but expect that to improve with restarting sleep apnea treatment.   I refilled the estrogen for now, but please follow-up with primary care provider as planned.  Thank you for coming in today  Return to the clinic or go to the nearest emergency room if any of your symptoms worsen or new symptoms occur.    If you have lab work done today you will be contacted with your lab results within the next 2 weeks.  If you have not heard from [place] then please contact us. The fastest way to get your results is to register for My Chart.   IF you received an x-ray today, you will receive an invoice from [facility]. Please contact [facility] at [phone] with questions or concerns regarding your invoice.   IF you received labwork today, you will receive an invoice from [facility]. Please contact [facility] at [phone] with questions or concerns regarding your invoice.   Our  billing staff will not be able to assist you with questions regarding bills from these companies.  You will be contacted with the lab results as soon as they are available. The fastest way to get your results is to activate your My Chart account. Instructions are located on the last page of this paperwork. If you have not heard from [place] regarding the results in 2 weeks, please contact this office.       Signed,   Merri Ray, MD [facility].  07/18/18 9:05 AM

## 2018-07-19 ENCOUNTER — Encounter (INDEPENDENT_AMBULATORY_CARE_PROVIDER_SITE_OTHER): Payer: Self-pay | Admitting: Family Medicine

## 2018-07-19 ENCOUNTER — Ambulatory Visit (INDEPENDENT_AMBULATORY_CARE_PROVIDER_SITE_OTHER): Payer: 59 | Admitting: Family Medicine

## 2018-07-19 VITALS — BP 128/82 | HR 78 | Temp 98.1°F | Ht 67.0 in | Wt 293.0 lb

## 2018-07-19 DIAGNOSIS — R7303 Prediabetes: Secondary | ICD-10-CM | POA: Diagnosis not present

## 2018-07-19 DIAGNOSIS — H25013 Cortical age-related cataract, bilateral: Secondary | ICD-10-CM | POA: Diagnosis not present

## 2018-07-19 DIAGNOSIS — Z9189 Other specified personal risk factors, not elsewhere classified: Secondary | ICD-10-CM

## 2018-07-19 DIAGNOSIS — Z6841 Body Mass Index (BMI) 40.0 and over, adult: Secondary | ICD-10-CM

## 2018-07-19 DIAGNOSIS — H40013 Open angle with borderline findings, low risk, bilateral: Secondary | ICD-10-CM | POA: Diagnosis not present

## 2018-07-19 DIAGNOSIS — H2513 Age-related nuclear cataract, bilateral: Secondary | ICD-10-CM | POA: Diagnosis not present

## 2018-07-19 DIAGNOSIS — H35363 Drusen (degenerative) of macula, bilateral: Secondary | ICD-10-CM | POA: Diagnosis not present

## 2018-07-19 DIAGNOSIS — E559 Vitamin D deficiency, unspecified: Secondary | ICD-10-CM

## 2018-07-19 MED ORDER — VITAMIN D (ERGOCALCIFEROL) 1.25 MG (50000 UNIT) PO CAPS: 50000.0000 [IU] | ORAL_CAPSULE | ORAL | 0 refills | Status: DC

## 2018-07-23 NOTE — Progress Notes (Signed)
Office: [phone]  /  Fax: [phone]   HPI:   Chief Complaint: OBESITY Michelle Huerta is here to discuss her progress with her obesity treatment plan. She is on the portion control better and make smarter food choices plan and is following her eating plan approximately 30 % of the time. She states she is exercising 0 minutes 0 times per week. Michelle Huerta has been off track. She struggled with eating all of the food on the Category 3 plan. She is skipping breakfast or lunch. Her weight is 293 lb (132.9 kg) today and has had a weight loss of 4 pounds over a period of 3 weeks since her last visit. She has lost 3 lbs since starting treatment with [place].  Vitamin D deficiency Michelle Huerta has a diagnosis of vitamin D deficiency. Michelle Huerta is currently taking vit D and she is not at goal. Her last vitamin D level was at 19.7 on 05/30/2018. Michelle Huerta denies nausea, vomiting or muscle weakness.  Pre-Diabetes Siriah has a diagnosis of prediabetes based on her elevated Hgb A1c and was informed this puts her at greater risk of developing diabetes. Her last A1c was at 6.3.  She is taking metformin currently and continues to work on diet and exercise to decrease risk of diabetes. She denies polyphagia or hypoglycemia.  At risk for diabetes Michelle Huerta is at higher than average risk for developing diabetes due to her obesity and prediabetes. She currently denies polyuria or polydipsia.  ASSESSMENT AND PLAN:  Vitamin D deficiency - Plan: Vitamin D, Ergocalciferol, (DRISDOL) 1.25 MG (50000 UT) CAPS capsule  Prediabetes  At risk for diabetes mellitus  Class 3 severe obesity with serious comorbidity and body mass index (BMI) of 45.0 to 49.9 in adult, unspecified obesity type ([place])  PLAN:  Vitamin D Deficiency Michelle Huerta was informed that low vitamin D levels contributes to fatigue and are associated with obesity, breast, and colon cancer. She agrees to continue to take prescription Vit D @50 ,000 IU every week #4 with no refills and will follow  up for routine testing of vitamin D, at least 2-3 times per year. She was informed of the risk of over-replacement of vitamin D and agrees to not increase her dose unless she discusses this with [place] first. Maycie agrees to follow up as directed.  Pre-Diabetes Michelle Huerta will continue to work on weight loss, exercise, and decreasing simple carbohydrates in her diet to help decrease the risk of diabetes. We dicussed metformin including benefits and risks. She was informed that eating too many simple carbohydrates or too many calories at one sitting increases the likelihood of GI side effects. Aspyn will continue metformin and her meal plan and follow up with [place] as directed to monitor her progress.  Diabetes risk counseling Michelle Huerta was given extended (15 minutes) diabetes prevention counseling today. She is 58 y.o. female and has risk factors for diabetes including obesity and prediabetes. We discussed intensive lifestyle modifications today with an emphasis on weight loss as well as increasing exercise and decreasing simple carbohydrates in her diet.  Obesity Michelle Huerta is currently in the action stage of change. As such, her goal is to continue with weight loss efforts She has agreed to portion control better and make smarter food choices, such as increase vegetables and decrease simple carbohydrates or follow the Category 3 plan. Michelle Huerta has not been prescribed exercise at this time. We discussed the following Behavioral Modification Strategies today: no skipping meals, planning for success and work on meal planning and easy cooking plans Michelle Huerta  has agreed to follow up with our clinic in 2 weeks. She was informed of the importance of frequent follow up visits to maximize her success with intensive lifestyle modifications for her multiple health conditions.  ALLERGIES: Allergies  Allergen Reactions  . Penicillins Rash and Other (See Comments)    Has patient had a PCN reaction causing immediate rash, facial/tongue/throat  swelling, SOB or lightheadedness with hypotension: Yes Has patient had a PCN reaction causing severe rash involving mucus membranes or skin necrosis: No Has patient had a PCN reaction that required hospitalization: No Has patient had a PCN reaction occurring within the last 10 years: No If all of the above answers are "NO", then may proceed with Cephalosporin use.   [name] Reclast [Zoledronic Acid] Nausea And Vomiting    MEDICATIONS: Current Outpatient Medications on File Prior to Visit  Medication Sig Dispense Refill  . amLODipine (NORVASC) 5 MG tablet Take 1 tablet (5 mg total) by mouth daily. 90 tablet 3  . cetirizine (ZYRTEC) 10 MG tablet Take 10 mg by mouth daily.    . Estradiol 10 MCG INST Place 10 mcg vaginally 2 (two) times a week. 12 each 1  . hydrOXYzine (ATARAX/VISTARIL) 50 MG tablet Take 0.5-1 tablets (25-50 mg total) by mouth at bedtime as needed (insomnia.). 60 tablet 1  . ibuprofen (ADVIL,MOTRIN) 800 MG tablet Take 1 tablet (800 mg total) by mouth every 8 (eight) hours as needed. 90 tablet 0  . metFORMIN (GLUCOPHAGE) 500 MG tablet Take 1 tablet (500 mg total) by mouth daily with breakfast. 30 tablet 0  . telmisartan (MICARDIS) 80 MG tablet Take 1 tablet (80 mg total) by mouth daily. 90 tablet 3  . traZODone (DESYREL) 100 MG tablet Take 1-3 tablets (100-300 mg total) by mouth at bedtime as needed for sleep. 90 tablet 1  . valACYclovir (VALTREX) 500 MG tablet Take 1 tablet (500 mg total) by mouth daily. 90 tablet 3   No current facility-administered medications on file prior to visit.     PAST MEDICAL HISTORY: Past Medical History:  Diagnosis Date  . Allergy   . Arthritis of both knees   . Bilateral chronic knee pain   . Chronic back pain   . Constipation   . DDD (degenerative disc disease), cervical   . Depression    no meds  . Genital herpes    HSV type 2 at rectum, culture positive 3/15  . Glaucoma   . Grade I diastolic dysfunction 83/41/9622   noted on ECHO   .  Heart murmur   . History of colon polyps   . HLD (hyperlipidemia)   . Hypertension   . Hypothyroidism   . Insomnia   . Internal hemorrhoids   . Lactose intolerance   . Lupus ([place])   . LVH (left ventricular hypertrophy) 06/25/2015   Mild, noted on ECHO   . Mild sleep apnea    1/17 CPAP Dr Claiborne Billings does not use cpap   . Morbid obesity ([place]) 06/11/2015  . Osteoporosis   . Paget's disease   . Paget's disease of bone    Dr Trudie Reed, Reclast insufion 02/14/10 - bad reaction to infusion, now tolerates fosamax weekly  . Plantar fasciitis, bilateral   . PMB (postmenopausal bleeding)   . Pre-diabetes   . Sleep apnea   . Thrombocytopenia ([place])   . TR (tricuspid regurgitation) 06/25/2015   Trace, noted on ECHO   . Tubular adenoma polyp of rectum    8/12, Dr Fuller Plan, 5 yr follow  up   . Uterine fibroid 10/19/2017   noted on CT pelvis  . Vitamin D deficiency     PAST SURGICAL HISTORY: Past Surgical History:  Procedure Laterality Date  . AUGMENTATION MAMMAPLASTY Bilateral   . BREAST ENHANCEMENT SURGERY    . BREAST REDUCTION SURGERY    . COLONOSCOPY  01/19/2016  . DILATION AND CURETTAGE OF UTERUS N/A 06/24/2015   Procedure: DILATATION AND CURETTAGE;  Surgeon: Emily Filbert, MD;  Location: [facility];  Service: Gynecology;  Laterality: N/A;  . KNEE ARTHROSCOPY WITH SUBCHONDROPLASTY Right 05/03/2018   Procedure: RIGHT KNEE ARTHROSCOPY WITH DEBRIDEMENT, PARTIAL MEDIAL MENISCECTOMY SUBCHONDROPLASTY MEDIAL TIBIAL PLATEAU AND MEDIAL FEMORAL CONDYLE;  Surgeon: Mcarthur Rossetti, MD;  Location: [facility];  Service: Orthopedics;  Laterality: Right;  . REDUCTION MAMMAPLASTY Bilateral   . TOE SURGERY     removal of bone in each foot  . TUBAL LIGATION      SOCIAL HISTORY: Social History   Tobacco Use  . Smoking status: Former Smoker    Years: 7.00    Types: Cigars    Last attempt to quit: 08/11/2008    Years since quitting: 9.9  . Smokeless tobacco: Never Used  . Tobacco comment: smoked [employer], 1  pack per day (5 in a pack)  Substance Use Topics  . Alcohol use: Yes    Alcohol/week: 5.0 standard drinks    Types: 5 Cans of beer per week    Comment: occ   . Drug use: Never    FAMILY HISTORY: Family History  Problem Relation Age of Onset  . Hypertension Mother   . Heart disease Mother   . Hypertension Father   . Hypertension Sister   . Cancer Maternal Grandmother   . Stomach cancer Paternal Grandmother   . Hypertension Son   . Hypertension Son   . Colon cancer Neg Hx     ROS: Review of Systems  Constitutional: Positive for weight loss.  Gastrointestinal: Negative for diarrhea, nausea and vomiting.  Genitourinary: Negative for frequency.  Musculoskeletal:       Negative for muscle weakness  Endo/Heme/Allergies: Negative for polydipsia.       Negative for hypoglycemia Negative for polyphagia    PHYSICAL EXAM: Blood pressure 128/82, pulse 78, temperature 98.1 F (36.7 C), temperature source Oral, height 5\' 7"  (1.702 m), weight 293 lb (132.9 kg), last menstrual period 08/01/2012, SpO2 98 %. Body mass index is 45.89 kg/m. Physical Exam  RECENT LABS AND TESTS: BMET    Component Value Date/Time   NA 139 05/30/2018 1116   K 4.2 05/30/2018 1116   CL 101 05/30/2018 1116   CO2 23 05/30/2018 1116   GLUCOSE 86 05/30/2018 1116   GLUCOSE 104 (H) 04/26/2018 0837   BUN 13 05/30/2018 1116   CREATININE 0.73 05/30/2018 1116   CALCIUM 9.2 05/30/2018 1116   GFRNONAA 93 05/30/2018 1116   GFRAA 107 05/30/2018 1116   Lab Results  Component Value Date   HGBA1C 6.3 (H) 05/30/2018   HGBA1C 6.0 (A) 03/09/2018   Lab Results  Component Value Date   INSULIN 41.4 (H) 05/30/2018   CBC    Component Value Date/Time   WBC 6.4 05/30/2018 1116   WBC 6.0 04/26/2018 0837   RBC 4.57 05/30/2018 1116   RBC 4.71 04/26/2018 0837   HGB 11.6 05/30/2018 1116   HCT 37.0 05/30/2018 1116   PLT 173 05/30/2018 1116   MCV 81 05/30/2018 1116   MCH 25.4 (L) 05/30/2018 1116   MCH 25.9 (  L)  04/26/2018 0837   MCHC 31.4 (L) 05/30/2018 1116   MCHC 29.8 (L) 04/26/2018 0837   RDW 13.7 05/30/2018 1116   LYMPHSABS 2.4 05/30/2018 1116   MONOABS 0.4 06/19/2012 0936   EOSABS 0.1 05/30/2018 1116   BASOSABS 0.0 05/30/2018 1116   Iron/TIBC/Ferritin/ %Sat No results found for: IRON, TIBC, FERRITIN, IRONPCTSAT Lipid Panel     Component Value Date/Time   CHOL 134 05/30/2018 1116   TRIG 85 05/30/2018 1116   HDL 75 05/30/2018 1116   CHOLHDL 1.8 05/30/2018 1116   LDLCALC 42 05/30/2018 1116   Hepatic Function Panel     Component Value Date/Time   PROT 7.8 05/30/2018 1116   ALBUMIN 3.9 05/30/2018 1116   AST 29 05/30/2018 1116   ALT 40 (H) 05/30/2018 1116   ALKPHOS 91 05/30/2018 1116   BILITOT 0.5 05/30/2018 1116      Component Value Date/Time   TSH 1.280 05/30/2018 1116   TSH 1.220 04/26/2018 1647   TSH 1.738 04/06/2015 1520     Ref. Range 05/30/2018 11:16  Vitamin D, 25-Hydroxy Latest Ref Range: 30.0 - 100.0 ng/mL 19.7 (L)     OBESITY BEHAVIORAL INTERVENTION VISIT  Today's visit was # 5   Starting weight: 296 lbs Starting date: 05/30/2018 Today's weight : 293 lbs  Today's date: 07/19/2018 Total lbs lost to date: 3   ASK: We discussed the diagnosis of obesity with Michelle Huerta today and Michelle Huerta agreed to give [place] permission to discuss obesity behavioral modification therapy today.  ASSESS: Michelle Huerta has the diagnosis of obesity and her BMI today is 45.88 Michelle Huerta is in the action stage of change   ADVISE: Michelle Huerta was educated on the multiple health risks of obesity as well as the benefit of weight loss to improve her health. She was advised of the need for long term treatment and the importance of lifestyle modifications to improve her current health and to decrease her risk of future health problems.  AGREE: Multiple dietary modification options and treatment options were discussed and  [place] agreed to follow the recommendations documented in the above note.  ARRANGE: Caris  was educated on the importance of frequent visits to treat obesity as outlined per CMS and USPSTF guidelines and agreed to schedule her next follow up appointment today.  Corey Skains, am acting as Location manager for [employer], FNP-C.  I have reviewed the above documentation for accuracy and completeness, and I agree with the above.  - Geryl Dohn, FNP-C.

## 2018-07-25 DIAGNOSIS — N390 Urinary tract infection, site not specified: Secondary | ICD-10-CM | POA: Diagnosis not present

## 2018-07-25 DIAGNOSIS — N760 Acute vaginitis: Secondary | ICD-10-CM | POA: Diagnosis not present

## 2018-07-26 ENCOUNTER — Encounter (INDEPENDENT_AMBULATORY_CARE_PROVIDER_SITE_OTHER): Payer: Self-pay | Admitting: Family Medicine

## 2018-07-26 DIAGNOSIS — Z6841 Body Mass Index (BMI) 40.0 and over, adult: Secondary | ICD-10-CM

## 2018-07-26 DIAGNOSIS — N950 Postmenopausal bleeding: Secondary | ICD-10-CM | POA: Diagnosis not present

## 2018-07-26 DIAGNOSIS — E559 Vitamin D deficiency, unspecified: Secondary | ICD-10-CM | POA: Insufficient documentation

## 2018-07-26 DIAGNOSIS — Z01419 Encounter for gynecological examination (general) (routine) without abnormal findings: Secondary | ICD-10-CM | POA: Diagnosis not present

## 2018-07-26 DIAGNOSIS — N952 Postmenopausal atrophic vaginitis: Secondary | ICD-10-CM | POA: Diagnosis not present

## 2018-07-30 ENCOUNTER — Encounter (INDEPENDENT_AMBULATORY_CARE_PROVIDER_SITE_OTHER): Payer: Self-pay | Admitting: Orthopaedic Surgery

## 2018-07-30 ENCOUNTER — Other Ambulatory Visit (INDEPENDENT_AMBULATORY_CARE_PROVIDER_SITE_OTHER): Payer: Self-pay | Admitting: Family Medicine

## 2018-07-30 ENCOUNTER — Ambulatory Visit (INDEPENDENT_AMBULATORY_CARE_PROVIDER_SITE_OTHER): Payer: 59 | Admitting: Orthopaedic Surgery

## 2018-07-30 VITALS — Ht 66.5 in | Wt 296.0 lb

## 2018-07-30 DIAGNOSIS — M1711 Unilateral primary osteoarthritis, right knee: Secondary | ICD-10-CM

## 2018-07-30 DIAGNOSIS — R7303 Prediabetes: Secondary | ICD-10-CM

## 2018-07-30 DIAGNOSIS — Z9889 Other specified postprocedural states: Secondary | ICD-10-CM

## 2018-07-30 NOTE — Progress Notes (Signed)
The patient is in continued follow-up for her right knee.  She is just over a month out from a Monovisc injection with hyaluronic acid into the right knee.  She is also status post a right knee arthroscopy with sub-chondroplasty.  She is 58 years old.  She is now going to the weight loss clinic and working on getting her BMI down.  I do feel that she is a candidate for knee replacement at some point but she has to have a BMI of 40 or below especially with being a patient who is insured under [facility].  That insurance plan does not allow for any joint replacement in patients who have a BMI of 40 due to the potential for complications.  I explained this to her in detail.  In our office today she does weigh 296 pounds but this is with her shoes and socks on and close.  Still, this is a BMI of 47.  On exam, the right knee has no swelling but does have medial joint line tenderness to be expected given the extent of her medial compartment arthritis.  I will give her a note to keep her out of work until the end of February.  Should continue work on weight loss and quad strengthening exercises.  At her return visit we can always place a steroid injection in her knee then if needed.

## 2018-08-01 ENCOUNTER — Ambulatory Visit (INDEPENDENT_AMBULATORY_CARE_PROVIDER_SITE_OTHER): Payer: 59 | Admitting: Family Medicine

## 2018-08-01 ENCOUNTER — Encounter (INDEPENDENT_AMBULATORY_CARE_PROVIDER_SITE_OTHER): Payer: Self-pay | Admitting: Family Medicine

## 2018-08-01 VITALS — BP 143/85 | HR 80 | Temp 98.5°F | Ht 67.0 in | Wt 293.0 lb

## 2018-08-01 DIAGNOSIS — E559 Vitamin D deficiency, unspecified: Secondary | ICD-10-CM | POA: Diagnosis not present

## 2018-08-01 DIAGNOSIS — R7303 Prediabetes: Secondary | ICD-10-CM

## 2018-08-01 DIAGNOSIS — Z6841 Body Mass Index (BMI) 40.0 and over, adult: Secondary | ICD-10-CM | POA: Diagnosis not present

## 2018-08-01 DIAGNOSIS — Z9189 Other specified personal risk factors, not elsewhere classified: Secondary | ICD-10-CM | POA: Diagnosis not present

## 2018-08-01 MED ORDER — METFORMIN HCL 500 MG PO TABS: 500.0000 mg | ORAL_TABLET | Freq: Every day | ORAL | 0 refills | Status: DC

## 2018-08-01 MED ORDER — VITAMIN D (ERGOCALCIFEROL) 1.25 MG (50000 UNIT) PO CAPS: 50000.0000 [IU] | ORAL_CAPSULE | ORAL | 0 refills | Status: DC

## 2018-08-02 MED FILL — VALACYCLOVIR HCL 500 MG TAB: 500 | 90 days supply | Qty: 90 | Fill #1

## 2018-08-02 MED FILL — AMLODIPINE BESYLATE 5 MG TA: 5 | 90 days supply | Qty: 90 | Fill #1

## 2018-08-02 MED FILL — TELMISARTAN 80 MG TABS: 80 | 90 days supply | Qty: 90 | Fill #1

## 2018-08-02 NOTE — Progress Notes (Signed)
Office: [phone]  /  Fax: [phone]   HPI:   Chief Complaint: OBESITY Michelle Huerta is here to discuss her progress with her obesity treatment plan. She is on the portion control better and make smarter food choices, such as increase vegetables and decrease simple carbohydrates and is following her eating plan approximately 10 % of the time. She states she is exercising 0 minutes 0 times per week. Michelle Huerta has had a lot on her plate recently. She is trying to get approved for disability for knee problems. She works in housekeeping at [employer]. She has not been following the plan and she reports that she wants to start following  the plan.  Her weight is 293 lb (132.9 kg) today and has not lost weight since her last visit. She has lost 3 lbs since starting treatment with [place].  Pre-Diabetes Michelle Huerta has a diagnosis of pre-diabetes based on her elevated Hgb A1c and was informed this puts her at greater risk of developing diabetes. She is taking metformin currently and continues to work on diet and exercise to decrease risk of diabetes. She denies polyphagia or hypoglycemia.  Vitamin D deficiency Michelle Huerta has a diagnosis of vitamin D deficiency. She is currently taking vit D and is not at goal. Her last vitamin D level was 19.7 on 05/30/18. She denies nausea, vomiting, or muscle weakness.  At risk for osteopenia and osteoporosis Michelle Huerta is at higher risk of osteopenia and osteoporosis due to vitamin D deficiency.   ASSESSMENT AND PLAN:  Prediabetes - Plan: metFORMIN (GLUCOPHAGE) 500 MG tablet  Vitamin D deficiency - Plan: Vitamin D, Ergocalciferol, (DRISDOL) 1.25 MG (50000 UT) CAPS capsule  At risk for osteoporosis  Class 3 severe obesity with serious comorbidity and body mass index (BMI) of 45.0 to 49.9 in adult, unspecified obesity type [facility])  PLAN:  Pre-Diabetes [name] will continue to work on weight loss, exercise, and decreasing simple carbohydrates in her diet to help decrease the risk of diabetes. She  was informed that eating too many simple carbohydrates or too many calories at one sitting increases the likelihood of GI side effects. [name] agreed to continue taking metformin 500mg  qAM #30 with no refills and a prescription was written today. [name] agreed to follow up with [place] as directed to monitor her progress in 2 weeks.  Vitamin D Deficiency Michelle Huerta was informed that low vitamin D levels contributes to fatigue and are associated with obesity, breast, and colon cancer. She agrees to continue to take prescription Vit D @50 ,000 IU every week #4 with no refills and will follow up for routine testing of vitamin D, at least 2-3 times per year. She was informed of the risk of over-replacement of vitamin D and agrees to not increase her dose unless she discusses this with [place] first. Michelle Huerta agrees to follow up as directed.  At risk for osteopenia and osteoporosis Michelle Huerta was given extended (15 minutes) osteoporosis prevention counseling today. Michelle Huerta is at risk for osteopenia and osteoporosis due to her vitamin D deficiency. She was encouraged to take her vitamin D and follow her higher calcium diet and increase strengthening exercise to help strengthen her bones and decrease her risk of osteopenia and osteoporosis.  Obesity Michelle Huerta is currently in the action stage of change. As such, her goal is to continue with weight loss efforts. She has agreed to portion control better and make smarter food choices, such as increase vegetables and decrease simple carbohydrates.  We discussed the following Behavioral Modification Strategies today: increasing lean  protein intake, work on meal planning and easy cooking plans, better snacking choices, and planning for success. Michelle Huerta has not been prescribed exercise at this time.  Michelle Huerta has agreed to follow up with our clinic in 2 weeks. She was informed of the importance of frequent follow up visits to maximize her success with intensive lifestyle modifications for her multiple health  conditions.  ALLERGIES: Allergies  Allergen Reactions  . Penicillins Rash and Other (See Comments)    Has patient had a PCN reaction causing immediate rash, facial/tongue/throat swelling, SOB or lightheadedness with hypotension: Yes Has patient had a PCN reaction causing severe rash involving mucus membranes or skin necrosis: No Has patient had a PCN reaction that required hospitalization: No Has patient had a PCN reaction occurring within the last 10 years: No If all of the above answers are "NO", then may proceed with Cephalosporin use.   [name] Reclast [Zoledronic Acid] Nausea And Vomiting    MEDICATIONS: Current Outpatient Medications on File Prior to Visit  Medication Sig Dispense Refill  . amLODipine (NORVASC) 5 MG tablet Take 1 tablet (5 mg total) by mouth daily. 90 tablet 3  . cetirizine (ZYRTEC) 10 MG tablet Take 10 mg by mouth daily.    . Estradiol 10 MCG INST Place 10 mcg vaginally 2 (two) times a week. 12 each 1  . hydrOXYzine (ATARAX/VISTARIL) 50 MG tablet Take 0.5-1 tablets (25-50 mg total) by mouth at bedtime as needed (insomnia.). 60 tablet 1  . ibuprofen (ADVIL,MOTRIN) 800 MG tablet Take 1 tablet (800 mg total) by mouth every 8 (eight) hours as needed. 90 tablet 0  . telmisartan (MICARDIS) 80 MG tablet Take 1 tablet (80 mg total) by mouth daily. 90 tablet 3  . traZODone (DESYREL) 100 MG tablet Take 1-3 tablets (100-300 mg total) by mouth at bedtime as needed for sleep. 90 tablet 1  . valACYclovir (VALTREX) 500 MG tablet Take 1 tablet (500 mg total) by mouth daily. 90 tablet 3   No current facility-administered medications on file prior to visit.     PAST MEDICAL HISTORY: Past Medical History:  Diagnosis Date  . Allergy   . Arthritis of both knees   . Bilateral chronic knee pain   . Chronic back pain   . Constipation   . DDD (degenerative disc disease), cervical   . Depression    no meds  . Genital herpes    HSV type 2 at rectum, culture positive 3/15  . Glaucoma    . Grade I diastolic dysfunction 71/12/2692   noted on ECHO   . Heart murmur   . History of colon polyps   . HLD (hyperlipidemia)   . Hypertension   . Hypothyroidism   . Insomnia   . Internal hemorrhoids   . Lactose intolerance   . Lupus ([place])   . LVH (left ventricular hypertrophy) 06/25/2015   Mild, noted on ECHO   . Mild sleep apnea    1/17 CPAP Dr Claiborne Billings does not use cpap   . Morbid obesity ([place]) 06/11/2015  . Osteoporosis   . Paget's disease   . Paget's disease of bone    Dr Trudie Reed, Reclast insufion 02/14/10 - bad reaction to infusion, now tolerates fosamax weekly  . Plantar fasciitis, bilateral   . PMB (postmenopausal bleeding)   . Pre-diabetes   . Sleep apnea   . Thrombocytopenia ([place])   . TR (tricuspid regurgitation) 06/25/2015   Trace, noted on ECHO   . Tubular adenoma polyp of rectum  8/12, Dr Fuller Plan, 5 yr follow up   . Uterine fibroid 10/19/2017   noted on CT pelvis  . Vitamin D deficiency     PAST SURGICAL HISTORY: Past Surgical History:  Procedure Laterality Date  . AUGMENTATION MAMMAPLASTY Bilateral   . BREAST ENHANCEMENT SURGERY    . BREAST REDUCTION SURGERY    . COLONOSCOPY  01/19/2016  . DILATION AND CURETTAGE OF UTERUS N/A 06/24/2015   Procedure: DILATATION AND CURETTAGE;  Surgeon: Emily Filbert, MD;  Location: [facility];  Service: Gynecology;  Laterality: N/A;  . KNEE ARTHROSCOPY WITH SUBCHONDROPLASTY Right 05/03/2018   Procedure: RIGHT KNEE ARTHROSCOPY WITH DEBRIDEMENT, PARTIAL MEDIAL MENISCECTOMY SUBCHONDROPLASTY MEDIAL TIBIAL PLATEAU AND MEDIAL FEMORAL CONDYLE;  Surgeon: Mcarthur Rossetti, MD;  Location: [facility];  Service: Orthopedics;  Laterality: Right;  . REDUCTION MAMMAPLASTY Bilateral   . TOE SURGERY     removal of bone in each foot  . TUBAL LIGATION      SOCIAL HISTORY: Social History   Tobacco Use  . Smoking status: Former Smoker    Years: 7.00    Types: Cigars    Last attempt to quit: 08/11/2008    Years since quitting: 9.9  .  Smokeless tobacco: Never Used  . Tobacco comment: smoked Black&Milds, 1 pack per day (5 in a pack)  Substance Use Topics  . Alcohol use: Yes    Alcohol/week: 5.0 standard drinks    Types: 5 Cans of beer per week    Comment: occ   . Drug use: Never    FAMILY HISTORY: Family History  Problem Relation Age of Onset  . Hypertension Mother   . Heart disease Mother   . Hypertension Father   . Hypertension Sister   . Cancer Maternal Grandmother   . Stomach cancer Paternal Grandmother   . Hypertension Son   . Hypertension Son   . Colon cancer Neg Hx    ROS: Review of Systems  Constitutional: Negative for weight loss.  Gastrointestinal: Negative for nausea and vomiting.  Musculoskeletal:       Negative for muscle weakness.  Endo/Heme/Allergies:       Negative for polyphagia. Negative for hypoglycemia.   PHYSICAL EXAM: Blood pressure (!) 143/85, pulse 80, temperature 98.5 F (36.9 C), temperature source Oral, height 5\' 7"  (1.702 m), weight 293 lb (132.9 kg), last menstrual period 08/01/2012, SpO2 97 %. Body mass index is 45.89 kg/m. Physical Exam Vitals signs reviewed.  Constitutional:      Appearance: Normal appearance. She is obese.  Cardiovascular:     Rate and Rhythm: Normal rate.  Pulmonary:     Effort: Pulmonary effort is normal.  Musculoskeletal: Normal range of motion.  Skin:    General: Skin is warm and dry.  Neurological:     Mental Status: She is alert and oriented to person, place, and time.  Psychiatric:        Mood and Affect: Mood normal.        Behavior: Behavior normal.    RECENT LABS AND TESTS: BMET    Component Value Date/Time   NA 139 05/30/2018 1116   K 4.2 05/30/2018 1116   CL 101 05/30/2018 1116   CO2 23 05/30/2018 1116   GLUCOSE 86 05/30/2018 1116   GLUCOSE 104 (H) 04/26/2018 0837   BUN 13 05/30/2018 1116   CREATININE 0.73 05/30/2018 1116   CALCIUM 9.2 05/30/2018 1116   GFRNONAA 93 05/30/2018 1116   GFRAA 107 05/30/2018 1116   Lab  Results  Component  Value Date   HGBA1C 6.3 (H) 05/30/2018   HGBA1C 6.0 (A) 03/09/2018   Lab Results  Component Value Date   INSULIN 41.4 (H) 05/30/2018   CBC    Component Value Date/Time   WBC 6.4 05/30/2018 1116   WBC 6.0 04/26/2018 0837   RBC 4.57 05/30/2018 1116   RBC 4.71 04/26/2018 0837   HGB 11.6 05/30/2018 1116   HCT 37.0 05/30/2018 1116   PLT 173 05/30/2018 1116   MCV 81 05/30/2018 1116   MCH 25.4 (L) 05/30/2018 1116   MCH 25.9 (L) 04/26/2018 0837   MCHC 31.4 (L) 05/30/2018 1116   MCHC 29.8 (L) 04/26/2018 0837   RDW 13.7 05/30/2018 1116   LYMPHSABS 2.4 05/30/2018 1116   MONOABS 0.4 06/19/2012 0936   EOSABS 0.1 05/30/2018 1116   BASOSABS 0.0 05/30/2018 1116   Iron/TIBC/Ferritin/ %Sat No results found for: IRON, TIBC, FERRITIN, IRONPCTSAT Lipid Panel     Component Value Date/Time   CHOL 134 05/30/2018 1116   TRIG 85 05/30/2018 1116   HDL 75 05/30/2018 1116   CHOLHDL 1.8 05/30/2018 1116   LDLCALC 42 05/30/2018 1116   Hepatic Function Panel     Component Value Date/Time   PROT 7.8 05/30/2018 1116   ALBUMIN 3.9 05/30/2018 1116   AST 29 05/30/2018 1116   ALT 40 (H) 05/30/2018 1116   ALKPHOS 91 05/30/2018 1116   BILITOT 0.5 05/30/2018 1116      Component Value Date/Time   TSH 1.280 05/30/2018 1116   TSH 1.220 04/26/2018 1647   TSH 1.738 04/06/2015 1520   Results for Devaux, Albena L (MRN [id number]) as of 08/02/2018 08:14  Ref. Range 05/30/2018 11:16  Vitamin D, 25-Hydroxy Latest Ref Range: 30.0 - 100.0 ng/mL 19.7 (L)   OBESITY BEHAVIORAL INTERVENTION VISIT  Today's visit was # 6   Starting weight: 296 lbs Starting date: 05/30/18 Today's weight : Weight: 293 lb (132.9 kg)  Today's date: 08/01/2018 Total lbs lost to date: 3  ASK: We discussed the diagnosis of obesity with [name] today and Lowen agreed to give [place] permission to discuss obesity behavioral modification therapy today.  ASSESS: Amberlie has the diagnosis of obesity and her BMI today is  45.8. Jacquelyn is in the action stage of change.   ADVISE: Addilynne was educated on the multiple health risks of obesity as well as the benefit of weight loss to improve her health. She was advised of the need for long term treatment and the importance of lifestyle modifications to improve her current health and to decrease her risk of future health problems.  AGREE: Multiple dietary modification options and treatment options were discussed and Aylee agreed to follow the recommendations documented in the above note.  ARRANGE: Arsema was educated on the importance of frequent visits to treat obesity as outlined per CMS and USPSTF guidelines and agreed to schedule her next follow up appointment today.  I, Marcille Blanco, am acting as Location manager for [employer], FNP-C.  I have reviewed the above documentation for accuracy and completeness, and I agree with the above.  - Anani Gu, FNP-C.

## 2018-08-06 ENCOUNTER — Encounter (INDEPENDENT_AMBULATORY_CARE_PROVIDER_SITE_OTHER): Payer: Self-pay | Admitting: Family Medicine

## 2018-08-06 ENCOUNTER — Ambulatory Visit (INDEPENDENT_AMBULATORY_CARE_PROVIDER_SITE_OTHER): Payer: Self-pay | Admitting: Orthopaedic Surgery

## 2018-08-06 ENCOUNTER — Ambulatory Visit: Payer: 59

## 2018-08-06 MED FILL — VIT D2 1.25 MG (50,000 UNIT: 1.25 MG | 28 days supply | Qty: 4 | Fill #0

## 2018-08-06 MED FILL — metFORMIN HCL 500 MG TABS: 500 | 30 days supply | Qty: 30 | Fill #0

## 2018-08-08 DIAGNOSIS — L930 Discoid lupus erythematosus: Secondary | ICD-10-CM | POA: Diagnosis not present

## 2018-08-08 DIAGNOSIS — M25569 Pain in unspecified knee: Secondary | ICD-10-CM | POA: Diagnosis not present

## 2018-08-08 DIAGNOSIS — M1990 Unspecified osteoarthritis, unspecified site: Secondary | ICD-10-CM | POA: Diagnosis not present

## 2018-08-08 DIAGNOSIS — M170 Bilateral primary osteoarthritis of knee: Secondary | ICD-10-CM | POA: Diagnosis not present

## 2018-08-08 DIAGNOSIS — M25562 Pain in left knee: Secondary | ICD-10-CM | POA: Diagnosis not present

## 2018-08-08 DIAGNOSIS — M889 Osteitis deformans of unspecified bone: Secondary | ICD-10-CM | POA: Diagnosis not present

## 2018-08-08 DIAGNOSIS — M79604 Pain in right leg: Secondary | ICD-10-CM | POA: Diagnosis not present

## 2018-08-08 DIAGNOSIS — M25511 Pain in right shoulder: Secondary | ICD-10-CM | POA: Diagnosis not present

## 2018-08-10 DIAGNOSIS — N950 Postmenopausal bleeding: Secondary | ICD-10-CM | POA: Diagnosis not present

## 2018-08-10 DIAGNOSIS — N840 Polyp of corpus uteri: Secondary | ICD-10-CM | POA: Diagnosis not present

## 2018-08-10 DIAGNOSIS — Z1231 Encounter for screening mammogram for malignant neoplasm of breast: Secondary | ICD-10-CM | POA: Diagnosis not present

## 2018-08-13 ENCOUNTER — Other Ambulatory Visit: Payer: Self-pay | Admitting: Obstetrics and Gynecology

## 2018-08-13 DIAGNOSIS — R928 Other abnormal and inconclusive findings on diagnostic imaging of breast: Secondary | ICD-10-CM

## 2018-08-15 ENCOUNTER — Ambulatory Visit (INDEPENDENT_AMBULATORY_CARE_PROVIDER_SITE_OTHER): Payer: Self-pay | Admitting: Family Medicine

## 2018-08-15 ENCOUNTER — Encounter (INDEPENDENT_AMBULATORY_CARE_PROVIDER_SITE_OTHER): Payer: Self-pay

## 2018-08-16 MED FILL — TEMAZEPAM 30 MG CAPSULE: 30 | 30 days supply | Qty: 30 | Fill #0

## 2018-08-20 ENCOUNTER — Ambulatory Visit: Payer: 59

## 2018-08-20 ENCOUNTER — Ambulatory Visit
Admission: RE | Admit: 2018-08-20 | Discharge: 2018-08-20 | Disposition: A | Discharge status: Home / Self Care | Payer: 59 | Source: Ambulatory Visit | Attending: Obstetrics and Gynecology | Admitting: Obstetrics and Gynecology

## 2018-08-20 DIAGNOSIS — R928 Other abnormal and inconclusive findings on diagnostic imaging of breast: Secondary | ICD-10-CM | POA: Diagnosis not present

## 2018-08-22 DIAGNOSIS — Z01812 Encounter for preprocedural laboratory examination: Secondary | ICD-10-CM | POA: Diagnosis not present

## 2018-08-22 DIAGNOSIS — N840 Polyp of corpus uteri: Secondary | ICD-10-CM | POA: Diagnosis not present

## 2018-08-22 DIAGNOSIS — N950 Postmenopausal bleeding: Secondary | ICD-10-CM | POA: Diagnosis not present

## 2018-08-28 ENCOUNTER — Encounter (INDEPENDENT_AMBULATORY_CARE_PROVIDER_SITE_OTHER): Payer: Self-pay | Admitting: Orthopaedic Surgery

## 2018-08-28 ENCOUNTER — Ambulatory Visit (INDEPENDENT_AMBULATORY_CARE_PROVIDER_SITE_OTHER): Payer: 59

## 2018-08-28 ENCOUNTER — Ambulatory Visit (INDEPENDENT_AMBULATORY_CARE_PROVIDER_SITE_OTHER): Payer: 59 | Admitting: Orthopaedic Surgery

## 2018-08-28 DIAGNOSIS — M1711 Unilateral primary osteoarthritis, right knee: Secondary | ICD-10-CM | POA: Diagnosis not present

## 2018-08-28 MED ORDER — METHYLPREDNISOLONE ACETATE 40 MG/ML IJ SUSP
40.0000 mg | INTRAMUSCULAR | Status: AC | PRN
  Administered 2018-08-28: 40 mg via INTRA_ARTICULAR

## 2018-08-28 MED ORDER — LIDOCAINE HCL 1 % IJ SOLN
3.0000 mL | INTRAMUSCULAR | Status: AC | PRN
  Administered 2018-08-28: 3 mL

## 2018-08-28 NOTE — Progress Notes (Signed)
Office Visit Note   Patient: Michelle Huerta           Date of Birth: [birth year]           MRN: [id number] Visit Date: 08/28/2018              Requested by: Forrest Moron, MD [place], [place] [id number] PCP: Forrest Moron, MD   Assessment & Plan: Visit Diagnoses:  1. Primary osteoarthritis of right knee     Plan: She will continue to work on weight loss.  Also discussed with her quad strengthening as she shown quad strengthening exercises she can perform on her own.  She understands that she can have cortisone injections no more often than every 3 months in the right knee and supplemental injection no more often than every 6 months.  She will call at least a month before having Monovisc injection to allow time for approval.  Did write a note to return to work on 09/06/2018 full duties.  Follow-Up Instructions: Return if symptoms worsen or fail to improve.   Orders:  Orders Placed This Encounter  Procedures  . Large Joint Inj  . XR Knee 1-2 Views Right   No orders of the defined types were placed in this encounter.     Procedures: Large Joint Inj: R knee on 08/28/2018 9:45 AM Indications: pain Details: 22 G 1.5 in needle, anterolateral approach  Arthrogram: No  Medications: 3 mL lidocaine 1 %; 40 mg methylPREDNISolone acetate 40 MG/ML Outcome: tolerated well, no immediate complications Procedure, treatment alternatives, risks and benefits explained, specific risks discussed. Consent was given by the patient. Immediately prior to procedure a time out was called to verify the correct patient, procedure, equipment, support staff and site/side marked as required. Patient was prepped and draped in the usual sterile fashion.       Clinical Data: No additional findings.   Subjective: Chief Complaint  Patient presents with  . Right Knee - Follow-up    HPI Mrs. Michelle Huerta returns today follow-up of her right knee which she has known tricompartmental arthritis.  She  continues to have pain in the right knee she had Monovisc injection 06/25/2018.  States it helps some.  But she is up on the leg for too long has pain.  She is status post right knee arthroscopy with subchondral plasty 05/03/2018.  She would like for repeat radiographs of her right knee to be performed today.  She is planning to go back to work next Thursday.  She is concerned due to the knee pain.  She is wanting to have a cortisone injection in the right knee if possible today. She continues to work on weight loss with portion control and diet.  Reports that she has lost down to 289 pounds.  Review of Systems See HPI otherwise negative  Objective: Vital Signs: LMP 08/01/2012   Physical Exam Constitutional:      Appearance: She is obese. She is not ill-appearing or diaphoretic.  Pulmonary:     Effort: Pulmonary effort is normal.  Neurological:     Mental Status: She is alert and oriented to person, place, and time.     Ortho Exam Ambulates without any assistive devices nonantalgic gait.  Good range of motion of the right knee no effusion abnormal warmth erythema.  Positive patellofemoral crepitus.  Tenderness medial joint line. Specialty Comments:  No specialty comments available.  Imaging: Xr Knee 1-2 Views Right  Result Date: 08/28/2018 AP lateral view  right knee: Compared to films October 2019 there is been no change in overall arthritic changes.  Still with near bone-on-bone medial compartment.  Mild lateral compartment and mild to moderate patellofemoral changes.  Changes in the medial femoral condyle and tibial plateau status post subpatellar plasty were seen.  No collapse of femoral condyle or the tibial plateau seen.    PMFS History: Patient Active Problem List   Diagnosis Date Noted  . Vitamin D deficiency 07/26/2018  . Class 3 severe obesity with serious comorbidity and body mass index (BMI) of 45.0 to 49.9 in adult ([place]) 07/26/2018  . Status post arthroscopic surgery  of right knee 05/09/2018  . Chronic pain of right knee 05/09/2018  . Closed fracture of right tibial plateau   . Unilateral primary osteoarthritis, right knee 04/23/2018  . Acute medial meniscus tear of right knee 04/23/2018  . Closed fracture of medial plateau of left tibia 04/23/2018  . Severe obesity (BMI >= 40) ([place]) 04/23/2018  . Osteoarthritis of right knee 03/09/2018  . Prediabetes 03/09/2018  . Bilateral knee pain 12/28/2017  . Constipation 12/18/2017  . Cough 12/15/2017  . Lower resp. tract infection 12/15/2017  . Acute conjunctivitis of right eye 12/15/2017  . H/O Paget's disease of bone 09/10/2017  . OSA on CPAP 10/28/2015  . Fibroids 08/07/2015  . Plantar fasciitis, bilateral 08/03/2015  . Metatarsal deformity 08/03/2015  . Equinus deformity of foot, acquired 08/03/2015  . Pronation deformity of both feet 08/03/2015  . Morbid obesity ([place]) 06/11/2015  . 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 Essential (primary) hypertension 06/11/2015  . 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 Benign essential HTN 09/19/2013  . Major depressive episode 09/19/2013   Past Medical History:  Diagnosis Date  . Allergy   . Arthritis of both knees   . Bilateral chronic knee pain   . Chronic back pain   . Constipation   . DDD (degenerative disc disease), cervical   . Depression    no meds  . Genital herpes    HSV type 2 at rectum, culture positive 3/15  . Glaucoma   . Grade I diastolic dysfunction 00/17/4944   noted on ECHO   . Heart murmur   . History of colon polyps   . HLD (hyperlipidemia)   . Hypertension   . Hypothyroidism   . Insomnia   . Internal hemorrhoids   . Lactose intolerance   . Lupus ([place])   . LVH (left ventricular hypertrophy) 06/25/2015   Mild, noted on ECHO   . Mild sleep apnea    1/17 CPAP Dr Claiborne Billings does not use cpap   . Morbid obesity ([place]) 06/11/2015  . Osteoporosis   . Paget's disease   . Paget's disease of bone    Dr Trudie Reed, Reclast insufion 02/14/10  - bad reaction to infusion, now tolerates fosamax weekly  . Plantar fasciitis, bilateral   . PMB (postmenopausal bleeding)   . Pre-diabetes   . Sleep apnea   . Thrombocytopenia ([place])   . TR (tricuspid regurgitation) 06/25/2015   Trace, noted on ECHO   . Tubular adenoma polyp of rectum    8/12, Dr Fuller Plan, 5 yr follow up   . Uterine fibroid 10/19/2017   noted on CT pelvis  . Vitamin D deficiency     Family History  Problem Relation Age of Onset  . Hypertension Mother   . Heart disease Mother   . Hypertension Father   . Hypertension Sister   . Cancer Maternal Grandmother   .  Stomach cancer Paternal Grandmother   . Hypertension Son   . Hypertension Son   . Colon cancer Neg Hx     Past Surgical History:  Procedure Laterality Date  . AUGMENTATION MAMMAPLASTY Bilateral   . BREAST ENHANCEMENT SURGERY    . BREAST REDUCTION SURGERY    . COLONOSCOPY  01/19/2016  . DILATION AND CURETTAGE OF UTERUS N/A 06/24/2015   Procedure: DILATATION AND CURETTAGE;  Surgeon: Emily Filbert, MD;  Location: [facility];  Service: Gynecology;  Laterality: N/A;  . KNEE ARTHROSCOPY WITH SUBCHONDROPLASTY Right 05/03/2018   Procedure: RIGHT KNEE ARTHROSCOPY WITH DEBRIDEMENT, PARTIAL MEDIAL MENISCECTOMY SUBCHONDROPLASTY MEDIAL TIBIAL PLATEAU AND MEDIAL FEMORAL CONDYLE;  Surgeon: Mcarthur Rossetti, MD;  Location: [facility];  Service: Orthopedics;  Laterality: Right;  . REDUCTION MAMMAPLASTY Bilateral   . TOE SURGERY     removal of bone in each foot  . TUBAL LIGATION     Social History   Occupational History  . Occupation: Housekeeping- [employer]  Tobacco Use  . Smoking status: Former Smoker    Years: 7.00    Types: Cigars    Last attempt to quit: 08/11/2008    Years since quitting: 10.0  . Smokeless tobacco: Never Used  . Tobacco comment: smoked Black&Milds, 1 pack per day (5 in a pack)  Substance and Sexual Activity  . Alcohol use: Yes    Alcohol/week: 5.0 standard drinks    Types: 5 Cans of beer per week     Comment: occ   . Drug use: Never  . Sexual activity: Not Currently    Partners: Male    Birth control/protection: Surgical

## 2018-09-03 ENCOUNTER — Encounter: Payer: Self-pay | Admitting: Family Medicine

## 2018-09-03 ENCOUNTER — Ambulatory Visit: Payer: 59 | Admitting: Family Medicine

## 2018-09-03 ENCOUNTER — Ambulatory Visit (INDEPENDENT_AMBULATORY_CARE_PROVIDER_SITE_OTHER): Payer: 59

## 2018-09-03 ENCOUNTER — Other Ambulatory Visit: Payer: Self-pay

## 2018-09-03 VITALS — BP 133/74 | HR 88 | Temp 98.3°F | Resp 12 | Ht 67.0 in | Wt 296.2 lb

## 2018-09-03 DIAGNOSIS — S60111A Contusion of right thumb with damage to nail, initial encounter: Secondary | ICD-10-CM

## 2018-09-03 DIAGNOSIS — S60011A Contusion of right thumb without damage to nail, initial encounter: Secondary | ICD-10-CM

## 2018-09-03 DIAGNOSIS — R059 Cough, unspecified: Secondary | ICD-10-CM

## 2018-09-03 DIAGNOSIS — J069 Acute upper respiratory infection, unspecified: Secondary | ICD-10-CM | POA: Diagnosis not present

## 2018-09-03 DIAGNOSIS — S6010XA Contusion of unspecified finger with damage to nail, initial encounter: Secondary | ICD-10-CM

## 2018-09-03 DIAGNOSIS — R05 Cough: Secondary | ICD-10-CM

## 2018-09-03 DIAGNOSIS — M79644 Pain in right finger(s): Secondary | ICD-10-CM | POA: Diagnosis not present

## 2018-09-03 DIAGNOSIS — S6991XA Unspecified injury of right wrist, hand and finger(s), initial encounter: Secondary | ICD-10-CM | POA: Diagnosis not present

## 2018-09-03 MED ORDER — PROMETHAZINE-CODEINE 6.25-10 MG/5ML PO SOLN: 5.0000 mL | Freq: Every evening | ORAL | 0 refills | Status: DC | PRN

## 2018-09-03 MED FILL — PROMETHAZINE W/COD SYRUP: 6.25-10 | 36 days supply | Qty: 180 | Fill #0

## 2018-09-03 NOTE — Progress Notes (Signed)
Subjective:    Patient ID: Michelle Huerta, female    DOB: 1960-10-11, 58 y.o.   MRN: [id number]  HPI CHERESA SIERS is a 58 y.o. female Presents today for: Chief Complaint  Patient presents with  . URI    cough, sorethroat, drainagex 3 days. Want to see if we can give her cough med with the codiene in it so can help sleep  . Hand Pain    shut right thumb in the car door and would like for Dr to take a look at it. DOI- 08/31/18    URI: Sore throat, drainage, cough. Congestion. Past 3 days. No fever. No known sick contacts. Tx:  otc tussin. Cough is still keeping up at night. Would like codeine cough syrup. Denies side effects with codeine, no recent narcotics.   R thumb pain: R hand dominant.  Slammed in car door 3 days ago. Some bruising at base of nail. Able to bend it ok. Still some soreness - better. Has good ROM/function.   Controlled substance database reviewed, temazepam 30 mg on February 6, last hydrocodone in October 2019 for #30.  Last promethazine codeine syrup in June 2018.    Patient Active Problem List   Diagnosis Date Noted  . Vitamin D deficiency 07/26/2018  . Class 3 severe obesity with serious comorbidity and body mass index (BMI) of 45.0 to 49.9 in adult ([place]) 07/26/2018  . Status post arthroscopic surgery of right knee 05/09/2018  . Chronic pain of right knee 05/09/2018  . Closed fracture of right tibial plateau   . Unilateral primary osteoarthritis, right knee 04/23/2018  . Acute medial meniscus tear of right knee 04/23/2018  . Closed fracture of medial plateau of left tibia 04/23/2018  . Severe obesity (BMI >= 40) ([place]) 04/23/2018  . Osteoarthritis of right knee 03/09/2018  . Prediabetes 03/09/2018  . Bilateral knee pain 12/28/2017  . Constipation 12/18/2017  . Cough 12/15/2017  . Lower resp. tract infection 12/15/2017  . Acute conjunctivitis of right eye 12/15/2017  . H/O Paget's disease of bone 09/10/2017  . OSA on CPAP 10/28/2015  . Fibroids  08/07/2015  . Plantar fasciitis, bilateral 08/03/2015  . Metatarsal deformity 08/03/2015  . Equinus deformity of foot, acquired 08/03/2015  . Pronation deformity of both feet 08/03/2015  . Morbid obesity ([place]) 06/11/2015  . 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 Essential (primary) hypertension 06/11/2015  . 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 Benign essential HTN 09/19/2013  . Major depressive episode 09/19/2013   Past Medical History:  Diagnosis Date  . Allergy   . Arthritis of both knees   . Bilateral chronic knee pain   . Chronic back pain   . Constipation   . DDD (degenerative disc disease), cervical   . Depression    no meds  . Genital herpes    HSV type 2 at rectum, culture positive 3/15  . Glaucoma   . Grade I diastolic dysfunction 37/85/8850   noted on ECHO   . Heart murmur   . History of colon polyps   . HLD (hyperlipidemia)   . Hypertension   . Hypothyroidism   . Insomnia   . Internal hemorrhoids   . Lactose intolerance   . Lupus ([place])   . LVH (left ventricular hypertrophy) 06/25/2015   Mild, noted on ECHO   . Mild sleep apnea    1/17 CPAP Dr Claiborne Billings does not use cpap   . Morbid obesity ([place]) 06/11/2015  . Osteoporosis   .  Paget's disease   . Paget's disease of bone    Dr Trudie Reed, Reclast insufion 02/14/10 - bad reaction to infusion, now tolerates fosamax weekly  . Plantar fasciitis, bilateral   . PMB (postmenopausal bleeding)   . Pre-diabetes   . Sleep apnea   . Thrombocytopenia ([place])   . TR (tricuspid regurgitation) 06/25/2015   Trace, noted on ECHO   . Tubular adenoma polyp of rectum    8/12, Dr Fuller Plan, 5 yr follow up   . Uterine fibroid 10/19/2017   noted on CT pelvis  . Vitamin D deficiency    Past Surgical History:  Procedure Laterality Date  . AUGMENTATION MAMMAPLASTY Bilateral   . BREAST ENHANCEMENT SURGERY    . BREAST REDUCTION SURGERY    . COLONOSCOPY  01/19/2016  . DILATION AND CURETTAGE OF UTERUS N/A 06/24/2015   Procedure:  DILATATION AND CURETTAGE;  Surgeon: Emily Filbert, MD;  Location: [facility];  Service: Gynecology;  Laterality: N/A;  . KNEE ARTHROSCOPY WITH SUBCHONDROPLASTY Right 05/03/2018   Procedure: RIGHT KNEE ARTHROSCOPY WITH DEBRIDEMENT, PARTIAL MEDIAL MENISCECTOMY SUBCHONDROPLASTY MEDIAL TIBIAL PLATEAU AND MEDIAL FEMORAL CONDYLE;  Surgeon: Mcarthur Rossetti, MD;  Location: [facility];  Service: Orthopedics;  Laterality: Right;  . REDUCTION MAMMAPLASTY Bilateral   . TOE SURGERY     removal of bone in each foot  . TUBAL LIGATION     Allergies  Allergen Reactions  . Penicillins Rash and Other (See Comments)    Has patient had a PCN reaction causing immediate rash, facial/tongue/throat swelling, SOB or lightheadedness with hypotension: Yes Has patient had a PCN reaction causing severe rash involving mucus membranes or skin necrosis: No Has patient had a PCN reaction that required hospitalization: No Has patient had a PCN reaction occurring within the last 10 years: No If all of the above answers are "NO", then may proceed with Cephalosporin use.   [name] Reclast [Zoledronic Acid] Nausea And Vomiting   Prior to Admission medications   Medication Sig Start Date End Date Taking? Authorizing Provider  amLODipine (NORVASC) 5 MG tablet Take 1 tablet (5 mg total) by mouth daily. 04/26/18  Yes McVey, Gelene Mink, PA-C  cetirizine (ZYRTEC) 10 MG tablet Take 10 mg by mouth daily.   Yes [provider]  Estradiol 10 MCG INST Place 10 mcg vaginally 2 (two) times a week. 07/19/18  Yes Wendie Agreste, MD  hydrOXYzine (ATARAX/VISTARIL) 50 MG tablet Take 0.5-1 tablets (25-50 mg total) by mouth at bedtime as needed (insomnia.). 07/18/18  Yes Wendie Agreste, MD  ibuprofen (ADVIL,MOTRIN) 800 MG tablet Take 1 tablet (800 mg total) by mouth every 8 (eight) hours as needed. 05/04/18  Yes Mcarthur Rossetti, MD  metFORMIN (GLUCOPHAGE) 500 MG tablet Take 1 tablet (500 mg total) by mouth daily with breakfast.  08/01/18  Yes Whitmire, Dawn W, FNP  telmisartan (MICARDIS) 80 MG tablet Take 1 tablet (80 mg total) by mouth daily. 04/26/18  Yes McVey, Gelene Mink, PA-C  traZODone (DESYREL) 100 MG tablet Take 1-3 tablets (100-300 mg total) by mouth at bedtime as needed for sleep. 06/04/18  Yes McVey, Gelene Mink, PA-C  valACYclovir (VALTREX) 500 MG tablet Take 1 tablet (500 mg total) by mouth daily. 04/26/18  Yes McVey, Gelene Mink, PA-C  Vitamin D, Ergocalciferol, (DRISDOL) 1.25 MG (50000 UT) CAPS capsule Take 1 capsule (50,000 Units total) by mouth every 7 (seven) days. 08/01/18  Yes Whitmire, Joneen Boers, FNP   Social History   Socioeconomic History  . Marital status:  Single    Spouse name: Not on file  . Number of children: Not on file  . Years of education: Not on file  . Highest education level: Not on file  Occupational History  . Occupation: Actuary- [employer]  Social Needs  . Financial resource strain: Not on file  . Food insecurity:    Worry: Not on file    Inability: Not on file  . Transportation needs:    Medical: Not on file    Non-medical: Not on file  Tobacco Use  . Smoking status: Former Smoker    Years: 7.00    Types: Cigars    Last attempt to quit: 08/11/2008    Years since quitting: 10.0  . Smokeless tobacco: Never Used  . Tobacco comment: smoked Black&Milds, 1 pack per day (5 in a pack)  Substance and Sexual Activity  . Alcohol use: Yes    Alcohol/week: 5.0 standard drinks    Types: 5 Cans of beer per week    Comment: occ   . Drug use: Never  . Sexual activity: Not Currently    Partners: Male    Birth control/protection: Surgical  Lifestyle  . Physical activity:    Days per week: Not on file    Minutes per session: Not on file  . Stress: Not on file  Relationships  . Social connections:    Talks on phone: Not on file    Gets together: Not on file    Attends religious service: Not on file    Active member of club or organization: Not on file     Attends meetings of clubs or organizations: Not on file    Relationship status: Not on file  . Intimate partner violence:    Fear of current or ex partner: Not on file    Emotionally abused: Not on file    Physically abused: Not on file    Forced sexual activity: Not on file  Other Topics Concern  . Not on file  Social History Narrative   Epworth sleepiness score as of 06/11/15 a 4   Exercise: yes, active at work, no formal exercise   Single, never married, in relationship off and on   Children: [place], [place] (both in [place]), 2 grandkids.  Raised 2 nieces [name], [address] (they have 5 kids)   Occupation: [employer]   Religion: faith important, not active in a church   Seatbelt use: yes     Review of Systems Per HPI    Objective:   Physical Exam Vitals signs reviewed.  Constitutional:      General: She is not in acute distress.    Appearance: She is well-developed.  HENT:     Head: Normocephalic and atraumatic.     Right Ear: Hearing, tympanic membrane, ear canal and external ear normal.     Left Ear: Hearing, tympanic membrane, ear canal and external ear normal.     Nose: Nose normal.     Mouth/Throat:     Pharynx: No oropharyngeal exudate.  Eyes:     Conjunctiva/sclera: Conjunctivae normal.     Pupils: Pupils are equal, round, and reactive to light.  Cardiovascular:     Rate and Rhythm: Normal rate and regular rhythm.     Heart sounds: Normal heart sounds. No murmur.  Pulmonary:     Effort: Pulmonary effort is normal. No respiratory distress.     Breath sounds: Normal breath sounds. No wheezing or rhonchi.  Musculoskeletal:  Right hand: She exhibits normal capillary refill. Normal sensation noted. Normal strength noted.       Hands:  Skin:    General: Skin is warm and dry.     Findings: No rash.  Neurological:     Mental Status: She is alert and oriented to person, place, and time.  Psychiatric:        Behavior: Behavior normal.      Vitals:   09/03/18 1350  BP: 133/74  Pulse: 88  Resp: 12  Temp: 98.3 F (36.8 C)  TempSrc: Oral  SpO2: 99%  Weight: 296 lb 3.2 oz (134.4 kg)  Height: 5\' 7"  (1.702 m)    Dg Finger Thumb Right  Result Date: 09/03/2018 CLINICAL DATA:  Right thumb pain after slammed in car door 3 days ago. EXAM: RIGHT THUMB 2+V COMPARISON:  None. FINDINGS: Mild degenerate change over the first carpometacarpal joint and first MCP joint. No evidence of acute fracture or dislocation. IMPRESSION: No acute findings. Electronically Signed   By: Marin Olp M.D.   On: 09/03/2018 14:55      Assessment & Plan:    STORMEY WILBORN is a 58 y.o. female Acute upper respiratory infection Cough - Plan: Promethazine-Codeine 6.25-10 MG/5ML SOLN  -Suspected viral URI/cough.  Reassuring exam.  Symptomatic care discussed.  Agreed to provide Phenergan with codeine cough syrup but side effects and risk discussed, specifically to avoid combination with benzodiazepines.  Understanding expressed, RTC precautions given.  Contusion of right thumb without damage to nail, initial encounter - Plan: DG Finger Thumb Right Subungual hematoma of digit of hand, initial encounter - Plan: DG Finger Thumb Right  -Contusion of thumb without apparent nail avulsion but proximal slight subungual hematoma.  Potential risk of ultimate avulsion/nail change discussed.  Does not appear to have indication for trephination at this time and beyond typical timing.  Very small tear of skin at the lateral nail fold but no sign of infection at this time.  Decided against antibiotics based on current appearance but RTC precautions discussed along with wound care.  Meds ordered this encounter  Medications  . Promethazine-Codeine 6.25-10 MG/5ML SOLN    Sig: Take 5 mLs by mouth at bedtime as needed.    Dispense:  180 mL    Refill:  0   Patient Instructions   See information below and upper respiratory infections.  Saline nasal spray as needed for  nasal congestion, Mucinex as needed for cough during the day, codeine cough syrup was written at night but do not combine that with benzodiazepines or other sedating medications.  There does appear to be a small subungual hematoma or bruising below the nail.  Based on that size I do not think any procedure is needed for that today.  See information below.  As you have good function and range of motion of the thumb I do not think a splint is needed right now, but be careful using that hand over the next 1 to 2 weeks.  Return to the clinic or go to the nearest emergency room if any of your symptoms worsen or new symptoms occur.  Thank you for coming in today.    Subungual Hematoma A subungual hematoma is a collection of blood under a fingernail or toenail. It can cause pain and a blue area under the nail. What are the causes? This condition is caused by an injury to a finger or toe that breaks a blood vessel under the nail. It can result from:  A hard, direct hit to a finger or toe (crush injury).  Pressure being put on a finger or toe over and over again, such as pressure on a toe from running. What are the signs or symptoms?   A blue or dark blue color under the nail.  Pain or throbbing in the injured area. How is this treated? Treatment is often not needed for this condition. The pain often goes away in a few days, and the dark color under the nail will go away as the nail grows. If treatment is needed, your doctor may:  Do a procedure to drain the blood from under the nail. This may be done if you have a lot of pain or if a lot of blood collects under the nail.  Remove the nail. This may be done if there is a cut under the nail that needs stitches (sutures). Follow these instructions at home: Managing pain, stiffness, and swelling   If told, put ice on the area. ? Put ice in a plastic bag. ? Place a towel between your skin and the bag. ? Leave the ice on for 20 minutes, 2-3  times a day.  Raise (elevate) the injured finger or toe above the level of your heart while you are sitting or lying down. Doing this will help with pain and swelling. Injury care  Follow instructions from your doctor about how to take care of your injury. Make sure you: ? Change any bandage (dressing) as told by your doctor. ? Wash your hands with soap and water before you change your bandage. If you cannot use soap and water, use hand sanitizer. ? Leave stitches (sutures) in place. You may have these if your doctor fixed a cut under the nail. The stitches may need to stay in place for 2 weeks or longer.  If part of your nail falls off, gently trim the rest of the nail. General instructions  Take over-the-counter and prescription medicines only as told by your doctor.  Return to your normal activities as told by your doctor. Ask your doctor what activities are safe for you.  Keep all follow-up visits as told by your doctor. This is important. Contact a doctor if you have:  Pain that is not helped by medicine.  A fever.  Redness, swelling, or pain around your nail. Get help right away if you have:  Fluid, blood, or pus coming from your nail. Summary  A subungual hematoma is a collection of blood under a fingernail or toenail.  It can cause pain and a blue area under the nail.  Treatment is often not needed for this condition.  Raise the injured finger or toe above the level of your heart while you are sitting or lying down. This information is not intended to replace advice given to you by your health care provider. Make sure you discuss any questions you have with your health care provider. Document Released: 09/19/2011 Document Revised: 11/30/2017 Document Reviewed: 11/30/2017 Elsevier Interactive Patient Education  2019 [employer].    Upper Respiratory Infection, Adult An upper respiratory infection (URI) affects the nose, throat, and upper air passages. URIs are  caused by germs (viruses). The most common type of URI is often called "the common cold." Medicines cannot cure URIs, but you can do things at home to relieve your symptoms. URIs usually get better within 7-10 days. Follow these instructions at home: Activity  Rest as needed.  If you have a fever, stay home from work  or school until your fever is gone, or until your doctor says you may return to work or school. ? You should stay home until you cannot spread the infection anymore (you are not contagious). ? Your doctor may have you wear a face mask so you have less risk of spreading the infection. Relieving symptoms  Gargle with a salt-water mixture 3-4 times a day or as needed. To make a salt-water mixture, completely dissolve -1 tsp of salt in 1 cup of warm water.  Use a cool-mist humidifier to add moisture to the air. This can help you breathe more easily. Eating and drinking   Drink enough fluid to keep your pee (urine) pale yellow.  Eat soups and other clear broths. General instructions   Take over-the-counter and prescription medicines only as told by your doctor. These include cold medicines, fever reducers, and cough suppressants.  Do not use any products that contain nicotine or tobacco. These include cigarettes and e-cigarettes. If you need help quitting, ask your doctor.  Avoid being where people are smoking (avoid secondhand smoke).  Make sure you get regular shots and get the flu shot every year.  Keep all follow-up visits as told by your doctor. This is important. How to avoid spreading infection to others   Wash your hands often with soap and water. If you do not have soap and water, use hand sanitizer.  Avoid touching your mouth, face, eyes, or nose.  Cough or sneeze into a tissue or your sleeve or elbow. Do not cough or sneeze into your hand or into the air. Contact a doctor if:  You are getting worse, not better.  You have any of these: ? A  fever. ? Chills. ? [place] or red mucus in your nose. ? Yellow or Velie fluid (discharge)coming from your nose. ? Pain in your face, especially when you bend forward. ? Swollen neck glands. ? Pain with swallowing. ? White areas in the back of your throat. Get help right away if:  You have shortness of breath that gets worse.  You have very bad or constant: ? Headache. ? Ear pain. ? Pain in your forehead, behind your eyes, and over your cheekbones (sinus pain). ? Chest pain.  You have long-lasting (chronic) lung disease along with any of these: ? Wheezing. ? Long-lasting cough. ? Coughing up blood. ? A change in your usual mucus.  You have a stiff neck.  You have changes in your: ? Vision. ? Hearing. ? Thinking. ? Mood. Summary  An upper respiratory infection (URI) is caused by a germ called a virus. The most common type of URI is often called "the common cold."  URIs usually get better within 7-10 days.  Take over-the-counter and prescription medicines only as told by your doctor. This information is not intended to replace advice given to you by your health care provider. Make sure you discuss any questions you have with your health care provider. Document Released: 12/14/2007 Document Revised: 02/17/2017 Document Reviewed: 02/17/2017 Elsevier Interactive Patient Education  [employer].   If you have lab work done today you will be contacted with your lab results within the next 2 weeks.  If you have not heard from [place] then please contact us. The fastest way to get your results is to register for My Chart.   IF you received an x-ray today, you will receive an invoice from [facility]. Please contact [facility] at [phone] with questions or concerns regarding your invoice.  IF you received labwork today, you will receive an invoice from [facility]. Please contact [facility] at [phone] with questions or concerns regarding your invoice.    Our billing staff will not be able to assist you with questions regarding bills from these companies.  You will be contacted with the lab results as soon as they are available. The fastest way to get your results is to activate your My Chart account. Instructions are located on the last page of this paperwork. If you have not heard from [place] regarding the results in 2 weeks, please contact this office.       Signed,   Merri Ray, MD [facility].  09/04/18 10:06 PM

## 2018-09-03 NOTE — Patient Instructions (Addendum)
See information below and upper respiratory infections.  Saline nasal spray as needed for nasal congestion, Mucinex as needed for cough during the day, codeine cough syrup was written at night but do not combine that with benzodiazepines or other sedating medications.  There does appear to be a small subungual hematoma or bruising below the nail.  Based on that size I do not think any procedure is needed for that today.  See information below.  As you have good function and range of motion of the thumb I do not think a splint is needed right now, but be careful using that hand over the next 1 to 2 weeks.  Return to the clinic or go to the nearest emergency room if any of your symptoms worsen or new symptoms occur.  Thank you for coming in today.    Subungual Hematoma A subungual hematoma is a collection of blood under a fingernail or toenail. It can cause pain and a blue area under the nail. What are the causes? This condition is caused by an injury to a finger or toe that breaks a blood vessel under the nail. It can result from:  A hard, direct hit to a finger or toe (crush injury).  Pressure being put on a finger or toe over and over again, such as pressure on a toe from running. What are the signs or symptoms?   A blue or dark blue color under the nail.  Pain or throbbing in the injured area. How is this treated? Treatment is often not needed for this condition. The pain often goes away in a few days, and the dark color under the nail will go away as the nail grows. If treatment is needed, your doctor may:  Do a procedure to drain the blood from under the nail. This may be done if you have a lot of pain or if a lot of blood collects under the nail.  Remove the nail. This may be done if there is a cut under the nail that needs stitches (sutures). Follow these instructions at home: Managing pain, stiffness, and swelling   If told, put ice on the area. ? Put ice in a plastic  bag. ? Place a towel between your skin and the bag. ? Leave the ice on for 20 minutes, 2-3 times a day.  Raise (elevate) the injured finger or toe above the level of your heart while you are sitting or lying down. Doing this will help with pain and swelling. Injury care  Follow instructions from your doctor about how to take care of your injury. Make sure you: ? Change any bandage (dressing) as told by your doctor. ? Wash your hands with soap and water before you change your bandage. If you cannot use soap and water, use hand sanitizer. ? Leave stitches (sutures) in place. You may have these if your doctor fixed a cut under the nail. The stitches may need to stay in place for 2 weeks or longer.  If part of your nail falls off, gently trim the rest of the nail. General instructions  Take over-the-counter and prescription medicines only as told by your doctor.  Return to your normal activities as told by your doctor. Ask your doctor what activities are safe for you.  Keep all follow-up visits as told by your doctor. This is important. Contact a doctor if you have:  Pain that is not helped by medicine.  A fever.  Redness, swelling, or pain around your  nail. Get help right away if you have:  Fluid, blood, or pus coming from your nail. Summary  A subungual hematoma is a collection of blood under a fingernail or toenail.  It can cause pain and a blue area under the nail.  Treatment is often not needed for this condition.  Raise the injured finger or toe above the level of your heart while you are sitting or lying down. This information is not intended to replace advice given to you by your health care provider. Make sure you discuss any questions you have with your health care provider. Document Released: 09/19/2011 Document Revised: 11/30/2017 Document Reviewed: 11/30/2017 Elsevier Interactive Patient Education  2019 [employer].    Upper Respiratory Infection, Adult An  upper respiratory infection (URI) affects the nose, throat, and upper air passages. URIs are caused by germs (viruses). The most common type of URI is often called "the common cold." Medicines cannot cure URIs, but you can do things at home to relieve your symptoms. URIs usually get better within 7-10 days. Follow these instructions at home: Activity  Rest as needed.  If you have a fever, stay home from work or school until your fever is gone, or until your doctor says you may return to work or school. ? You should stay home until you cannot spread the infection anymore (you are not contagious). ? Your doctor may have you wear a face mask so you have less risk of spreading the infection. Relieving symptoms  Gargle with a salt-water mixture 3-4 times a day or as needed. To make a salt-water mixture, completely dissolve -1 tsp of salt in 1 cup of warm water.  Use a cool-mist humidifier to add moisture to the air. This can help you breathe more easily. Eating and drinking   Drink enough fluid to keep your pee (urine) pale yellow.  Eat soups and other clear broths. General instructions   Take over-the-counter and prescription medicines only as told by your doctor. These include cold medicines, fever reducers, and cough suppressants.  Do not use any products that contain nicotine or tobacco. These include cigarettes and e-cigarettes. If you need help quitting, ask your doctor.  Avoid being where people are smoking (avoid secondhand smoke).  Make sure you get regular shots and get the flu shot every year.  Keep all follow-up visits as told by your doctor. This is important. How to avoid spreading infection to others   Wash your hands often with soap and water. If you do not have soap and water, use hand sanitizer.  Avoid touching your mouth, face, eyes, or nose.  Cough or sneeze into a tissue or your sleeve or elbow. Do not cough or sneeze into your hand or into the air. Contact a  doctor if:  You are getting worse, not better.  You have any of these: ? A fever. ? Chills. ? Schweer or red mucus in your nose. ? Yellow or Ruperto fluid (discharge)coming from your nose. ? Pain in your face, especially when you bend forward. ? Swollen neck glands. ? Pain with swallowing. ? White areas in the back of your throat. Get help right away if:  You have shortness of breath that gets worse.  You have very bad or constant: ? Headache. ? Ear pain. ? Pain in your forehead, behind your eyes, and over your cheekbones (sinus pain). ? Chest pain.  You have long-lasting (chronic) lung disease along with any of these: ? Wheezing. ? Long-lasting cough. ?  Coughing up blood. ? A change in your usual mucus.  You have a stiff neck.  You have changes in your: ? Vision. ? Hearing. ? Thinking. ? Mood. Summary  An upper respiratory infection (URI) is caused by a germ called a virus. The most common type of URI is often called "the common cold."  URIs usually get better within 7-10 days.  Take over-the-counter and prescription medicines only as told by your doctor. This information is not intended to replace advice given to you by your health care provider. Make sure you discuss any questions you have with your health care provider. Document Released: 12/14/2007 Document Revised: 02/17/2017 Document Reviewed: 02/17/2017 Elsevier Interactive Patient Education  [employer].   If you have lab work done today you will be contacted with your lab results within the next 2 weeks.  If you have not heard from [place] then please contact us. The fastest way to get your results is to register for My Chart.   IF you received an x-ray today, you will receive an invoice from [facility]. Please contact [facility] at [phone] with questions or concerns regarding your invoice.   IF you received labwork today, you will receive an invoice from [place]. Please contact  [facility] at [phone] with questions or concerns regarding your invoice.   Our billing staff will not be able to assist you with questions regarding bills from these companies.  You will be contacted with the lab results as soon as they are available. The fastest way to get your results is to activate your My Chart account. Instructions are located on the last page of this paperwork. If you have not heard from [place] regarding the results in 2 weeks, please contact this office.

## 2018-09-04 ENCOUNTER — Encounter (INDEPENDENT_AMBULATORY_CARE_PROVIDER_SITE_OTHER): Payer: Self-pay | Admitting: Family Medicine

## 2018-09-04 ENCOUNTER — Ambulatory Visit (INDEPENDENT_AMBULATORY_CARE_PROVIDER_SITE_OTHER): Payer: 59 | Admitting: Family Medicine

## 2018-09-04 ENCOUNTER — Encounter: Payer: Self-pay | Admitting: Family Medicine

## 2018-09-04 VITALS — BP 122/83 | HR 81 | Temp 98.1°F | Ht 67.0 in | Wt 291.0 lb

## 2018-09-04 DIAGNOSIS — E559 Vitamin D deficiency, unspecified: Secondary | ICD-10-CM

## 2018-09-04 DIAGNOSIS — R7303 Prediabetes: Secondary | ICD-10-CM

## 2018-09-04 DIAGNOSIS — Z9189 Other specified personal risk factors, not elsewhere classified: Secondary | ICD-10-CM

## 2018-09-04 DIAGNOSIS — Z6841 Body Mass Index (BMI) 40.0 and over, adult: Secondary | ICD-10-CM

## 2018-09-04 MED ORDER — VITAMIN D (ERGOCALCIFEROL) 1.25 MG (50000 UNIT) PO CAPS: 50000.0000 [IU] | ORAL_CAPSULE | ORAL | 0 refills | Status: DC

## 2018-09-04 MED ORDER — METFORMIN HCL 500 MG PO TABS: 500.0000 mg | ORAL_TABLET | Freq: Every day | ORAL | 0 refills | Status: DC

## 2018-09-04 MED FILL — metFORMIN HCL 500 MG TABS: 500 | 30 days supply | Qty: 30 | Fill #0

## 2018-09-04 MED FILL — VIT D2 1.25 MG (50,000 UNIT: 1.25 MG | 28 days supply | Qty: 4 | Fill #0

## 2018-09-04 NOTE — Progress Notes (Signed)
Office: [phone]  /  Fax: [phone]   HPI:   Chief Complaint: OBESITY Michelle Huerta is here to discuss her progress with her obesity treatment plan. She is doing portion control and is following her eating plan approximately 30% of the time. She states she is exercising 0 minutes 0 times per week. Michelle Huerta is surprised she lost weight. She reports having increased stress over financial issues since being out of work since October due to a knee injury. She is planning on going back to work this week. She does report not eating all the food on her plan. Her weight is 291 lb (132 kg) today and has had a weight loss of 2 pounds over a period of 3 weeks since her last visit. She has lost 5 lbs since starting treatment with [place].  Pre-Diabetes Michelle Huerta has a diagnosis of prediabetes based on her elevated Hgb A1c of 6.3 on 05/30/2018 and was informed this puts her at greater risk of developing diabetes. She is taking metformin currently and continues to work on diet and exercise to decrease risk of diabetes. She denies hypoglycemia.  At risk for diabetes Michelle Huerta is at higher than average risk for developing diabetes due to her obesity. She currently denies polyuria or polydipsia.  Vitamin D deficiency Michelle Huerta has a diagnosis of Vitamin D deficiency. Her last Vitamin D level was reported at 19.7 on 05/30/2018, which is not at goal. She is currently taking prescription Vit D and denies nausea, vomiting or muscle weakness.  ASSESSMENT AND PLAN:  Prediabetes - Plan: metFORMIN (GLUCOPHAGE) 500 MG tablet  Vitamin D deficiency - Plan: Vitamin D, Ergocalciferol, (DRISDOL) 1.25 MG (50000 UT) CAPS capsule  At risk for diabetes mellitus  Class 3 severe obesity with serious comorbidity and body mass index (BMI) of 45.0 to 49.9 in adult, unspecified obesity type [facility])  PLAN:  Pre-Diabetes Michelle Huerta will continue to work on weight loss, exercise, and decreasing simple carbohydrates in her diet to help decrease the risk of  diabetes. Michelle Huerta is on metformin for now and a prescription was written today for 500 mg qam #30 with no refills. Michelle Huerta agrees to follow-up with our clinic in 4 weeks.   Diabetes risk counseling Sami was given extended (15 minutes) diabetes prevention counseling today. She is 58 y.o. female and has risk factors for diabetes including obesity. We discussed intensive lifestyle modifications today with an emphasis on weight loss as well as increasing exercise and decreasing simple carbohydrates in her diet.  Vitamin D Deficiency Michelle Huerta was informed that low Vitamin D levels contributes to fatigue and are associated with obesity, breast, and colon cancer. She agrees to continue to take prescription Vit D @ 50,000 IU every week #4 with no refills and will follow-up for routine testing of Vitamin D at her next visit. She was informed of the risk of over-replacement of Vitamin D and agrees to not increase her dose unless she discusses this with [place] first. Michelle Huerta agrees to follow-up with our clinic in 4 weeks.  Obesity Michelle Huerta is currently in the action stage of change. As such, her goal is to continue with weight loss efforts. She has agreed to follow the Category 3 plan with added lunch options. We discussed the following Behavioral Modification Strategies today: increasing lean protein intake, increasing vegetables, work on meal planning and easy cooking plans, and planning for success. We discussed using her return to work as an opportunity to do more meal planning and stick closer to plan.  has not been  prescribed exercise at this time.  [name] has agreed to follow-up with our clinic in 4 weeks. She was informed of the importance of frequent follow up visits to maximize her success with intensive lifestyle modifications for her multiple health conditions.  ALLERGIES: Allergies  Allergen Reactions  . Penicillins Rash and Other (See Comments)    Has patient had a PCN reaction causing immediate rash,  facial/tongue/throat swelling, SOB or lightheadedness with hypotension: Yes Has patient had a PCN reaction causing severe rash involving mucus membranes or skin necrosis: No Has patient had a PCN reaction that required hospitalization: No Has patient had a PCN reaction occurring within the last 10 years: No If all of the above answers are "NO", then may proceed with Cephalosporin use.   Michelle Huerta Reclast [Zoledronic Acid] Nausea And Vomiting    MEDICATIONS: Current Outpatient Medications on File Prior to Visit  Medication Sig Dispense Refill  . amLODipine (NORVASC) 5 MG tablet Take 1 tablet (5 mg total) by mouth daily. 90 tablet 3  . cetirizine (ZYRTEC) 10 MG tablet Take 10 mg by mouth daily.    . Estradiol 10 MCG INST Place 10 mcg vaginally 2 (two) times a week. 12 each 1  . hydrOXYzine (ATARAX/VISTARIL) 50 MG tablet Take 0.5-1 tablets (25-50 mg total) by mouth at bedtime as needed (insomnia.). 60 tablet 1  . ibuprofen (ADVIL,MOTRIN) 800 MG tablet Take 1 tablet (800 mg total) by mouth every 8 (eight) hours as needed. 90 tablet 0  . Promethazine-Codeine 6.25-10 MG/5ML SOLN Take 5 mLs by mouth at bedtime as needed. 180 mL 0  . telmisartan (MICARDIS) 80 MG tablet Take 1 tablet (80 mg total) by mouth daily. 90 tablet 3  . traZODone (DESYREL) 100 MG tablet Take 1-3 tablets (100-300 mg total) by mouth at bedtime as needed for sleep. 90 tablet 1  . valACYclovir (VALTREX) 500 MG tablet Take 1 tablet (500 mg total) by mouth daily. 90 tablet 3   No current facility-administered medications on file prior to visit.     PAST MEDICAL HISTORY: Past Medical History:  Diagnosis Date  . Allergy   . Arthritis of both knees   . Bilateral chronic knee pain   . Chronic back pain   . Constipation   . DDD (degenerative disc disease), cervical   . Depression    no meds  . Genital herpes    HSV type 2 at rectum, culture positive 3/15  . Glaucoma   . Grade I diastolic dysfunction 42/87/6811   noted on ECHO     . Heart murmur   . History of colon polyps   . HLD (hyperlipidemia)   . Hypertension   . Hypothyroidism   . Insomnia   . Internal hemorrhoids   . Lactose intolerance   . Lupus ([place])   . LVH (left ventricular hypertrophy) 06/25/2015   Mild, noted on ECHO   . Mild sleep apnea    1/17 CPAP Dr Claiborne Billings does not use cpap   . Morbid obesity ([place]) 06/11/2015  . Osteoporosis   . Paget's disease   . Paget's disease of bone    Dr Trudie Reed, Reclast insufion 02/14/10 - bad reaction to infusion, now tolerates fosamax weekly  . Plantar fasciitis, bilateral   . PMB (postmenopausal bleeding)   . Pre-diabetes   . Sleep apnea   . Thrombocytopenia ([place])   . TR (tricuspid regurgitation) 06/25/2015   Trace, noted on ECHO   . Tubular adenoma polyp of rectum    8/12, Dr  Fuller Plan, 5 yr follow up   . Uterine fibroid 10/19/2017   noted on CT pelvis  . Vitamin D deficiency     PAST SURGICAL HISTORY: Past Surgical History:  Procedure Laterality Date  . AUGMENTATION MAMMAPLASTY Bilateral   . BREAST ENHANCEMENT SURGERY    . BREAST REDUCTION SURGERY    . COLONOSCOPY  01/19/2016  . DILATION AND CURETTAGE OF UTERUS N/A 06/24/2015   Procedure: DILATATION AND CURETTAGE;  Surgeon: Emily Filbert, MD;  Location: [facility];  Service: Gynecology;  Laterality: N/A;  . KNEE ARTHROSCOPY WITH SUBCHONDROPLASTY Right 05/03/2018   Procedure: RIGHT KNEE ARTHROSCOPY WITH DEBRIDEMENT, PARTIAL MEDIAL MENISCECTOMY SUBCHONDROPLASTY MEDIAL TIBIAL PLATEAU AND MEDIAL FEMORAL CONDYLE;  Surgeon: Mcarthur Rossetti, MD;  Location: [facility];  Service: Orthopedics;  Laterality: Right;  . REDUCTION MAMMAPLASTY Bilateral   . TOE SURGERY     removal of bone in each foot  . TUBAL LIGATION      SOCIAL HISTORY: Social History   Tobacco Use  . Smoking status: Former Smoker    Years: 7.00    Types: Cigars    Last attempt to quit: 08/11/2008    Years since quitting: 10.0  . Smokeless tobacco: Never Used  . Tobacco comment: smoked  [employer], 1 pack per day (5 in a pack)  Substance Use Topics  . Alcohol use: Yes    Alcohol/week: 5.0 standard drinks    Types: 5 Cans of beer per week    Comment: occ   . Drug use: Never    FAMILY HISTORY: Family History  Problem Relation Age of Onset  . Hypertension Mother   . Heart disease Mother   . Hypertension Father   . Hypertension Sister   . Cancer Maternal Grandmother   . Stomach cancer Paternal Grandmother   . Hypertension Son   . Hypertension Son   . Colon cancer Neg Hx    ROS: Review of Systems  Constitutional: Positive for weight loss.  Gastrointestinal: Negative for nausea and vomiting.  Musculoskeletal:       Negative for muscle weakness.  Endo/Heme/Allergies:       Negative for hypoglycemia.   PHYSICAL EXAM: Blood pressure 122/83, pulse 81, temperature 98.1 F (36.7 C), temperature source Oral, height 5\' 7"  (1.702 m), weight 291 lb (132 kg), last menstrual period 08/01/2012, SpO2 99 %. Body mass index is 45.58 kg/m. Physical Exam Vitals signs reviewed.  Constitutional:      Appearance: Normal appearance. She is obese.  Cardiovascular:     Rate and Rhythm: Normal rate.     Pulses: Normal pulses.  Pulmonary:     Effort: Pulmonary effort is normal.     Breath sounds: Normal breath sounds.  Musculoskeletal: Normal range of motion.  Skin:    General: Skin is warm and dry.  Neurological:     Mental Status: She is alert and oriented to person, place, and time.  Psychiatric:        Behavior: Behavior normal.   RECENT LABS AND TESTS: BMET    Component Value Date/Time   NA 139 05/30/2018 1116   K 4.2 05/30/2018 1116   CL 101 05/30/2018 1116   CO2 23 05/30/2018 1116   GLUCOSE 86 05/30/2018 1116   GLUCOSE 104 (H) 04/26/2018 0837   BUN 13 05/30/2018 1116   CREATININE 0.73 05/30/2018 1116   CALCIUM 9.2 05/30/2018 1116   GFRNONAA 93 05/30/2018 1116   GFRAA 107 05/30/2018 1116   Lab Results  Component Value Date  HGBA1C 6.3 (H) 05/30/2018     HGBA1C 6.0 (A) 03/09/2018   Lab Results  Component Value Date   INSULIN 41.4 (H) 05/30/2018   CBC    Component Value Date/Time   WBC 6.4 05/30/2018 1116   WBC 6.0 04/26/2018 0837   RBC 4.57 05/30/2018 1116   RBC 4.71 04/26/2018 0837   HGB 11.6 05/30/2018 1116   HCT 37.0 05/30/2018 1116   PLT 173 05/30/2018 1116   MCV 81 05/30/2018 1116   MCH 25.4 (L) 05/30/2018 1116   MCH 25.9 (L) 04/26/2018 0837   MCHC 31.4 (L) 05/30/2018 1116   MCHC 29.8 (L) 04/26/2018 0837   RDW 13.7 05/30/2018 1116   LYMPHSABS 2.4 05/30/2018 1116   MONOABS 0.4 06/19/2012 0936   EOSABS 0.1 05/30/2018 1116   BASOSABS 0.0 05/30/2018 1116   Iron/TIBC/Ferritin/ %Sat No results found for: IRON, TIBC, FERRITIN, IRONPCTSAT Lipid Panel     Component Value Date/Time   CHOL 134 05/30/2018 1116   TRIG 85 05/30/2018 1116   HDL 75 05/30/2018 1116   CHOLHDL 1.8 05/30/2018 1116   LDLCALC 42 05/30/2018 1116   Hepatic Function Panel     Component Value Date/Time   PROT 7.8 05/30/2018 1116   ALBUMIN 3.9 05/30/2018 1116   AST 29 05/30/2018 1116   ALT 40 (H) 05/30/2018 1116   ALKPHOS 91 05/30/2018 1116   BILITOT 0.5 05/30/2018 1116      Component Value Date/Time   TSH 1.280 05/30/2018 1116   TSH 1.220 04/26/2018 1647   TSH 1.738 04/06/2015 1520    Ref. Range 05/30/2018 11:16  Vitamin D, 25-Hydroxy Latest Ref Range: 30.0 - 100.0 ng/mL 19.7 (L)    OBESITY BEHAVIORAL INTERVENTION VISIT  Today's visit was #7  Starting weight: 296 lbs Starting date: 05/30/2018 Today's weight: 291 lbs  Today's date: 09/04/2018 Total lbs lost to date: 5    09/04/2018  Height 5\' 7"  (1.702 m)  Weight 291 lb (132 kg)  BMI (Calculated) 45.57  BLOOD PRESSURE - SYSTOLIC 517  BLOOD PRESSURE - DIASTOLIC 83   Body Fat % 00.1 %  Total Body Water (lbs) 90.4 lbs   ASK: We discussed the diagnosis of obesity with [name] today and Jernie agreed to give [place] permission to discuss obesity behavioral modification therapy  today.  ASSESS: Arantza has the diagnosis of obesity and her BMI today is 45.57. Correen is in the action stage of change.   ADVISE: Orly was educated on the multiple health risks of obesity as well as the benefit of weight loss to improve her health. She was advised of the need for long term treatment and the importance of lifestyle modifications to improve her current health and to decrease her risk of future health problems.  AGREE: Multiple dietary modification options and treatment options were discussed and  Eline agreed to follow the recommendations documented in the above note.  ARRANGE: Anavey was educated on the importance of frequent visits to treat obesity as outlined per CMS and USPSTF guidelines and agreed to schedule her next follow up appointment today.  IMichaelene Song, am acting as Location manager for [employer], FNP-C.  I have reviewed the above documentation for accuracy and completeness, and I agree with the above.  - Baeleigh Devincent, FNP-C.

## 2018-09-05 ENCOUNTER — Encounter (INDEPENDENT_AMBULATORY_CARE_PROVIDER_SITE_OTHER): Payer: Self-pay | Admitting: Family Medicine

## 2018-09-10 ENCOUNTER — Encounter: Payer: Self-pay | Admitting: Family Medicine

## 2018-09-10 ENCOUNTER — Other Ambulatory Visit: Payer: Self-pay

## 2018-09-10 ENCOUNTER — Ambulatory Visit: Payer: 59 | Admitting: Family Medicine

## 2018-09-10 ENCOUNTER — Encounter

## 2018-09-10 VITALS — BP 126/82 | HR 78 | Temp 98.0°F | Resp 13 | Ht 67.0 in | Wt 297.0 lb

## 2018-09-10 DIAGNOSIS — M170 Bilateral primary osteoarthritis of knee: Secondary | ICD-10-CM

## 2018-09-10 DIAGNOSIS — G4721 Circadian rhythm sleep disorder, delayed sleep phase type: Secondary | ICD-10-CM | POA: Diagnosis not present

## 2018-09-10 DIAGNOSIS — G4709 Other insomnia: Secondary | ICD-10-CM | POA: Diagnosis not present

## 2018-09-10 DIAGNOSIS — E559 Vitamin D deficiency, unspecified: Secondary | ICD-10-CM | POA: Diagnosis not present

## 2018-09-10 DIAGNOSIS — G4733 Obstructive sleep apnea (adult) (pediatric): Secondary | ICD-10-CM | POA: Diagnosis not present

## 2018-09-10 DIAGNOSIS — G4700 Insomnia, unspecified: Secondary | ICD-10-CM | POA: Diagnosis not present

## 2018-09-10 DIAGNOSIS — I10 Essential (primary) hypertension: Secondary | ICD-10-CM

## 2018-09-10 DIAGNOSIS — R7303 Prediabetes: Secondary | ICD-10-CM | POA: Diagnosis not present

## 2018-09-10 LAB — POCT GLYCOSYLATED HEMOGLOBIN (HGB A1C): Hemoglobin A1C: 5.9 % — AB (ref 4.0–5.6)

## 2018-09-10 NOTE — Patient Instructions (Signed)
° ° ° °  If you have lab work done today you will be contacted with your lab results within the next 2 weeks.  If you have not heard from us then please contact us. The fastest way to get your results is to register for My Chart. ° ° °IF you received an x-ray today, you will receive an invoice from [facility]. Please contact [facility] at [phone] with questions or concerns regarding your invoice.  ° °IF you received labwork today, you will receive an invoice from [facility]. Please contact [facility] at [phone] with questions or concerns regarding your invoice.  ° °Our billing staff will not be able to assist you with questions regarding bills from these companies. ° °You will be contacted with the lab results as soon as they are available. The fastest way to get your results is to activate your My Chart account. Instructions are located on the last page of this paperwork. If you have not heard from us regarding the results in 2 weeks, please contact this office. °  ° ° ° °

## 2018-09-10 NOTE — Progress Notes (Signed)
Established Patient Office Visit  Subjective:  Patient ID: Michelle Huerta, female    DOB: January 26, 1961  Age: 58 y.o. MRN: [id number]  CC:  Chief Complaint  Patient presents with  . Prediabetes    pt need to re-establish care with new PCP to manage care     HPI [name] presents for   Prediabetes and morbid obesity-this is a patient with prediabetes who is currently on Metformin 500 mg that she takes once a day. She has a history of sleep apnea and her last A1c was 6.3% in November 2019. She has some chronic arthritis so she has not been able to exercise much as she would like and she is with orthopedics She is working on weight loss her last weight was 291 and today's weight is 297.  She is working on cutting back on snacks.  Body mass index is 46.52 kg/m.  Wt Readings from Last 3 Encounters:  09/10/18 297 lb (134.7 kg)  09/04/18 291 lb (132 kg)  09/03/18 296 lb 3.2 oz (134.4 kg)    Lab Results  Component Value Date   HGBA1C 6.3 (H) 05/30/2018   Insomnia Patient was diagnosed with obstructive sleep apnea for about 3 years 93 for 3 to 4 years and has been using CPAP but she also has delayed onset of sleep and usually does not fall asleep till about 10 PM She takes melatonin to help with sleep at night She goes to the sleep clinic.  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 She states that she is s/p multiple knee interventions including arthroscopic surgery and is now at hte point of needing knee replacement but has to lose weight first Lab Results  Component Value Date   CREATININE 0.73 05/30/2018     Essential hypertension  Patient has a history of hypertension and she is taking amlodipine 5 mg with telmisartan 80 mg a day.  To avoid salty foods avoid now that she does not really avoid salty foods and is not able to exercise much as she would like.   Any chest pains or palpitations she denies chest pains or palpitations any swelling in your legs she denies swelling in her lower extremities She states that she just took her bp medication in the lobby  BP Readings from Last 3 Encounters:  09/10/18 (!) 147/85  09/04/18 122/83  09/03/18 133/74    Past Medical History:  Diagnosis Date  . Allergy   . Arthritis of both knees   . Bilateral chronic knee pain   . Chronic back pain   . Constipation   . DDD (degenerative disc disease), cervical   . Depression    no meds  . Genital herpes    HSV type 2 at rectum, culture positive 3/15  . Glaucoma   . Grade I diastolic dysfunction 24/46/2863   noted on ECHO   . Heart murmur   . History of colon polyps   . HLD (hyperlipidemia)   . Hypertension   . Hypothyroidism   . Insomnia   . Internal hemorrhoids   . Lactose intolerance   . Lupus ([place])   . LVH (left ventricular hypertrophy) 06/25/2015   Mild, noted on ECHO   . Mild sleep apnea    1/17 CPAP Dr Claiborne Billings does not use cpap   . Morbid obesity ([place]) 06/11/2015  .  Osteoporosis   . Paget's disease   . Paget's disease of bone    Dr Trudie Reed, Reclast insufion 02/14/10 - bad reaction to infusion, now tolerates fosamax weekly  . Plantar fasciitis, bilateral   . PMB (postmenopausal bleeding)   . Pre-diabetes   . Sleep apnea   . Thrombocytopenia ([place])   . TR (tricuspid regurgitation) 06/25/2015   Trace, noted on ECHO   . Tubular adenoma polyp of rectum    8/12, Dr Fuller Plan, 5 yr follow up   . Uterine fibroid 10/19/2017   noted on CT pelvis  . Vitamin D deficiency     Past Surgical History:  Procedure Laterality Date  . AUGMENTATION MAMMAPLASTY Bilateral   . BREAST ENHANCEMENT SURGERY    . BREAST REDUCTION SURGERY    . COLONOSCOPY  01/19/2016  . DILATION AND CURETTAGE OF UTERUS N/A 06/24/2015   Procedure: DILATATION AND CURETTAGE;  Surgeon: Emily Filbert, MD;  Location: [facility];  Service: Gynecology;  Laterality: N/A;  . KNEE ARTHROSCOPY  WITH SUBCHONDROPLASTY Right 05/03/2018   Procedure: RIGHT KNEE ARTHROSCOPY WITH DEBRIDEMENT, PARTIAL MEDIAL MENISCECTOMY SUBCHONDROPLASTY MEDIAL TIBIAL PLATEAU AND MEDIAL FEMORAL CONDYLE;  Surgeon: Mcarthur Rossetti, MD;  Location: [facility];  Service: Orthopedics;  Laterality: Right;  . REDUCTION MAMMAPLASTY Bilateral   . TOE SURGERY     removal of bone in each foot  . TUBAL LIGATION      Family History  Problem Relation Age of Onset  . Hypertension Mother   . Heart disease Mother   . Hypertension Father   . Hypertension Sister   . Cancer Maternal Grandmother   . Stomach cancer Paternal Grandmother   . Hypertension Son   . Hypertension Son   . Colon cancer Neg Hx     Social History   Socioeconomic History  . Marital status: Single    Spouse name: Not on file  . Number of children: Not on file  . Years of education: Not on file  . Highest education level: Not on file  Occupational History  . Occupation: Actuary- [employer]  Social Needs  . Financial resource strain: Not on file  . Food insecurity:    Worry: Not on file    Inability: Not on file  . Transportation needs:    Medical: Not on file    Non-medical: Not on file  Tobacco Use  . Smoking status: Former Smoker    Years: 7.00    Types: Cigars    Last attempt to quit: 08/11/2008    Years since quitting: 10.0  . Smokeless tobacco: Never Used  . Tobacco comment: smoked Black&Milds, 1 pack per day (5 in a pack)  Substance and Sexual Activity  . Alcohol use: Yes    Alcohol/week: 5.0 standard drinks    Types: 5 Cans of beer per week    Comment: occ   . Drug use: Never  . Sexual activity: Not Currently    Partners: Male    Birth control/protection: Surgical  Lifestyle  . Physical activity:    Days per week: Not on file    Minutes per session: Not on file  . Stress: Not on file  Relationships  . Social connections:    Talks on phone: Not on file    Gets together: Not on file    Attends religious service:  Not on file    Active member of club or organization: Not on file    Attends meetings of clubs or organizations: Not on file  Relationship status: Not on file  . Intimate partner violence:    Fear of current or ex partner: Not on file    Emotionally abused: Not on file    Physically abused: Not on file    Forced sexual activity: Not on file  Other Topics Concern  . Not on file  Social History Narrative   Epworth sleepiness score as of 06/11/15 a 4   Exercise: yes, active at work, no formal exercise   Single, never married, in relationship off and on   Children: [place], [place] (both in [place]), 2 grandkids.  Raised 2 nieces [name], [address] (they have 5 kids)   Occupation: [employer]   Religion: faith important, not active in a church   Seatbelt use: yes    Outpatient Medications Prior to Visit  Medication Sig Dispense Refill  . amLODipine (NORVASC) 5 MG tablet Take 1 tablet (5 mg total) by mouth daily. 90 tablet 3  . cetirizine (ZYRTEC) 10 MG tablet Take 10 mg by mouth daily.    . Estradiol 10 MCG INST Place 10 mcg vaginally 2 (two) times a week. 12 each 1  . hydrOXYzine (ATARAX/VISTARIL) 50 MG tablet Take 0.5-1 tablets (25-50 mg total) by mouth at bedtime as needed (insomnia.). 60 tablet 1  . ibuprofen (ADVIL,MOTRIN) 800 MG tablet Take 1 tablet (800 mg total) by mouth every 8 (eight) hours as needed. 90 tablet 0  . metFORMIN (GLUCOPHAGE) 500 MG tablet Take 1 tablet (500 mg total) by mouth daily with breakfast. 30 tablet 0  . telmisartan (MICARDIS) 80 MG tablet Take 1 tablet (80 mg total) by mouth daily. 90 tablet 3  . traZODone (DESYREL) 100 MG tablet Take 1-3 tablets (100-300 mg total) by mouth at bedtime as needed for sleep. 90 tablet 1  . valACYclovir (VALTREX) 500 MG tablet Take 1 tablet (500 mg total) by mouth daily. 90 tablet 3  . Vitamin D, Ergocalciferol, (DRISDOL) 1.25 MG (50000 UT) CAPS capsule Take 1 capsule (50,000 Units total) by mouth every 7  (seven) days. 4 capsule 0  . Promethazine-Codeine 6.25-10 MG/5ML SOLN Take 5 mLs by mouth at bedtime as needed. 180 mL 0   No facility-administered medications prior to visit.     Allergies  Allergen Reactions  . Penicillins Rash and Other (See Comments)    Has patient had a PCN reaction causing immediate rash, facial/tongue/throat swelling, SOB or lightheadedness with hypotension: Yes Has patient had a PCN reaction causing severe rash involving mucus membranes or skin necrosis: No Has patient had a PCN reaction that required hospitalization: No Has patient had a PCN reaction occurring within the last 10 years: No If all of the above answers are "NO", then may proceed with Cephalosporin use.   [name] Reclast [Zoledronic Acid] Nausea And Vomiting    ROS Review of Systems    Objective:    Physical Exam  BP (!) 147/85   Pulse 78   Temp 98 F (36.7 C) (Oral)   Resp 13   Ht '5\' 7"'  (1.702 m)   Wt 297 lb (134.7 kg)   LMP 08/01/2012   SpO2 95%   BMI 46.52 kg/m  Wt Readings from Last 3 Encounters:  09/10/18 297 lb (134.7 kg)  09/04/18 291 lb (132 kg)  09/03/18 296 lb 3.2 oz (134.4 kg)   Physical Exam  Constitutional: Oriented to person, place, and time. Appears well-developed and well-nourished.  HENT:  Head: Normocephalic and atraumatic.  Eyes: Conjunctivae and EOM are normal.  Cardiovascular: Normal rate, regular rhythm, normal heart sounds and intact distal pulses.  No murmur heard. Pulmonary/Chest: Effort normal and breath sounds normal. No stridor. No respiratory distress. Has no wheezes.  Neurological: Is alert and oriented to person, place, and time.  Skin: Skin is warm. Capillary refill takes less than 2 seconds.  Psychiatric: Has a normal mood and affect. Behavior is normal. Judgment and thought content normal.   Knee exam  Bilateral knee enlargement and stiffness with ROM Left knee with crepitus  No effusion  No tenderness  There are no preventive care reminders  to display for this patient.  There are no preventive care reminders to display for this patient.  Lab Results  Component Value Date   TSH 1.280 05/30/2018   Lab Results  Component Value Date   WBC 6.4 05/30/2018   HGB 11.6 05/30/2018   HCT 37.0 05/30/2018   MCV 81 05/30/2018   PLT 173 05/30/2018   Lab Results  Component Value Date   NA 139 05/30/2018   K 4.2 05/30/2018   CO2 23 05/30/2018   GLUCOSE 86 05/30/2018   BUN 13 05/30/2018   CREATININE 0.73 05/30/2018   BILITOT 0.5 05/30/2018   ALKPHOS 91 05/30/2018   AST 29 05/30/2018   ALT 40 (H) 05/30/2018   PROT 7.8 05/30/2018   ALBUMIN 3.9 05/30/2018   CALCIUM 9.2 05/30/2018   ANIONGAP 6 04/26/2018   Lab Results  Component Value Date   CHOL 134 05/30/2018   Lab Results  Component Value Date   HDL 75 05/30/2018   Lab Results  Component Value Date   LDLCALC 42 05/30/2018   Lab Results  Component Value Date   TRIG 85 05/30/2018   Lab Results  Component Value Date   CHOLHDL 1.8 05/30/2018   Lab Results  Component Value Date   HGBA1C 6.3 (H) 05/30/2018      Assessment & Plan:   Problem List Items Addressed This Visit      Other   Morbid obesity ([place])  -  deteriorated   Prediabetes - Primary   Relevant Orders   POCT glycosylated hemoglobin (Hb A1C)   CMP14+EGFR   Vitamin D deficiency -    Patient is taking weekly vitamin D We will assess   Relevant Orders   VITAMIN D 25 Hydroxy (Vit-D Deficiency, Fractures)    Other Visit Diagnoses    Essential hypertension    -advised patient to adhere to DASH diet continue upper body exercises at least get some cardiovascular exercise to lower her blood pressure Continue blood pressure medications   Relevant Orders   CMP14+EGFR   Primary osteoarthritis of both knees       Other insomnia   -patient has issues with falling asleep and is on melatonin and seeing sleep clinic but she also has issues when she sleeps with apnea so she is on CPAP       No orders  of the defined types were placed in this encounter.   Follow-up: No follow-ups on file.    Forrest Moron, MD

## 2018-09-11 LAB — CMP14+EGFR
ALK PHOS: 91 IU/L (ref 39–117)
ALT: 54 IU/L — ABNORMAL HIGH (ref 0–32)
AST: 45 IU/L — ABNORMAL HIGH (ref 0–40)
Albumin/Globulin Ratio: 1.2 (ref 1.2–2.2)
Albumin: 3.9 g/dL (ref 3.8–4.9)
BUN/Creatinine Ratio: 16 (ref 9–23)
BUN: 14 mg/dL (ref 6–24)
Bilirubin Total: 0.4 mg/dL (ref 0.0–1.2)
CO2: 22 mmol/L (ref 20–29)
Calcium: 9.3 mg/dL (ref 8.7–10.2)
Chloride: 105 mmol/L (ref 96–106)
Creatinine, Ser: 0.85 mg/dL (ref 0.57–1.00)
GFR calc Af Amer: 89 mL/min/{1.73_m2} (ref >59)
GFR calc non Af Amer: 77 mL/min/{1.73_m2} (ref >59)
GLUCOSE: 103 mg/dL — AB (ref 65–99)
Globulin, Total: 3.3 g/dL (ref 1.5–4.5)
Potassium: 3.9 mmol/L (ref 3.5–5.2)
Sodium: 141 mmol/L (ref 134–144)
Total Protein: 7.2 g/dL (ref 6.0–8.5)

## 2018-09-11 LAB — VITAMIN D 25 HYDROXY (VIT D DEFICIENCY, FRACTURES): VIT D 25 HYDROXY: 42.3 ng/mL (ref 30.0–100.0)

## 2018-09-14 MED FILL — IBUPROFEN 800 MG TABS: 800 | 7 days supply | Qty: 20 | Fill #0

## 2018-09-14 MED FILL — miSOPROStol 200 MCG TABS: 200 | 1 days supply | Qty: 1 | Fill #0

## 2018-09-17 ENCOUNTER — Ambulatory Visit (INDEPENDENT_AMBULATORY_CARE_PROVIDER_SITE_OTHER): Payer: 59 | Admitting: Family Medicine

## 2018-09-17 ENCOUNTER — Encounter (INDEPENDENT_AMBULATORY_CARE_PROVIDER_SITE_OTHER): Payer: Self-pay

## 2018-09-17 ENCOUNTER — Encounter (INDEPENDENT_AMBULATORY_CARE_PROVIDER_SITE_OTHER): Payer: Self-pay | Admitting: Family Medicine

## 2018-09-17 DIAGNOSIS — M1711 Unilateral primary osteoarthritis, right knee: Secondary | ICD-10-CM

## 2018-09-17 MED FILL — TEMAZEPAM 30 MG CAPSULE: 30 | 30 days supply | Qty: 30 | Fill #0 | Status: TO

## 2018-09-17 NOTE — Progress Notes (Signed)
Office Visit Note   Patient: Michelle Huerta           Date of Birth: 07-13-1960           MRN: [id number] Visit Date: 09/17/2018 Requested by: Michelle Moron, MD [place], [place] [address] PCP: Michelle Moron, MD  Subjective: Chief Complaint  Patient presents with  . Right Knee - Pain    Sharp pains in the knee. Gives out with walking. Hurts the longer she must stand.  Went back to work 09/06/2018 - had to leave early yesterday and today due to pain.    HPI: She is here with persistent right knee pain.  She had arthroscopic debridement in October.  Afterwards she continued to have pain and was given Visco supplementation.  Recently she had a cortisone injection.  Pain relief is only short-lived.  Pain is primarily on the medial aspect.  She is having difficulty working because of her pain.  She needs a knee replacement but her BMI is 46.  Her target weight is 255 pounds which would give her a BMI of below 40.               ROS: Noncontributory  Objective: Vital Signs: LMP 08/01/2012   Physical Exam:  Right knee: Trace effusion, no warmth or erythema.  Full active extension and flexion of 125 degrees.  Tender along the medial joint line.  She has 1+ laxity with valgus stress but still a solid endpoint.  Imaging: None today.  Assessment & Plan: 1.  End-stage right knee medial compartment DJD -Out of work until next Tuesday.  We will complete her FMLA paperwork when available.  We will try a medial compartment unloading brace.  She will keep working on low carbohydrate diet to lose weight so that she can eventually have knee replacement.     Procedures: No procedures performed  No notes on file     PMFS History: Patient Active Problem List   Diagnosis Date Noted  . Vitamin D deficiency 07/26/2018  . Class 3 severe obesity with serious comorbidity and body mass index (BMI) of 45.0 to 49.9 in adult ([place]) 07/26/2018  . Status post arthroscopic surgery of right  knee 05/09/2018  . Chronic pain of right knee 05/09/2018  . Closed fracture of right tibial plateau   . Unilateral primary osteoarthritis, right knee 04/23/2018  . Acute medial meniscus tear of right knee 04/23/2018  . Closed fracture of medial plateau of left tibia 04/23/2018  . Severe obesity (BMI >= 40) ([place]) 04/23/2018  . Osteoarthritis of right knee 03/09/2018  . Prediabetes 03/09/2018  . Bilateral knee pain 12/28/2017  . Constipation 12/18/2017  . Cough 12/15/2017  . Lower resp. tract infection 12/15/2017  . Acute conjunctivitis of right eye 12/15/2017  . H/O Paget's disease of bone 09/10/2017  . OSA on CPAP 10/28/2015  . Fibroids 08/07/2015  . Plantar fasciitis, bilateral 08/03/2015  . Metatarsal deformity 08/03/2015  . Equinus deformity of foot, acquired 08/03/2015  . Pronation deformity of both feet 08/03/2015  . Morbid obesity ([place]) 06/11/2015  . 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 Essential (primary) hypertension 06/11/2015  . 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 Benign essential HTN 09/19/2013  . Major depressive episode 09/19/2013   Past Medical History:  Diagnosis Date  . Allergy   . Arthritis of both knees   . Bilateral chronic knee pain   . Chronic back pain   . Constipation   .  DDD (degenerative disc disease), cervical   . Depression    no meds  . Genital herpes    HSV type 2 at rectum, culture positive 3/15  . Glaucoma   . Grade I diastolic dysfunction 07/19/3233   noted on ECHO   . Heart murmur   . History of colon polyps   . HLD (hyperlipidemia)   . Hypertension   . Hypothyroidism   . Insomnia   . Internal hemorrhoids   . Lactose intolerance   . Lupus ([place])   . LVH (left ventricular hypertrophy) 06/25/2015   Mild, noted on ECHO   . Mild sleep apnea    1/17 CPAP Dr Claiborne Billings does not use cpap   . Morbid obesity ([place]) 06/11/2015  . Osteoporosis   . Paget's disease   . Paget's disease of bone    Dr Trudie Reed, Reclast insufion 02/14/10 - bad  reaction to infusion, now tolerates fosamax weekly  . Plantar fasciitis, bilateral   . PMB (postmenopausal bleeding)   . Pre-diabetes   . Sleep apnea   . Thrombocytopenia ([place])   . TR (tricuspid regurgitation) 06/25/2015   Trace, noted on ECHO   . Tubular adenoma polyp of rectum    8/12, Dr Fuller Plan, 5 yr follow up   . Uterine fibroid 10/19/2017   noted on CT pelvis  . Vitamin D deficiency     Family History  Problem Relation Age of Onset  . Hypertension Mother   . Heart disease Mother   . Hypertension Father   . Hypertension Sister   . Cancer Maternal Grandmother   . Stomach cancer Paternal Grandmother   . Hypertension Son   . Hypertension Son   . Colon cancer Neg Hx     Past Surgical History:  Procedure Laterality Date  . AUGMENTATION MAMMAPLASTY Bilateral   . BREAST ENHANCEMENT SURGERY    . BREAST REDUCTION SURGERY    . COLONOSCOPY  01/19/2016  . DILATION AND CURETTAGE OF UTERUS N/A 06/24/2015   Procedure: DILATATION AND CURETTAGE;  Surgeon: Emily Filbert, MD;  Location: [facility];  Service: Gynecology;  Laterality: N/A;  . KNEE ARTHROSCOPY WITH SUBCHONDROPLASTY Right 05/03/2018   Procedure: RIGHT KNEE ARTHROSCOPY WITH DEBRIDEMENT, PARTIAL MEDIAL MENISCECTOMY SUBCHONDROPLASTY MEDIAL TIBIAL PLATEAU AND MEDIAL FEMORAL CONDYLE;  Surgeon: Mcarthur Rossetti, MD;  Location: [facility];  Service: Orthopedics;  Laterality: Right;  . REDUCTION MAMMAPLASTY Bilateral   . TOE SURGERY     removal of bone in each foot  . TUBAL LIGATION     Social History   Occupational History  . Occupation: Housekeeping- [employer]  Tobacco Use  . Smoking status: Former Smoker    Years: 7.00    Types: Cigars    Last attempt to quit: 08/11/2008    Years since quitting: 10.1  . Smokeless tobacco: Never Used  . Tobacco comment: smoked Black&Milds, 1 pack per day (5 in a pack)  Substance and Sexual Activity  . Alcohol use: Yes    Alcohol/week: 5.0 standard drinks    Types: 5 Cans of beer per week     Comment: occ   . Drug use: Never  . Sexual activity: Not Currently    Partners: Male    Birth control/protection: Surgical

## 2018-09-18 ENCOUNTER — Telehealth (INDEPENDENT_AMBULATORY_CARE_PROVIDER_SITE_OTHER): Payer: Self-pay | Admitting: Family Medicine

## 2018-09-18 NOTE — Telephone Encounter (Signed)
Faxed order for knee brace along with demographics to Thurmond Butts w/ [facility] [employer]

## 2018-09-20 DIAGNOSIS — E119 Type 2 diabetes mellitus without complications: Secondary | ICD-10-CM | POA: Diagnosis not present

## 2018-09-20 DIAGNOSIS — N840 Polyp of corpus uteri: Secondary | ICD-10-CM | POA: Diagnosis not present

## 2018-09-20 DIAGNOSIS — N950 Postmenopausal bleeding: Secondary | ICD-10-CM | POA: Diagnosis not present

## 2018-09-24 ENCOUNTER — Other Ambulatory Visit: Payer: Self-pay

## 2018-09-24 ENCOUNTER — Encounter (INDEPENDENT_AMBULATORY_CARE_PROVIDER_SITE_OTHER): Payer: Self-pay | Admitting: Family Medicine

## 2018-09-24 ENCOUNTER — Ambulatory Visit (INDEPENDENT_AMBULATORY_CARE_PROVIDER_SITE_OTHER): Payer: 59 | Admitting: Family Medicine

## 2018-09-24 DIAGNOSIS — M1711 Unilateral primary osteoarthritis, right knee: Secondary | ICD-10-CM

## 2018-09-24 DIAGNOSIS — M25561 Pain in right knee: Secondary | ICD-10-CM

## 2018-09-24 NOTE — Progress Notes (Signed)
Subjective: She is here with persistent right knee pain.  Has not yet received her brace, does not feel like she can return to work yet without it.  Objective: No exam done today.  Impression: Right knee osteoarthritis  Plan: We will extend her out of work note until April 6 while awaiting her knee brace.

## 2018-10-01 ENCOUNTER — Telehealth (INDEPENDENT_AMBULATORY_CARE_PROVIDER_SITE_OTHER): Payer: Self-pay | Admitting: Family Medicine

## 2018-10-01 NOTE — Telephone Encounter (Signed)
Patient called stating that she has not heard from either [place] or [place] about her knee brace.  CB#[phone].  Thank you.

## 2018-10-01 NOTE — Telephone Encounter (Signed)
I called and left a voice mail on Michelle Huerta's voice mail [phone]) to call back and let me know if #1 - he received the knee brace order on 09/18/2018 and #2 - if he could give me a timeline as to when the patient will be able to get the brace.  I called the patient and advised her I will call her back once I get some more information from Kim.

## 2018-10-02 NOTE — Telephone Encounter (Signed)
I called the patient today: I spoke with Ryan yesterday. He has not been allowed in our building (no reps have been allowed in) due to the covid-19 pandemic. This is why he has not been in touch with the patient.  I advised the patient of this.  Our office manager, Randall Hiss, has Agustin Cree coming today (he is in the area today) and meeting the patient here. I asked her all of the screening questions (all negative) and left the screening questions on Ryan's voice mail - he is to call back immediately if any of these answers are positive. I also left the patient's number where she can be reached today on his voice mail, so that he can make contact with her to schedule a time.

## 2018-10-03 ENCOUNTER — Ambulatory Visit (INDEPENDENT_AMBULATORY_CARE_PROVIDER_SITE_OTHER): Payer: Self-pay | Admitting: Family Medicine

## 2018-10-03 ENCOUNTER — Telehealth (INDEPENDENT_AMBULATORY_CARE_PROVIDER_SITE_OTHER): Payer: Self-pay | Admitting: Family Medicine

## 2018-10-03 ENCOUNTER — Encounter (INDEPENDENT_AMBULATORY_CARE_PROVIDER_SITE_OTHER): Payer: Self-pay

## 2018-10-03 NOTE — Telephone Encounter (Signed)
Would you please check on the status of this  FMLA form and advise the patient?

## 2018-10-03 NOTE — Telephone Encounter (Signed)
New Message  Pt verbalized they are still waiting for letter to be sent to [employer].  Please f/u with patient

## 2018-10-04 ENCOUNTER — Encounter (INDEPENDENT_AMBULATORY_CARE_PROVIDER_SITE_OTHER): Payer: Self-pay

## 2018-10-04 ENCOUNTER — Telehealth (INDEPENDENT_AMBULATORY_CARE_PROVIDER_SITE_OTHER): Payer: Self-pay | Admitting: Family Medicine

## 2018-10-04 NOTE — Telephone Encounter (Signed)
Patient called advised Michelle Huerta from [employer] faxed over a form to be completed and faxed back to him. Claim# is [id number] The fax# is [phone]   The ph# is [phone]  The number to contact patient is [phone]

## 2018-10-05 ENCOUNTER — Telehealth (INDEPENDENT_AMBULATORY_CARE_PROVIDER_SITE_OTHER): Payer: Self-pay | Admitting: Family Medicine

## 2018-10-05 NOTE — Telephone Encounter (Signed)
Patient left a voicemail message requesting for you to call her.  CB#[phone].  Thank you.

## 2018-10-05 NOTE — Telephone Encounter (Signed)
I spoke with Michelle Huerta. She said she spoke with her insurance company and they have not received any paperwork from [place] yet. She said she was told by them that it could take a month before the approval process was finished. She has left a message for Thurmond Butts to give her a call back about this, but has not heard back from him yet.  She is worried about this because she says she cannot work without the brace. I told her to wait until she hears from [place] and then let us know next week what he says about when she can get the brace, before any work note changes are made.

## 2018-10-05 NOTE — Telephone Encounter (Signed)
I advised her the form did arrive and should be at the [facility] office later today. They will get in touch with her about the form. She is still waiting for the knee brace, as she has only had the fitting done by Thurmond Butts with [facility]). He has submitted the paperwork to the insurance company and is awaiting their response, per the patient. She is hoping to get the brace by next Friday, as she is supposed to return to work the Monday after that (4/06). She will let us know if she needs another extension on the work note (if she still does not have the brace by that time).

## 2018-10-05 NOTE — Telephone Encounter (Signed)
I tried calling the patient - no answer.

## 2018-10-09 DIAGNOSIS — M1711 Unilateral primary osteoarthritis, right knee: Secondary | ICD-10-CM | POA: Diagnosis not present

## 2018-10-11 ENCOUNTER — Telehealth (INDEPENDENT_AMBULATORY_CARE_PROVIDER_SITE_OTHER): Payer: Self-pay | Admitting: Family Medicine

## 2018-10-11 NOTE — Telephone Encounter (Signed)
Patient came into the clinic and requested a return to work note that states she can return to work on Monday, April 6th.  CB#[phone].  Thank you

## 2018-10-11 NOTE — Telephone Encounter (Signed)
faxed

## 2018-10-11 NOTE — Telephone Encounter (Signed)
Patient asked that the note be faxed to her employer at [phone].  Thank you.

## 2018-10-11 NOTE — Telephone Encounter (Signed)
IC s/w patient. Advised I had faxed note keeping note out until 04/06

## 2018-10-25 ENCOUNTER — Encounter (INDEPENDENT_AMBULATORY_CARE_PROVIDER_SITE_OTHER): Payer: Self-pay

## 2018-10-25 ENCOUNTER — Ambulatory Visit (INDEPENDENT_AMBULATORY_CARE_PROVIDER_SITE_OTHER): Payer: 59 | Admitting: Family Medicine

## 2018-10-25 ENCOUNTER — Other Ambulatory Visit: Payer: Self-pay

## 2018-10-25 ENCOUNTER — Encounter (INDEPENDENT_AMBULATORY_CARE_PROVIDER_SITE_OTHER): Payer: Self-pay | Admitting: Family Medicine

## 2018-10-25 DIAGNOSIS — M1711 Unilateral primary osteoarthritis, right knee: Secondary | ICD-10-CM | POA: Diagnosis not present

## 2018-10-25 NOTE — Progress Notes (Signed)
Office Visit Note   Patient: Michelle Huerta           Date of Birth: 21-Jul-1960           MRN: [id number] Visit Date: 10/25/2018 Requested by: Forrest Moron, MD [place], [place] [id number] PCP: Forrest Moron, MD  Subjective: Chief Complaint  Patient presents with  . Right Knee - Pain    Has OA brace.  It will not stay in place around the knee - slides down as she is on her feet.    HPI: She is here with persistent right knee pain.  She has returned to work with her brace, but unfortunately the brace slides down and does not provide support.  When she first puts it on it feels good, but due to the shape of her leg, brace will not stay.  She is trying to lose weight but has only lost a couple pounds.              ROS: No fevers or chills or respiratory symptoms.  All other systems were reviewed and are negative.  Objective: Vital Signs: LMP 08/01/2012   Physical Exam:  General:  Alert and oriented, in no acute distress. Pulm:  Breathing unlabored. Psy:  Normal mood, congruent affect. Skin: No rash on the skin. Right knee: Tender on the medial and lateral joint lines.  Still has good range of motion.  Imaging: None today.  Assessment & Plan: 1.  Right knee osteoarthritis -We will keep her out of work for 6 weeks while she contacts Ryan to see if her brace can be adjusted, and continues to work aggressively on weight loss.     Procedures: No procedures performed  No notes on file     PMFS History: Patient Active Problem List   Diagnosis Date Noted  . Vitamin D deficiency 07/26/2018  . Class 3 severe obesity with serious comorbidity and body mass index (BMI) of 45.0 to 49.9 in adult ([place]) 07/26/2018  . Status post arthroscopic surgery of right knee 05/09/2018  . Chronic pain of right knee 05/09/2018  . Closed fracture of right tibial plateau   . Unilateral primary osteoarthritis, right knee 04/23/2018  . Acute medial meniscus tear of right knee  04/23/2018  . Closed fracture of medial plateau of left tibia 04/23/2018  . Severe obesity (BMI >= 40) ([place]) 04/23/2018  . Osteoarthritis of right knee 03/09/2018  . Prediabetes 03/09/2018  . Bilateral knee pain 12/28/2017  . Constipation 12/18/2017  . Cough 12/15/2017  . Lower resp. tract infection 12/15/2017  . Acute conjunctivitis of right eye 12/15/2017  . H/O Paget's disease of bone 09/10/2017  . OSA on CPAP 10/28/2015  . Fibroids 08/07/2015  . Plantar fasciitis, bilateral 08/03/2015  . Metatarsal deformity 08/03/2015  . Equinus deformity of foot, acquired 08/03/2015  . Pronation deformity of both feet 08/03/2015  . Morbid obesity ([place]) 06/11/2015  . 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 Essential (primary) hypertension 06/11/2015  . 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 Benign essential HTN 09/19/2013  . Major depressive episode 09/19/2013   Past Medical History:  Diagnosis Date  . Allergy   . Arthritis of both knees   . Bilateral chronic knee pain   . Chronic back pain   . Constipation   . DDD (degenerative disc disease), cervical   . Depression    no meds  . Genital herpes    HSV type 2 at rectum, culture positive 3/15  .  Glaucoma   . Grade I diastolic dysfunction 84/13/2440   noted on ECHO   . Heart murmur   . History of colon polyps   . HLD (hyperlipidemia)   . Hypertension   . Hypothyroidism   . Insomnia   . Internal hemorrhoids   . Lactose intolerance   . Lupus ([place])   . LVH (left ventricular hypertrophy) 06/25/2015   Mild, noted on ECHO   . Mild sleep apnea    1/17 CPAP Dr Claiborne Billings does not use cpap   . Morbid obesity ([place]) 06/11/2015  . Osteoporosis   . Paget's disease   . Paget's disease of bone    Dr Trudie Reed, Reclast insufion 02/14/10 - bad reaction to infusion, now tolerates fosamax weekly  . Plantar fasciitis, bilateral   . PMB (postmenopausal bleeding)   . Pre-diabetes   . Sleep apnea   . Thrombocytopenia ([place])   . TR (tricuspid  regurgitation) 06/25/2015   Trace, noted on ECHO   . Tubular adenoma polyp of rectum    8/12, Dr Fuller Plan, 5 yr follow up   . Uterine fibroid 10/19/2017   noted on CT pelvis  . Vitamin D deficiency     Family History  Problem Relation Age of Onset  . Hypertension Mother   . Heart disease Mother   . Hypertension Father   . Hypertension Sister   . Cancer Maternal Grandmother   . Stomach cancer Paternal Grandmother   . Hypertension Son   . Hypertension Son   . Colon cancer Neg Hx     Past Surgical History:  Procedure Laterality Date  . AUGMENTATION MAMMAPLASTY Bilateral   . BREAST ENHANCEMENT SURGERY    . BREAST REDUCTION SURGERY    . COLONOSCOPY  01/19/2016  . DILATION AND CURETTAGE OF UTERUS N/A 06/24/2015   Procedure: DILATATION AND CURETTAGE;  Surgeon: Emily Filbert, MD;  Location: [facility];  Service: Gynecology;  Laterality: N/A;  . KNEE ARTHROSCOPY WITH SUBCHONDROPLASTY Right 05/03/2018   Procedure: RIGHT KNEE ARTHROSCOPY WITH DEBRIDEMENT, PARTIAL MEDIAL MENISCECTOMY SUBCHONDROPLASTY MEDIAL TIBIAL PLATEAU AND MEDIAL FEMORAL CONDYLE;  Surgeon: Mcarthur Rossetti, MD;  Location: [facility];  Service: Orthopedics;  Laterality: Right;  . REDUCTION MAMMAPLASTY Bilateral   . TOE SURGERY     removal of bone in each foot  . TUBAL LIGATION     Social History   Occupational History  . Occupation: Housekeeping- [employer]  Tobacco Use  . Smoking status: Former Smoker    Years: 7.00    Types: Cigars    Last attempt to quit: 08/11/2008    Years since quitting: 10.2  . Smokeless tobacco: Never Used  . Tobacco comment: smoked Black&Milds, 1 pack per day (5 in a pack)  Substance and Sexual Activity  . Alcohol use: Yes    Alcohol/week: 5.0 standard drinks    Types: 5 Cans of beer per week    Comment: occ   . Drug use: Never  . Sexual activity: Not Currently    Partners: Male    Birth control/protection: Surgical

## 2018-10-29 ENCOUNTER — Encounter (INDEPENDENT_AMBULATORY_CARE_PROVIDER_SITE_OTHER): Payer: Self-pay | Admitting: Family Medicine

## 2018-11-06 MED FILL — VALACYCLOVIR HCL 500 MG TAB: 500 | 90 days supply | Qty: 90 | Fill #0

## 2018-11-06 MED FILL — IBUPROFEN 800 MG TAB: 800 | 30 days supply | Qty: 90 | Fill #0

## 2018-11-06 MED FILL — TELMISARTAN 80 MG TABS: 80 | 90 days supply | Qty: 90 | Fill #0

## 2018-11-06 MED FILL — metFORMIN HCL 500 MG TABS: 500 | 30 days supply | Qty: 30 | Fill #0

## 2018-11-06 MED FILL — AMLODIPINE BESYLATE 5 MG TA: 5 | 90 days supply | Qty: 90 | Fill #0

## 2018-11-07 MED FILL — TEMAZEPAM 30 MG CAPSULE: 30 | 30 days supply | Qty: 30 | Fill #0

## 2018-11-19 ENCOUNTER — Encounter (INDEPENDENT_AMBULATORY_CARE_PROVIDER_SITE_OTHER): Payer: Self-pay | Admitting: Bariatrics

## 2018-11-19 ENCOUNTER — Ambulatory Visit (INDEPENDENT_AMBULATORY_CARE_PROVIDER_SITE_OTHER): Payer: 59 | Admitting: Bariatrics

## 2018-11-19 ENCOUNTER — Other Ambulatory Visit: Payer: Self-pay

## 2018-11-19 DIAGNOSIS — E559 Vitamin D deficiency, unspecified: Secondary | ICD-10-CM | POA: Diagnosis not present

## 2018-11-19 DIAGNOSIS — E66813 Obesity, class 3: Secondary | ICD-10-CM

## 2018-11-19 DIAGNOSIS — Z6841 Body Mass Index (BMI) 40.0 and over, adult: Secondary | ICD-10-CM | POA: Diagnosis not present

## 2018-11-19 DIAGNOSIS — R7303 Prediabetes: Secondary | ICD-10-CM | POA: Diagnosis not present

## 2018-11-20 MED ORDER — METFORMIN HCL 500 MG PO TABS: 500.0000 mg | ORAL_TABLET | Freq: Two times a day (BID) | ORAL | 0 refills | Status: DC

## 2018-11-20 MED FILL — metFORMIN HCL 500 MG TABS: 500 | 30 days supply | Qty: 60 | Fill #0

## 2018-11-20 NOTE — Progress Notes (Signed)
Office: [phone]  /  Fax: [phone] TeleHealth Visit:  Michelle Huerta has verbally consented to this TeleHealth visit today. The patient is located at home, the provider is located at the [employer] [facility] office. The participants in this visit include the listed provider and patient and any and all parties involved. The visit was conducted today via WebEx.  HPI:   Chief Complaint: OBESITY Michelle Huerta is here to discuss her progress with her obesity treatment plan. She is on the Category 3 plan and is following her eating plan approximately 30 % of the time. She states she is exercising 0 minutes 0 times per week. Michelle Huerta states that her weight has remained the same since her last visit. Her last visit was on 09/04/18 with [facility], [facility]. She typically sees Dr. Leafy Ro and this is her first visit with me. We were unable to weigh the patient today for this TeleHealth visit. She feels as if she has maintained weight since her last visit. She has lost 5 lbs since starting treatment with [place].  Pre-Diabetes Michelle Huerta has a diagnosis of prediabetes based on her elevated Hgb A1c and was informed this puts her at greater risk of developing diabetes. Michelle Huerta is taking metformin 500 mg once daily and she continues to work on diet and exercise to decrease risk of diabetes. She denies nausea or hypoglycemia.  Vitamin D deficiency Michelle Huerta has a diagnosis of vitamin D deficiency. She is taking vit D and denies nausea, vomiting or muscle weakness.  ASSESSMENT AND PLAN:  Prediabetes - Plan: metFORMIN (GLUCOPHAGE) 500 MG tablet  Vitamin D deficiency  Class 3 severe obesity with serious comorbidity and body mass index (BMI) of 45.0 to 49.9 in adult, unspecified obesity type [facility])  PLAN:  Pre-Diabetes Michelle Huerta will continue to work on weight loss, exercise, and decreasing simple carbohydrates in her diet to help decrease the risk of diabetes. She was informed that eating too many simple carbohydrates or too  many calories at one sitting increases the likelihood of GI side effects. Shylo agreed to change her dosage of Metformin and increase 500 mg to twice daily from once daily #60 with no refills and follow up with [place] as directed to monitor her progress.  Vitamin D Deficiency Michelle Huerta was informed that low vitamin D levels contributes to fatigue and are associated with obesity, breast, and colon cancer. She will continue to take prescription Vit D @50 ,000 IU every week and will follow up for routine testing of vitamin D, at least 2-3 times per year. She was informed of the risk of over-replacement of vitamin D and agrees to not increase her dose unless she discusses this with [place] first.  Obesity Michelle Huerta is currently in the action stage of change. As such, her goal is to continue with weight loss efforts She has agreed to follow the Category 3 plan Michelle Huerta has been instructed to work up to a goal of 150 minutes of combined cardio and strengthening exercise per week for weight loss and overall health benefits. We discussed the following Behavioral Modification Strategies today: increase H2O intake, no skipping meals, keeping healthy foods in the home, better snacking choices, increasing lean protein intake, decreasing simple carbohydrates, increasing vegetables, decrease eating out and work on meal planning and easy cooking plans Michelle Huerta will weigh herself at home before each visit.  Michelle Huerta has agreed to follow up with our clinic in 2 weeks. She was informed of the importance of frequent follow up visits to maximize her success  with intensive lifestyle modifications for her multiple health conditions.  ALLERGIES: Allergies  Allergen Reactions  . Penicillins Rash and Other (See Comments)    Has patient had a PCN reaction causing immediate rash, facial/tongue/throat swelling, SOB or lightheadedness with hypotension: Yes Has patient had a PCN reaction causing severe rash involving mucus membranes or skin necrosis: No Has  patient had a PCN reaction that required hospitalization: No Has patient had a PCN reaction occurring within the last 10 years: No If all of the above answers are "NO", then may proceed with Cephalosporin use.   [name] Reclast [Zoledronic Acid] Nausea And Vomiting    MEDICATIONS: Current Outpatient Medications on File Prior to Visit  Medication Sig Dispense Refill  . amLODipine (NORVASC) 5 MG tablet Take 1 tablet (5 mg total) by mouth daily. 90 tablet 3  . cetirizine (ZYRTEC) 10 MG tablet Take 10 mg by mouth daily.    . Estradiol 10 MCG INST Place 10 mcg vaginally 2 (two) times a week. (Patient not taking: Reported on 09/17/2018) 12 each 1  . ibuprofen (ADVIL,MOTRIN) 800 MG tablet Take 1 tablet (800 mg total) by mouth every 8 (eight) hours as needed. 90 tablet 0  . MELATONIN PO Take by mouth.    . metFORMIN (GLUCOPHAGE) 500 MG tablet Take 1 tablet (500 mg total) by mouth daily with breakfast. 30 tablet 0  . misoprostol (CYTOTEC) 200 MCG tablet Cytotec 200 mcg tablet  insert one tablet per vagina at bedtime night prior to procedure    . telmisartan (MICARDIS) 80 MG tablet Take 1 tablet (80 mg total) by mouth daily. 90 tablet 3  . temazepam (RESTORIL) 30 MG capsule     . valACYclovir (VALTREX) 500 MG tablet Take 1 tablet (500 mg total) by mouth daily. 90 tablet 3  . Vitamin D, Ergocalciferol, (DRISDOL) 1.25 MG (50000 UT) CAPS capsule Take 1 capsule (50,000 Units total) by mouth every 7 (seven) days. 4 capsule 0   No current facility-administered medications on file prior to visit.     PAST MEDICAL HISTORY: Past Medical History:  Diagnosis Date  . Allergy   . Arthritis of both knees   . Bilateral chronic knee pain   . Chronic back pain   . Constipation   . DDD (degenerative disc disease), cervical   . Depression    no meds  . Genital herpes    HSV type 2 at rectum, culture positive 3/15  . Glaucoma   . Grade I diastolic dysfunction 74/06/8785   noted on ECHO   . Heart murmur   .  History of colon polyps   . HLD (hyperlipidemia)   . Hypertension   . Hypothyroidism   . Insomnia   . Internal hemorrhoids   . Lactose intolerance   . Lupus ([place])   . LVH (left ventricular hypertrophy) 06/25/2015   Mild, noted on ECHO   . Mild sleep apnea    1/17 CPAP Dr Claiborne Billings does not use cpap   . Morbid obesity ([place]) 06/11/2015  . Osteoporosis   . Paget's disease   . Paget's disease of bone    Dr Trudie Reed, Reclast insufion 02/14/10 - bad reaction to infusion, now tolerates fosamax weekly  . Plantar fasciitis, bilateral   . PMB (postmenopausal bleeding)   . Pre-diabetes   . Sleep apnea   . Thrombocytopenia ([place])   . TR (tricuspid regurgitation) 06/25/2015   Trace, noted on ECHO   . Tubular adenoma polyp of rectum    8/12, Dr  Fuller Plan, 5 yr follow up   . Uterine fibroid 10/19/2017   noted on CT pelvis  . Vitamin D deficiency     PAST SURGICAL HISTORY: Past Surgical History:  Procedure Laterality Date  . AUGMENTATION MAMMAPLASTY Bilateral   . BREAST ENHANCEMENT SURGERY    . BREAST REDUCTION SURGERY    . COLONOSCOPY  01/19/2016  . DILATION AND CURETTAGE OF UTERUS N/A 06/24/2015   Procedure: DILATATION AND CURETTAGE;  Surgeon: Emily Filbert, MD;  Location: [facility];  Service: Gynecology;  Laterality: N/A;  . KNEE ARTHROSCOPY WITH SUBCHONDROPLASTY Right 05/03/2018   Procedure: RIGHT KNEE ARTHROSCOPY WITH DEBRIDEMENT, PARTIAL MEDIAL MENISCECTOMY SUBCHONDROPLASTY MEDIAL TIBIAL PLATEAU AND MEDIAL FEMORAL CONDYLE;  Surgeon: Mcarthur Rossetti, MD;  Location: [facility];  Service: Orthopedics;  Laterality: Right;  . REDUCTION MAMMAPLASTY Bilateral   . TOE SURGERY     removal of bone in each foot  . TUBAL LIGATION      SOCIAL HISTORY: Social History   Tobacco Use  . Smoking status: Former Smoker    Years: 7.00    Types: Cigars    Last attempt to quit: 08/11/2008    Years since quitting: 10.2  . Smokeless tobacco: Never Used  . Tobacco comment: smoked Black&Milds, 1 pack per day (5  in a pack)  Substance Use Topics  . Alcohol use: Yes    Alcohol/week: 5.0 standard drinks    Types: 5 Cans of beer per week    Comment: occ   . Drug use: Never    FAMILY HISTORY: Family History  Problem Relation Age of Onset  . Hypertension Mother   . Heart disease Mother   . Hypertension Father   . Hypertension Sister   . Cancer Maternal Grandmother   . Stomach cancer Paternal Grandmother   . Hypertension Son   . Hypertension Son   . Colon cancer Neg Hx     ROS: Review of Systems  Constitutional: Negative for weight loss.  Gastrointestinal: Negative for nausea and vomiting.  Musculoskeletal:       Negative for muscle weakness  Endo/Heme/Allergies:       Negative for hypoglycemia    PHYSICAL EXAM: Pt in no acute distress  RECENT LABS AND TESTS: BMET    Component Value Date/Time   NA 141 09/10/2018 1151   K 3.9 09/10/2018 1151   CL 105 09/10/2018 1151   CO2 22 09/10/2018 1151   GLUCOSE 103 (H) 09/10/2018 1151   GLUCOSE 104 (H) 04/26/2018 0837   BUN 14 09/10/2018 1151   CREATININE 0.85 09/10/2018 1151   CALCIUM 9.3 09/10/2018 1151   GFRNONAA 77 09/10/2018 1151   GFRAA 89 09/10/2018 1151   Lab Results  Component Value Date   HGBA1C 5.9 (A) 09/10/2018   HGBA1C 6.3 (H) 05/30/2018   HGBA1C 6.0 (A) 03/09/2018   Lab Results  Component Value Date   INSULIN 41.4 (H) 05/30/2018   CBC    Component Value Date/Time   WBC 6.4 05/30/2018 1116   WBC 6.0 04/26/2018 0837   RBC 4.57 05/30/2018 1116   RBC 4.71 04/26/2018 0837   HGB 11.6 05/30/2018 1116   HCT 37.0 05/30/2018 1116   PLT 173 05/30/2018 1116   MCV 81 05/30/2018 1116   MCH 25.4 (L) 05/30/2018 1116   MCH 25.9 (L) 04/26/2018 0837   MCHC 31.4 (L) 05/30/2018 1116   MCHC 29.8 (L) 04/26/2018 0837   RDW 13.7 05/30/2018 1116   LYMPHSABS 2.4 05/30/2018 1116   MONOABS 0.4 06/19/2012  0936   EOSABS 0.1 05/30/2018 1116   BASOSABS 0.0 05/30/2018 1116   Iron/TIBC/Ferritin/ %Sat No results found for: IRON,  TIBC, FERRITIN, IRONPCTSAT Lipid Panel     Component Value Date/Time   CHOL 134 05/30/2018 1116   TRIG 85 05/30/2018 1116   HDL 75 05/30/2018 1116   CHOLHDL 1.8 05/30/2018 1116   LDLCALC 42 05/30/2018 1116   Hepatic Function Panel     Component Value Date/Time   PROT 7.2 09/10/2018 1151   ALBUMIN 3.9 09/10/2018 1151   AST 45 (H) 09/10/2018 1151   ALT 54 (H) 09/10/2018 1151   ALKPHOS 91 09/10/2018 1151   BILITOT 0.4 09/10/2018 1151      Component Value Date/Time   TSH 1.280 05/30/2018 1116   TSH 1.220 04/26/2018 1647   TSH 1.738 04/06/2015 1520     Ref. Range 09/10/2018 11:51  Vitamin D, 25-Hydroxy Latest Ref Range: 30.0 - 100.0 ng/mL 42.3    I, Doreene Nest, am acting as Location manager for [employer]. Owens Shark, DO  I have reviewed the above documentation for accuracy and completeness, and I agree with the above. -Jearld Lesch, DO

## 2018-11-28 ENCOUNTER — Other Ambulatory Visit: Payer: Self-pay

## 2018-11-28 ENCOUNTER — Ambulatory Visit: Payer: 59 | Admitting: Family Medicine

## 2018-11-28 ENCOUNTER — Encounter: Payer: Self-pay | Admitting: Family Medicine

## 2018-11-28 VITALS — Ht 67.0 in | Wt 292.2 lb

## 2018-11-28 DIAGNOSIS — M1711 Unilateral primary osteoarthritis, right knee: Secondary | ICD-10-CM | POA: Diagnosis not present

## 2018-11-28 NOTE — Progress Notes (Signed)
Office Visit Note   Patient: Michelle Huerta           Date of Birth: 01-18-1961           MRN: [id number] Visit Date: 11/28/2018 Requested by: Forrest Moron, MD [place], [place] [id number] PCP: Forrest Moron, MD  Subjective: Chief Complaint  Patient presents with  . Right Knee - Pain, Follow-up    Still cannot stand over 45 minutes without pain. Brace is still not working for her - continues to slide down.    HPI: She is here for follow-up right knee pain due to osteoarthritis.  No change in symptoms.  Still unable to work her regular job, cannot stand very long without severe pain.  Her knee brace keeps sliding down.  She has not lost any weight, in fact she gained a pound since February.  She is not following a healthy diet, and is unable to exercise.               ROS: No fevers or chills or respiratory symptoms.  All other systems were reviewed and are negative.  Objective: Vital Signs: Ht 5\' 7"  (1.702 m)   Wt 292 lb 3.2 oz (132.5 kg)   LMP 08/01/2012   BMI 45.76 kg/m   Physical Exam:  General:  Alert and oriented, in no acute distress. Pulm:  Breathing unlabored. Psy:  Normal mood, congruent affect. Skin: No rash on her skin visible. Right knee: No effusion today, tender along the medial joint line.  Full active extension, flexion limited to about 120 degrees.  Imaging: None today.  Assessment & Plan: 1.  Persistent right knee pain with morbid obesity -We will try to modify her work starting June 1, reduced work hours with ability to sit when needed.  If her employer cannot accommodate that, she might not be able to return to work until she is able to have her knee replaced. -Emphasized the importance of weight loss in order to qualify for knee replacement, and also to help her artificial joint last longer.     Procedures: No procedures performed  No notes on file     PMFS History: Patient Active Problem List   Diagnosis Date Noted  .  Vitamin D deficiency 07/26/2018  . Class 3 severe obesity with serious comorbidity and body mass index (BMI) of 45.0 to 49.9 in adult ([place]) 07/26/2018  . Status post arthroscopic surgery of right knee 05/09/2018  . Chronic pain of right knee 05/09/2018  . Closed fracture of right tibial plateau   . Unilateral primary osteoarthritis, right knee 04/23/2018  . Acute medial meniscus tear of right knee 04/23/2018  . Closed fracture of medial plateau of left tibia 04/23/2018  . Severe obesity (BMI >= 40) ([place]) 04/23/2018  . Osteoarthritis of right knee 03/09/2018  . Prediabetes 03/09/2018  . Bilateral knee pain 12/28/2017  . Constipation 12/18/2017  . Cough 12/15/2017  . Lower resp. tract infection 12/15/2017  . Acute conjunctivitis of right eye 12/15/2017  . H/O Paget's disease of bone 09/10/2017  . OSA on CPAP 10/28/2015  . Fibroids 08/07/2015  . Plantar fasciitis, bilateral 08/03/2015  . Metatarsal deformity 08/03/2015  . Equinus deformity of foot, acquired 08/03/2015  . Pronation deformity of both feet 08/03/2015  . Morbid obesity ([place]) 06/11/2015  . 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 Essential (primary) hypertension 06/11/2015  . 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 Benign essential HTN 09/19/2013  . Major depressive episode  09/19/2013   Past Medical History:  Diagnosis Date  . Allergy   . Arthritis of both knees   . Bilateral chronic knee pain   . Chronic back pain   . Constipation   . DDD (degenerative disc disease), cervical   . Depression    no meds  . Genital herpes    HSV type 2 at rectum, culture positive 3/15  . Glaucoma   . Grade I diastolic dysfunction 25/36/6440   noted on ECHO   . Heart murmur   . History of colon polyps   . HLD (hyperlipidemia)   . Hypertension   . Hypothyroidism   . Insomnia   . Internal hemorrhoids   . Lactose intolerance   . Lupus ([place])   . LVH (left ventricular hypertrophy) 06/25/2015   Mild, noted on ECHO   . Mild sleep  apnea    1/17 CPAP Dr Claiborne Billings does not use cpap   . Morbid obesity ([place]) 06/11/2015  . Osteoporosis   . Paget's disease   . Paget's disease of bone    Dr Trudie Reed, Reclast insufion 02/14/10 - bad reaction to infusion, now tolerates fosamax weekly  . Plantar fasciitis, bilateral   . PMB (postmenopausal bleeding)   . Pre-diabetes   . Sleep apnea   . Thrombocytopenia ([place])   . TR (tricuspid regurgitation) 06/25/2015   Trace, noted on ECHO   . Tubular adenoma polyp of rectum    8/12, Dr Fuller Plan, 5 yr follow up   . Uterine fibroid 10/19/2017   noted on CT pelvis  . Vitamin D deficiency     Family History  Problem Relation Age of Onset  . Hypertension Mother   . Heart disease Mother   . Hypertension Father   . Hypertension Sister   . Cancer Maternal Grandmother   . Stomach cancer Paternal Grandmother   . Hypertension Son   . Hypertension Son   . Colon cancer Neg Hx     Past Surgical History:  Procedure Laterality Date  . AUGMENTATION MAMMAPLASTY Bilateral   . BREAST ENHANCEMENT SURGERY    . BREAST REDUCTION SURGERY    . COLONOSCOPY  01/19/2016  . DILATION AND CURETTAGE OF UTERUS N/A 06/24/2015   Procedure: DILATATION AND CURETTAGE;  Surgeon: Emily Filbert, MD;  Location: [facility];  Service: Gynecology;  Laterality: N/A;  . KNEE ARTHROSCOPY WITH SUBCHONDROPLASTY Right 05/03/2018   Procedure: RIGHT KNEE ARTHROSCOPY WITH DEBRIDEMENT, PARTIAL MEDIAL MENISCECTOMY SUBCHONDROPLASTY MEDIAL TIBIAL PLATEAU AND MEDIAL FEMORAL CONDYLE;  Surgeon: Mcarthur Rossetti, MD;  Location: [facility];  Service: Orthopedics;  Laterality: Right;  . REDUCTION MAMMAPLASTY Bilateral   . TOE SURGERY     removal of bone in each foot  . TUBAL LIGATION     Social History   Occupational History  . Occupation: Housekeeping- [employer]  Tobacco Use  . Smoking status: Former Smoker    Years: 7.00    Types: Cigars    Last attempt to quit: 08/11/2008    Years since quitting: 10.3  . Smokeless tobacco: Never Used  .  Tobacco comment: smoked Black&Milds, 1 pack per day (5 in a pack)  Substance and Sexual Activity  . Alcohol use: Yes    Alcohol/week: 5.0 standard drinks    Types: 5 Cans of beer per week    Comment: occ   . Drug use: Never  . Sexual activity: Not Currently    Partners: Male    Birth control/protection: Surgical

## 2018-12-02 ENCOUNTER — Emergency Department (HOSPITAL_BASED_OUTPATIENT_CLINIC_OR_DEPARTMENT_OTHER): Payer: 59

## 2018-12-02 ENCOUNTER — Encounter (HOSPITAL_BASED_OUTPATIENT_CLINIC_OR_DEPARTMENT_OTHER): Payer: Self-pay | Admitting: Emergency Medicine

## 2018-12-02 ENCOUNTER — Other Ambulatory Visit: Payer: Self-pay

## 2018-12-02 ENCOUNTER — Emergency Department (HOSPITAL_BASED_OUTPATIENT_CLINIC_OR_DEPARTMENT_OTHER)
Admission: EM | Admit: 2018-12-02 | Discharge: 2018-12-02 | Disposition: A | Discharge status: Home / Self Care | Payer: 59 | Attending: Emergency Medicine | Admitting: Emergency Medicine

## 2018-12-02 DIAGNOSIS — Z7984 Long term (current) use of oral hypoglycemic drugs: Secondary | ICD-10-CM | POA: Insufficient documentation

## 2018-12-02 DIAGNOSIS — Z87891 Personal history of nicotine dependence: Secondary | ICD-10-CM | POA: Insufficient documentation

## 2018-12-02 DIAGNOSIS — I10 Essential (primary) hypertension: Secondary | ICD-10-CM | POA: Diagnosis not present

## 2018-12-02 DIAGNOSIS — R51 Headache: Secondary | ICD-10-CM | POA: Diagnosis not present

## 2018-12-02 DIAGNOSIS — Z79899 Other long term (current) drug therapy: Secondary | ICD-10-CM | POA: Diagnosis not present

## 2018-12-02 DIAGNOSIS — E039 Hypothyroidism, unspecified: Secondary | ICD-10-CM | POA: Insufficient documentation

## 2018-12-02 DIAGNOSIS — R519 Headache, unspecified: Secondary | ICD-10-CM

## 2018-12-02 LAB — CBC WITH DIFFERENTIAL/PLATELET
Abs Immature Granulocytes: 0.01 10*3/uL (ref 0.00–0.07)
Basophils Absolute: 0 10*3/uL (ref 0.0–0.1)
Basophils Relative: 0 %
Eosinophils Absolute: 0 10*3/uL (ref 0.0–0.5)
Eosinophils Relative: 0 %
HCT: 40.6 % (ref 36.0–46.0)
Hemoglobin: 12.8 g/dL (ref 12.0–15.0)
Immature Granulocytes: 0 %
Lymphocytes Relative: 33 %
Lymphs Abs: 1.7 10*3/uL (ref 0.7–4.0)
MCH: 27.2 pg (ref 26.0–34.0)
MCHC: 31.5 g/dL (ref 30.0–36.0)
MCV: 86.4 fL (ref 80.0–100.0)
Monocytes Absolute: 0.3 10*3/uL (ref 0.1–1.0)
Monocytes Relative: 6 %
Neutro Abs: 3.1 10*3/uL (ref 1.7–7.7)
Neutrophils Relative %: 61 %
Platelets: 136 10*3/uL — ABNORMAL LOW (ref 150–400)
RBC: 4.7 MIL/uL (ref 3.87–5.11)
RDW: 14.3 % (ref 11.5–15.5)
WBC: 5.1 10*3/uL (ref 4.0–10.5)
nRBC: 0 % (ref 0.0–0.2)

## 2018-12-02 LAB — BASIC METABOLIC PANEL
Anion gap: 8 (ref 5–15)
BUN: 18 mg/dL (ref 6–20)
CO2: 21 mmol/L — ABNORMAL LOW (ref 22–32)
Calcium: 8.8 mg/dL — ABNORMAL LOW (ref 8.9–10.3)
Chloride: 106 mmol/L (ref 98–111)
Creatinine, Ser: 0.86 mg/dL (ref 0.44–1.00)
GFR calc Af Amer: >60 mL/min (ref >60)
GFR calc non Af Amer: >60 mL/min (ref >60)
Glucose, Bld: 96 mg/dL (ref 70–99)
Potassium: 4 mmol/L (ref 3.5–5.1)
Sodium: 135 mmol/L (ref 135–145)

## 2018-12-02 LAB — URINALYSIS, ROUTINE W REFLEX MICROSCOPIC
Bilirubin Urine: NEGATIVE
Glucose, UA: NEGATIVE mg/dL
Hgb urine dipstick: NEGATIVE
Ketones, ur: NEGATIVE mg/dL
Leukocytes,Ua: NEGATIVE
Nitrite: NEGATIVE
Protein, ur: NEGATIVE mg/dL
Specific Gravity, Urine: 1.025 (ref 1.005–1.030)
pH: 5.5 (ref 5.0–8.0)

## 2018-12-02 MED ORDER — SODIUM CHLORIDE 0.9 % IV BOLUS
1000.0000 mL | Freq: Once | INTRAVENOUS | Status: AC
  Administered 2018-12-02: 1000 mL via INTRAVENOUS

## 2018-12-02 MED ORDER — KETOROLAC TROMETHAMINE 15 MG/ML IJ SOLN
15.0000 mg | Freq: Once | INTRAMUSCULAR | Status: AC
  Administered 2018-12-02: 16:00:00 15 mg via INTRAVENOUS
  Filled 2018-12-02: qty 1

## 2018-12-02 MED ORDER — DIPHENHYDRAMINE HCL 50 MG/ML IJ SOLN
25.0000 mg | Freq: Once | INTRAMUSCULAR | Status: AC
  Administered 2018-12-02: 25 mg via INTRAVENOUS
  Filled 2018-12-02: qty 1

## 2018-12-02 MED ORDER — PROCHLORPERAZINE EDISYLATE 10 MG/2ML IJ SOLN
10.0000 mg | Freq: Once | INTRAMUSCULAR | Status: AC
  Administered 2018-12-02: 10 mg via INTRAVENOUS
  Filled 2018-12-02: qty 2

## 2018-12-02 NOTE — ED Notes (Signed)
ED Provider at bedside. 

## 2018-12-02 NOTE — ED Notes (Signed)
Pt given drink with verbal approval from PA

## 2018-12-02 NOTE — ED Provider Notes (Signed)
[facility] EMERGENCY DEPARTMENT Provider Note   CSN: [id number] Arrival date & time: 12/02/18  1348    History   Chief Complaint Chief Complaint  Patient presents with  . Headache    HPI Michelle Huerta is a 58 y.o. female who presents to the ED complaining of gradual onset, constant, waxing and waning, diffuse throbbing headache x 3-4 days. Pt has been taking OTC medication without relief. She does not have a hx of headaches and states this is a new problem for her. Pt has hx of HTN but has been complaint on her medications. BP in the ED 136/98. Denies fever, chills, neck stiffness, rash, visual disturbances, unilateral weakness or numbness, speech difficulties, confusion, or any other associated symptoms.        Past Medical History:  Diagnosis Date  . Allergy   . Arthritis of both knees   . Bilateral chronic knee pain   . Chronic back pain   . Constipation   . DDD (degenerative disc disease), cervical   . Depression    no meds  . Genital herpes    HSV type 2 at rectum, culture positive 3/15  . Glaucoma   . Grade I diastolic dysfunction 38/04/1750   noted on ECHO   . Heart murmur   . History of colon polyps   . HLD (hyperlipidemia)   . Hypertension   . Hypothyroidism   . Insomnia   . Internal hemorrhoids   . Lactose intolerance   . Lupus ([place])   . LVH (left ventricular hypertrophy) 06/25/2015   Mild, noted on ECHO   . Mild sleep apnea    1/17 CPAP Dr Claiborne Billings does not use cpap   . Morbid obesity ([place]) 06/11/2015  . Osteoporosis   . Paget's disease   . Paget's disease of bone    Dr Trudie Reed, Reclast insufion 02/14/10 - bad reaction to infusion, now tolerates fosamax weekly  . Plantar fasciitis, bilateral   . PMB (postmenopausal bleeding)   . Pre-diabetes   . Sleep apnea   . Thrombocytopenia ([place])   . TR (tricuspid regurgitation) 06/25/2015   Trace, noted on ECHO   . Tubular adenoma polyp of rectum    8/12, Dr Fuller Plan, 5 yr follow up   . Uterine fibroid  10/19/2017   noted on CT pelvis  . Vitamin D deficiency     Patient Active Problem List   Diagnosis Date Noted  . Vitamin D deficiency 07/26/2018  . Class 3 severe obesity with serious comorbidity and body mass index (BMI) of 45.0 to 49.9 in adult ([place]) 07/26/2018  . Status post arthroscopic surgery of right knee 05/09/2018  . Chronic pain of right knee 05/09/2018  . Closed fracture of right tibial plateau   . Unilateral primary osteoarthritis, right knee 04/23/2018  . Acute medial meniscus tear of right knee 04/23/2018  . Closed fracture of medial plateau of left tibia 04/23/2018  . Severe obesity (BMI >= 40) ([place]) 04/23/2018  . Osteoarthritis of right knee 03/09/2018  . Prediabetes 03/09/2018  . Bilateral knee pain 12/28/2017  . Constipation 12/18/2017  . Cough 12/15/2017  . Lower resp. tract infection 12/15/2017  . Acute conjunctivitis of right eye 12/15/2017  . H/O Paget's disease of bone 09/10/2017  . OSA on CPAP 10/28/2015  . Fibroids 08/07/2015  . Plantar fasciitis, bilateral 08/03/2015  . Metatarsal deformity 08/03/2015  . Equinus deformity of foot, acquired 08/03/2015  . Pronation deformity of both feet 08/03/2015  . Morbid obesity ([place])  06/11/2015  . 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 Essential (primary) hypertension 06/11/2015  . 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 Benign essential HTN 09/19/2013  . Major depressive episode 09/19/2013    Past Surgical History:  Procedure Laterality Date  . AUGMENTATION MAMMAPLASTY Bilateral   . BREAST ENHANCEMENT SURGERY    . BREAST REDUCTION SURGERY    . COLONOSCOPY  01/19/2016  . DILATION AND CURETTAGE OF UTERUS N/A 06/24/2015   Procedure: DILATATION AND CURETTAGE;  Surgeon: Emily Filbert, MD;  Location: [facility];  Service: Gynecology;  Laterality: N/A;  . KNEE ARTHROSCOPY WITH SUBCHONDROPLASTY Right 05/03/2018   Procedure: RIGHT KNEE ARTHROSCOPY WITH DEBRIDEMENT, PARTIAL MEDIAL MENISCECTOMY SUBCHONDROPLASTY MEDIAL  TIBIAL PLATEAU AND MEDIAL FEMORAL CONDYLE;  Surgeon: Mcarthur Rossetti, MD;  Location: [facility];  Service: Orthopedics;  Laterality: Right;  . REDUCTION MAMMAPLASTY Bilateral   . TOE SURGERY     removal of bone in each foot  . TUBAL LIGATION       OB History    Gravida  5   Para      Term      Preterm      AB      Living  2     SAB      TAB      Ectopic      Multiple      Live Births               Home Medications    Prior to Admission medications   Medication Sig Start Date End Date Taking? Authorizing Provider  amLODipine (NORVASC) 5 MG tablet Take 1 tablet (5 mg total) by mouth daily. 04/26/18   McVey, Gelene Mink, PA-C  cetirizine (ZYRTEC) 10 MG tablet Take 10 mg by mouth daily.    [provider]  Estradiol 10 MCG INST Place 10 mcg vaginally 2 (two) times a week. Patient not taking: Reported on 09/17/2018 07/19/18   Wendie Agreste, MD  ibuprofen (ADVIL,MOTRIN) 800 MG tablet Take 1 tablet (800 mg total) by mouth every 8 (eight) hours as needed. 05/04/18   Mcarthur Rossetti, MD  MELATONIN PO Take by mouth.    [provider]  metFORMIN (GLUCOPHAGE) 500 MG tablet Take 1 tablet (500 mg total) by mouth 2 (two) times daily with a meal. 11/20/18   Jearld Lesch A, DO  misoprostol (CYTOTEC) 200 MCG tablet Cytotec 200 mcg tablet  insert one tablet per vagina at bedtime night prior to procedure    [provider]  telmisartan (MICARDIS) 80 MG tablet Take 1 tablet (80 mg total) by mouth daily. 04/26/18   McVey, Gelene Mink, PA-C  temazepam (RESTORIL) 30 MG capsule  09/17/18   [provider]  valACYclovir (VALTREX) 500 MG tablet Take 1 tablet (500 mg total) by mouth daily. 04/26/18   McVey, Gelene Mink, PA-C  Vitamin D, Ergocalciferol, (DRISDOL) 1.25 MG (50000 UT) CAPS capsule Take 1 capsule (50,000 Units total) by mouth every 7 (seven) days. 09/04/18   Whitmire, Joneen Boers, FNP    Family History Family History   Problem Relation Age of Onset  . Hypertension Mother   . Heart disease Mother   . Hypertension Father   . Hypertension Sister   . Cancer Maternal Grandmother   . Stomach cancer Paternal Grandmother   . Hypertension Son   . Hypertension Son   . Colon cancer Neg Hx     Social History Social History   Tobacco Use  .  Smoking status: Former Smoker    Years: 7.00    Types: Cigars    Last attempt to quit: 08/11/2008    Years since quitting: 10.3  . Smokeless tobacco: Never Used  . Tobacco comment: smoked [employer], 1 pack per day (5 in a pack)  Substance Use Topics  . Alcohol use: Yes    Alcohol/week: 5.0 standard drinks    Types: 5 Cans of beer per week    Comment: occ   . Drug use: Never     Allergies   Penicillins and Reclast [zoledronic acid]   Review of Systems Review of Systems  Constitutional: Negative for chills and fever.  HENT: Negative for congestion.   Eyes: Negative for visual disturbance.  Respiratory: Negative for cough and shortness of breath.   Cardiovascular: Negative for chest pain.  Gastrointestinal: Negative for abdominal pain, constipation, diarrhea, nausea and vomiting.  Genitourinary: Negative for dysuria.  Musculoskeletal: Negative for myalgias, neck pain and neck stiffness.  Skin: Negative for rash.  Neurological: Positive for headaches. Negative for dizziness, syncope, speech difficulty, weakness, light-headedness and numbness.  Psychiatric/Behavioral: Negative for confusion.     Physical Exam Updated Vital Signs BP (!) 136/98 (BP Location: Right Arm)   Pulse 96   Temp 98.6 F (37 C) (Oral)   Resp 18   Ht 5\' 7"  (1.702 m)   Wt 132 kg   LMP 08/01/2012   SpO2 99%   BMI 45.58 kg/m   Physical Exam Vitals signs and nursing note reviewed.  Constitutional:      Appearance: She is not ill-appearing.  HENT:     Head: Normocephalic and atraumatic.     Right Ear: Tympanic membrane normal.     Left Ear: Tympanic membrane normal.  Eyes:      Conjunctiva/sclera: Conjunctivae normal.  Neck:     Musculoskeletal: Neck supple.     Meningeal: Brudzinski's sign and Kernig's sign absent.  Cardiovascular:     Rate and Rhythm: Normal rate and regular rhythm.  Pulmonary:     Effort: Pulmonary effort is normal.     Breath sounds: Normal breath sounds.  Abdominal:     Palpations: Abdomen is soft.     Tenderness: There is no abdominal tenderness.  Lymphadenopathy:     Cervical: No cervical adenopathy.  Skin:    General: Skin is warm and dry.  Neurological:     Mental Status: She is alert.     GCS: GCS eye subscore is 4. GCS verbal subscore is 5. GCS motor subscore is 6.     Deep Tendon Reflexes: Babinski sign absent on the right side. Babinski sign absent on the left side.     Comments: CN 2-12 grossly intact A&O x4 GCS 15 Sensation and strength intact Gait nonataxic including with tandem walking Coordination with finger-to-nose WNL Neg romberg, neg pronator drift       ED Treatments / Results  Labs (all labs ordered are listed, but only abnormal results are displayed) Labs Reviewed  BASIC METABOLIC PANEL - Abnormal; Notable for the following components:      Result Value   CO2 21 (*)    Calcium 8.8 (*)    All other components within normal limits  CBC WITH DIFFERENTIAL/PLATELET - Abnormal; Notable for the following components:   Platelets 136 (*)    All other components within normal limits  URINALYSIS, ROUTINE W REFLEX MICROSCOPIC    EKG None  Radiology Ct Head Wo Contrast  Result Date: 12/02/2018 CLINICAL DATA:  Headache for 3 days EXAM: CT HEAD WITHOUT CONTRAST TECHNIQUE: Contiguous axial images were obtained from the base of the skull through the vertex without intravenous contrast. COMPARISON:  May 01, 2013 FINDINGS: Brain: Ventricles are normal in size and configuration. There is no intracranial mass, hemorrhage, extra-axial fluid collection, or midline shift. The brain parenchyma appears  unremarkable. No acute infarct evident. Vascular: There is no hyperdense vessel. There is no appreciable vascular calcification. Skull: The bony calvarium appears intact. Sinuses/Orbits: Paranasal sinuses are clear. Orbits appear symmetric bilaterally. Other: Mastoid air cells are clear. There is debris in each external auditory canal. IMPRESSION: Probable cerumen in each external auditory canal. Study otherwise unremarkable. Electronically Signed   By: Lowella Grip III M.D.   On: 12/02/2018 14:46    Procedures Procedures (including critical care time)  Medications Ordered in ED Medications  ketorolac (TORADOL) 15 MG/ML injection 15 mg (15 mg Intravenous Given 12/02/18 1543)  prochlorperazine (COMPAZINE) injection 10 mg (10 mg Intravenous Given 12/02/18 1548)  diphenhydrAMINE (BENADRYL) injection 25 mg (25 mg Intravenous Given 12/02/18 1545)  sodium chloride 0.9 % bolus 1,000 mL (1,000 mLs Intravenous New Bag/Given 12/02/18 1551)     Initial Impression / Assessment and Plan / ED Course  I have reviewed the triage vital signs and the nursing notes.  Pertinent labs & imaging results that were available during my care of the patient were reviewed by me and considered in my medical decision making (see chart for details).    Pt is a 58 year old female who presents with 3 days of diffuse headache. No improvement with Tylenol at home. No fever or neck stiffness. No rash. Pt afebrile in the ED and no URI complaints; no maxillary or frontal pressure on exam. No other concerning symptoms but pt does not have hx of headaches. Given new nature and pt age will proceed with CT Head at this time to rule out any intracranial abnormalities; neuro exam very reassuring today with no focal neuro deficits. Low suspicion for SAH vs mass vs other intracranial process.  Will hold off on headache cocktail at this time prior to CT results as do not want to increase risk of bleed with NSAIDs. Baseline labs also drawn  including CBC, BMP, and U/A.   CT Head without acute abnormality; will proceed with headache cocktail including toradol, compazine, and benadryl with 1 L NS bolus. Still awaiting labs at this point.   Nursing staff informed that pt is requesting coke to drink. Will happily give this to her. Labwork reassuring; no leukocytosis to suggest infection. Electrolytes within normal limits and no signs of infection on urinalysis. Will let fluids and meds run and reevaluate afterwards. Pt does have low platelet level but per chart review this appears to be an ongoing issue for her.   Upon reevaluation pt laying comfortably in bed; reports she is no longer having the same throbbing sensation she came in with; pt expresses she is ready to go home; will have patient follow up with PCP regarding ED visit; strict return precautions discussed with her as well; she is in agreement with plan and stable for discharge home.        Final Clinical Impressions(s) / ED Diagnoses   Final diagnoses:  Acute nonintractable headache, unspecified headache type    ED Discharge Orders    None       Eustaquio Maize, PA-C 12/02/18 1702    Blanchie Dessert, MD 12/03/18 2024

## 2018-12-02 NOTE — ED Triage Notes (Signed)
Intermittent headache x 3 days. No relief with OTC meds.

## 2018-12-02 NOTE — Discharge Instructions (Signed)
You were seen in the ED today for a headache; the CT scan of your head was negative today; your labwork was also reassuring. Your headache improved after your received medication and IV fluids during your stay. Please follow up with your PCP regarding your ED visit today.   Return to the ED for any worsening symptoms including headache unable to be resolved with OTC medications; vomiting; vision changes; weakness/numbness on one side of your body; speech difficulties; neck stiffness; fever > 100.4.

## 2018-12-04 ENCOUNTER — Ambulatory Visit (INDEPENDENT_AMBULATORY_CARE_PROVIDER_SITE_OTHER): Payer: 59 | Admitting: Bariatrics

## 2018-12-04 ENCOUNTER — Encounter (INDEPENDENT_AMBULATORY_CARE_PROVIDER_SITE_OTHER): Payer: Self-pay | Admitting: Bariatrics

## 2018-12-04 ENCOUNTER — Telehealth: Payer: Self-pay | Admitting: Family Medicine

## 2018-12-04 ENCOUNTER — Other Ambulatory Visit: Payer: Self-pay

## 2018-12-04 DIAGNOSIS — I10 Essential (primary) hypertension: Secondary | ICD-10-CM

## 2018-12-04 DIAGNOSIS — R7303 Prediabetes: Secondary | ICD-10-CM | POA: Diagnosis not present

## 2018-12-04 DIAGNOSIS — Z6841 Body Mass Index (BMI) 40.0 and over, adult: Secondary | ICD-10-CM

## 2018-12-04 NOTE — Telephone Encounter (Signed)
I advised her. She said she will call HR.

## 2018-12-04 NOTE — Telephone Encounter (Signed)
Pt states her employer will not let her work 5 hrs a day, and that she will not be able to work. Please call pt @ [phone]

## 2018-12-04 NOTE — Telephone Encounter (Signed)
Pt called asking to speak to Terri.  Pt didn't tell me what she needed other than she needed to speak with her.

## 2018-12-04 NOTE — Telephone Encounter (Signed)
She should either talk to HR about her situation, or else try to find a job elsewhere that will allow her to sit.

## 2018-12-05 ENCOUNTER — Encounter: Payer: Self-pay | Admitting: Family Medicine

## 2018-12-05 ENCOUNTER — Telehealth (INDEPENDENT_AMBULATORY_CARE_PROVIDER_SITE_OTHER): Payer: 59 | Admitting: Family Medicine

## 2018-12-05 VITALS — Ht 67.0 in | Wt 289.0 lb

## 2018-12-05 DIAGNOSIS — J0191 Acute recurrent sinusitis, unspecified: Secondary | ICD-10-CM | POA: Diagnosis not present

## 2018-12-05 MED ORDER — FLUTICASONE PROPIONATE 50 MCG/ACT NA SUSP: 2.0000 | Freq: Every day | NASAL | 6 refills | Status: DC

## 2018-12-05 MED ORDER — DOXYCYCLINE HYCLATE 100 MG PO TABS: 100.0000 mg | ORAL_TABLET | Freq: Two times a day (BID) | ORAL | 0 refills | Status: DC

## 2018-12-05 MED FILL — DOXYCYCLINE HYCLATE 100 MG: 100 | 10 days supply | Qty: 20 | Fill #0

## 2018-12-05 MED FILL — FLUTICASONE PROP 50 MCG SPR: 50 | 30 days supply | Qty: 16 | Fill #0

## 2018-12-05 NOTE — Progress Notes (Signed)
Pt c/o coughing worse at night coughing up [place] mucus for about 2-3 days. Having some anxiety due to being out of work since Oct/2019. Also having some insomnia seems like she cant stay asleep for about two weeks.

## 2018-12-05 NOTE — Telephone Encounter (Signed)
Patient aware note at front desk  

## 2018-12-05 NOTE — Telephone Encounter (Signed)
Dr. Junius Roads gave the patient a return to work note with limitations of 5 hours per day with sitting as needed. The patient has spoken to HR and they cannot accommodate this - she must work 8 hours or not come back to work. The next step for the patient is knee replacement with Dr. Ninfa Linden. She must lose weight though, as her BMI at her visit this month was 45.76. She knows she must work on the weight before she can have the surgery. 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 She is asking for an out of work note to last until she sees Dr. Ninfa Linden in 3-4 months (Dr. Junius Roads will not keep her out of work that long).  Please advise if his is ok and when she should schedule a visit to come in to see Dr. Ninfa Linden to discuss the surgery (needs a date for [facility]).   Please forward the message back to me and I will call and advise the patient.

## 2018-12-05 NOTE — Progress Notes (Signed)
Office: [phone]  /  Fax: [phone] TeleHealth Visit:  Michelle Huerta has verbally consented to this TeleHealth visit today. The patient is located at home, the provider is located at the [employer] [facility] office. The participants in this visit include the listed provider and patient and any and all parties involved. The visit was conducted today via telephone. Michelle Huerta was unable to use realtime audiovisual technology today and the telehealth visit was conducted via telephone ( 15 minutes ).   HPI:   Chief Complaint: OBESITY Michelle Huerta is here to discuss her progress with her obesity treatment plan. She is on the Category 3 plan and is following her eating plan approximately 50 to 60 % of the time. She states she is exercising 0 minutes 0 times per week. Shemica states that she has lost about 2 pounds (weight 288 lbs). She states that she has had some depression and she has an appointment with her PCP. She denies homicidal or suicidal ideations. We were unable to weigh the patient today for this TeleHealth visit. She feels as if she has lost weight since her last visit. She has lost 8 lbs since starting treatment with [place].  Pre-Diabetes Michelle Huerta has a diagnosis of prediabetes based on her elevated Hgb A1c and was informed this puts her at greater risk of developing diabetes. Her last A1c was at 5.9 Ninel is taking Glucophage (decreased appetite) and she continues to work on diet and exercise to decrease risk of diabetes. She denies nausea or hypoglycemia.  Hypertension Michelle Huerta is a 58 y.o. female with hypertension. She is taking Norvasc and Micardis. Michelle Huerta denies chest pain or shortness of breath on exertion. She is working weight loss to help control her blood pressure with the goal of decreasing her risk of heart attack and stroke. Michelle Huerta blood pressure is currently controlled.  ASSESSMENT AND PLAN:  Prediabetes  Essential hypertension  Class 3 severe obesity with serious  comorbidity and body mass index (BMI) of 45.0 to 49.9 in adult, unspecified obesity type [facility])  PLAN:  Pre-Diabetes Michelle Huerta will continue to work on weight loss, exercise, and decreasing simple carbohydrates in her diet to help decrease the risk of diabetes. We dicussed metformin including benefits and risks. She was informed that eating too many simple carbohydrates or too many calories at one sitting increases the likelihood of GI side effects. Michelle Huerta will continue her diabetes medications and follow up with [place] as directed to monitor her progress.  Hypertension We discussed sodium restriction, working on healthy weight loss, and a regular exercise program as the means to achieve improved blood pressure control. Michelle Huerta agreed with this plan and agreed to follow up as directed. We will continue to monitor her blood pressure as well as her progress with the above lifestyle modifications. She will continue her medications as prescribed and will watch for signs of hypotension as she continues her lifestyle modifications.  Obesity Michelle Huerta is currently in the action stage of change. As such, her goal is to continue with weight loss efforts She has agreed to follow the Category 3 plan Michelle Huerta will begin exercise for weight loss and overall health benefits. We discussed the following Behavioral Modification Strategies today: increase H2O intake, no skipping meals, keeping healthy foods in the home, increasing lean protein intake, decreasing simple carbohydrates, increasing vegetables, decrease eating out and work on meal planning and easy cooking plans Michelle Huerta will weigh herself at home until she returns to the office.  Michelle Huerta has  agreed to follow up with our clinic in 2 weeks. She was informed of the importance of frequent follow up visits to maximize her success with intensive lifestyle modifications for her multiple health conditions.  ALLERGIES: Allergies  Allergen Reactions  . Penicillins Rash and Other (See  Comments)    Has patient had a PCN reaction causing immediate rash, facial/tongue/throat swelling, SOB or lightheadedness with hypotension: Yes Has patient had a PCN reaction causing severe rash involving mucus membranes or skin necrosis: No Has patient had a PCN reaction that required hospitalization: No Has patient had a PCN reaction occurring within the last 10 years: No If all of the above answers are "NO", then may proceed with Cephalosporin use.   [name] Reclast [Zoledronic Acid] Nausea And Vomiting    MEDICATIONS: Current Outpatient Medications on File Prior to Visit  Medication Sig Dispense Refill  . amLODipine (NORVASC) 5 MG tablet Take 1 tablet (5 mg total) by mouth daily. 90 tablet 3  . cetirizine (ZYRTEC) 10 MG tablet Take 10 mg by mouth daily.    . Estradiol 10 MCG INST Place 10 mcg vaginally 2 (two) times a week. (Patient not taking: Reported on 09/17/2018) 12 each 1  . ibuprofen (ADVIL,MOTRIN) 800 MG tablet Take 1 tablet (800 mg total) by mouth every 8 (eight) hours as needed. 90 tablet 0  . MELATONIN PO Take by mouth.    . metFORMIN (GLUCOPHAGE) 500 MG tablet Take 1 tablet (500 mg total) by mouth 2 (two) times daily with a meal. 60 tablet 0  . misoprostol (CYTOTEC) 200 MCG tablet Cytotec 200 mcg tablet  insert one tablet per vagina at bedtime night prior to procedure    . telmisartan (MICARDIS) 80 MG tablet Take 1 tablet (80 mg total) by mouth daily. 90 tablet 3  . temazepam (RESTORIL) 30 MG capsule     . valACYclovir (VALTREX) 500 MG tablet Take 1 tablet (500 mg total) by mouth daily. 90 tablet 3  . Vitamin D, Ergocalciferol, (DRISDOL) 1.25 MG (50000 UT) CAPS capsule Take 1 capsule (50,000 Units total) by mouth every 7 (seven) days. 4 capsule 0   No current facility-administered medications on file prior to visit.     PAST MEDICAL HISTORY: Past Medical History:  Diagnosis Date  . Allergy   . Arthritis of both knees   . Bilateral chronic knee pain   . Chronic back pain    . Constipation   . DDD (degenerative disc disease), cervical   . Depression    no meds  . Genital herpes    HSV type 2 at rectum, culture positive 3/15  . Glaucoma   . Grade I diastolic dysfunction 29/47/6546   noted on ECHO   . Heart murmur   . History of colon polyps   . HLD (hyperlipidemia)   . Hypertension   . Hypothyroidism   . Insomnia   . Internal hemorrhoids   . Lactose intolerance   . Lupus ([place])   . LVH (left ventricular hypertrophy) 06/25/2015   Mild, noted on ECHO   . Mild sleep apnea    1/17 CPAP Dr Claiborne Billings does not use cpap   . Morbid obesity ([place]) 06/11/2015  . Osteoporosis   . Paget's disease   . Paget's disease of bone    Dr Trudie Reed, Reclast insufion 02/14/10 - bad reaction to infusion, now tolerates fosamax weekly  . Plantar fasciitis, bilateral   . PMB (postmenopausal bleeding)   . Pre-diabetes   . Sleep apnea   .  Thrombocytopenia ([place])   . TR (tricuspid regurgitation) 06/25/2015   Trace, noted on ECHO   . Tubular adenoma polyp of rectum    8/12, Dr Fuller Plan, 5 yr follow up   . Uterine fibroid 10/19/2017   noted on CT pelvis  . Vitamin D deficiency     PAST SURGICAL HISTORY: Past Surgical History:  Procedure Laterality Date  . AUGMENTATION MAMMAPLASTY Bilateral   . BREAST ENHANCEMENT SURGERY    . BREAST REDUCTION SURGERY    . COLONOSCOPY  01/19/2016  . DILATION AND CURETTAGE OF UTERUS N/A 06/24/2015   Procedure: DILATATION AND CURETTAGE;  Surgeon: Emily Filbert, MD;  Location: [facility];  Service: Gynecology;  Laterality: N/A;  . KNEE ARTHROSCOPY WITH SUBCHONDROPLASTY Right 05/03/2018   Procedure: RIGHT KNEE ARTHROSCOPY WITH DEBRIDEMENT, PARTIAL MEDIAL MENISCECTOMY SUBCHONDROPLASTY MEDIAL TIBIAL PLATEAU AND MEDIAL FEMORAL CONDYLE;  Surgeon: Mcarthur Rossetti, MD;  Location: [facility];  Service: Orthopedics;  Laterality: Right;  . REDUCTION MAMMAPLASTY Bilateral   . TOE SURGERY     removal of bone in each foot  . TUBAL LIGATION      SOCIAL HISTORY:  Social History   Tobacco Use  . Smoking status: Former Smoker    Years: 7.00    Types: Cigars    Last attempt to quit: 08/11/2008    Years since quitting: 10.3  . Smokeless tobacco: Never Used  . Tobacco comment: smoked Black&Milds, 1 pack per day (5 in a pack)  Substance Use Topics  . Alcohol use: Yes    Alcohol/week: 5.0 standard drinks    Types: 5 Cans of beer per week    Comment: occ   . Drug use: Never    FAMILY HISTORY: Family History  Problem Relation Age of Onset  . Hypertension Mother   . Heart disease Mother   . Hypertension Father   . Hypertension Sister   . Cancer Maternal Grandmother   . Stomach cancer Paternal Grandmother   . Hypertension Son   . Hypertension Son   . Colon cancer Neg Hx     ROS: Review of Systems  Constitutional: Positive for weight loss.  Respiratory: Negative for shortness of breath (on exertion).   Cardiovascular: Negative for chest pain.  Gastrointestinal: Negative for nausea.  Endo/Heme/Allergies:       Negative for hypoglycemia  Psychiatric/Behavioral: Positive for depression. Negative for suicidal ideas (Negative for homicidal ideas).    PHYSICAL EXAM: Pt in no acute distress  RECENT LABS AND TESTS: BMET    Component Value Date/Time   NA 135 12/02/2018 1500   NA 141 09/10/2018 1151   K 4.0 12/02/2018 1500   CL 106 12/02/2018 1500   CO2 21 (L) 12/02/2018 1500   GLUCOSE 96 12/02/2018 1500   BUN 18 12/02/2018 1500   BUN 14 09/10/2018 1151   CREATININE 0.86 12/02/2018 1500   CALCIUM 8.8 (L) 12/02/2018 1500   GFRNONAA >60 12/02/2018 1500   GFRAA >60 12/02/2018 1500   Lab Results  Component Value Date   HGBA1C 5.9 (A) 09/10/2018   HGBA1C 6.3 (H) 05/30/2018   HGBA1C 6.0 (A) 03/09/2018   Lab Results  Component Value Date   INSULIN 41.4 (H) 05/30/2018   CBC    Component Value Date/Time   WBC 5.1 12/02/2018 1500   RBC 4.70 12/02/2018 1500   HGB 12.8 12/02/2018 1500   HGB 11.6 05/30/2018 1116   HCT 40.6  12/02/2018 1500   HCT 37.0 05/30/2018 1116   PLT 136 (L) 12/02/2018 1500  PLT 173 05/30/2018 1116   MCV 86.4 12/02/2018 1500   MCV 81 05/30/2018 1116   MCH 27.2 12/02/2018 1500   MCHC 31.5 12/02/2018 1500   RDW 14.3 12/02/2018 1500   RDW 13.7 05/30/2018 1116   LYMPHSABS 1.7 12/02/2018 1500   LYMPHSABS 2.4 05/30/2018 1116   MONOABS 0.3 12/02/2018 1500   EOSABS 0.0 12/02/2018 1500   EOSABS 0.1 05/30/2018 1116   BASOSABS 0.0 12/02/2018 1500   BASOSABS 0.0 05/30/2018 1116   Iron/TIBC/Ferritin/ %Sat No results found for: IRON, TIBC, FERRITIN, IRONPCTSAT Lipid Panel     Component Value Date/Time   CHOL 134 05/30/2018 1116   TRIG 85 05/30/2018 1116   HDL 75 05/30/2018 1116   CHOLHDL 1.8 05/30/2018 1116   LDLCALC 42 05/30/2018 1116   Hepatic Function Panel     Component Value Date/Time   PROT 7.2 09/10/2018 1151   ALBUMIN 3.9 09/10/2018 1151   AST 45 (H) 09/10/2018 1151   ALT 54 (H) 09/10/2018 1151   ALKPHOS 91 09/10/2018 1151   BILITOT 0.4 09/10/2018 1151      Component Value Date/Time   TSH 1.280 05/30/2018 1116   TSH 1.220 04/26/2018 1647   TSH 1.738 04/06/2015 1520     Ref. Range 09/10/2018 11:51  Vitamin D, 25-Hydroxy Latest Ref Range: 30.0 - 100.0 ng/mL 42.3    I, Doreene Nest, am acting as Location manager for [employer]. Owens Shark, DO  I have reviewed the above documentation for accuracy and completeness, and I agree with the above. -Jearld Lesch, DO

## 2018-12-05 NOTE — Telephone Encounter (Signed)
Please see below.

## 2018-12-05 NOTE — Progress Notes (Signed)
Telemedicine Encounter- SOAP NOTE Established Patient  I discussed the limitations, risks, security and privacy concerns of performing an evaluation and management service by telephone and the availability of in person appointments. I also discussed with the patient that there may be a patient responsible charge related to this service. The patient expressed understanding and agreed to proceed.  This telephone encounter was conducted with the patient's  verbal consent via audio telecommunications: yes Patient was instructed to have this encounter in a suitably private space; and to only have persons present to whom they give permission to participate. In addition, patient identity was confirmed by use of name plus two identifiers (DOB and address).  I spent a total of 49min talking with the patient .  Cc: Pt c/o coughing worse at night coughing up Graciano mucus for about 2-3 days. Having some anxiety due to being out of work since Oct/2019. Also having some insomnia seems like she cant stay asleep for about two weeks.          Michelle Huerta is a 58 y.o. female established patient. Telephone visit today for sinus congestion-cough with thick [place] mucous for the last few days. No fever. Sinus pressure.  No s/t. No fever. No diarrhea  Insomnia-pt using melatonin and restoril for sleep. Pt states not working well.  Pt with new concern for anxiety. 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    Patient Active Problem List   Diagnosis Date Noted  . Vitamin D deficiency 07/26/2018  . Class 3 severe obesity with serious comorbidity and body mass index (BMI) of 45.0 to 49.9 in adult ([place]) 07/26/2018  . Status post arthroscopic surgery of right knee 05/09/2018  . Chronic pain of right knee 05/09/2018  . Closed fracture of right tibial plateau   . Unilateral primary osteoarthritis, right knee 04/23/2018  . Acute medial meniscus  tear of right knee 04/23/2018  . Closed fracture of medial plateau of left tibia 04/23/2018  . Severe obesity (BMI >= 40) ([place]) 04/23/2018  . Osteoarthritis of right knee 03/09/2018  . Prediabetes 03/09/2018  . Bilateral knee pain 12/28/2017  . Constipation 12/18/2017  . Cough 12/15/2017  . Lower resp. tract infection 12/15/2017  . Acute conjunctivitis of right eye 12/15/2017  . H/O Paget's disease of bone 09/10/2017  . OSA on CPAP 10/28/2015  . Fibroids 08/07/2015  . Plantar fasciitis, bilateral 08/03/2015  . Metatarsal deformity 08/03/2015  . Equinus deformity of foot, acquired 08/03/2015  . Pronation deformity of both feet 08/03/2015  . Morbid obesity ([place]) 06/11/2015  . 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 Essential (primary) hypertension 06/11/2015  . 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 Benign essential HTN 09/19/2013  . Major depressive episode 09/19/2013    Past Medical History:  Diagnosis Date  . Allergy   . Arthritis of both knees   . Bilateral chronic knee pain   . Chronic back pain   . Constipation   . DDD (degenerative disc disease), cervical   . Depression    no meds  . Genital herpes    HSV type 2 at rectum, culture positive 3/15  . Glaucoma   . Grade I diastolic dysfunction 74/02/1447   noted on ECHO   . Heart murmur   . History of colon polyps   . HLD (hyperlipidemia)   . Hypertension   . Hypothyroidism   . Insomnia   . Internal hemorrhoids   . Lactose intolerance   .  Lupus ([place])   . LVH (left ventricular hypertrophy) 06/25/2015   Mild, noted on ECHO   . Mild sleep apnea    1/17 CPAP Dr Claiborne Billings does not use cpap   . Morbid obesity ([place]) 06/11/2015  . Osteoporosis   . Paget's disease   . Paget's disease of bone    Dr Trudie Reed, Reclast insufion 02/14/10 - bad reaction to infusion, now tolerates fosamax weekly  . Plantar fasciitis, bilateral   . PMB (postmenopausal bleeding)   . Pre-diabetes   . Sleep apnea   . Thrombocytopenia ([place])   . TR  (tricuspid regurgitation) 06/25/2015   Trace, noted on ECHO   . Tubular adenoma polyp of rectum    8/12, Dr Fuller Plan, 5 yr follow up   . Uterine fibroid 10/19/2017   noted on CT pelvis  . Vitamin D deficiency     Current Outpatient Medications  Medication Sig Dispense Refill  . amLODipine (NORVASC) 5 MG tablet Take 1 tablet (5 mg total) by mouth daily. 90 tablet 3  . cetirizine (ZYRTEC) 10 MG tablet Take 10 mg by mouth daily.    [name] ibuprofen (ADVIL,MOTRIN) 800 MG tablet Take 1 tablet (800 mg total) by mouth every 8 (eight) hours as needed. 90 tablet 0  . MELATONIN PO Take by mouth.    . metFORMIN (GLUCOPHAGE) 500 MG tablet Take 1 tablet (500 mg total) by mouth 2 (two) times daily with a meal. 60 tablet 0  . telmisartan (MICARDIS) 80 MG tablet Take 1 tablet (80 mg total) by mouth daily. 90 tablet 3  . temazepam (RESTORIL) 30 MG capsule     . valACYclovir (VALTREX) 500 MG tablet Take 1 tablet (500 mg total) by mouth daily. 90 tablet 3  . Vitamin D, Ergocalciferol, (DRISDOL) 1.25 MG (50000 UT) CAPS capsule Take 1 capsule (50,000 Units total) by mouth every 7 (seven) days. 4 capsule 0  . Estradiol 10 MCG INST Place 10 mcg vaginally 2 (two) times a week. (Patient not taking: Reported on 09/17/2018) 12 each 1  . misoprostol (CYTOTEC) 200 MCG tablet Cytotec 200 mcg tablet  insert one tablet per vagina at bedtime night prior to procedure     No current facility-administered medications for this visit.     Allergies  Allergen Reactions  . Penicillins Rash and Other (See Comments)    Has patient had a PCN reaction causing immediate rash, facial/tongue/throat swelling, SOB or lightheadedness with hypotension: Yes Has patient had a PCN reaction causing severe rash involving mucus membranes or skin necrosis: No Has patient had a PCN reaction that required hospitalization: No Has patient had a PCN reaction occurring within the last 10 years: No If all of the above answers are "NO", then may proceed  with Cephalosporin use.   [name] Reclast [Zoledronic Acid] Nausea And Vomiting    Social History   Socioeconomic History  . Marital status: Single    Spouse name: Not on file  . Number of children: Not on file  . Years of education: Not on file  . Highest education level: Not on file  Occupational History  . Occupation: Actuary- [employer]  Social Needs  . Financial resource strain: Not on file  . Food insecurity:    Worry: Not on file    Inability: Not on file  . Transportation needs:    Medical: Not on file    Non-medical: Not on file  Tobacco Use  . Smoking status: Former Smoker    Years: 7.00  Types: Cigars    Last attempt to quit: 08/11/2008    Years since quitting: 10.3  . Smokeless tobacco: Never Used  . Tobacco comment: smoked Black&Milds, 1 pack per day (5 in a pack)  Substance and Sexual Activity  . Alcohol use: Yes    Alcohol/week: 5.0 standard drinks    Types: 5 Cans of beer per week    Comment: occ   . Drug use: Never  . Sexual activity: Not Currently    Partners: Male    Birth control/protection: Surgical  Lifestyle  . Physical activity:    Days per week: Not on file    Minutes per session: Not on file  . Stress: Not on file  Relationships  . Social connections:    Talks on phone: Not on file    Gets together: Not on file    Attends religious service: Not on file    Active member of club or organization: Not on file    Attends meetings of clubs or organizations: Not on file    Relationship status: Not on file  . Intimate partner violence:    Fear of current or ex partner: Not on file    Emotionally abused: Not on file    Physically abused: Not on file    Forced sexual activity: Not on file  Other Topics Concern  . Not on file  Social History Narrative   Epworth sleepiness score as of 06/11/15 a 4   Exercise: yes, active at work, no formal exercise   Single, never married, in relationship off and on   Children: [place], [place] (both in [employer]),  2 grandkids.  Raised 2 nieces [name], [address] (they have 5 kids)   Occupation: [employer]   Religion: faith important, not active in a church   Seatbelt use: yes    Review of Systems  Constitutional: Positive for malaise/fatigue. Negative for chills and fever.  HENT: Positive for sinus pain and sore throat. Negative for ear pain.   Respiratory: Positive for cough and sputum production. Negative for shortness of breath and wheezing.   Cardiovascular: Negative for chest pain.  Psychiatric/Behavioral: The patient has insomnia.     Objective   Vitals as reported by the patient: Today's Vitals   12/05/18 0846  Weight: 289 lb (131.1 kg)  Height: 5\' 7"  (1.702 m)   1. Acute recurrent sinusitis, unspecified location Doxy-pt PEN allergic, mucinex suggested I discussed the assessment and treatment plan with the patient. The patient was provided an opportunity to ask questions and all were answered. The patient agreed with the plan and demonstrated an understanding of the instructions. If sinus symptoms do not improve, follow up for clinic appointment   The patient was advised to call back for in-person evaluation for depression/anxiety/insomnia related to extended out of work due to chronic health condition. Pt currently not on medication for anxiety and depression. Pt using medication for insomnia. Pt needs depression/anxiety assessment for new start medications I provided 15 minutes of non-face-to-face time during this encounter.  LISA Hannah Beat, MD  Primary Care at [facility] 12-05-18

## 2018-12-05 NOTE — Telephone Encounter (Signed)
We should write a note stating that we are recommending her to be out of work if her job cannot accommodate her from a work limitation standpoint.  We would still recommend her being able to work for half days (4 to 5 hours daily) with periods of time to be able to rest her knee.  If that cannot be met from a work accommodation standpoint, we would then need to keep her out of work while she still works on weight loss in order to eventually have knee replacement surgery.

## 2018-12-06 ENCOUNTER — Other Ambulatory Visit: Payer: Self-pay

## 2018-12-06 ENCOUNTER — Ambulatory Visit: Payer: 59 | Admitting: Family Medicine

## 2018-12-06 ENCOUNTER — Encounter: Payer: Self-pay | Admitting: Family Medicine

## 2018-12-06 VITALS — BP 93/58 | HR 105 | Temp 99.3°F | Ht 67.0 in | Wt 285.0 lb

## 2018-12-06 DIAGNOSIS — I959 Hypotension, unspecified: Secondary | ICD-10-CM | POA: Insufficient documentation

## 2018-12-06 DIAGNOSIS — R5383 Other fatigue: Secondary | ICD-10-CM

## 2018-12-06 DIAGNOSIS — R630 Anorexia: Secondary | ICD-10-CM | POA: Insufficient documentation

## 2018-12-06 DIAGNOSIS — I10 Essential (primary) hypertension: Secondary | ICD-10-CM

## 2018-12-06 DIAGNOSIS — R110 Nausea: Secondary | ICD-10-CM | POA: Insufficient documentation

## 2018-12-06 DIAGNOSIS — I9589 Other hypotension: Secondary | ICD-10-CM | POA: Diagnosis not present

## 2018-12-06 DIAGNOSIS — G4709 Other insomnia: Secondary | ICD-10-CM | POA: Diagnosis not present

## 2018-12-06 MED ORDER — TRAZODONE HCL 50 MG PO TABS: 50.0000 mg | ORAL_TABLET | Freq: Every day | ORAL | 3 refills | Status: DC

## 2018-12-06 MED FILL — traZODone HCL 50 MG TABS: 50 | 30 days supply | Qty: 30 | Fill #0

## 2018-12-06 NOTE — Patient Instructions (Addendum)
Hold blood pressure medication until seen in clinic tomorrow Hold metformin until normal diet resumes  Take Trazodone tonight for sleep and depression Take doxy for sinus infection Increase fluids -NO caffeine Follow up appt tomorrow to recheck bp/pulse and review labwork

## 2018-12-06 NOTE — Progress Notes (Signed)
Acute Office Visit  Subjective:    Patient ID: Michelle Huerta, female    DOB: 14-Jan-1961, 58 y.o.   MRN: [id number]  Chief Complaint  Patient presents with  . Fatigue    Since wed of last week   . Headache    feeling bad   . Anorexia  . Depression    [id number]    HPI  Pt with right knee pain with orthoscopic procedure then injected gel with no improvement in pain fall 2019. Pt states replacement needed but pt needs to loose weight to qualify for replacement. Pt seeing bariatric specialist for evaluation and treatment. NO medications-diet , no exercise program. Pt states she can not return to work due to knee pain-works in housekeeping at [employer]  Pt with headaches and fatigue due to insomnia over the past 1 week.  Insomnia in the past with use of Restoril long term-pt states it no longer works. Pt states she has been treated for depression in the past but stopped medications due to large number of meds. Pt states no SI. Lives with son, another son stays when in [place] in [place]. Pt states headache improved with fluids given in ER 4 days ago-CT negative, labs -thrombocytopenia   Pt with sinus pressure over the past week. Virtual visit yesterday with doxy started-pt feels medication has helped drainage and sinus pressue- 3 doses taken   Weakness over the last week. Pt states IV fluids with pain medication given to resolve headaches in ER this week. Pt states fatigue overwhelming. Pt states she went to the grocery store and post office with overwhelming fatigue prior to this appt.  Pt states the headaches improved with fluids and meds in ER and sinus pressure has improved with antibiotics.   Pt with no food intake today.lack of appetite is a new concern. Taking metformin daily for bariatrics-weight loss for knee surgery. Yesterday pt only able to eat 1 chicken tender and 1/2sandwich. Pt with nausea but no vomiting. Pt with watery bowels- NO blood.   Insomnia-long term medication-CPAP  machine. Pt unable to sleep with machine. Keeps pt awake. Pt states she no longer uses her CPAP. Pt with long term use of Restoril  Past Medical History:  Diagnosis Date  . Allergy   . Arthritis of both knees   . Bilateral chronic knee pain   . Chronic back pain   . Constipation   . DDD (degenerative disc disease), cervical   . Depression    no meds  . Genital herpes    HSV type 2 at rectum, culture positive 3/15  . Glaucoma   . Grade I diastolic dysfunction 70/35/0093   noted on ECHO   . Heart murmur   . History of colon polyps   . HLD (hyperlipidemia)   . Hypertension   . Hypothyroidism   . Insomnia   . Internal hemorrhoids   . Lactose intolerance   . Lupus ([place])   . LVH (left ventricular hypertrophy) 06/25/2015   Mild, noted on ECHO   . Mild sleep apnea    1/17 CPAP Dr Claiborne Billings does not use cpap   . Morbid obesity ([place]) 06/11/2015  . Osteoporosis   . Paget's disease   . Paget's disease of bone    Dr Trudie Reed, Reclast insufion 02/14/10 - bad reaction to infusion, now tolerates fosamax weekly  . Plantar fasciitis, bilateral   . PMB (postmenopausal bleeding)   . Pre-diabetes   . Sleep apnea   . Thrombocytopenia ([place])   .  TR (tricuspid regurgitation) 06/25/2015   Trace, noted on ECHO   . Tubular adenoma polyp of rectum    8/12, Dr Fuller Plan, 5 yr follow up   . Uterine fibroid 10/19/2017   noted on CT pelvis  . Vitamin D deficiency     Past Surgical History:  Procedure Laterality Date  . AUGMENTATION MAMMAPLASTY Bilateral   . BREAST ENHANCEMENT SURGERY    . BREAST REDUCTION SURGERY    . COLONOSCOPY  01/19/2016  . DILATION AND CURETTAGE OF UTERUS N/A 06/24/2015   Procedure: DILATATION AND CURETTAGE;  Surgeon: Emily Filbert, MD;  Location: [facility];  Service: Gynecology;  Laterality: N/A;  . KNEE ARTHROSCOPY WITH SUBCHONDROPLASTY Right 05/03/2018   Procedure: RIGHT KNEE ARTHROSCOPY WITH DEBRIDEMENT, PARTIAL MEDIAL MENISCECTOMY SUBCHONDROPLASTY MEDIAL TIBIAL PLATEAU AND MEDIAL  FEMORAL CONDYLE;  Surgeon: Mcarthur Rossetti, MD;  Location: [facility];  Service: Orthopedics;  Laterality: Right;  . REDUCTION MAMMAPLASTY Bilateral   . TOE SURGERY     removal of bone in each foot  . TUBAL LIGATION      Family History  Problem Relation Age of Onset  . Hypertension Mother   . Heart disease Mother   . Hypertension Father   . Hypertension Sister   . Cancer Maternal Grandmother   . Stomach cancer Paternal Grandmother   . Hypertension Son   . Hypertension Son   . Colon cancer Neg Hx     Social History   Socioeconomic History  . Marital status: Single    Spouse name: Not on file  . Number of children: Not on file  . Years of education: Not on file  . Highest education level: Not on file  Occupational History  . Occupation: Actuary- [employer]  Social Needs  . Financial resource strain: Not on file  . Food insecurity:    Worry: Not on file    Inability: Not on file  . Transportation needs:    Medical: Not on file    Non-medical: Not on file  Tobacco Use  . Smoking status: Former Smoker    Years: 7.00    Types: Cigars    Last attempt to quit: 08/11/2008    Years since quitting: 10.3  . Smokeless tobacco: Never Used  . Tobacco comment: smoked [employer], 1 pack per day (5 in a pack)  Substance and Sexual Activity  . Alcohol use: Yes    Alcohol/week: 5.0 standard drinks    Types: 5 Cans of beer per week    Comment: occ   . Drug use: Never  . Sexual activity: Not Currently    Partners: Male    Birth control/protection: Surgical  Lifestyle  . Physical activity:    Days per week: Not on file    Minutes per session: Not on file  . Stress: Not on file  Relationships  . Social connections:    Talks on phone: Not on file    Gets together: Not on file    Attends religious service: Not on file    Active member of club or organization: Not on file    Attends meetings of clubs or organizations: Not on file    Relationship status: Not on file  .  Intimate partner violence:    Fear of current or ex partner: Not on file    Emotionally abused: Not on file    Physically abused: Not on file    Forced sexual activity: Not on file  Other Topics Concern  . Not on file  Social History Narrative   Epworth sleepiness score as of 06/11/15 a 4   Exercise: yes, active at work, no formal exercise   Single, never married, in relationship off and on   Children: [place], [place] (both in [place]), 2 grandkids.  Raised 2 nieces [name], [address] (they have 5 kids)   Occupation: [employer]   Religion: faith important, not active in a church   Seatbelt use: yes    Outpatient Medications Prior to Visit  Medication Sig Dispense Refill  . amLODipine (NORVASC) 5 MG tablet Take 1 tablet (5 mg total) by mouth daily. 90 tablet 3  . cetirizine (ZYRTEC) 10 MG tablet Take 10 mg by mouth daily.    [name] doxycycline (VIBRA-TABS) 100 MG tablet Take 1 tablet (100 mg total) by mouth 2 (two) times daily. 20 tablet 0  . fluticasone (FLONASE) 50 MCG/ACT nasal spray Place 2 sprays into both nostrils daily. 16 g 6  . ibuprofen (ADVIL,MOTRIN) 800 MG tablet Take 1 tablet (800 mg total) by mouth every 8 (eight) hours as needed. 90 tablet 0  . MELATONIN PO Take by mouth.    . metFORMIN (GLUCOPHAGE) 500 MG tablet Take 1 tablet (500 mg total) by mouth 2 (two) times daily with a meal. 60 tablet 0  . temazepam (RESTORIL) 30 MG capsule     . valACYclovir (VALTREX) 500 MG tablet Take 1 tablet (500 mg total) by mouth daily. 90 tablet 3  . Vitamin D, Ergocalciferol, (DRISDOL) 1.25 MG (50000 UT) CAPS capsule Take 1 capsule (50,000 Units total) by mouth every 7 (seven) days. 4 capsule 0  . Estradiol 10 MCG INST Place 10 mcg vaginally 2 (two) times a week. (Patient not taking: Reported on 09/17/2018) 12 each 1  . misoprostol (CYTOTEC) 200 MCG tablet Cytotec 200 mcg tablet  insert one tablet per vagina at bedtime night prior to procedure    . telmisartan (MICARDIS)  80 MG tablet Take 1 tablet (80 mg total) by mouth daily. (Patient not taking: Reported on 12/06/2018) 90 tablet 3   No facility-administered medications prior to visit.     Allergies  Allergen Reactions  . Penicillins Rash and Other (See Comments)    Has patient had a PCN reaction causing immediate rash, facial/tongue/throat swelling, SOB or lightheadedness with hypotension: Yes Has patient had a PCN reaction causing severe rash involving mucus membranes or skin necrosis: No Has patient had a PCN reaction that required hospitalization: No Has patient had a PCN reaction occurring within the last 10 years: No If all of the above answers are "NO", then may proceed with Cephalosporin use.   [name] Reclast [Zoledronic Acid] Nausea And Vomiting    Review of Systems  Constitutional: Positive for malaise/fatigue. Negative for chills, fever and weight loss.  Respiratory: Positive for cough and sputum production. Negative for shortness of breath and wheezing.   Gastrointestinal: Positive for diarrhea and nausea. Negative for blood in stool, constipation, melena and vomiting.  Genitourinary: Negative for dysuria and hematuria.  Musculoskeletal: Negative for falls.  Skin: Negative for itching and rash.  Neurological: Positive for weakness. Negative for speech change.  Psychiatric/Behavioral: Positive for depression. The patient has insomnia.    NO SI/HI    Objective:    Physical Exam  Constitutional: She appears well-developed and well-nourished. She appears distressed.  HENT:  Head: Normocephalic and atraumatic.  Nose: Nose normal.  Mouth/Throat: Oropharyngeal exudate present.  Eyes: Conjunctivae are normal.  Neck: Normal range of motion. Neck supple. No thyromegaly  present.  Cardiovascular: Normal rate, regular rhythm and normal heart sounds.  Pulmonary/Chest: Effort normal and breath sounds normal.  Musculoskeletal:        General: No tenderness or edema.  Lymphadenopathy:    She has no  cervical adenopathy.  Neurological: She is alert.  pt drowsy-answered questions appropriately but closed eyes between questions to rest and answers took extended time. Pt leaned against wall to support-able to walk without assistance BP (!) 93/58 (BP Location: Right Arm, Patient Position: Sitting, Cuff Size: Large)   Pulse (!) 105   Temp 99.3 F (37.4 C) (Oral)   Ht 5\' 7"  (1.702 m)   Wt 285 lb (129.3 kg)   LMP 08/01/2012   SpO2 94%   BMI 44.64 kg/m  Wt Readings from Last 3 Encounters:  12/06/18 285 lb (129.3 kg)  12/05/18 289 lb (131.1 kg)  12/02/18 291 lb (132 kg)    Lab Results  Component Value Date   TSH 1.280 05/30/2018   Lab Results  Component Value Date   WBC 5.1 12/02/2018   HGB 12.8 12/02/2018   HCT 40.6 12/02/2018   MCV 86.4 12/02/2018   PLT 136 (L) 12/02/2018   Lab Results  Component Value Date   NA 135 12/02/2018   K 4.0 12/02/2018   CO2 21 (L) 12/02/2018   GLUCOSE 96 12/02/2018   BUN 18 12/02/2018   CREATININE 0.86 12/02/2018   BILITOT 0.4 09/10/2018   ALKPHOS 91 09/10/2018   AST 45 (H) 09/10/2018   ALT 54 (H) 09/10/2018   PROT 7.2 09/10/2018   ALBUMIN 3.9 09/10/2018   CALCIUM 8.8 (L) 12/02/2018   ANIONGAP 8 12/02/2018   Lab Results  Component Value Date   CHOL 134 05/30/2018   Lab Results  Component Value Date   HDL 75 05/30/2018   Lab Results  Component Value Date   LDLCALC 42 05/30/2018   Lab Results  Component Value Date   TRIG 85 05/30/2018   Lab Results  Component Value Date   CHOLHDL 1.8 05/30/2018   Lab Results  Component Value Date   HGBA1C 5.9 (A) 09/10/2018       Assessment & Plan:  1. Essential hypertension STOP meds until seen by Dr. Nyoka Cowden in clinic tomorrow- - CBC with Differential - Comprehensive metabolic panel  2. Fatigue, unspecified type  - TSH-unable to draw blood   3. Other specified hypotension Stop bp meds until re-eval in clinic tomorrow -non caffeine fluids  4. Nausea in adult Stop metformin  until return to regular diet  5. Anorexia Cbc,tsh,cmp-bland diet encouraged  6. Other insomnia Trazodone-rx-risk/benefit/side effects-concern for sleeping medication -pt to contact sleep specialist for assistance with CPAP  Pt will attempt oral rehydration-unable to draw blood due to dehydration-fluids given in ER 4 days ago  1 hour spent with pt obtaining history, reviewing chart and examination  LISA Hannah Beat, MD

## 2018-12-07 ENCOUNTER — Ambulatory Visit: Payer: 59 | Admitting: Family Medicine

## 2018-12-07 ENCOUNTER — Other Ambulatory Visit: Payer: Self-pay

## 2018-12-07 VITALS — BP 125/88 | HR 104 | Temp 99.2°F | Resp 18 | Ht 67.0 in | Wt 285.4 lb

## 2018-12-07 DIAGNOSIS — F4321 Adjustment disorder with depressed mood: Secondary | ICD-10-CM

## 2018-12-07 DIAGNOSIS — I959 Hypotension, unspecified: Secondary | ICD-10-CM | POA: Diagnosis not present

## 2018-12-07 DIAGNOSIS — E869 Volume depletion, unspecified: Secondary | ICD-10-CM | POA: Diagnosis not present

## 2018-12-07 DIAGNOSIS — R5383 Other fatigue: Secondary | ICD-10-CM | POA: Diagnosis not present

## 2018-12-07 NOTE — Progress Notes (Signed)
Subjective:    Patient ID: Michelle Huerta, female    DOB: March 06, 1961, 58 y.o.   MRN: [id number]  HPI Michelle Huerta is a 58 y.o. female Presents today for: Chief Complaint  Patient presents with   Fatigue    follow up said she need her thyriod check    Depression    score was a 15    Presents for evaluation of fatigue.  History of multiple problems including hypertension, diabetes, depression, prior fatigue, obesity.  PCP is Dr. Nolon Rod  She was seen yesterday by Dr. Holly Bodily, telemedicine visit on May 27.  ER visit May 24 for headache.  Head CT without acute abnormality time, given 1 L normal saline bolus and treated pain with Toradol, Compazine, Benadryl.  Improved in the ER.  Blood pressure was low yesterday at 93/58, with heart rate of 105.  Antihypertensives (telmisartan and amlodipine) were held and encouraged to increase non-caffeinated fluids.  Advised to hold metformin (500mg  BID) until nausea had resolved and tolerating regular diet.  Trazodone provided for sleep and advised to follow-up with [place] sleep specialist for CPAP issues. Plan for CBC, CMP, TSH yesterday but unable to draw blood.  Today feels better. Less woozy. No syncope/near-syncope.  Drinking more fluids yesterday. Ate some food last night - soup and crackers stayed down. Ate 1/2 sub - stayed down. Eating better.  No fever.  Feels about 60-70% better.   Depression screen: Depression screen [facility] 2/9 12/07/2018 12/06/2018 12/05/2018 09/10/2018 09/03/2018  Decreased Interest 3 3 0 0 0  Down, Depressed, Hopeless 3 3 0 0 0  PHQ - 2 Score 6 6 0 0 0  Altered sleeping 3 3 - - -  Tired, decreased energy 3 3 - - -  Change in appetite 0 3 - - -  Feeling bad or failure about yourself  0 0 - - -  Trouble concentrating 0 0 - - -  Moving slowly or fidgety/restless 3 3 - - -  Suicidal thoughts 0 0 - - -  PHQ-9 Score 15 18 - - -  Difficult doing work/chores - Somewhat difficult - - -  Some recent data might be hidden  down at  times being out of work.  Took trazodone last night. Went to sleep hr later, woke up few hrs later.  Has CPAP, but not using - plans to contact sleep specialist. No SI.             Patient Active Problem List   Diagnosis Date Noted   Fatigue 12/06/2018   Essential hypertension 12/06/2018   Arterial hypotension 12/06/2018   Nausea in adult 12/06/2018   Anorexia 12/06/2018   Other insomnia 12/06/2018   Acute recurrent sinusitis 12/05/2018   Vitamin D deficiency 07/26/2018   Class 3 severe obesity with serious comorbidity and body mass index (BMI) of 45.0 to 49.9 in adult [facility]) 07/26/2018   Status post arthroscopic surgery of right knee 05/09/2018   Chronic pain of right knee 05/09/2018   Closed fracture of right tibial plateau    Unilateral primary osteoarthritis, right knee 04/23/2018   Acute medial meniscus tear of right knee 04/23/2018   Closed fracture of medial plateau of left tibia 04/23/2018   Severe obesity (BMI >= 40) ([place]) 04/23/2018   Osteoarthritis of right knee 03/09/2018   Prediabetes 03/09/2018   Bilateral knee pain 12/28/2017   Constipation 12/18/2017   Cough 12/15/2017   Lower resp. 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 06/11/2015   Benign essential HTN 09/19/2013   Major depressive episode 09/19/2013   Past Medical History:  Diagnosis Date   Allergy    Arthritis of both knees    Bilateral chronic knee pain    Chronic back pain    Constipation    DDD (degenerative disc  disease), cervical    Depression    no meds   Genital herpes    HSV type 2 at rectum, culture positive 3/15   Glaucoma    Grade I diastolic dysfunction 65/78/4696   noted on ECHO    Heart murmur    History of colon polyps    HLD (hyperlipidemia)    Hypertension    Hypothyroidism    Insomnia    Internal hemorrhoids    Lactose intolerance    Lupus (HCC)    LVH (left ventricular hypertrophy) 06/25/2015   Mild, noted on ECHO    Mild sleep apnea    1/17 CPAP Dr Claiborne Billings does not use cpap    Morbid obesity ([place]) 06/11/2015   Osteoporosis    Paget's disease    Paget's disease of bone    Dr Trudie Reed, Reclast insufion 02/14/10 - bad reaction to infusion, now tolerates fosamax weekly   Plantar fasciitis, bilateral    PMB (postmenopausal bleeding)    Pre-diabetes    Sleep apnea    Thrombocytopenia (HCC)    TR (tricuspid regurgitation) 06/25/2015   Trace, noted on ECHO    Tubular adenoma polyp of rectum    8/12, Dr Fuller Plan, 5 yr follow up    Uterine fibroid 10/19/2017   noted on CT pelvis   Vitamin D deficiency    Past Surgical History:  Procedure Laterality Date   AUGMENTATION MAMMAPLASTY Bilateral    BREAST ENHANCEMENT SURGERY     BREAST REDUCTION SURGERY     COLONOSCOPY  01/19/2016   DILATION AND CURETTAGE OF UTERUS N/A 06/24/2015   Procedure: DILATATION AND CURETTAGE;  Surgeon: Emily Filbert, MD;  Location: [place] [facility];  Service: Gynecology;  Laterality: N/A;   KNEE ARTHROSCOPY WITH SUBCHONDROPLASTY Right 05/03/2018   Procedure: RIGHT KNEE ARTHROSCOPY WITH DEBRIDEMENT, PARTIAL MEDIAL MENISCECTOMY SUBCHONDROPLASTY MEDIAL TIBIAL PLATEAU AND MEDIAL FEMORAL CONDYLE;  Surgeon: Mcarthur Rossetti, MD;  Location: [facility];  Service: Orthopedics;  Laterality: Right;   REDUCTION MAMMAPLASTY Bilateral    TOE SURGERY     removal of bone in each foot   TUBAL LIGATION     Allergies  Allergen Reactions   Penicillins Rash and Other (See Comments)    Has  patient had a PCN reaction causing immediate rash, facial/tongue/throat swelling, SOB or lightheadedness with hypotension: Yes Has patient had a PCN reaction causing severe rash involving mucus membranes or skin necrosis: No Has patient had a PCN reaction that required hospitalization: No Has patient had a PCN reaction occurring within the last 10 years: No If all of the above answers are "NO", then may proceed with Cephalosporin use.    Reclast [Zoledronic Acid] Nausea And Vomiting   Prior to Admission medications   Medication Sig Start Date End Date  Taking? Authorizing Provider  amLODipine (NORVASC) 5 MG tablet Take 1 tablet (5 mg total) by mouth daily. 04/26/18  Yes McVey, Gelene Mink, PA-C  cetirizine (ZYRTEC) 10 MG tablet Take 10 mg by mouth daily.   Yes [provider]  doxycycline (VIBRA-TABS) 100 MG tablet Take 1 tablet (100 mg total) by mouth 2 (two) times daily. 12/05/18  Yes Corum, Rex Kras, MD  fluticasone (FLONASE) 50 MCG/ACT nasal spray Place 2 sprays into both nostrils daily. 12/05/18  Yes Corum, Rex Kras, MD  ibuprofen (ADVIL,MOTRIN) 800 MG tablet Take 1 tablet (800 mg total) by mouth every 8 (eight) hours as needed. 05/04/18  Yes Mcarthur Rossetti, MD  MELATONIN PO Take by mouth.   Yes [provider]  metFORMIN (GLUCOPHAGE) 500 MG tablet Take 1 tablet (500 mg total) by mouth 2 (two) times daily with a meal. 11/20/18  Yes Jearld Lesch A, DO  misoprostol (CYTOTEC) 200 MCG tablet Cytotec 200 mcg tablet  insert one tablet per vagina at bedtime night prior to procedure   Yes [provider]  temazepam (RESTORIL) 30 MG capsule  09/17/18  Yes [provider]  traZODone (DESYREL) 50 MG tablet Take 1 tablet (50 mg total) by mouth at bedtime. 12/06/18  Yes Corum, Rex Kras, MD  valACYclovir (VALTREX) 500 MG tablet Take 1 tablet (500 mg total) by mouth daily. 04/26/18  Yes McVey, Gelene Mink, PA-C  Vitamin D, Ergocalciferol, (DRISDOL) 1.25 MG  (50000 UT) CAPS capsule Take 1 capsule (50,000 Units total) by mouth every 7 (seven) days. 09/04/18  Yes Whitmire, Dawn W, FNP  Estradiol 10 MCG INST Place 10 mcg vaginally 2 (two) times a week. Patient not taking: Reported on 09/17/2018 07/19/18   Wendie Agreste, MD  telmisartan (MICARDIS) 80 MG tablet Take 1 tablet (80 mg total) by mouth daily. Patient not taking: Reported on 12/07/2018 04/26/18   McVey, Gelene Mink, PA-C   Social History   Socioeconomic History   Marital status: Single    Spouse name: Not on file   Number of children: Not on file   Years of education: Not on file   Highest education level: Not on file  Occupational History   Occupation: Housekeeping- [employer]  Social Needs   Financial resource strain: Not on file   Food insecurity:    Worry: Not on file    Inability: Not on file   Transportation needs:    Medical: Not on file    Non-medical: Not on file  Tobacco Use   Smoking status: Former Smoker    Years: 7.00    Types: Cigars    Last attempt to quit: 08/11/2008    Years since quitting: 10.3   Smokeless tobacco: Never Used   Tobacco comment: smoked [employer], 1 pack per day (5 in a pack)  Substance and Sexual Activity   Alcohol use: Yes    Alcohol/week: 5.0 standard drinks    Types: 5 Cans of beer per week    Comment: occ    Drug use: Never   Sexual activity: Not Currently    Partners: Male    Birth control/protection: Surgical  Lifestyle   Physical activity:    Days per week: Not on file    Minutes per session: Not on file   Stress: Not on file  Relationships   Social connections:    Talks on phone: Not on file    Gets together: Not on file    Attends religious service: Not on file  Active member of club or organization: Not on file    Attends meetings of clubs or organizations: Not on file    Relationship status: Not on file   Intimate partner violence:    Fear of current or ex partner: Not on file    Emotionally  abused: Not on file    Physically abused: Not on file    Forced sexual activity: Not on file  Other Topics Concern   Not on file  Social History Narrative   Epworth sleepiness score as of 06/11/15 a 4   Exercise: yes, active at work, no formal exercise   Single, never married, in relationship off and on   Children: [place], [place] (both in [place]), 2 grandkids.  Raised 2 nieces [name], [address] (they have 5 kids)   Occupation: [employer] housekeeping   Religion: faith important, not active in a church   Seatbelt use: yes    Review of Systems  Constitutional: Positive for fatigue.  Respiratory: Negative for shortness of breath.   Cardiovascular: Negative for chest pain.  Gastrointestinal: Negative for vomiting.  Neurological: Negative for syncope.       Objective:   Physical Exam Vitals signs reviewed.  Constitutional:      Appearance: She is well-developed.  HENT:     Head: Normocephalic and atraumatic.  Eyes:     Conjunctiva/sclera: Conjunctivae normal.     Pupils: Pupils are equal, round, and reactive to light.  Neck:     Vascular: No carotid bruit.  Cardiovascular:     Rate and Rhythm: Normal rate and regular rhythm.     Heart sounds: Normal heart sounds.  Pulmonary:     Effort: Pulmonary effort is normal.     Breath sounds: Normal breath sounds.  Abdominal:     Palpations: Abdomen is soft. There is no pulsatile mass.     Tenderness: There is no abdominal tenderness.  Skin:    General: Skin is warm and dry.  Neurological:     Mental Status: She is alert and oriented to person, place, and time.  Psychiatric:        Behavior: Behavior normal.   nonfocal exam.  Vitals:   12/07/18 1033  BP: 125/88  Pulse: (!) 104  Resp: 18  Temp: 99.2 F (37.3 C)  TempSrc: Oral  SpO2: 100%  Weight: 285 lb 6.4 oz (129.5 kg)  Height: 5\' 7"  (1.702 m)      Assessment & Plan:   Michelle Huerta is a 59 y.o. female Fatigue, unspecified type - Plan: CBC,  Comprehensive metabolic panel, TSH Hypotension, unspecified hypotension type Volume depletion  -Improving, has tolerated resumption of diet and has been maintaining hydration with fluids.  Blood pressure improved.  Fatigue/dizziness improved.  -Check CBC, CMP, TSH, blood draw effective today  -Continue to hold antihypertensives with monitoring at home if possible, otherwise will recheck in 3 days to determine resumption of meds.  If blood pressure elevated at home, can initially start with just amlodipine.  -RTC/ER precautions if worse  Adjustment disorder with depressed mood  -Suspected adjustment disorder with situational stressors/pandemic with effect on job.  Denies suicidal ideation.  Handout given on adjustment disorder, check TSH, discuss further with primary care provider.    No orders of the defined types were placed in this encounter.  Patient Instructions    I am glad to hear you are feeling better today.  Continue to hold on blood pressure medication for now, make sure to drink plenty  of fluids.  If you are able to check your blood pressure this weekend and the readings are over 140/90, can start just amlodipine again.  Otherwise we will recheck on Monday and can discuss returning to blood pressure medicines at that time.  See information below on adjustment disorder.  Some of the depression symptoms may be due to the events going on right now and being out of work.  Trazodone prescribed yesterday may help but would like to discuss this further at follow-up.  Return to the clinic or go to the nearest emergency room if any of your symptoms worsen or new symptoms occur.   Adjustment Disorder, Adult Adjustment disorder is a group of symptoms that can develop after a stressful life event, such as the loss of a job or serious physical illness. The symptoms can affect how you feel, think, and act. They may interfere with your relationships. Adjustment disorder increases your risk of  suicide and substance abuse. If this disorder is not managed early, it can develop into a more serious condition, such as major depressive disorder or post-traumatic stress disorder. What are the causes? This condition happens when you have trouble recovering from or coping with a stressful life event. What increases the risk? You are more likely to develop this condition if:  You have had depression or anxiety.  You are being treated for a long-term (chronic) illness.  You are being treated for an illness that cannot be cured (terminal illness).  You have a family history of mental illness. What are the signs or symptoms? Symptoms of this condition include:  Extreme trouble doing daily tasks, such as going to work.  Sadness, depression, or crying spells.  Worrying a lot.  Loss of enjoyment.  Change in appetite or weight.  Feelings of loss or hopelessness.  Thoughts of suicide.  Anxiety, worry, or nervousness.  Trouble sleeping.  Avoiding family and friends.  Fighting or vandalism.  Complaining of feeling sick without being ill.  Feeling dazed or disconnected.  Nightmares.  Trouble sleeping.  Irritability.  Reckless driving.  Poor work Systems analyst.  Ignoring bills. Symptoms of this condition start within three months of the stressful event. They do not last more than six months, unless the stressful circumstances last longer. Normal grieving after the death of a loved one is not a symptom of this condition. How is this diagnosed? To diagnose this condition, your health care provider will ask about what has happened in your life and how it has affected you. He or she may also ask about your medical history and your use of medicines, alcohol, and other substances. Your health care provider may do a physical exam and order lab tests or other studies. You may be referred to a mental health specialist. How is this treated? Treatment options for this condition  include:  Counseling or talk therapy. Talk therapy is usually provided by mental health specialists.  Medicines. Certain medicines may help with depression, anxiety, and sleep.  Support groups. These offer emotional support, advice, and guidance. They are made up of people who have had similar experiences.  Observation and time. This is sometimes called "watchful waiting." In this treatment, health care providers monitor your health and behavior without other treatment. Adjustment disorder sometimes gets better on its own with time. Follow these instructions at home:  Take over-the-counter and prescription medicines only as told by your health care provider.  Keep all follow-up visits as told by your health care provider. This is important.  Contact a health care provider if:  Your symptoms do not improve in six months.  Your symptoms get worse. Get help right away if:  You have serious thoughts about hurting yourself or someone else. If you ever feel like you may hurt yourself or others, or have thoughts about taking your own life, get help right away. You can go to your nearest emergency department or call:  Your local emergency services (911 in the [place]  A suicide crisis helpline, such as the [facility] at 612-040-8520. This is open 24 hours a day. Summary  Adjustment disorder is a group of symptoms that can develop after a stressful life event, such as the loss of a job or serious physical illness. The symptoms can affect how you feel, think, and act. They may interfere with your relationships.  Symptoms of this condition start within three months of the stressful event. They do not last more than six months, unless the stressful circumstances last longer.  Treatment may include talk therapy, medicines, participation in a support group, or observation to see if symptoms improve.  Contact your health care provider if your symptoms get worse or do not  improve in six months.  If you ever feel like you may hurt yourself or others, or have thoughts about taking your own life, get help right away. This information is not intended to replace advice given to you by your health care provider. Make sure you discuss any questions you have with your health care provider. Document Released: 03/01/2006 Document Revised: 08/26/2016 Document Reviewed: 08/26/2016 Elsevier Interactive Patient Education  [employer].    If you have lab work done today you will be contacted with your lab results within the next 2 weeks.  If you have not heard from [place] then please contact us. The fastest way to get your results is to register for My Chart.   IF you received an x-ray today, you will receive an invoice from [facility]. Please contact [facility] at [phone] with questions or concerns regarding your invoice.   IF you received labwork today, you will receive an invoice from [facility]. Please contact [facility] at [phone] with questions or concerns regarding your invoice.   Our billing staff will not be able to assist you with questions regarding bills from these companies.  You will be contacted with the lab results as soon as they are available. The fastest way to get your results is to activate your My Chart account. Instructions are located on the last page of this paperwork. If you have not heard from [place] regarding the results in 2 weeks, please contact this office.      Signed,   Merri Ray, MD [facility].  12/08/18 9:00 AM

## 2018-12-07 NOTE — Patient Instructions (Addendum)
I am glad to hear you are feeling better today.  Continue to hold on blood pressure medication for now, make sure to drink plenty of fluids.  If you are able to check your blood pressure this weekend and the readings are over 140/90, can start just amlodipine again.  Otherwise we will recheck on Monday and can discuss returning to blood pressure medicines at that time.  See information below on adjustment disorder.  Some of the depression symptoms may be due to the events going on right now and being out of work.  Trazodone prescribed yesterday may help but would like to discuss this further at follow-up.  Return to the clinic or go to the nearest emergency room if any of your symptoms worsen or new symptoms occur.   Adjustment Disorder, Adult Adjustment disorder is a group of symptoms that can develop after a stressful life event, such as the loss of a job or serious physical illness. The symptoms can affect how you feel, think, and act. They may interfere with your relationships. Adjustment disorder increases your risk of suicide and substance abuse. If this disorder is not managed early, it can develop into a more serious condition, such as major depressive disorder or post-traumatic stress disorder. What are the causes? This condition happens when you have trouble recovering from or coping with a stressful life event. What increases the risk? You are more likely to develop this condition if:  You have had depression or anxiety.  You are being treated for a long-term (chronic) illness.  You are being treated for an illness that cannot be cured (terminal illness).  You have a family history of mental illness. What are the signs or symptoms? Symptoms of this condition include:  Extreme trouble doing daily tasks, such as going to work.  Sadness, depression, or crying spells.  Worrying a lot.  Loss of enjoyment.  Change in appetite or weight.  Feelings of loss or  hopelessness.  Thoughts of suicide.  Anxiety, worry, or nervousness.  Trouble sleeping.  Avoiding family and friends.  Fighting or vandalism.  Complaining of feeling sick without being ill.  Feeling dazed or disconnected.  Nightmares.  Trouble sleeping.  Irritability.  Reckless driving.  Poor work Systems analyst.  Ignoring bills. Symptoms of this condition start within three months of the stressful event. They do not last more than six months, unless the stressful circumstances last longer. Normal grieving after the death of a loved one is not a symptom of this condition. How is this diagnosed? To diagnose this condition, your health care provider will ask about what has happened in your life and how it has affected you. He or she may also ask about your medical history and your use of medicines, alcohol, and other substances. Your health care provider may do a physical exam and order lab tests or other studies. You may be referred to a mental health specialist. How is this treated? Treatment options for this condition include:  Counseling or talk therapy. Talk therapy is usually provided by mental health specialists.  Medicines. Certain medicines may help with depression, anxiety, and sleep.  Support groups. These offer emotional support, advice, and guidance. They are made up of people who have had similar experiences.  Observation and time. This is sometimes called "watchful waiting." In this treatment, health care providers monitor your health and behavior without other treatment. Adjustment disorder sometimes gets better on its own with time. Follow these instructions at home:  Take over-the-counter and prescription  medicines only as told by your health care provider.  Keep all follow-up visits as told by your health care provider. This is important. Contact a health care provider if:  Your symptoms do not improve in six months.  Your symptoms get worse. Get help  right away if:  You have serious thoughts about hurting yourself or someone else. If you ever feel like you may hurt yourself or others, or have thoughts about taking your own life, get help right away. You can go to your nearest emergency department or call:  Your local emergency services (911 in the [place]  A suicide crisis helpline, such as the [facility] at 309-744-7599. This is open 24 hours a day. Summary  Adjustment disorder is a group of symptoms that can develop after a stressful life event, such as the loss of a job or serious physical illness. The symptoms can affect how you feel, think, and act. They may interfere with your relationships.  Symptoms of this condition start within three months of the stressful event. They do not last more than six months, unless the stressful circumstances last longer.  Treatment may include talk therapy, medicines, participation in a support group, or observation to see if symptoms improve.  Contact your health care provider if your symptoms get worse or do not improve in six months.  If you ever feel like you may hurt yourself or others, or have thoughts about taking your own life, get help right away. This information is not intended to replace advice given to you by your health care provider. Make sure you discuss any questions you have with your health care provider. Document Released: 03/01/2006 Document Revised: 08/26/2016 Document Reviewed: 08/26/2016 Elsevier Interactive Patient Education  [employer].    If you have lab work done today you will be contacted with your lab results within the next 2 weeks.  If you have not heard from [place] then please contact us. The fastest way to get your results is to register for My Chart.   IF you received an x-ray today, you will receive an invoice from [facility]. Please contact [facility] at [phone] with questions or concerns regarding your  invoice.   IF you received labwork today, you will receive an invoice from [place]. Please contact [facility] at [phone] with questions or concerns regarding your invoice.   Our billing staff will not be able to assist you with questions regarding bills from these companies.  You will be contacted with the lab results as soon as they are available. The fastest way to get your results is to activate your My Chart account. Instructions are located on the last page of this paperwork. If you have not heard from [place] regarding the results in 2 weeks, please contact this office.

## 2018-12-08 ENCOUNTER — Encounter: Payer: Self-pay | Admitting: Family Medicine

## 2018-12-08 LAB — COMPREHENSIVE METABOLIC PANEL
ALT: 56 IU/L — ABNORMAL HIGH (ref 0–32)
AST: 50 IU/L — ABNORMAL HIGH (ref 0–40)
Albumin/Globulin Ratio: 1.1 — ABNORMAL LOW (ref 1.2–2.2)
Albumin: 3.9 g/dL (ref 3.8–4.9)
Alkaline Phosphatase: 65 IU/L (ref 39–117)
BUN/Creatinine Ratio: 16 (ref 9–23)
BUN: 15 mg/dL (ref 6–24)
Bilirubin Total: 0.6 mg/dL (ref 0.0–1.2)
CO2: 20 mmol/L (ref 20–29)
Calcium: 8.8 mg/dL (ref 8.7–10.2)
Chloride: 101 mmol/L (ref 96–106)
Creatinine, Ser: 0.92 mg/dL (ref 0.57–1.00)
GFR calc Af Amer: 80 mL/min/{1.73_m2} (ref >59)
GFR calc non Af Amer: 70 mL/min/{1.73_m2} (ref >59)
Globulin, Total: 3.6 g/dL (ref 1.5–4.5)
Glucose: 89 mg/dL (ref 65–99)
Potassium: 4.2 mmol/L (ref 3.5–5.2)
Sodium: 136 mmol/L (ref 134–144)
Total Protein: 7.5 g/dL (ref 6.0–8.5)

## 2018-12-08 LAB — CBC
Hematocrit: 37.5 % (ref 34.0–46.6)
Hemoglobin: 12.7 g/dL (ref 11.1–15.9)
MCH: 27.1 pg (ref 26.6–33.0)
MCHC: 33.9 g/dL (ref 31.5–35.7)
MCV: 80 fL (ref 79–97)
Platelets: 138 10*3/uL — ABNORMAL LOW (ref 150–450)
RBC: 4.68 x10E6/uL (ref 3.77–5.28)
RDW: 13.5 % (ref 11.7–15.4)
WBC: 5.8 10*3/uL (ref 3.4–10.8)

## 2018-12-08 LAB — TSH: TSH: 1.41 u[IU]/mL (ref 0.450–4.500)

## 2018-12-10 ENCOUNTER — Other Ambulatory Visit: Payer: Self-pay

## 2018-12-10 ENCOUNTER — Encounter: Payer: Self-pay | Admitting: Family Medicine

## 2018-12-10 ENCOUNTER — Ambulatory Visit (INDEPENDENT_AMBULATORY_CARE_PROVIDER_SITE_OTHER): Payer: 59 | Admitting: Family Medicine

## 2018-12-10 VITALS — BP 140/82 | HR 100 | Temp 97.4°F | Resp 18 | Ht 67.0 in | Wt 286.0 lb

## 2018-12-10 DIAGNOSIS — F4321 Adjustment disorder with depressed mood: Secondary | ICD-10-CM

## 2018-12-10 DIAGNOSIS — R0602 Shortness of breath: Secondary | ICD-10-CM | POA: Diagnosis not present

## 2018-12-10 DIAGNOSIS — I10 Essential (primary) hypertension: Secondary | ICD-10-CM | POA: Diagnosis not present

## 2018-12-10 MED ORDER — ALBUTEROL SULFATE HFA 108 (90 BASE) MCG/ACT IN AERS: 2.0000 | INHALATION_SPRAY | Freq: Four times a day (QID) | RESPIRATORY_TRACT | 0 refills | Status: DC | PRN

## 2018-12-10 MED FILL — ALBUTEROL SULFATE HFA 108 (: 108 (90 BAS | 25 days supply | Qty: 18 | Fill #0

## 2018-12-10 NOTE — Progress Notes (Signed)
Established Patient Office Visit  Subjective:  Patient ID: Michelle Huerta, female    DOB: [birth year]  Age: 58 y.o. MRN: [id number]  CC:  Chief Complaint  Patient presents with  . Hypertension  . Fatigue    follow up   . Depression    score was a 13     HPI Michelle Huerta presents for   Hypertension: Patient here for follow-up of elevated blood pressure. She is exercising and is adherent to low salt diet.  Blood pressure is well controlled at home. Cardiac symptoms dyspnea. Patient denies chest pain, chest pressure/discomfort, exertional chest pressure/discomfort, irregular heart beat, lower extremity edema, near-syncope and palpitations.  Cardiovascular risk factors: hypertension and obesity (BMI >= 30 kg/m2). Use of agents associated with hypertension: none. History of target organ damage: none. She had some hypotension due to volume depletion that resolved  BP Readings from Last 3 Encounters:  12/10/18 140/82  12/07/18 125/88  12/06/18 (!) 93/58   Depression She states that her mood is better since taking the trazodone is working She was on temazepam but was not sleeping well so that was discontinued   Depression screen [facility] 2/9 12/10/2018 12/07/2018 12/06/2018 12/05/2018 09/10/2018  Decreased Interest 3 3 3  0 0  Down, Depressed, Hopeless 3 3 3  0 0  PHQ - 2 Score 6 6 6  0 0  Altered sleeping 3 3 3  - -  Tired, decreased energy 3 3 3  - -  Change in appetite 0 0 3 - -  Feeling bad or failure about yourself  0 0 0 - -  Trouble concentrating 0 0 0 - -  Moving slowly or fidgety/restless 1 3 3  - -  Suicidal thoughts 0 0 0 - -  PHQ-9 Score 13 15 18  - -  Difficult doing work/chores - - Somewhat difficult - -  Some recent data might be hidden   Dysnpea She states that she feels like she has short of breath and it is harder to breath It sometimes feels like there is something in her throat She feels like she has to clear her throat but does not make her cough. It is not associated with  eating or reflux. She denies any pressure on her chest or needing to prop herself up to breath. She states that she is more overweight but feel like she was still short of breath and that she is not getting enough air. She is not a smoker. She used to smoke and quit 10 years ago. She was previously given an inhaler due to her shortness of breath Wt Readings from Last 3 Encounters:  12/10/18 286 lb (129.7 kg)  12/07/18 285 lb 6.4 oz (129.5 kg)  12/06/18 285 lb (129.3 kg)      Past Medical History:  Diagnosis Date  . Allergy   . Arthritis of both knees   . Bilateral chronic knee pain   . Chronic back pain   . Constipation   . DDD (degenerative disc disease), cervical   . Depression    no meds  . Genital herpes    HSV type 2 at rectum, culture positive 3/15  . Glaucoma   . Grade I diastolic dysfunction 40/98/1191   noted on ECHO   . Heart murmur   . History of colon polyps   . HLD (hyperlipidemia)   . Hypertension   . Hypothyroidism   . Insomnia   . Internal hemorrhoids   . Lactose intolerance   . Lupus ([place])   .  LVH (left ventricular hypertrophy) 06/25/2015   Mild, noted on ECHO   . Mild sleep apnea    1/17 CPAP Dr Claiborne Billings does not use cpap   . Morbid obesity ([place]) 06/11/2015  . Osteoporosis   . Paget's disease   . Paget's disease of bone    Dr Trudie Reed, Reclast insufion 02/14/10 - bad reaction to infusion, now tolerates fosamax weekly  . Plantar fasciitis, bilateral   . PMB (postmenopausal bleeding)   . Pre-diabetes   . Sleep apnea   . Thrombocytopenia ([place])   . TR (tricuspid regurgitation) 06/25/2015   Trace, noted on ECHO   . Tubular adenoma polyp of rectum    8/12, Dr Fuller Plan, 5 yr follow up   . Uterine fibroid 10/19/2017   noted on CT pelvis  . Vitamin D deficiency     Past Surgical History:  Procedure Laterality Date  . AUGMENTATION MAMMAPLASTY Bilateral   . BREAST ENHANCEMENT SURGERY    . BREAST REDUCTION SURGERY    . COLONOSCOPY  01/19/2016  . DILATION  AND CURETTAGE OF UTERUS N/A 06/24/2015   Procedure: DILATATION AND CURETTAGE;  Surgeon: Emily Filbert, MD;  Location: [facility];  Service: Gynecology;  Laterality: N/A;  . KNEE ARTHROSCOPY WITH SUBCHONDROPLASTY Right 05/03/2018   Procedure: RIGHT KNEE ARTHROSCOPY WITH DEBRIDEMENT, PARTIAL MEDIAL MENISCECTOMY SUBCHONDROPLASTY MEDIAL TIBIAL PLATEAU AND MEDIAL FEMORAL CONDYLE;  Surgeon: Mcarthur Rossetti, MD;  Location: [facility];  Service: Orthopedics;  Laterality: Right;  . REDUCTION MAMMAPLASTY Bilateral   . TOE SURGERY     removal of bone in each foot  . TUBAL LIGATION      Family History  Problem Relation Age of Onset  . Hypertension Mother   . Heart disease Mother   . Hypertension Father   . Hypertension Sister   . Cancer Maternal Grandmother   . Stomach cancer Paternal Grandmother   . Hypertension Son   . Hypertension Son   . Colon cancer Neg Hx     Social History   Socioeconomic History  . Marital status: Single    Spouse name: Not on file  . Number of children: Not on file  . Years of education: Not on file  . Highest education level: Not on file  Occupational History  . Occupation: Actuary- [employer]  Social Needs  . Financial resource strain: Not on file  . Food insecurity    Worry: Not on file    Inability: Not on file  . Transportation needs    Medical: Not on file    Non-medical: Not on file  Tobacco Use  . Smoking status: Former Smoker    Years: 7.00    Types: Cigars    Quit date: 08/11/2008    Years since quitting: 10.3  . Smokeless tobacco: Never Used  . Tobacco comment: smoked [employer], 1 pack per day (5 in a pack)  Substance and Sexual Activity  . Alcohol use: Yes    Alcohol/week: 5.0 standard drinks    Types: 5 Cans of beer per week    Comment: occ   . Drug use: Never  . Sexual activity: Not Currently    Partners: Male    Birth control/protection: Surgical  Lifestyle  . Physical activity    Days per week: Not on file    Minutes per session:  Not on file  . Stress: Not on file  Relationships  . Social Herbalist on phone: Not on file    Gets together: Not on  file    Attends religious service: Not on file    Active member of club or organization: Not on file    Attends meetings of clubs or organizations: Not on file    Relationship status: Not on file  . Intimate partner violence    Fear of current or ex partner: Not on file    Emotionally abused: Not on file    Physically abused: Not on file    Forced sexual activity: Not on file  Other Topics Concern  . Not on file  Social History Narrative   Epworth sleepiness score as of 06/11/15 a 4   Exercise: yes, active at work, no formal exercise   Single, never married, in relationship off and on   Children: [place], [place] (both in [place]), 2 grandkids.  Raised 2 nieces [name], [address] (they have 5 kids)   Occupation: [employer]   Religion: faith important, not active in a church   Seatbelt use: yes    Outpatient Medications Prior to Visit  Medication Sig Dispense Refill  . amLODipine (NORVASC) 5 MG tablet Take 1 tablet (5 mg total) by mouth daily. 90 tablet 3  . cetirizine (ZYRTEC) 10 MG tablet Take 10 mg by mouth daily.    [name] doxycycline (VIBRA-TABS) 100 MG tablet Take 1 tablet (100 mg total) by mouth 2 (two) times daily. 20 tablet 0  . fluticasone (FLONASE) 50 MCG/ACT nasal spray Place 2 sprays into both nostrils daily. 16 g 6  . ibuprofen (ADVIL,MOTRIN) 800 MG tablet Take 1 tablet (800 mg total) by mouth every 8 (eight) hours as needed. 90 tablet 0  . MELATONIN PO Take by mouth.    . metFORMIN (GLUCOPHAGE) 500 MG tablet Take 1 tablet (500 mg total) by mouth 2 (two) times daily with a meal. 60 tablet 0  . misoprostol (CYTOTEC) 200 MCG tablet Cytotec 200 mcg tablet  insert one tablet per vagina at bedtime night prior to procedure    . temazepam (RESTORIL) 30 MG capsule     . traZODone (DESYREL) 50 MG tablet Take 1 tablet (50 mg total)  by mouth at bedtime. 30 tablet 3  . valACYclovir (VALTREX) 500 MG tablet Take 1 tablet (500 mg total) by mouth daily. 90 tablet 3  . Vitamin D, Ergocalciferol, (DRISDOL) 1.25 MG (50000 UT) CAPS capsule Take 1 capsule (50,000 Units total) by mouth every 7 (seven) days. 4 capsule 0  . Estradiol 10 MCG INST Place 10 mcg vaginally 2 (two) times a week. (Patient not taking: Reported on 09/17/2018) 12 each 1  . telmisartan (MICARDIS) 80 MG tablet Take 1 tablet (80 mg total) by mouth daily. (Patient not taking: Reported on 12/07/2018) 90 tablet 3   No facility-administered medications prior to visit.     Allergies  Allergen Reactions  . Penicillins Rash and Other (See Comments)    Has patient had a PCN reaction causing immediate rash, facial/tongue/throat swelling, SOB or lightheadedness with hypotension: Yes Has patient had a PCN reaction causing severe rash involving mucus membranes or skin necrosis: No Has patient had a PCN reaction that required hospitalization: No Has patient had a PCN reaction occurring within the last 10 years: No If all of the above answers are "NO", then may proceed with Cephalosporin use.   [name] Reclast [Zoledronic Acid] Nausea And Vomiting    ROS Review of Systems Review of Systems  Constitutional: Negative for activity change, appetite change, chills and fever.  HENT: Negative for congestion, nosebleeds, trouble  swallowing and voice change.   Respiratory: see hpi  Gastrointestinal: Negative for diarrhea, nausea and vomiting.  Genitourinary: Negative for difficulty urinating, dysuria, flank pain and hematuria.  Musculoskeletal: Negative for back pain, joint swelling and neck pain.  Neurological: Negative for dizziness, speech difficulty, light-headedness and numbness.  See HPI. All other review of systems negative.     Objective:    Physical Exam  BP 140/82   Pulse 100   Temp (!) 97.4 F (36.3 C)   Resp 18   Ht 5\' 7"  (1.702 m)   Wt 286 lb (129.7 kg)   LMP  08/01/2012   SpO2 98%   BMI 44.79 kg/m  Wt Readings from Last 3 Encounters:  12/10/18 286 lb (129.7 kg)  12/07/18 285 lb 6.4 oz (129.5 kg)  12/06/18 285 lb (129.3 kg)   Physical Exam  Constitutional: Oriented to person, place, and time. Appears well-developed and well-nourished.  HENT:  Head: Normocephalic and atraumatic.  Eyes: Conjunctivae and EOM are normal.  Cardiovascular: Normal rate, regular rhythm, normal heart sounds and intact distal pulses.  No murmur heard. Pulmonary/Chest: Effort normal and breath sounds normal. No stridor. No respiratory distress. Has no wheezes.  Neurological: Is alert and oriented to person, place, and time.  Skin: Skin is warm. Capillary refill takes less than 2 seconds.  Psychiatric: Has a normal mood and affect. Behavior is normal. Judgment and thought content normal.    ECG- NSR, NO ST ELEVATION   There are no preventive care reminders to display for this patient.  There are no preventive care reminders to display for this patient.  Lab Results  Component Value Date   TSH 1.410 12/07/2018   Lab Results  Component Value Date   WBC 5.8 12/07/2018   HGB 12.7 12/07/2018   HCT 37.5 12/07/2018   MCV 80 12/07/2018   PLT 138 (L) 12/07/2018   Lab Results  Component Value Date   NA 136 12/07/2018   K 4.2 12/07/2018   CO2 20 12/07/2018   GLUCOSE 89 12/07/2018   BUN 15 12/07/2018   CREATININE 0.92 12/07/2018   BILITOT 0.6 12/07/2018   ALKPHOS 65 12/07/2018   AST 50 (H) 12/07/2018   ALT 56 (H) 12/07/2018   PROT 7.5 12/07/2018   ALBUMIN 3.9 12/07/2018   CALCIUM 8.8 12/07/2018   ANIONGAP 8 12/02/2018   Lab Results  Component Value Date   CHOL 134 05/30/2018   Lab Results  Component Value Date   HDL 75 05/30/2018   Lab Results  Component Value Date   LDLCALC 42 05/30/2018   Lab Results  Component Value Date   TRIG 85 05/30/2018   Lab Results  Component Value Date   CHOLHDL 1.8 05/30/2018   Lab Results  Component Value  Date   HGBA1C 5.9 (A) 09/10/2018      Assessment & Plan:   Problem List Items Addressed This Visit      Cardiovascular and Mediastinum   Essential hypertension - Primary Patient's blood pressure is at goal of 139/89 or less. Condition is stable. Continue current medications and treatment plan. I recommend that you exercise for 30-45 minutes 5 days a week. I also recommend a balanced diet with fruits and vegetables every day, lean meats, and little fried foods. The DASH diet (you can find this online) is a good example of this.     Other Visit Diagnoses    Shortness of breath    -  Advised ventolin  ecg reassuring  Relevant Orders   EKG 12-Lead (Completed)   Adjustment disorder with depressed mood    -  Advised counseling  Mood is improving      Meds ordered this encounter  Medications  . albuterol (VENTOLIN HFA) 108 (90 Base) MCG/ACT inhaler    Sig: Inhale 2 puffs into the lungs every 6 (six) hours as needed for wheezing or shortness of breath.    Dispense:  1 Inhaler    Refill:  0    Follow-up: Return in about 2 months (around 02/09/2019) for blood pressure follow up and discussion about bp med amlodipine.    Forrest Moron, MD

## 2018-12-10 NOTE — Patient Instructions (Addendum)
Your ECG is normal. Your heart rhythm is normal.  Blood Pressure Medication Resume the Telmisartan 80mg . Do not continue the amlodipine right now. Pick up a blood pressure monitor at the pharmacy and check your blood glucose.   In 2 months will reassess blood pressure and if it is well controlled will continue on Telmisartin only if it is not will resume amlodipine 5mg    If you have lab work done today you will be contacted with your lab results within the next 2 weeks.  If you have not heard from [place] then please contact us. The fastest way to get your results is to register for My Chart.   IF you received an x-ray today, you will receive an invoice from [facility]. Please contact [facility] at [phone] with questions or concerns regarding your invoice.   IF you received labwork today, you will receive an invoice from [place]. Please contact [facility] at [phone] with questions or concerns regarding your invoice.   Our billing staff will not be able to assist you with questions regarding bills from these companies.  You will be contacted with the lab results as soon as they are available. The fastest way to get your results is to activate your My Chart account. Instructions are located on the last page of this paperwork. If you have not heard from [place] regarding the results in 2 weeks, please contact this office.

## 2018-12-18 ENCOUNTER — Ambulatory Visit (INDEPENDENT_AMBULATORY_CARE_PROVIDER_SITE_OTHER): Payer: 59 | Admitting: Bariatrics

## 2018-12-20 ENCOUNTER — Ambulatory Visit (INDEPENDENT_AMBULATORY_CARE_PROVIDER_SITE_OTHER): Payer: 59 | Admitting: Physician Assistant

## 2018-12-20 ENCOUNTER — Encounter (INDEPENDENT_AMBULATORY_CARE_PROVIDER_SITE_OTHER): Payer: Self-pay | Admitting: Physician Assistant

## 2018-12-20 ENCOUNTER — Other Ambulatory Visit: Payer: Self-pay

## 2018-12-20 DIAGNOSIS — Z6841 Body Mass Index (BMI) 40.0 and over, adult: Secondary | ICD-10-CM

## 2018-12-20 DIAGNOSIS — R7303 Prediabetes: Secondary | ICD-10-CM

## 2018-12-24 NOTE — Progress Notes (Signed)
Office: [phone]  /  Fax: [phone] TeleHealth Visit:  Michelle Huerta has verbally consented to this TeleHealth visit today. The patient is located at home, the provider is located at the [employer] [facility] office. The participants in this visit include the listed provider and patient. [name] was unable to use realtime audiovisual technology today and the telehealth visit was conducted via telephone (25 minutes).   HPI:   Chief Complaint: OBESITY Michelle Huerta is here to discuss her progress with her obesity treatment plan. She is on the Category 3 plan and is following her eating plan approximately 60 % of the time. She states she is exercising 0 minutes 0 times per week. Michelle Huerta reports that she has been eating some fried chicken and french fries about once a week. She is not drinking enough water. She also notes that she has been using chocolate chips for her snacks, but they tend to trigger her.   We were unable to weigh the patient today for this TeleHealth visit. She feels as if she has lost 4 lbs since her last visit. She has lost 8-12 lbs since starting treatment with [place].  Pre-Diabetes Michelle Huerta has a diagnosis of pre-diabetes based on her elevated Hgb A1c and was informed this puts her at greater risk of developing diabetes. She is taking metformin currently and she denies nausea, vomiting, or diarrhea. She continues to work on diet and exercise to decrease risk of diabetes. She denies polyphagia or hypoglycemia.  ASSESSMENT AND PLAN:  Prediabetes  Class 3 severe obesity with serious comorbidity and body mass index (BMI) of 45.0 to 49.9 in adult, unspecified obesity type [facility])  PLAN:  Pre-Diabetes Michelle Huerta will continue to work on weight loss, exercise, and decreasing simple carbohydrates in her diet to help decrease the risk of diabetes. We dicussed metformin including benefits and risks. She was informed that eating too many simple carbohydrates or too many calories at one sitting  increases the likelihood of GI side effects. Millianna agrees to continue taking metformin, and she agrees to follow up with our clinic in 2 weeks as directed to monitor her progress.  Obesity Michelle Huerta is currently in the action stage of change. As such, her goal is to continue with weight loss efforts She has agreed to follow the Category 3 plan Michelle Huerta has been instructed to work up to a goal of 150 minutes of combined cardio and strengthening exercise per week for weight loss and overall health benefits. We discussed the following Behavioral Modification Strategies today: work on meal planning and easy cooking plans, increase H20 intake, and keeping healthy foods in the home   Michelle Huerta has agreed to follow up with our clinic in 2 weeks. She was informed of the importance of frequent follow up visits to maximize her success with intensive lifestyle modifications for her multiple health conditions.  I spent > than 50% of the 25 minute visit on counseling as documented in the note.   ALLERGIES: Allergies  Allergen Reactions  . Penicillins Rash and Other (See Comments)    Has patient had a PCN reaction causing immediate rash, facial/tongue/throat swelling, SOB or lightheadedness with hypotension: Yes Has patient had a PCN reaction causing severe rash involving mucus membranes or skin necrosis: No Has patient had a PCN reaction that required hospitalization: No Has patient had a PCN reaction occurring within the last 10 years: No If all of the above answers are "NO", then may proceed with Cephalosporin use.   [name] Reclast [Zoledronic Acid]  Nausea And Vomiting    MEDICATIONS: Current Outpatient Medications on File Prior to Visit  Medication Sig Dispense Refill  . albuterol (VENTOLIN HFA) 108 (90 Base) MCG/ACT inhaler Inhale 2 puffs into the lungs every 6 (six) hours as needed for wheezing or shortness of breath. 1 Inhaler 0  . amLODipine (NORVASC) 5 MG tablet Take 1 tablet (5 mg total) by mouth daily. 90  tablet 3  . cetirizine (ZYRTEC) 10 MG tablet Take 10 mg by mouth daily.    [name] doxycycline (VIBRA-TABS) 100 MG tablet Take 1 tablet (100 mg total) by mouth 2 (two) times daily. 20 tablet 0  . Estradiol 10 MCG INST Place 10 mcg vaginally 2 (two) times a week. (Patient not taking: Reported on 09/17/2018) 12 each 1  . fluticasone (FLONASE) 50 MCG/ACT nasal spray Place 2 sprays into both nostrils daily. 16 g 6  . ibuprofen (ADVIL,MOTRIN) 800 MG tablet Take 1 tablet (800 mg total) by mouth every 8 (eight) hours as needed. 90 tablet 0  . MELATONIN PO Take by mouth.    . metFORMIN (GLUCOPHAGE) 500 MG tablet Take 1 tablet (500 mg total) by mouth 2 (two) times daily with a meal. 60 tablet 0  . misoprostol (CYTOTEC) 200 MCG tablet Cytotec 200 mcg tablet  insert one tablet per vagina at bedtime night prior to procedure    . telmisartan (MICARDIS) 80 MG tablet Take 1 tablet (80 mg total) by mouth daily. (Patient not taking: Reported on 12/07/2018) 90 tablet 3  . temazepam (RESTORIL) 30 MG capsule     . traZODone (DESYREL) 50 MG tablet Take 1 tablet (50 mg total) by mouth at bedtime. 30 tablet 3  . valACYclovir (VALTREX) 500 MG tablet Take 1 tablet (500 mg total) by mouth daily. 90 tablet 3  . Vitamin D, Ergocalciferol, (DRISDOL) 1.25 MG (50000 UT) CAPS capsule Take 1 capsule (50,000 Units total) by mouth every 7 (seven) days. 4 capsule 0   No current facility-administered medications on file prior to visit.     PAST MEDICAL HISTORY: Past Medical History:  Diagnosis Date  . Allergy   . Arthritis of both knees   . Bilateral chronic knee pain   . Chronic back pain   . Constipation   . DDD (degenerative disc disease), cervical   . Depression    no meds  . Genital herpes    HSV type 2 at rectum, culture positive 3/15  . Glaucoma   . Grade I diastolic dysfunction 62/69/4854   noted on ECHO   . Heart murmur   . History of colon polyps   . HLD (hyperlipidemia)   . Hypertension   . Hypothyroidism   .  Insomnia   . Internal hemorrhoids   . Lactose intolerance   . Lupus ([place])   . LVH (left ventricular hypertrophy) 06/25/2015   Mild, noted on ECHO   . Mild sleep apnea    1/17 CPAP Dr Claiborne Billings does not use cpap   . Morbid obesity ([place]) 06/11/2015  . Osteoporosis   . Paget's disease   . Paget's disease of bone    Dr Trudie Reed, Reclast insufion 02/14/10 - bad reaction to infusion, now tolerates fosamax weekly  . Plantar fasciitis, bilateral   . PMB (postmenopausal bleeding)   . Pre-diabetes   . Sleep apnea   . Thrombocytopenia ([place])   . TR (tricuspid regurgitation) 06/25/2015   Trace, noted on ECHO   . Tubular adenoma polyp of rectum    8/12, Dr Fuller Plan,  5 yr follow up   . Uterine fibroid 10/19/2017   noted on CT pelvis  . Vitamin D deficiency     PAST SURGICAL HISTORY: Past Surgical History:  Procedure Laterality Date  . AUGMENTATION MAMMAPLASTY Bilateral   . BREAST ENHANCEMENT SURGERY    . BREAST REDUCTION SURGERY    . COLONOSCOPY  01/19/2016  . DILATION AND CURETTAGE OF UTERUS N/A 06/24/2015   Procedure: DILATATION AND CURETTAGE;  Surgeon: Emily Filbert, MD;  Location: [facility];  Service: Gynecology;  Laterality: N/A;  . KNEE ARTHROSCOPY WITH SUBCHONDROPLASTY Right 05/03/2018   Procedure: RIGHT KNEE ARTHROSCOPY WITH DEBRIDEMENT, PARTIAL MEDIAL MENISCECTOMY SUBCHONDROPLASTY MEDIAL TIBIAL PLATEAU AND MEDIAL FEMORAL CONDYLE;  Surgeon: Mcarthur Rossetti, MD;  Location: [facility];  Service: Orthopedics;  Laterality: Right;  . REDUCTION MAMMAPLASTY Bilateral   . TOE SURGERY     removal of bone in each foot  . TUBAL LIGATION      SOCIAL HISTORY: Social History   Tobacco Use  . Smoking status: Former Smoker    Years: 7.00    Types: Cigars    Quit date: 08/11/2008    Years since quitting: 10.3  . Smokeless tobacco: Never Used  . Tobacco comment: smoked Black&Milds, 1 pack per day (5 in a pack)  Substance Use Topics  . Alcohol use: Yes    Alcohol/week: 5.0 standard drinks    Types:  5 Cans of beer per week    Comment: occ   . Drug use: Never    FAMILY HISTORY: Family History  Problem Relation Age of Onset  . Hypertension Mother   . Heart disease Mother   . Hypertension Father   . Hypertension Sister   . Cancer Maternal Grandmother   . Stomach cancer Paternal Grandmother   . Hypertension Son   . Hypertension Son   . Colon cancer Neg Hx     ROS: Review of Systems  Constitutional: Positive for weight loss.  Gastrointestinal: Negative for diarrhea, nausea and vomiting.  Endo/Heme/Allergies:       Negative polyphagia Negative hypoglycemia    PHYSICAL EXAM: Pt in no acute distress  RECENT LABS AND TESTS: BMET    Component Value Date/Time   NA 136 12/07/2018 1153   K 4.2 12/07/2018 1153   CL 101 12/07/2018 1153   CO2 20 12/07/2018 1153   GLUCOSE 89 12/07/2018 1153   GLUCOSE 96 12/02/2018 1500   BUN 15 12/07/2018 1153   CREATININE 0.92 12/07/2018 1153   CALCIUM 8.8 12/07/2018 1153   GFRNONAA 70 12/07/2018 1153   GFRAA 80 12/07/2018 1153   Lab Results  Component Value Date   HGBA1C 5.9 (A) 09/10/2018   HGBA1C 6.3 (H) 05/30/2018   HGBA1C 6.0 (A) 03/09/2018   Lab Results  Component Value Date   INSULIN 41.4 (H) 05/30/2018   CBC    Component Value Date/Time   WBC 5.8 12/07/2018 1153   WBC 5.1 12/02/2018 1500   RBC 4.68 12/07/2018 1153   RBC 4.70 12/02/2018 1500   HGB 12.7 12/07/2018 1153   HCT 37.5 12/07/2018 1153   PLT 138 (L) 12/07/2018 1153   MCV 80 12/07/2018 1153   MCH 27.1 12/07/2018 1153   MCH 27.2 12/02/2018 1500   MCHC 33.9 12/07/2018 1153   MCHC 31.5 12/02/2018 1500   RDW 13.5 12/07/2018 1153   LYMPHSABS 1.7 12/02/2018 1500   LYMPHSABS 2.4 05/30/2018 1116   MONOABS 0.3 12/02/2018 1500   EOSABS 0.0 12/02/2018 1500   EOSABS 0.1 05/30/2018  1116   BASOSABS 0.0 12/02/2018 1500   BASOSABS 0.0 05/30/2018 1116   Iron/TIBC/Ferritin/ %Sat No results found for: IRON, TIBC, FERRITIN, IRONPCTSAT Lipid Panel     Component  Value Date/Time   CHOL 134 05/30/2018 1116   TRIG 85 05/30/2018 1116   HDL 75 05/30/2018 1116   CHOLHDL 1.8 05/30/2018 1116   LDLCALC 42 05/30/2018 1116   Hepatic Function Panel     Component Value Date/Time   PROT 7.5 12/07/2018 1153   ALBUMIN 3.9 12/07/2018 1153   AST 50 (H) 12/07/2018 1153   ALT 56 (H) 12/07/2018 1153   ALKPHOS 65 12/07/2018 1153   BILITOT 0.6 12/07/2018 1153      Component Value Date/Time   TSH 1.410 12/07/2018 1153   TSH 1.280 05/30/2018 1116   TSH 1.220 04/26/2018 1647      I, Trixie Dredge, am acting as transcriptionist for Abby Potash, PA-C I, Abby Potash, PA-C have reviewed above note and agree with its content

## 2018-12-31 MED FILL — traZODone HCL 50 MG TABS: 50 | 30 days supply | Qty: 30 | Fill #1

## 2019-01-02 ENCOUNTER — Telehealth: Payer: Self-pay | Admitting: Family Medicine

## 2019-01-02 NOTE — Telephone Encounter (Signed)
Please advise 

## 2019-01-02 NOTE — Telephone Encounter (Signed)
Copied from [place] [phone]. Topic: General - Other >> Jan 02, 2019 12:02 PM Yvette Rack wrote: Reason for CRM: Pt requests a Rx for Qsymia for weight loss. Pt stated she needs to have knee surgery and wants assistance in losing some weight. Pt asked that the Rx be sent to [place], [place] - [address] [facility]. Cb# [phone]

## 2019-01-03 ENCOUNTER — Telehealth (INDEPENDENT_AMBULATORY_CARE_PROVIDER_SITE_OTHER): Payer: 59 | Admitting: Bariatrics

## 2019-01-03 ENCOUNTER — Other Ambulatory Visit: Payer: Self-pay

## 2019-01-03 ENCOUNTER — Encounter (INDEPENDENT_AMBULATORY_CARE_PROVIDER_SITE_OTHER): Payer: Self-pay | Admitting: Bariatrics

## 2019-01-03 DIAGNOSIS — G4733 Obstructive sleep apnea (adult) (pediatric): Secondary | ICD-10-CM | POA: Diagnosis not present

## 2019-01-03 DIAGNOSIS — R7303 Prediabetes: Secondary | ICD-10-CM | POA: Diagnosis not present

## 2019-01-03 DIAGNOSIS — I10 Essential (primary) hypertension: Secondary | ICD-10-CM

## 2019-01-03 DIAGNOSIS — Z6841 Body Mass Index (BMI) 40.0 and over, adult: Secondary | ICD-10-CM | POA: Diagnosis not present

## 2019-01-07 NOTE — Progress Notes (Signed)
Office: [phone]  /  Fax: [phone] TeleHealth Visit:  Michelle Huerta has verbally consented to this TeleHealth visit today. The patient is located at home, the provider is located at the [employer] [facility] office. The participants in this visit include the listed provider and patient. [name] was unable to use realtime audiovisual technology today and the telehealth visit was conducted via telephone ( 15 minutes ).   HPI:   Chief Complaint: OBESITY Michelle Huerta is here to discuss her progress with her obesity treatment plan. She is on the  follow the Category 3 plan and is following her eating plan approximately 40 % of the time. She states she is exercising 0 minutes 0 times per week. Michelle Huerta states that she is up 6 lbs (weight 285 lbs). She denies any struggles. She is still not sleeping. She is not doing any stress eating.  We were unable to weigh the patient today for this TeleHealth visit. She feels as if she has gained weight since her last visit. She has lost 8-12 lbs since starting treatment with [place].  Pre-Diabetes Michelle Huerta has a diagnosis of prediabetes based on her elevated HgA1c and was informed this puts her at greater risk of developing diabetes. She is taking metformin currently and continues to work on diet and exercise to decrease risk of diabetes. She denies nausea, polyphagia, or hypoglycemia.  Hypertension Michelle Huerta is a 58 y.o. female with hypertension.  Michelle Huerta denies chest pain or shortness of breath on exertion. She is working weight loss to help control her blood pressure with the goal of decreasing her risk of heart attack and stroke. [name] is currently taking Norvasc and Micardis.   Obstructive Sleep Apnea Michelle Huerta has a diagnosis of OSA. She is using her CPAP and is currently taking Trazadone.   ASSESSMENT AND PLAN:  Prediabetes  Essential hypertension  OSA (obstructive sleep apnea)  Class 3 severe obesity with serious comorbidity and body mass index (BMI) of  40.0 to 44.9 in adult, unspecified obesity type [name])  PLAN: Pre-Diabetes Michelle Huerta will continue to work on weight loss, exercise, and decreasing simple carbohydrates in her diet to help decrease the risk of diabetes. We dicussed metformin including benefits and risks. She was informed that eating too many simple carbohydrates or too many calories at one sitting increases the likelihood of GI side effects. [name] agreed to continue taking Metformin as prescribed.  Michelle Huerta agreed to follow up with [place] as directed to monitor her progress.  Hypertension We discussed sodium restriction, working on healthy weight loss, and a regular exercise program as the means to achieve improved blood pressure control. Michelle Huerta agreed with this plan and agreed to follow up as directed. We will continue to monitor her blood pressure as well as her progress with the above lifestyle modifications. She will continue her medications as prescribed and will watch for signs of hypotension as she continues her lifestyle modifications.  Obstructive Sleep Apnea Michelle Huerta agrees to continue to use her CPAP and will set up an appointment to check CPAP settings. She agreed to follow up as directed.   Obesity Michelle Huerta is currently in the action stage of change. As such, her goal is to continue with weight loss efforts She has agreed to follow the Category 3 plan Michelle Huerta has been instructed to work up to a goal of 150 minutes of combined cardio and strengthening exercise per week for weight loss and overall health benefits. We discussed the following Behavioral Modification Stratagies today:  increasing water intake, keeping healthy foods in the home, no skipping meals, increasing lean protein intake, decreasing simple carbohydrates , increasing vegetables, decrease eating out and work on meal planning and easy cooking plans.    Michelle Huerta has agreed to follow up with our clinic in 2 weeks. She was informed of the importance of frequent follow up visits to  maximize her success with intensive lifestyle modifications for her multiple health conditions.  ALLERGIES: Allergies  Allergen Reactions  . Penicillins Rash and Other (See Comments)    Has patient had a PCN reaction causing immediate rash, facial/tongue/throat swelling, SOB or lightheadedness with hypotension: Yes Has patient had a PCN reaction causing severe rash involving mucus membranes or skin necrosis: No Has patient had a PCN reaction that required hospitalization: No Has patient had a PCN reaction occurring within the last 10 years: No If all of the above answers are "NO", then may proceed with Cephalosporin use.   Michelle Huerta Reclast [Zoledronic Acid] Nausea And Vomiting    MEDICATIONS: Current Outpatient Medications on File Prior to Visit  Medication Sig Dispense Refill  . albuterol (VENTOLIN HFA) 108 (90 Base) MCG/ACT inhaler Inhale 2 puffs into the lungs every 6 (six) hours as needed for wheezing or shortness of breath. 1 Inhaler 0  . amLODipine (NORVASC) 5 MG tablet Take 1 tablet (5 mg total) by mouth daily. 90 tablet 3  . cetirizine (ZYRTEC) 10 MG tablet Take 10 mg by mouth daily.    Michelle Huerta doxycycline (VIBRA-TABS) 100 MG tablet Take 1 tablet (100 mg total) by mouth 2 (two) times daily. 20 tablet 0  . Estradiol 10 MCG INST Place 10 mcg vaginally 2 (two) times a week. (Patient not taking: Reported on 09/17/2018) 12 each 1  . fluticasone (FLONASE) 50 MCG/ACT nasal spray Place 2 sprays into both nostrils daily. 16 g 6  . ibuprofen (ADVIL,MOTRIN) 800 MG tablet Take 1 tablet (800 mg total) by mouth every 8 (eight) hours as needed. 90 tablet 0  . MELATONIN PO Take by mouth.    . metFORMIN (GLUCOPHAGE) 500 MG tablet Take 1 tablet (500 mg total) by mouth 2 (two) times daily with a meal. 60 tablet 0  . misoprostol (CYTOTEC) 200 MCG tablet Cytotec 200 mcg tablet  insert one tablet per vagina at bedtime night prior to procedure    . telmisartan (MICARDIS) 80 MG tablet Take 1 tablet (80 mg total) by  mouth daily. (Patient not taking: Reported on 12/07/2018) 90 tablet 3  . temazepam (RESTORIL) 30 MG capsule     . traZODone (DESYREL) 50 MG tablet Take 1 tablet (50 mg total) by mouth at bedtime. 30 tablet 3  . valACYclovir (VALTREX) 500 MG tablet Take 1 tablet (500 mg total) by mouth daily. 90 tablet 3  . Vitamin D, Ergocalciferol, (DRISDOL) 1.25 MG (50000 UT) CAPS capsule Take 1 capsule (50,000 Units total) by mouth every 7 (seven) days. 4 capsule 0   No current facility-administered medications on file prior to visit.     PAST MEDICAL HISTORY: Past Medical History:  Diagnosis Date  . Allergy   . Arthritis of both knees   . Bilateral chronic knee pain   . Chronic back pain   . Constipation   . DDD (degenerative disc disease), cervical   . Depression    no meds  . Genital herpes    HSV type 2 at rectum, culture positive 3/15  . Glaucoma   . Grade I diastolic dysfunction 16/04/9603   noted on ECHO   .  Heart murmur   . History of colon polyps   . HLD (hyperlipidemia)   . Hypertension   . Hypothyroidism   . Insomnia   . Internal hemorrhoids   . Lactose intolerance   . Lupus ([place])   . LVH (left ventricular hypertrophy) 06/25/2015   Mild, noted on ECHO   . Mild sleep apnea    1/17 CPAP Dr Claiborne Billings does not use cpap   . Morbid obesity ([place]) 06/11/2015  . Osteoporosis   . Paget's disease   . Paget's disease of bone    Dr Trudie Reed, Reclast insufion 02/14/10 - bad reaction to infusion, now tolerates fosamax weekly  . Plantar fasciitis, bilateral   . PMB (postmenopausal bleeding)   . Pre-diabetes   . Sleep apnea   . Thrombocytopenia ([place])   . TR (tricuspid regurgitation) 06/25/2015   Trace, noted on ECHO   . Tubular adenoma polyp of rectum    8/12, Dr Fuller Plan, 5 yr follow up   . Uterine fibroid 10/19/2017   noted on CT pelvis  . Vitamin D deficiency     PAST SURGICAL HISTORY: Past Surgical History:  Procedure Laterality Date  . AUGMENTATION MAMMAPLASTY Bilateral   . BREAST  ENHANCEMENT SURGERY    . BREAST REDUCTION SURGERY    . COLONOSCOPY  01/19/2016  . DILATION AND CURETTAGE OF UTERUS N/A 06/24/2015   Procedure: DILATATION AND CURETTAGE;  Surgeon: Emily Filbert, MD;  Location: [facility];  Service: Gynecology;  Laterality: N/A;  . KNEE ARTHROSCOPY WITH SUBCHONDROPLASTY Right 05/03/2018   Procedure: RIGHT KNEE ARTHROSCOPY WITH DEBRIDEMENT, PARTIAL MEDIAL MENISCECTOMY SUBCHONDROPLASTY MEDIAL TIBIAL PLATEAU AND MEDIAL FEMORAL CONDYLE;  Surgeon: Mcarthur Rossetti, MD;  Location: [facility];  Service: Orthopedics;  Laterality: Right;  . REDUCTION MAMMAPLASTY Bilateral   . TOE SURGERY     removal of bone in each foot  . TUBAL LIGATION      SOCIAL HISTORY: Social History   Tobacco Use  . Smoking status: Former Smoker    Years: 7.00    Types: Cigars    Quit date: 08/11/2008    Years since quitting: 10.4  . Smokeless tobacco: Never Used  . Tobacco comment: smoked Black&Milds, 1 pack per day (5 in a pack)  Substance Use Topics  . Alcohol use: Yes    Alcohol/week: 5.0 standard drinks    Types: 5 Cans of beer per week    Comment: occ   . Drug use: Never    FAMILY HISTORY: Family History  Problem Relation Age of Onset  . Hypertension Mother   . Heart disease Mother   . Hypertension Father   . Hypertension Sister   . Cancer Maternal Grandmother   . Stomach cancer Paternal Grandmother   . Hypertension Son   . Hypertension Son   . Colon cancer Neg Hx     ROS: Review of Systems  Respiratory: Negative for shortness of breath.   Cardiovascular: Negative for chest pain.  Endo/Heme/Allergies:       Negative for hypoglycemia  Negative for polyphagia     PHYSICAL EXAM: Pt in no acute distress  RECENT LABS AND TESTS: BMET    Component Value Date/Time   NA 136 12/07/2018 1153   K 4.2 12/07/2018 1153   CL 101 12/07/2018 1153   CO2 20 12/07/2018 1153   GLUCOSE 89 12/07/2018 1153   GLUCOSE 96 12/02/2018 1500   BUN 15 12/07/2018 1153   CREATININE  0.92 12/07/2018 1153   CALCIUM 8.8 12/07/2018 1153  GFRNONAA 70 12/07/2018 1153   GFRAA 80 12/07/2018 1153   Lab Results  Component Value Date   HGBA1C 5.9 (A) 09/10/2018   HGBA1C 6.3 (H) 05/30/2018   HGBA1C 6.0 (A) 03/09/2018   Lab Results  Component Value Date   INSULIN 41.4 (H) 05/30/2018   CBC    Component Value Date/Time   WBC 5.8 12/07/2018 1153   WBC 5.1 12/02/2018 1500   RBC 4.68 12/07/2018 1153   RBC 4.70 12/02/2018 1500   HGB 12.7 12/07/2018 1153   HCT 37.5 12/07/2018 1153   PLT 138 (L) 12/07/2018 1153   MCV 80 12/07/2018 1153   MCH 27.1 12/07/2018 1153   MCH 27.2 12/02/2018 1500   MCHC 33.9 12/07/2018 1153   MCHC 31.5 12/02/2018 1500   RDW 13.5 12/07/2018 1153   LYMPHSABS 1.7 12/02/2018 1500   LYMPHSABS 2.4 05/30/2018 1116   MONOABS 0.3 12/02/2018 1500   EOSABS 0.0 12/02/2018 1500   EOSABS 0.1 05/30/2018 1116   BASOSABS 0.0 12/02/2018 1500   BASOSABS 0.0 05/30/2018 1116   Iron/TIBC/Ferritin/ %Sat No results found for: IRON, TIBC, FERRITIN, IRONPCTSAT Lipid Panel     Component Value Date/Time   CHOL 134 05/30/2018 1116   TRIG 85 05/30/2018 1116   HDL 75 05/30/2018 1116   CHOLHDL 1.8 05/30/2018 1116   LDLCALC 42 05/30/2018 1116   Hepatic Function Panel     Component Value Date/Time   PROT 7.5 12/07/2018 1153   ALBUMIN 3.9 12/07/2018 1153   AST 50 (H) 12/07/2018 1153   ALT 56 (H) 12/07/2018 1153   ALKPHOS 65 12/07/2018 1153   BILITOT 0.6 12/07/2018 1153      Component Value Date/Time   TSH 1.410 12/07/2018 1153   TSH 1.280 05/30/2018 1116   TSH 1.220 04/26/2018 1647      I, Renee Ramus, am acting as Location manager for [employer], DO   I have reviewed the above documentation for accuracy and completeness, and I agree with the above. Jearld Lesch, DO

## 2019-01-15 ENCOUNTER — Telehealth (INDEPENDENT_AMBULATORY_CARE_PROVIDER_SITE_OTHER): Payer: 59 | Admitting: Registered Nurse

## 2019-01-15 ENCOUNTER — Other Ambulatory Visit: Payer: Self-pay

## 2019-01-15 DIAGNOSIS — R21 Rash and other nonspecific skin eruption: Secondary | ICD-10-CM | POA: Diagnosis not present

## 2019-01-15 MED ORDER — BETAMETHASONE DIPROPIONATE 0.05 % EX CREA: TOPICAL_CREAM | Freq: Two times a day (BID) | CUTANEOUS | 0 refills | Status: DC

## 2019-01-15 MED FILL — BETAMETHASONE DP 0.05% CRM: 0.05 | 7 days supply | Qty: 30 | Fill #0

## 2019-01-15 NOTE — Progress Notes (Signed)
Telemedicine Encounter- SOAP NOTE Established Patient  This telephone encounter was conducted with the patient's (or proxy's) verbal consent via audio telecommunications: yes  Patient was instructed to have this encounter in a suitably private space; and to only have persons present to whom they give permission to participate. In addition, patient identity was confirmed by use of name plus two identifiers (DOB and address).  I discussed the limitations, risks, security and privacy concerns of performing an evaluation and management service by telephone and the availability of in person appointments. I also discussed with the patient that there may be a patient responsible charge related to this service. The patient expressed understanding and agreed to proceed.  I spent a total of 12 minutes talking with the patient or their proxy.  No chief complaint on file.   Subjective   Michelle Huerta is a 58 y.o. established patient. Telephone visit today for rash on both feet.  Pt reports itching and rash of both feet that has happened intermittently for months. She has been seen by [facility] in the past for this issue, and was given triamcinolone which had limited effect.  She states the current episode started around 1-2 weeks prior to today's visit. She states she has itching and peeling along both sets of toes. She states that she thinks it has gotten worse with the warm weather. She usually wears closed toed shoes and seldom is barefoot during the daytime or evenings. She denies purulent discharge, blisters/vesicles, redness, pain. She denies known exposure to any irritating substances or plants. Denies any other associated symptoms  States that she tried neosporin and it had no effect.     Patient Active Problem List   Diagnosis Date Noted  . Fatigue 12/06/2018  . Essential hypertension 12/06/2018  . Arterial hypotension 12/06/2018  . Nausea in adult 12/06/2018  . Anorexia 12/06/2018   . Other insomnia 12/06/2018  . Acute recurrent sinusitis 12/05/2018  . Vitamin D deficiency 07/26/2018  . Class 3 severe obesity with serious comorbidity and body mass index (BMI) of 45.0 to 49.9 in adult ([place]) 07/26/2018  . Status post arthroscopic surgery of right knee 05/09/2018  . Chronic pain of right knee 05/09/2018  . Closed fracture of right tibial plateau   . Unilateral primary osteoarthritis, right knee 04/23/2018  . Acute medial meniscus tear of right knee 04/23/2018  . Closed fracture of medial plateau of left tibia 04/23/2018  . Severe obesity (BMI >= 40) ([place]) 04/23/2018  . Osteoarthritis of right knee 03/09/2018  . Prediabetes 03/09/2018  . Bilateral knee pain 12/28/2017  . Constipation 12/18/2017  . Cough 12/15/2017  . Lower resp. tract infection 12/15/2017  . Acute conjunctivitis of right eye 12/15/2017  . H/O Paget's disease of bone 09/10/2017  . OSA on CPAP 10/28/2015  . Fibroids 08/07/2015  . Plantar fasciitis, bilateral 08/03/2015  . Metatarsal deformity 08/03/2015  . Equinus deformity of foot, acquired 08/03/2015  . Pronation deformity of both feet 08/03/2015  . Morbid obesity ([place]) 06/11/2015  . 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 Essential (primary) hypertension 06/11/2015  . 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 Benign essential HTN 09/19/2013  . Major depressive episode 09/19/2013    Past Medical History:  Diagnosis Date  . Allergy   . Arthritis of both knees   . Bilateral chronic knee pain   . Chronic back pain   . Constipation   . DDD (degenerative disc disease), cervical   . Depression  no meds  . Genital herpes    HSV type 2 at rectum, culture positive 3/15  . Glaucoma   . Grade I diastolic dysfunction 17/61/6073   noted on ECHO   . Heart murmur   . History of colon polyps   . HLD (hyperlipidemia)   . Hypertension   . Hypothyroidism   . Insomnia   . Internal hemorrhoids   . Lactose intolerance   . Lupus ([place])   . LVH  (left ventricular hypertrophy) 06/25/2015   Mild, noted on ECHO   . Mild sleep apnea    1/17 CPAP Dr Claiborne Billings does not use cpap   . Morbid obesity ([place]) 06/11/2015  . Osteoporosis   . Paget's disease   . Paget's disease of bone    Dr Trudie Reed, Reclast insufion 02/14/10 - bad reaction to infusion, now tolerates fosamax weekly  . Plantar fasciitis, bilateral   . PMB (postmenopausal bleeding)   . Pre-diabetes   . Sleep apnea   . Thrombocytopenia ([place])   . TR (tricuspid regurgitation) 06/25/2015   Trace, noted on ECHO   . Tubular adenoma polyp of rectum    8/12, Dr Fuller Plan, 5 yr follow up   . Uterine fibroid 10/19/2017   noted on CT pelvis  . Vitamin D deficiency     Current Outpatient Medications  Medication Sig Dispense Refill  . albuterol (VENTOLIN HFA) 108 (90 Base) MCG/ACT inhaler Inhale 2 puffs into the lungs every 6 (six) hours as needed for wheezing or shortness of breath. 1 Inhaler 0  . amLODipine (NORVASC) 5 MG tablet Take 1 tablet (5 mg total) by mouth daily. 90 tablet 3  . betamethasone dipropionate (DIPROLENE) 0.05 % cream Apply topically 2 (two) times daily. 30 g 0  . cetirizine (ZYRTEC) 10 MG tablet Take 10 mg by mouth daily.    [name] doxycycline (VIBRA-TABS) 100 MG tablet Take 1 tablet (100 mg total) by mouth 2 (two) times daily. 20 tablet 0  . Estradiol 10 MCG INST Place 10 mcg vaginally 2 (two) times a week. (Patient not taking: Reported on 09/17/2018) 12 each 1  . fluticasone (FLONASE) 50 MCG/ACT nasal spray Place 2 sprays into both nostrils daily. 16 g 6  . ibuprofen (ADVIL,MOTRIN) 800 MG tablet Take 1 tablet (800 mg total) by mouth every 8 (eight) hours as needed. 90 tablet 0  . MELATONIN PO Take by mouth.    . metFORMIN (GLUCOPHAGE) 500 MG tablet Take 1 tablet (500 mg total) by mouth 2 (two) times daily with a meal. 60 tablet 0  . misoprostol (CYTOTEC) 200 MCG tablet Cytotec 200 mcg tablet  insert one tablet per vagina at bedtime night prior to procedure    . telmisartan  (MICARDIS) 80 MG tablet Take 1 tablet (80 mg total) by mouth daily. (Patient not taking: Reported on 12/07/2018) 90 tablet 3  . temazepam (RESTORIL) 30 MG capsule     . traZODone (DESYREL) 50 MG tablet Take 1 tablet (50 mg total) by mouth at bedtime. 30 tablet 3  . valACYclovir (VALTREX) 500 MG tablet Take 1 tablet (500 mg total) by mouth daily. 90 tablet 3  . Vitamin D, Ergocalciferol, (DRISDOL) 1.25 MG (50000 UT) CAPS capsule Take 1 capsule (50,000 Units total) by mouth every 7 (seven) days. 4 capsule 0   No current facility-administered medications for this visit.     Allergies  Allergen Reactions  . Penicillins Rash and Other (See Comments)    Has patient had a PCN reaction causing immediate  rash, facial/tongue/throat swelling, SOB or lightheadedness with hypotension: Yes Has patient had a PCN reaction causing severe rash involving mucus membranes or skin necrosis: No Has patient had a PCN reaction that required hospitalization: No Has patient had a PCN reaction occurring within the last 10 years: No If all of the above answers are "NO", then may proceed with Cephalosporin use.   [name] Reclast [Zoledronic Acid] Nausea And Vomiting    Social History   Socioeconomic History  . Marital status: Single    Spouse name: Not on file  . Number of children: Not on file  . Years of education: Not on file  . Highest education level: Not on file  Occupational History  . Occupation: Actuary- [employer]  Social Needs  . Financial resource strain: Not on file  . Food insecurity    Worry: Not on file    Inability: Not on file  . Transportation needs    Medical: Not on file    Non-medical: Not on file  Tobacco Use  . Smoking status: Former Smoker    Years: 7.00    Types: Cigars    Quit date: 08/11/2008    Years since quitting: 10.4  . Smokeless tobacco: Never Used  . Tobacco comment: smoked [employer], 1 pack per day (5 in a pack)  Substance and Sexual Activity  . Alcohol use: Yes     Alcohol/week: 5.0 standard drinks    Types: 5 Cans of beer per week    Comment: occ   . Drug use: Never  . Sexual activity: Not Currently    Partners: Male    Birth control/protection: Surgical  Lifestyle  . Physical activity    Days per week: Not on file    Minutes per session: Not on file  . Stress: Not on file  Relationships  . Social Herbalist on phone: Not on file    Gets together: Not on file    Attends religious service: Not on file    Active member of club or organization: Not on file    Attends meetings of clubs or organizations: Not on file    Relationship status: Not on file  . Intimate partner violence    Fear of current or ex partner: Not on file    Emotionally abused: Not on file    Physically abused: Not on file    Forced sexual activity: Not on file  Other Topics Concern  . Not on file  Social History Narrative   Epworth sleepiness score as of 06/11/15 a 4   Exercise: yes, active at work, no formal exercise   Single, never married, in relationship off and on   Children: [place], [place] (both in [place]), 2 grandkids.  Raised 2 nieces [name], [address] (they have 5 kids)   Occupation: [employer]   Religion: faith important, not active in a church   Seatbelt use: yes    Review of Systems  Constitutional: Negative.   HENT: Negative.   Eyes: Negative.   Respiratory: Negative.   Cardiovascular: Negative.   Gastrointestinal: Negative.   Genitourinary: Negative.   Musculoskeletal: Negative.   Skin: Negative.   Neurological: Negative.   Endo/Heme/Allergies: Negative.   Psychiatric/Behavioral: Negative.     Objective   Vitals as reported by the patient: There were no vitals filed for this visit.  Diagnoses and all orders for this visit:  Rash and nonspecific skin eruption -     betamethasone dipropionate (DIPROLENE) 0.05 %  cream; Apply topically 2 (two) times daily.   PLAN:  Likely eczema given recurrence,  symptoms.  As triamcinolone 0.05% was not effective - choosing a somewhat more potent topical steroid in the betamethasone 0.05% cream. Apply twice daily.  Discussed non pharm - moisturize, keep feet clean and dry, wear open toed shoes or barefoot when at home.  Present to clinic for in person appt if symptoms worsen or fail to improve  Patient encouraged to call clinic with any questions, comments, or concerns.    I discussed the assessment and treatment plan with the patient. The patient was provided an opportunity to ask questions and all were answered. The patient agreed with the plan and demonstrated an understanding of the instructions.   The patient was advised to call back or seek an in-person evaluation if the symptoms worsen or if the condition fails to improve as anticipated.  I provided 12 minutes of non-face-to-face time during this encounter.  Maximiano Coss, NP  [facility]

## 2019-01-17 NOTE — Telephone Encounter (Signed)
She would need to be seen in office due to this being a controlled substance. Also, we would want to have a conversation with her about other options as well.  Thanks, Linus Orn

## 2019-01-23 ENCOUNTER — Other Ambulatory Visit: Payer: Self-pay

## 2019-01-23 ENCOUNTER — Encounter (INDEPENDENT_AMBULATORY_CARE_PROVIDER_SITE_OTHER): Payer: Self-pay | Admitting: Bariatrics

## 2019-01-23 ENCOUNTER — Ambulatory Visit (INDEPENDENT_AMBULATORY_CARE_PROVIDER_SITE_OTHER): Payer: 59 | Admitting: Bariatrics

## 2019-01-23 VITALS — BP 118/84 | HR 73 | Temp 98.0°F | Ht 67.0 in | Wt 285.0 lb

## 2019-01-23 DIAGNOSIS — R7303 Prediabetes: Secondary | ICD-10-CM

## 2019-01-23 DIAGNOSIS — Z6841 Body Mass Index (BMI) 40.0 and over, adult: Secondary | ICD-10-CM

## 2019-01-23 DIAGNOSIS — E559 Vitamin D deficiency, unspecified: Secondary | ICD-10-CM | POA: Diagnosis not present

## 2019-01-23 DIAGNOSIS — Z9189 Other specified personal risk factors, not elsewhere classified: Secondary | ICD-10-CM | POA: Diagnosis not present

## 2019-01-23 DIAGNOSIS — I10 Essential (primary) hypertension: Secondary | ICD-10-CM | POA: Diagnosis not present

## 2019-01-23 DIAGNOSIS — E66813 Obesity, class 3: Secondary | ICD-10-CM

## 2019-01-23 NOTE — Progress Notes (Signed)
Office: [phone]  /  Fax: [phone]   HPI:   Chief Complaint: OBESITY Michelle Huerta is here to discuss her progress with her obesity treatment plan. She is on the Category 3 plan and is following her eating plan approximately 30 to 40 % of the time. She states she is exercising 0 minutes 0 times per week. Michelle Huerta is down six pounds. Her last visit in the office was 09/04/18. She plans to have right knee repair surgery in September, and she needs her BMI to be 40. Her weight is 285 lb (129.3 kg) today and has had a weight loss of 6 pounds since her last in-office visit. She has lost 11 lbs since starting treatment with [place].  Pre-Diabetes Michelle Huerta has a diagnosis of prediabetes based on her elevated Hgb A1c and was informed this puts her at greater risk of developing diabetes. She is taking metformin currently and continues to work on diet and exercise to decrease risk of diabetes. She denies nausea or hypoglycemia.  At risk for diabetes Michelle Huerta is at higher than average risk for developing diabetes due to her obesity and prediabetes. She currently denies polyuria or polydipsia.  Hypertension Michelle Huerta is a 58 y.o. female with hypertension. She is taking Norvasc and Micardis. Michelle Huerta denies chest pain or shortness of breath on exertion. She is working weight loss to help control her blood pressure with the goal of decreasing her risk of heart attack and stroke. Michelle Huerta blood pressure is well controlled.  Vitamin D deficiency [name] has a diagnosis of vitamin D deficiency. [name] is not currently taking vit D and she denies nausea, vomiting or muscle weakness.  ASSESSMENT AND PLAN:  Prediabetes  At risk for diabetes  Hypertension  Obesity  Vitamin D deficiency   PLAN:  Pre-Diabetes Michelle Huerta will continue to work on weight loss, exercise, and decreasing simple carbohydrates in her diet to help decrease the risk of diabetes. We dicussed metformin including benefits and risks. She was informed  that eating too many simple carbohydrates or too many calories at one sitting increases the likelihood of GI side effects. Michelle Huerta will continue metformin for now and a prescription was not written today. Anshi agreed to follow up with [place] as directed to monitor her progress.  Diabetes risk counseling Michelle Huerta was given extended (15 minutes) diabetes prevention counseling today. She is 58 y.o. female and has risk factors for diabetes including obesity and prediabetes. We discussed intensive lifestyle modifications today with an emphasis on weight loss as well as increasing exercise and decreasing simple carbohydrates in her diet.  Hypertension We discussed sodium restriction, working on healthy weight loss, and a regular exercise program as the means to achieve improved blood pressure control. Michelle Huerta agreed with this plan and agreed to follow up as directed. We will continue to monitor her blood pressure as well as her progress with the above lifestyle modifications. She will continue her medications as prescribed and will watch for signs of hypotension as she continues her lifestyle modifications.  Vitamin D Deficiency Michelle Huerta was informed that low vitamin D levels contributes to fatigue and are associated with obesity, breast, and colon cancer. We will check vitamin D level today and she will follow up for routine testing of vitamin D, at least 2-3 times per year.   Obesity Michelle Huerta is currently in the action stage of change. As such, her goal is to continue with weight loss efforts She has agreed to follow the Category 3 plan Michelle Huerta will increase  walking with brace for weight loss and overall health benefits. We discussed the following Behavioral Modification Strategies today: planning for success, increase H2O intake, no skipping meals, keeping healthy foods in the home, increasing lean protein intake, decreasing simple carbohydrates, increasing vegetables, decrease eating out and work on meal planning and  intentional eating Michelle Huerta will stick with the plan.  Michelle Huerta has agreed to follow up with our clinic in 2 weeks. She was informed of the importance of frequent follow up visits to maximize her success with intensive lifestyle modifications for her multiple health conditions.  ALLERGIES: Allergies  Allergen Reactions  . Penicillins Rash and Other (See Comments)    Has patient had a PCN reaction causing immediate rash, facial/tongue/throat swelling, SOB or lightheadedness with hypotension: Yes Has patient had a PCN reaction causing severe rash involving mucus membranes or skin necrosis: No Has patient had a PCN reaction that required hospitalization: No Has patient had a PCN reaction occurring within the last 10 years: No If all of the above answers are "NO", then may proceed with Cephalosporin use.   Michelle Huerta Reclast [Zoledronic Acid] Nausea And Vomiting    MEDICATIONS: Current Outpatient Medications on File Prior to Visit  Medication Sig Dispense Refill  . albuterol (VENTOLIN HFA) 108 (90 Base) MCG/ACT inhaler Inhale 2 puffs into the lungs every 6 (six) hours as needed for wheezing or shortness of breath. 1 Inhaler 0  . amLODipine (NORVASC) 5 MG tablet Take 1 tablet (5 mg total) by mouth daily. 90 tablet 3  . betamethasone dipropionate (DIPROLENE) 0.05 % cream Apply topically 2 (two) times daily. 30 g 0  . cetirizine (ZYRTEC) 10 MG tablet Take 10 mg by mouth daily.    Michelle Huerta doxycycline (VIBRA-TABS) 100 MG tablet Take 1 tablet (100 mg total) by mouth 2 (two) times daily. 20 tablet 0  . Estradiol 10 MCG INST Place 10 mcg vaginally 2 (two) times a week. 12 each 1  . fluticasone (FLONASE) 50 MCG/ACT nasal spray Place 2 sprays into both nostrils daily. 16 g 6  . ibuprofen (ADVIL,MOTRIN) 800 MG tablet Take 1 tablet (800 mg total) by mouth every 8 (eight) hours as needed. 90 tablet 0  . MELATONIN PO Take by mouth.    . metFORMIN (GLUCOPHAGE) 500 MG tablet Take 1 tablet (500 mg total) by mouth 2 (two) times  daily with a meal. 60 tablet 0  . misoprostol (CYTOTEC) 200 MCG tablet Cytotec 200 mcg tablet  insert one tablet per vagina at bedtime night prior to procedure    . telmisartan (MICARDIS) 80 MG tablet Take 1 tablet (80 mg total) by mouth daily. 90 tablet 3  . temazepam (RESTORIL) 30 MG capsule     . traZODone (DESYREL) 50 MG tablet Take 1 tablet (50 mg total) by mouth at bedtime. 30 tablet 3  . valACYclovir (VALTREX) 500 MG tablet Take 1 tablet (500 mg total) by mouth daily. 90 tablet 3  . Vitamin D, Ergocalciferol, (DRISDOL) 1.25 MG (50000 UT) CAPS capsule Take 1 capsule (50,000 Units total) by mouth every 7 (seven) days. 4 capsule 0   No current facility-administered medications on file prior to visit.     PAST MEDICAL HISTORY: Past Medical History:  Diagnosis Date  . Allergy   . Arthritis of both knees   . Bilateral chronic knee pain   . Chronic back pain   . Constipation   . DDD (degenerative disc disease), cervical   . Depression    no meds  . Genital  herpes    HSV type 2 at rectum, culture positive 3/15  . Glaucoma   . Grade I diastolic dysfunction 82/95/6213   noted on ECHO   . Heart murmur   . History of colon polyps   . HLD (hyperlipidemia)   . Hypertension   . Hypothyroidism   . Insomnia   . Internal hemorrhoids   . Lactose intolerance   . Lupus ([place])   . LVH (left ventricular hypertrophy) 06/25/2015   Mild, noted on ECHO   . Mild sleep apnea    1/17 CPAP Dr Claiborne Billings does not use cpap   . Morbid obesity ([place]) 06/11/2015  . Osteoporosis   . Paget's disease   . Paget's disease of bone    Dr Trudie Reed, Reclast insufion 02/14/10 - bad reaction to infusion, now tolerates fosamax weekly  . Plantar fasciitis, bilateral   . PMB (postmenopausal bleeding)   . Pre-diabetes   . Sleep apnea   . Thrombocytopenia ([place])   . TR (tricuspid regurgitation) 06/25/2015   Trace, noted on ECHO   . Tubular adenoma polyp of rectum    8/12, Dr Fuller Plan, 5 yr follow up   . Uterine fibroid  10/19/2017   noted on CT pelvis  . Vitamin D deficiency     PAST SURGICAL HISTORY: Past Surgical History:  Procedure Laterality Date  . AUGMENTATION MAMMAPLASTY Bilateral   . BREAST ENHANCEMENT SURGERY    . BREAST REDUCTION SURGERY    . COLONOSCOPY  01/19/2016  . DILATION AND CURETTAGE OF UTERUS N/A 06/24/2015   Procedure: DILATATION AND CURETTAGE;  Surgeon: Emily Filbert, MD;  Location: [facility];  Service: Gynecology;  Laterality: N/A;  . KNEE ARTHROSCOPY WITH SUBCHONDROPLASTY Right 05/03/2018   Procedure: RIGHT KNEE ARTHROSCOPY WITH DEBRIDEMENT, PARTIAL MEDIAL MENISCECTOMY SUBCHONDROPLASTY MEDIAL TIBIAL PLATEAU AND MEDIAL FEMORAL CONDYLE;  Surgeon: Mcarthur Rossetti, MD;  Location: [facility];  Service: Orthopedics;  Laterality: Right;  . REDUCTION MAMMAPLASTY Bilateral   . TOE SURGERY     removal of bone in each foot  . TUBAL LIGATION      SOCIAL HISTORY: Social History   Tobacco Use  . Smoking status: Former Smoker    Years: 7.00    Types: Cigars    Quit date: 08/11/2008    Years since quitting: 10.4  . Smokeless tobacco: Never Used  . Tobacco comment: smoked [employer], 1 pack per day (5 in a pack)  Substance Use Topics  . Alcohol use: Yes    Alcohol/week: 5.0 standard drinks    Types: 5 Cans of beer per week    Comment: occ   . Drug use: Never    FAMILY HISTORY: Family History  Problem Relation Age of Onset  . Hypertension Mother   . Heart disease Mother   . Hypertension Father   . Hypertension Sister   . Cancer Maternal Grandmother   . Stomach cancer Paternal Grandmother   . Hypertension Son   . Hypertension Son   . Colon cancer Neg Hx     ROS: Review of Systems  Constitutional: Positive for weight loss.  Respiratory: Negative for shortness of breath (on exertion).   Cardiovascular: Negative for chest pain.  Gastrointestinal: Negative for nausea and vomiting.  Genitourinary: Negative for frequency.  Musculoskeletal:       Negative for muscle weakness   Endo/Heme/Allergies: Negative for polydipsia.       Negative for hypoglycemia    PHYSICAL EXAM: Blood pressure 118/84, pulse 73, temperature 98 F (36.7 C), temperature  source Oral, height 5\' 7"  (1.702 m), weight 285 lb (129.3 kg), last menstrual period 08/01/2012, SpO2 100 %. Body mass index is 44.64 kg/m. Physical Exam Vitals signs reviewed.  Constitutional:      Appearance: Normal appearance. She is well-developed. She is obese.  Cardiovascular:     Rate and Rhythm: Normal rate.  Pulmonary:     Effort: Pulmonary effort is normal.  Musculoskeletal: Normal range of motion.  Skin:    General: Skin is warm and dry.  Neurological:     Mental Status: She is alert and oriented to person, place, and time.  Psychiatric:        Mood and Affect: Mood normal.        Behavior: Behavior normal.     RECENT LABS AND TESTS: BMET    Component Value Date/Time   NA 136 12/07/2018 1153   K 4.2 12/07/2018 1153   CL 101 12/07/2018 1153   CO2 20 12/07/2018 1153   GLUCOSE 89 12/07/2018 1153   GLUCOSE 96 12/02/2018 1500   BUN 15 12/07/2018 1153   CREATININE 0.92 12/07/2018 1153   CALCIUM 8.8 12/07/2018 1153   GFRNONAA 70 12/07/2018 1153   GFRAA 80 12/07/2018 1153   Lab Results  Component Value Date   HGBA1C 5.9 (A) 09/10/2018   HGBA1C 6.3 (H) 05/30/2018   HGBA1C 6.0 (A) 03/09/2018   Lab Results  Component Value Date   INSULIN 41.4 (H) 05/30/2018   CBC    Component Value Date/Time   WBC 5.8 12/07/2018 1153   WBC 5.1 12/02/2018 1500   RBC 4.68 12/07/2018 1153   RBC 4.70 12/02/2018 1500   HGB 12.7 12/07/2018 1153   HCT 37.5 12/07/2018 1153   PLT 138 (L) 12/07/2018 1153   MCV 80 12/07/2018 1153   MCH 27.1 12/07/2018 1153   MCH 27.2 12/02/2018 1500   MCHC 33.9 12/07/2018 1153   MCHC 31.5 12/02/2018 1500   RDW 13.5 12/07/2018 1153   LYMPHSABS 1.7 12/02/2018 1500   LYMPHSABS 2.4 05/30/2018 1116   MONOABS 0.3 12/02/2018 1500   EOSABS 0.0 12/02/2018 1500   EOSABS 0.1  05/30/2018 1116   BASOSABS 0.0 12/02/2018 1500   BASOSABS 0.0 05/30/2018 1116   Iron/TIBC/Ferritin/ %Sat No results found for: IRON, TIBC, FERRITIN, IRONPCTSAT Lipid Panel     Component Value Date/Time   CHOL 134 05/30/2018 1116   TRIG 85 05/30/2018 1116   HDL 75 05/30/2018 1116   CHOLHDL 1.8 05/30/2018 1116   LDLCALC 42 05/30/2018 1116   Hepatic Function Panel     Component Value Date/Time   PROT 7.5 12/07/2018 1153   ALBUMIN 3.9 12/07/2018 1153   AST 50 (H) 12/07/2018 1153   ALT 56 (H) 12/07/2018 1153   ALKPHOS 65 12/07/2018 1153   BILITOT 0.6 12/07/2018 1153      Component Value Date/Time   TSH 1.410 12/07/2018 1153   TSH 1.280 05/30/2018 1116   TSH 1.220 04/26/2018 1647    Results for Parma, Kyung L (MRN [id number]) as of 01/23/2019 14:14  Ref. Range 09/10/2018 11:51  Vitamin D, 25-Hydroxy Latest Ref Range: 30.0 - 100.0 ng/mL 42.3    OBESITY BEHAVIORAL INTERVENTION VISIT  Today's visit was # 10   Starting weight: 296 lbs Starting date: 05/30/2018 Today's weight : 285 lbs Today's date: 01/23/2019 Total lbs lost to date: 11    01/23/2019  Height 5\' 7"  (1.702 m)  Weight 285 lb (129.3 kg)  BMI (Calculated) 44.63  BLOOD PRESSURE - SYSTOLIC 253  BLOOD PRESSURE - DIASTOLIC 84   Body Fat % 00.9 %  Total Body Water (lbs) 90 lbs    ASK: We discussed the diagnosis of obesity with Michelle Huerta today and Brigitte agreed to give [place] permission to discuss obesity behavioral modification therapy today.  ASSESS: Delorice has the diagnosis of obesity and her BMI today is 44.63 Elma is in the action stage of change   ADVISE: Parish was educated on the multiple health risks of obesity as well as the benefit of weight loss to improve her health. She was advised of the need for long term treatment and the importance of lifestyle modifications to improve her current health and to decrease her risk of future health problems.  AGREE: Multiple dietary modification options and treatment  options were discussed and  Lashandra agreed to follow the recommendations documented in the above note.  ARRANGE: Teliyah was educated on the importance of frequent visits to treat obesity as outlined per CMS and USPSTF guidelines and agreed to schedule her next follow up appointment today.  Corey Skains, am acting as Location manager for [employer]. Owens Shark, DO  I have reviewed the above documentation for accuracy and completeness, and I agree with the above. -Jearld Lesch, DO

## 2019-01-24 LAB — VITAMIN D 25 HYDROXY (VIT D DEFICIENCY, FRACTURES): Vit D, 25-Hydroxy: 27.9 ng/mL — ABNORMAL LOW (ref 30.0–100.0)

## 2019-01-29 ENCOUNTER — Other Ambulatory Visit: Payer: Self-pay | Admitting: Obstetrics and Gynecology

## 2019-01-29 ENCOUNTER — Other Ambulatory Visit: Payer: Self-pay | Admitting: Family Medicine

## 2019-01-29 DIAGNOSIS — Z1231 Encounter for screening mammogram for malignant neoplasm of breast: Secondary | ICD-10-CM

## 2019-01-30 ENCOUNTER — Ambulatory Visit: Payer: 59 | Admitting: Gastroenterology

## 2019-02-06 DIAGNOSIS — L930 Discoid lupus erythematosus: Secondary | ICD-10-CM | POA: Diagnosis not present

## 2019-02-06 DIAGNOSIS — M25569 Pain in unspecified knee: Secondary | ICD-10-CM | POA: Diagnosis not present

## 2019-02-06 DIAGNOSIS — M889 Osteitis deformans of unspecified bone: Secondary | ICD-10-CM | POA: Diagnosis not present

## 2019-02-06 DIAGNOSIS — M1990 Unspecified osteoarthritis, unspecified site: Secondary | ICD-10-CM | POA: Diagnosis not present

## 2019-02-06 DIAGNOSIS — M170 Bilateral primary osteoarthritis of knee: Secondary | ICD-10-CM | POA: Diagnosis not present

## 2019-02-06 DIAGNOSIS — M7581 Other shoulder lesions, right shoulder: Secondary | ICD-10-CM | POA: Diagnosis not present

## 2019-02-06 DIAGNOSIS — M25511 Pain in right shoulder: Secondary | ICD-10-CM | POA: Diagnosis not present

## 2019-02-07 ENCOUNTER — Encounter (INDEPENDENT_AMBULATORY_CARE_PROVIDER_SITE_OTHER): Payer: Self-pay | Admitting: Bariatrics

## 2019-02-07 ENCOUNTER — Ambulatory Visit (INDEPENDENT_AMBULATORY_CARE_PROVIDER_SITE_OTHER): Payer: 59 | Admitting: Bariatrics

## 2019-02-07 ENCOUNTER — Other Ambulatory Visit: Payer: Self-pay

## 2019-02-07 VITALS — BP 139/91 | HR 85 | Temp 97.9°F | Ht 67.0 in | Wt 283.0 lb

## 2019-02-07 DIAGNOSIS — Z6841 Body Mass Index (BMI) 40.0 and over, adult: Secondary | ICD-10-CM | POA: Diagnosis not present

## 2019-02-07 DIAGNOSIS — E559 Vitamin D deficiency, unspecified: Secondary | ICD-10-CM | POA: Diagnosis not present

## 2019-02-07 DIAGNOSIS — R7303 Prediabetes: Secondary | ICD-10-CM | POA: Diagnosis not present

## 2019-02-07 DIAGNOSIS — Z9189 Other specified personal risk factors, not elsewhere classified: Secondary | ICD-10-CM | POA: Diagnosis not present

## 2019-02-07 MED ORDER — METFORMIN HCL 500 MG PO TABS: 500.0000 mg | ORAL_TABLET | Freq: Two times a day (BID) | ORAL | 0 refills | Status: DC

## 2019-02-07 MED ORDER — VITAMIN D (ERGOCALCIFEROL) 1.25 MG (50000 UNIT) PO CAPS: 50000.0000 [IU] | ORAL_CAPSULE | ORAL | 0 refills | Status: DC

## 2019-02-07 MED FILL — metFORMIN HCL 500 MG TABS: 500 | 30 days supply | Qty: 60 | Fill #0

## 2019-02-07 MED FILL — VIT D2 1.25 MG (50,000 UNIT: 1.25 MG | 28 days supply | Qty: 4 | Fill #0

## 2019-02-07 NOTE — Progress Notes (Signed)
Office: [phone]  /  Fax: [phone]   HPI:   Chief Complaint: OBESITY Glady is here to discuss her progress with her obesity treatment plan. She is on the Category 3 plan and is following her eating plan approximately 30% of the time. She states she is exercising 0 minutes 0 times per week. Orvella is down 2 lbs. She reports drinking more water and is doing well with her protein intake.  Her weight is 283 lb (128.4 kg) today and has had a weight loss of 2 pounds over a period of 2 weeks since her last visit. She has lost 13 lbs since starting treatment with [place].  Pre-Diabetes Persephanie has a diagnosis of prediabetes based on her elevated Hgb A1c and was informed this puts her at greater risk of developing diabetes. Her last A1c was 5.9 on 09/10/2018. She is taking metformin currently and continues to work on diet and exercise to decrease risk of diabetes. No polyphagia.  At risk for diabetes Millette is at higher than average risk for developing diabetes due to her obesity. She currently denies polyuria or polydipsia.  Vitamin D deficiency Nohelia has a diagnosis of Vitamin D deficiency. Her last Vitamin D level was 27.9 on 01/23/2019. She is currently taking prescription Vit D and denies nausea, vomiting or muscle weakness.  ASSESSMENT AND PLAN:  Prediabetes - Plan: Hemoglobin A1c, Insulin, random, metFORMIN (GLUCOPHAGE) 500 MG tablet  Vitamin D deficiency - Plan: Vitamin D, Ergocalciferol, (DRISDOL) 1.25 MG (50000 UT) CAPS capsule  At risk for diabetes mellitus  Class 3 severe obesity with serious comorbidity and body mass index (BMI) of 40.0 to 44.9 in adult, unspecified obesity type [facility])  PLAN:  Pre-Diabetes Dalaina will continue to work on weight loss, exercise, and decreasing simple carbohydrates in her diet to help decrease the risk of diabetes. We dicussed metformin including benefits and risks. She was informed that eating too many simple carbohydrates or too many calories at one  sitting increases the likelihood of GI side effects. Amely was given a prescription for metformin 500 mg 1 tablet BID #60 with 0 refills and agrees to follow-up with our clinic in 2 wees.  Diabetes risk counseling Ezinne was given extended (15 minutes) diabetes prevention counseling today. She is 58 y.o. female and has risk factors for diabetes including obesity. We discussed intensive lifestyle modifications today with an emphasis on weight loss as well as increasing exercise and decreasing simple carbohydrates in her diet.  Vitamin D Deficiency Daneli was informed that low Vitamin D levels contributes to fatigue and are associated with obesity, breast, and colon cancer. She agrees to continue to take prescription Vit D @ 50,000 IU every week #4 with 0 refills and will follow-up for routine testing of Vitamin D, at least 2-3 times per year. She was informed of the risk of over-replacement of Vitamin D and agrees to not increase her dose unless she discusses this with [place] first. Matthew agrees to follow-up with our clinic in 2 weeks.  Obesity Tanna is currently in the action stage of change. As such, her goal is to continue with weight loss efforts. She has agreed to follow the Category 3 plan. Kayah will work on [employer], intentional eating, and increasing her protein. She will stop soda and decrease her chips. Oaklee has been instructed to work up to a goal of 150 minutes of combined cardio and strengthening exercise per week for weight loss and overall health benefits. We discussed the following Behavioral Modification  Strategies today: increasing lean protein intake, decreasing simple carbohydrates, increasing vegetables, increase H20 intake, decrease eating out, no skipping meals, work on meal planning and easy cooking plans, and keeping healthy foods in the home.   Marene has agreed to follow-up with our clinic in 2 weeks. She was informed of the importance of frequent follow-up visits to maximize her  success with intensive lifestyle modifications for her multiple health conditions.  ALLERGIES: Allergies  Allergen Reactions   Penicillins Rash and Other (See Comments)    Has patient had a PCN reaction causing immediate rash, facial/tongue/throat swelling, SOB or lightheadedness with hypotension: Yes Has patient had a PCN reaction causing severe rash involving mucus membranes or skin necrosis: No Has patient had a PCN reaction that required hospitalization: No Has patient had a PCN reaction occurring within the last 10 years: No If all of the above answers are "NO", then may proceed with Cephalosporin use.    Reclast [Zoledronic Acid] Nausea And Vomiting    MEDICATIONS: Current Outpatient Medications on File Prior to Visit  Medication Sig Dispense Refill   albuterol (VENTOLIN HFA) 108 (90 Base) MCG/ACT inhaler Inhale 2 puffs into the lungs every 6 (six) hours as needed for wheezing or shortness of breath. 1 Inhaler 0   amLODipine (NORVASC) 5 MG tablet Take 1 tablet (5 mg total) by mouth daily. 90 tablet 3   betamethasone dipropionate (DIPROLENE) 0.05 % cream Apply topically 2 (two) times daily. 30 g 0   cetirizine (ZYRTEC) 10 MG tablet Take 10 mg by mouth daily.     doxycycline (VIBRA-TABS) 100 MG tablet Take 1 tablet (100 mg total) by mouth 2 (two) times daily. 20 tablet 0   Estradiol 10 MCG INST Place 10 mcg vaginally 2 (two) times a week. 12 each 1   fluticasone (FLONASE) 50 MCG/ACT nasal spray Place 2 sprays into both nostrils daily. 16 g 6   ibuprofen (ADVIL,MOTRIN) 800 MG tablet Take 1 tablet (800 mg total) by mouth every 8 (eight) hours as needed. 90 tablet 0   MELATONIN PO Take by mouth.     misoprostol (CYTOTEC) 200 MCG tablet Cytotec 200 mcg tablet  insert one tablet per vagina at bedtime night prior to procedure     telmisartan (MICARDIS) 80 MG tablet Take 1 tablet (80 mg total) by mouth daily. 90 tablet 3   temazepam (RESTORIL) 30 MG capsule      traZODone  (DESYREL) 50 MG tablet Take 1 tablet (50 mg total) by mouth at bedtime. 30 tablet 3   valACYclovir (VALTREX) 500 MG tablet Take 1 tablet (500 mg total) by mouth daily. 90 tablet 3   No current facility-administered medications on file prior to visit.     PAST MEDICAL HISTORY: Past Medical History:  Diagnosis Date   Allergy    Arthritis of both knees    Bilateral chronic knee pain    Chronic back pain    Constipation    DDD (degenerative disc disease), cervical    Depression    no meds   Genital herpes    HSV type 2 at rectum, culture positive 3/15   Glaucoma    Grade I diastolic dysfunction 78/46/9629   noted on ECHO    Heart murmur    History of colon polyps    HLD (hyperlipidemia)    Hypertension    Hypothyroidism    Insomnia    Internal hemorrhoids    Lactose intolerance    Lupus (HCC)    LVH (left  ventricular hypertrophy) 06/25/2015   Mild, noted on ECHO    Mild sleep apnea    1/17 CPAP Dr Claiborne Billings does not use cpap    Morbid obesity ([place]) 06/11/2015   Osteoporosis    Paget's disease    Paget's disease of bone    Dr Trudie Reed, Reclast insufion 02/14/10 - bad reaction to infusion, now tolerates fosamax weekly   Plantar fasciitis, bilateral    PMB (postmenopausal bleeding)    Pre-diabetes    Sleep apnea    Thrombocytopenia (HCC)    TR (tricuspid regurgitation) 06/25/2015   Trace, noted on ECHO    Tubular adenoma polyp of rectum    8/12, Dr Fuller Plan, 5 yr follow up    Uterine fibroid 10/19/2017   noted on CT pelvis   Vitamin D deficiency     PAST SURGICAL HISTORY: Past Surgical History:  Procedure Laterality Date   AUGMENTATION MAMMAPLASTY Bilateral    BREAST ENHANCEMENT SURGERY     BREAST REDUCTION SURGERY     COLONOSCOPY  01/19/2016   DILATION AND CURETTAGE OF UTERUS N/A 06/24/2015   Procedure: DILATATION AND CURETTAGE;  Surgeon: Emily Filbert, MD;  Location: [place] [facility];  Service: Gynecology;  Laterality: N/A;   KNEE ARTHROSCOPY  WITH SUBCHONDROPLASTY Right 05/03/2018   Procedure: RIGHT KNEE ARTHROSCOPY WITH DEBRIDEMENT, PARTIAL MEDIAL MENISCECTOMY SUBCHONDROPLASTY MEDIAL TIBIAL PLATEAU AND MEDIAL FEMORAL CONDYLE;  Surgeon: Mcarthur Rossetti, MD;  Location: [facility];  Service: Orthopedics;  Laterality: Right;   REDUCTION MAMMAPLASTY Bilateral    TOE SURGERY     removal of bone in each foot   TUBAL LIGATION      SOCIAL HISTORY: Social History   Tobacco Use   Smoking status: Former Smoker    Years: 7.00    Types: Cigars    Quit date: 08/11/2008    Years since quitting: 10.4   Smokeless tobacco: Never Used   Tobacco comment: smoked [employer], 1 pack per day (5 in a pack)  Substance Use Topics   Alcohol use: Yes    Alcohol/week: 5.0 standard drinks    Types: 5 Cans of beer per week    Comment: occ    Drug use: Never    FAMILY HISTORY: Family History  Problem Relation Age of Onset   Hypertension Mother    Heart disease Mother    Hypertension Father    Hypertension Sister    Cancer Maternal Grandmother    Stomach cancer Paternal Grandmother    Hypertension Son    Hypertension Son    Colon cancer Neg Hx    ROS: Review of Systems  Gastrointestinal: Negative for nausea and vomiting.  Musculoskeletal:       Negative for muscle weakness.  Endo/Heme/Allergies:       Negative for polyphagia.   PHYSICAL EXAM: Blood pressure (!) 139/91, pulse 85, temperature 97.9 F (36.6 C), temperature source Oral, height 5\' 7"  (1.702 m), weight 283 lb (128.4 kg), last menstrual period 08/01/2012, SpO2 99 %. Body mass index is 44.32 kg/m. Physical Exam Vitals signs reviewed.  Constitutional:      Appearance: Normal appearance. She is obese.  Cardiovascular:     Rate and Rhythm: Normal rate.     Pulses: Normal pulses.  Pulmonary:     Effort: Pulmonary effort is normal.     Breath sounds: Normal breath sounds.  Musculoskeletal: Normal range of motion.  Skin:    General: Skin is warm and  dry.  Neurological:     Mental Status: She is  alert and oriented to person, place, and time.  Psychiatric:        Behavior: Behavior normal.   RECENT LABS AND TESTS: BMET    Component Value Date/Time   NA 136 12/07/2018 1153   K 4.2 12/07/2018 1153   CL 101 12/07/2018 1153   CO2 20 12/07/2018 1153   GLUCOSE 89 12/07/2018 1153   GLUCOSE 96 12/02/2018 1500   BUN 15 12/07/2018 1153   CREATININE 0.92 12/07/2018 1153   CALCIUM 8.8 12/07/2018 1153   GFRNONAA 70 12/07/2018 1153   GFRAA 80 12/07/2018 1153   Lab Results  Component Value Date   HGBA1C 5.9 (A) 09/10/2018   HGBA1C 6.3 (H) 05/30/2018   HGBA1C 6.0 (A) 03/09/2018   Lab Results  Component Value Date   INSULIN 41.4 (H) 05/30/2018   CBC    Component Value Date/Time   WBC 5.8 12/07/2018 1153   WBC 5.1 12/02/2018 1500   RBC 4.68 12/07/2018 1153   RBC 4.70 12/02/2018 1500   HGB 12.7 12/07/2018 1153   HCT 37.5 12/07/2018 1153   PLT 138 (L) 12/07/2018 1153   MCV 80 12/07/2018 1153   MCH 27.1 12/07/2018 1153   MCH 27.2 12/02/2018 1500   MCHC 33.9 12/07/2018 1153   MCHC 31.5 12/02/2018 1500   RDW 13.5 12/07/2018 1153   LYMPHSABS 1.7 12/02/2018 1500   LYMPHSABS 2.4 05/30/2018 1116   MONOABS 0.3 12/02/2018 1500   EOSABS 0.0 12/02/2018 1500   EOSABS 0.1 05/30/2018 1116   BASOSABS 0.0 12/02/2018 1500   BASOSABS 0.0 05/30/2018 1116   Iron/TIBC/Ferritin/ %Sat No results found for: IRON, TIBC, FERRITIN, IRONPCTSAT Lipid Panel     Component Value Date/Time   CHOL 134 05/30/2018 1116   TRIG 85 05/30/2018 1116   HDL 75 05/30/2018 1116   CHOLHDL 1.8 05/30/2018 1116   LDLCALC 42 05/30/2018 1116   Hepatic Function Panel     Component Value Date/Time   PROT 7.5 12/07/2018 1153   ALBUMIN 3.9 12/07/2018 1153   AST 50 (H) 12/07/2018 1153   ALT 56 (H) 12/07/2018 1153   ALKPHOS 65 12/07/2018 1153   BILITOT 0.6 12/07/2018 1153      Component Value Date/Time   TSH 1.410 12/07/2018 1153   TSH 1.280 05/30/2018 1116    TSH 1.220 04/26/2018 1647   Results for Gilardi, Adrianne L (MRN [id number]) as of 02/07/2019 12:26  Ref. Range 01/23/2019 16:00  Vitamin D, 25-Hydroxy Latest Ref Range: 30.0 - 100.0 ng/mL 27.9 (L)   OBESITY BEHAVIORAL INTERVENTION VISIT  Today's visit was #12   Starting weight: 296 lbs Starting date: 05/30/2018 Today's weight: 283 lbs Today's date: 02/07/2019 Total lbs lost to date: 13   02/07/2019  Height 5\' 7"  (1.702 m)  Weight 283 lb (128.4 kg)  BMI (Calculated) 44.31  BLOOD PRESSURE - SYSTOLIC 024  BLOOD PRESSURE - DIASTOLIC 91   Body Fat % 09.7 %  Total Body Water (lbs) 89.4 lbs   ASK: We discussed the diagnosis of obesity with [name] today and Meeghan agreed to give [place] permission to discuss obesity behavioral modification therapy today.  ASSESS: Sukanya has the diagnosis of obesity and her BMI today is 44.4. Osmara is in the action stage of change.   ADVISE: Tyresa was educated on the multiple health risks of obesity as well as the benefit of weight loss to improve her health. She was advised of the need for long term treatment and the importance of lifestyle modifications to improve her  current health and to decrease her risk of future health problems.  AGREE: Multiple dietary modification options and treatment options were discussed and  Jhoanna agreed to follow the recommendations documented in the above note.  ARRANGE: Laretha was educated on the importance of frequent visits to treat obesity as outlined per CMS and USPSTF guidelines and agreed to schedule her next follow up appointment today.  Migdalia Dk, am acting as Location manager for [employer], DO  I have reviewed the above documentation for accuracy and completeness, and I agree with the above. -Jearld Lesch, DO

## 2019-02-08 LAB — INSULIN, RANDOM: INSULIN: 37.3 u[IU]/mL — ABNORMAL HIGH (ref 2.6–24.9)

## 2019-02-08 LAB — HEMOGLOBIN A1C
Est. average glucose Bld gHb Est-mCnc: 111 mg/dL
Hgb A1c MFr Bld: 5.5 % (ref 4.8–5.6)

## 2019-02-11 ENCOUNTER — Other Ambulatory Visit: Payer: Self-pay

## 2019-02-11 ENCOUNTER — Encounter: Payer: Self-pay | Admitting: Family Medicine

## 2019-02-11 ENCOUNTER — Ambulatory Visit: Payer: 59 | Admitting: Family Medicine

## 2019-02-11 VITALS — BP 136/92 | HR 74 | Temp 97.8°F | Resp 18 | Ht 67.0 in | Wt 285.8 lb

## 2019-02-11 DIAGNOSIS — F321 Major depressive disorder, single episode, moderate: Secondary | ICD-10-CM | POA: Diagnosis not present

## 2019-02-11 DIAGNOSIS — G4733 Obstructive sleep apnea (adult) (pediatric): Secondary | ICD-10-CM | POA: Diagnosis not present

## 2019-02-11 DIAGNOSIS — G4709 Other insomnia: Secondary | ICD-10-CM | POA: Diagnosis not present

## 2019-02-11 DIAGNOSIS — I10 Essential (primary) hypertension: Secondary | ICD-10-CM

## 2019-02-11 DIAGNOSIS — M8889 Osteitis deformans of multiple sites: Secondary | ICD-10-CM | POA: Diagnosis not present

## 2019-02-11 MED ORDER — TRAZODONE HCL 50 MG PO TABS: 50.0000 mg | ORAL_TABLET | Freq: Every day | ORAL | 3 refills | Status: DC

## 2019-02-11 MED FILL — traZODone HCL 50 MG TABS: 50 | 90 days supply | Qty: 180 | Fill #0

## 2019-02-11 NOTE — Patient Instructions (Signed)

## 2019-02-11 NOTE — Progress Notes (Signed)
Established Patient Office Visit  Subjective:  Patient ID: Michelle Huerta, female    DOB: 08/03/1960  Age: 58 y.o. MRN: [id number]  CC:  Chief Complaint  Patient presents with  . Hypertension    2 month f/u and discuss amlodipine   . Medication Refill    trazadone  . Depression    score :9    HPI ARCHER VISE presents for    Hypertension: Patient here for follow-up of elevated blood pressure. She is not exercising and is adherent to low salt diet.  Blood pressure is well controlled at home. Cardiac symptoms none. Patient denies none.  Cardiovascular risk factors: hypertension, obesity (BMI >= 30 kg/m2) and sedentary lifestyle. Use of agents associated with hypertension: none. History of target organ damage: none.  sHE DID NOT TAKE HER BP MEDS THIS MORNING BP Readings from Last 3 Encounters:  02/11/19 (!) 136/92  02/07/19 (!) 139/91  01/23/19 118/84    Morbid Obesity Wt Readings from Last 3 Encounters:  02/11/19 285 lb 12.8 oz (129.6 kg)  02/07/19 283 lb (128.4 kg)  01/23/19 285 lb (129.3 kg)    She states that she is doing herbal life She states that she started herbal life today and today was a shake which has some diet supplements She plans to do a meal replacement shake at lunch She is not exercising at this point but will try to add in some walking.  OSA on CPAP Insomnia She states that she has been using her cpap but is not sleeping well She uses the nasal device She just restarted using her CPAP a week ago after being off it for a month Her last visit to sleep clinic was 2 years ago She states that the Sleep Clinic is not taking appointment Trazodone used to help  Moderate depression She reports that her mood is getting somewhat  She states that she is trying to relax She tries to go out to the sweepstakes  Depression screen [facility] 2/9 02/11/2019 12/10/2018 12/07/2018 12/06/2018 12/05/2018  Decreased Interest 1 3 3 3  0  Down, Depressed, Hopeless 1 3 3 3  0  PHQ - 2  Score 2 6 6 6  0  Altered sleeping 3 3 3 3  -  Tired, decreased energy 3 3 3 3  -  Change in appetite 1 0 0 3 -  Feeling bad or failure about yourself  0 0 0 0 -  Trouble concentrating 0 0 0 0 -  Moving slowly or fidgety/restless 0 1 3 3  -  Suicidal thoughts 0 0 0 0 -  PHQ-9 Score 9 13 15 18  -  Difficult doing work/chores Somewhat difficult - - Somewhat difficult -  Some recent data might be hidden    Paget's disease She reports that she has joint pains and was diagnosed with Pagets around 2011 She states that she also has arthritis and her joint pains make it hard to exercise and she feels stiff She sees Rheumatology   CLINICAL DATA:  58 year old female with increasing lumbar back pain for 2 weeks with no known injury. Initial encounter. Paget disease.  EXAM: LUMBAR SPINE - 2-3 VIEW  COMPARISON:  Lumbar radiographs 12/25/2013, 02/01/2010.  FINDINGS: Transitional lumbosacral anatomy suspected, with full size ribs at T11, hypoplastic ribs at T12, and sacralized L5 level. Stable vertebral height and alignment since 2015, and not significantly changed since 2011. Chronic sclerosis with cortical thickening and trabecular coarsening in the visualized lower thoracic levels to L1 suggesting chronic Paget disease.  Stable disc spaces. Mild to moderate lower thoracic facet hypertrophy re- demonstrated.  Scattered sclerosis in the visible hemipelvis also suggestive of Paget. No acute osseous abnormality identified.  IMPRESSION: Evidence of chronic spinal and pelvic Paget's disease. No acute osseous abnormality identified.   Electronically Signed   By: Genevie Ann M.D.   On: 10/05/2015 16:30  Past Medical History:  Diagnosis Date  . Allergy   . Arthritis of both knees   . Bilateral chronic knee pain   . Chronic back pain   . Constipation   . DDD (degenerative disc disease), cervical   . Depression    no meds  . Genital herpes    HSV type 2 at rectum, culture positive  3/15  . Glaucoma   . Grade I diastolic dysfunction 71/69/6789   noted on ECHO   . Heart murmur   . History of colon polyps   . HLD (hyperlipidemia)   . Hypertension   . Hypothyroidism   . Insomnia   . Internal hemorrhoids   . Lactose intolerance   . Lupus ([place])   . LVH (left ventricular hypertrophy) 06/25/2015   Mild, noted on ECHO   . Mild sleep apnea    1/17 CPAP Dr Claiborne Billings does not use cpap   . Morbid obesity ([place]) 06/11/2015  . Osteoporosis   . Paget's disease   . Paget's disease of bone    Dr Trudie Reed, Reclast insufion 02/14/10 - bad reaction to infusion, now tolerates fosamax weekly  . Plantar fasciitis, bilateral   . PMB (postmenopausal bleeding)   . Pre-diabetes   . Sleep apnea   . Thrombocytopenia ([place])   . TR (tricuspid regurgitation) 06/25/2015   Trace, noted on ECHO   . Tubular adenoma polyp of rectum    8/12, Dr Fuller Plan, 5 yr follow up   . Uterine fibroid 10/19/2017   noted on CT pelvis  . Vitamin D deficiency     Past Surgical History:  Procedure Laterality Date  . AUGMENTATION MAMMAPLASTY Bilateral   . BREAST ENHANCEMENT SURGERY    . BREAST REDUCTION SURGERY    . COLONOSCOPY  01/19/2016  . DILATION AND CURETTAGE OF UTERUS N/A 06/24/2015   Procedure: DILATATION AND CURETTAGE;  Surgeon: Emily Filbert, MD;  Location: [facility];  Service: Gynecology;  Laterality: N/A;  . KNEE ARTHROSCOPY WITH SUBCHONDROPLASTY Right 05/03/2018   Procedure: RIGHT KNEE ARTHROSCOPY WITH DEBRIDEMENT, PARTIAL MEDIAL MENISCECTOMY SUBCHONDROPLASTY MEDIAL TIBIAL PLATEAU AND MEDIAL FEMORAL CONDYLE;  Surgeon: Mcarthur Rossetti, MD;  Location: [facility];  Service: Orthopedics;  Laterality: Right;  . REDUCTION MAMMAPLASTY Bilateral   . TOE SURGERY     removal of bone in each foot  . TUBAL LIGATION      Family History  Problem Relation Age of Onset  . Hypertension Mother   . Heart disease Mother   . Hypertension Father   . Hypertension Sister   . Cancer Maternal Grandmother   . Stomach  cancer Paternal Grandmother   . Hypertension Son   . Hypertension Son   . Colon cancer Neg Hx     Social History   Socioeconomic History  . Marital status: Single    Spouse name: Not on file  . Number of children: Not on file  . Years of education: Not on file  . Highest education level: Not on file  Occupational History  . Occupation: Actuary- [employer]  Social Needs  . Financial resource strain: Not on file  . Food insecurity  Worry: Not on file    Inability: Not on file  . Transportation needs    Medical: Not on file    Non-medical: Not on file  Tobacco Use  . Smoking status: Former Smoker    Years: 7.00    Types: Cigars    Quit date: 08/11/2008    Years since quitting: 10.5  . Smokeless tobacco: Never Used  . Tobacco comment: smoked [employer], 1 pack per day (5 in a pack)  Substance and Sexual Activity  . Alcohol use: Yes    Alcohol/week: 5.0 standard drinks    Types: 5 Cans of beer per week    Comment: occ   . Drug use: Never  . Sexual activity: Not Currently    Partners: Male    Birth control/protection: Surgical  Lifestyle  . Physical activity    Days per week: Not on file    Minutes per session: Not on file  . Stress: Not on file  Relationships  . Social Herbalist on phone: Not on file    Gets together: Not on file    Attends religious service: Not on file    Active member of club or organization: Not on file    Attends meetings of clubs or organizations: Not on file    Relationship status: Not on file  . Intimate partner violence    Fear of current or ex partner: Not on file    Emotionally abused: Not on file    Physically abused: Not on file    Forced sexual activity: Not on file  Other Topics Concern  . Not on file  Social History Narrative   Epworth sleepiness score as of 06/11/15 a 4   Exercise: yes, active at work, no formal exercise   Single, never married, in relationship off and on   Children: [place], [place] (both in  [place]), 2 grandkids.  Raised 2 nieces [name], [address] (they have 5 kids)   Occupation: [employer]   Religion: faith important, not active in a church   Seatbelt use: yes    Outpatient Medications Prior to Visit  Medication Sig Dispense Refill  . albuterol (VENTOLIN HFA) 108 (90 Base) MCG/ACT inhaler Inhale 2 puffs into the lungs every 6 (six) hours as needed for wheezing or shortness of breath. 1 Inhaler 0  . amLODipine (NORVASC) 5 MG tablet Take 1 tablet (5 mg total) by mouth daily. 90 tablet 3  . betamethasone dipropionate (DIPROLENE) 0.05 % cream Apply topically 2 (two) times daily. 30 g 0  . cetirizine (ZYRTEC) 10 MG tablet Take 10 mg by mouth daily.    [name] doxycycline (VIBRA-TABS) 100 MG tablet Take 1 tablet (100 mg total) by mouth 2 (two) times daily. 20 tablet 0  . Estradiol 10 MCG INST Place 10 mcg vaginally 2 (two) times a week. 12 each 1  . fluticasone (FLONASE) 50 MCG/ACT nasal spray Place 2 sprays into both nostrils daily. 16 g 6  . ibuprofen (ADVIL,MOTRIN) 800 MG tablet Take 1 tablet (800 mg total) by mouth every 8 (eight) hours as needed. 90 tablet 0  . MELATONIN PO Take by mouth.    . metFORMIN (GLUCOPHAGE) 500 MG tablet Take 1 tablet (500 mg total) by mouth 2 (two) times daily with a meal. 60 tablet 0  . misoprostol (CYTOTEC) 200 MCG tablet Cytotec 200 mcg tablet  insert one tablet per vagina at bedtime night prior to procedure    . telmisartan (MICARDIS) 80  MG tablet Take 1 tablet (80 mg total) by mouth daily. 90 tablet 3  . temazepam (RESTORIL) 30 MG capsule     . valACYclovir (VALTREX) 500 MG tablet Take 1 tablet (500 mg total) by mouth daily. 90 tablet 3  . Vitamin D, Ergocalciferol, (DRISDOL) 1.25 MG (50000 UT) CAPS capsule Take 1 capsule (50,000 Units total) by mouth every 7 (seven) days. 4 capsule 0  . traZODone (DESYREL) 50 MG tablet Take 1 tablet (50 mg total) by mouth at bedtime. 30 tablet 3   No facility-administered medications prior to  visit.     Allergies  Allergen Reactions  . Penicillins Rash and Other (See Comments)    Has patient had a PCN reaction causing immediate rash, facial/tongue/throat swelling, SOB or lightheadedness with hypotension: Yes Has patient had a PCN reaction causing severe rash involving mucus membranes or skin necrosis: No Has patient had a PCN reaction that required hospitalization: No Has patient had a PCN reaction occurring within the last 10 years: No If all of the above answers are "NO", then may proceed with Cephalosporin use.   [name] Reclast [Zoledronic Acid] Nausea And Vomiting    ROS Review of Systems Review of Systems  Constitutional: Negative for activity change, appetite change, chills and fever.  HENT: Negative for congestion, nosebleeds, trouble swallowing and voice change.   Respiratory: Negative for cough, shortness of breath and wheezing.   Gastrointestinal: Negative for diarrhea, nausea and vomiting.  Genitourinary: Negative for difficulty urinating, dysuria, flank pain and hematuria.  Musculoskeletal: Negative for back pain, joint swelling and neck pain.  Neurological: Negative for dizziness, speech difficulty, light-headedness and numbness.  See HPI. All other review of systems negative.     Objective:    Physical Exam  BP (!) 136/92 (BP Location: Right Arm, Patient Position: Sitting, Cuff Size: Large) Comment: no bp med this morning per pt and just drank a herbal tea  Pulse 74   Temp 97.8 F (36.6 C) (Oral)   Resp 18   Ht 5\' 7"  (1.702 m)   Wt 285 lb 12.8 oz (129.6 kg)   LMP 08/01/2012   SpO2 98%   BMI 44.76 kg/m  Wt Readings from Last 3 Encounters:  02/11/19 285 lb 12.8 oz (129.6 kg)  02/07/19 283 lb (128.4 kg)  01/23/19 285 lb (129.3 kg)   Physical Exam  Constitutional: Oriented to person, place, and time. Appears well-developed and well-nourished.  HENT:  Head: Normocephalic and atraumatic.  Eyes: Conjunctivae and EOM are normal.  Cardiovascular:  Normal rate, regular rhythm, normal heart sounds and intact distal pulses.  No murmur heard. Pulmonary/Chest: Effort normal and breath sounds normal. No stridor. No respiratory distress. Has no wheezes.  Neurological: Is alert and oriented to person, place, and time.  Skin: Skin is warm. Capillary refill takes less than 2 seconds.  Psychiatric: Has a normal mood and affect. Behavior is normal. Judgment and thought content normal.    Health Maintenance Due  Topic Date Due  . INFLUENZA VACCINE  02/09/2019    There are no preventive care reminders to display for this patient.  Lab Results  Component Value Date   TSH 1.410 12/07/2018   Lab Results  Component Value Date   WBC 5.8 12/07/2018   HGB 12.7 12/07/2018   HCT 37.5 12/07/2018   MCV 80 12/07/2018   PLT 138 (L) 12/07/2018   Lab Results  Component Value Date   NA 136 12/07/2018   K 4.2 12/07/2018   CO2 20 12/07/2018  GLUCOSE 89 12/07/2018   BUN 15 12/07/2018   CREATININE 0.92 12/07/2018   BILITOT 0.6 12/07/2018   ALKPHOS 65 12/07/2018   AST 50 (H) 12/07/2018   ALT 56 (H) 12/07/2018   PROT 7.5 12/07/2018   ALBUMIN 3.9 12/07/2018   CALCIUM 8.8 12/07/2018   ANIONGAP 8 12/02/2018   Lab Results  Component Value Date   CHOL 134 05/30/2018   Lab Results  Component Value Date   HDL 75 05/30/2018   Lab Results  Component Value Date   LDLCALC 42 05/30/2018   Lab Results  Component Value Date   TRIG 85 05/30/2018   Lab Results  Component Value Date   CHOLHDL 1.8 05/30/2018   Lab Results  Component Value Date   HGBA1C 5.5 02/07/2019      Assessment & Plan:   Problem List Items Addressed This Visit      Cardiovascular and Mediastinum   Essential hypertension - Primary  - Patient's blood pressure is at goal of 139/89 or less. Condition is stable. Continue current medications and treatment plan. I recommend that you exercise for 30-45 minutes 5 days a week. I also recommend a balanced diet with fruits  and vegetables every day, lean meats, and little fried foods. The DASH diet (you can find this online) is a good example of this.      Other   Morbid obesity ([place])  -  Discussed calorie restriction, discussed improving sleep as poor sleep worsening obesity   Other insomnia  -  Advised to call Neurology, resume cpap    Other Visit Diagnoses    OSA (obstructive sleep apnea)    -  Advised to call sleep clinic to see if she can get her cpap titrated   Paget's disease of bone at multiple sites    -  Continue rheumatology    Moderate depression - improved compared to previous  Meds ordered this encounter  Medications  . traZODone (DESYREL) 50 MG tablet    Sig: Take 1-2 tablets (50-100 mg total) by mouth at bedtime.    Dispense:  180 tablet    Refill:  3    Follow-up: Return in about 6 months (around 08/14/2019) for hypertension .    Forrest Moron, MD

## 2019-02-21 ENCOUNTER — Ambulatory Visit (INDEPENDENT_AMBULATORY_CARE_PROVIDER_SITE_OTHER): Payer: 59 | Admitting: Bariatrics

## 2019-02-21 ENCOUNTER — Encounter (INDEPENDENT_AMBULATORY_CARE_PROVIDER_SITE_OTHER): Payer: Self-pay | Admitting: Bariatrics

## 2019-02-21 ENCOUNTER — Other Ambulatory Visit: Payer: Self-pay

## 2019-02-21 VITALS — BP 119/82 | HR 74 | Temp 97.9°F | Ht 67.0 in | Wt 278.0 lb

## 2019-02-21 DIAGNOSIS — E8881 Metabolic syndrome: Secondary | ICD-10-CM | POA: Diagnosis not present

## 2019-02-21 DIAGNOSIS — I10 Essential (primary) hypertension: Secondary | ICD-10-CM | POA: Diagnosis not present

## 2019-02-21 DIAGNOSIS — E559 Vitamin D deficiency, unspecified: Secondary | ICD-10-CM | POA: Diagnosis not present

## 2019-02-21 DIAGNOSIS — Z6841 Body Mass Index (BMI) 40.0 and over, adult: Secondary | ICD-10-CM

## 2019-02-22 DIAGNOSIS — L930 Discoid lupus erythematosus: Secondary | ICD-10-CM | POA: Insufficient documentation

## 2019-02-22 DIAGNOSIS — M545 Low back pain: Secondary | ICD-10-CM | POA: Diagnosis not present

## 2019-02-22 DIAGNOSIS — M546 Pain in thoracic spine: Secondary | ICD-10-CM | POA: Diagnosis not present

## 2019-02-22 DIAGNOSIS — M1990 Unspecified osteoarthritis, unspecified site: Secondary | ICD-10-CM | POA: Insufficient documentation

## 2019-02-22 DIAGNOSIS — M88859 Osteitis deformans of unspecified thigh: Secondary | ICD-10-CM | POA: Insufficient documentation

## 2019-02-22 DIAGNOSIS — Z6841 Body Mass Index (BMI) 40.0 and over, adult: Secondary | ICD-10-CM | POA: Diagnosis not present

## 2019-02-26 ENCOUNTER — Telehealth: Payer: Self-pay | Admitting: Family Medicine

## 2019-02-26 NOTE — Telephone Encounter (Signed)
Patient called stating that she accidentally took off a strap on the knee brace that was not suppose to come off and wanted to know if you knew or could get in touch with the person that fitted her with the brace was going to be in our office this week so she could bring the brace back to our office and get fitted with a new one.  [phone].  Thank you.

## 2019-02-26 NOTE — Telephone Encounter (Signed)
I called the patient and gave her Ryan's number (with [facility]) so she may call him directly to set up a time with him to fix her brace.

## 2019-02-26 NOTE — Progress Notes (Signed)
Office: [phone]  /  Fax: [phone]   HPI:   Chief Complaint: OBESITY Michelle Huerta is here to discuss her progress with her obesity treatment plan. She is on the Category 3 plan and is following her eating plan approximately 30 % of the time. She states she is exercising 0 minutes 0 times per week. Michelle Huerta is down five pounds since last week and she is doing well overall. She is drinking more water. Michelle Huerta will see the orthopedist on September 15th for her right knee. Her weight is 278 lb (126.1 kg) today and has had a weight loss of 5 pounds over a period of 2 weeks since her last visit. She has lost 18 lbs since starting treatment with [place].  Hypertension Michelle Huerta is a 58 y.o. female with hypertension. Michelle Huerta denies chest pain or shortness of breath on exertion. She is working weight loss to help control her blood pressure with the goal of decreasing her risk of heart attack and stroke. Michelle Huerta blood pressure is well controlled.  Vitamin D deficiency Michelle Huerta has a diagnosis of vitamin D deficiency. Her last vitamin D level was at 27.9 She is currently taking vit D and denies nausea, vomiting or muscle weakness.  Insulin Resistance Michelle Huerta has a diagnosis of insulin resistance based on her elevated fasting insulin level >5. Although Michelle Huerta's blood glucose readings are still under good control, insulin resistance puts her at greater risk of metabolic syndrome and diabetes. Her last A1c was at 5.5 and last insulin level was at 37.3 She is taking metformin currently and continues to work on diet and exercise to decrease risk of diabetes.  ASSESSMENT AND PLAN:  Essential hypertension  Vitamin D deficiency  Insulin resistance  Class 3 severe obesity with serious comorbidity and body mass index (BMI) of 40.0 to 44.9 in adult, unspecified obesity type ([place])  PLAN:  Hypertension We discussed sodium restriction, working on healthy weight loss, and a regular exercise program as the means to  achieve improved blood pressure control. Michelle Huerta agreed with this plan and agreed to follow up as directed. We will continue to monitor her blood pressure as well as her progress with the above lifestyle modifications. She will continue her medications as prescribed and will watch for signs of hypotension as she continues her lifestyle modifications.  Vitamin D Deficiency Michelle Huerta was informed that low vitamin D levels contributes to fatigue and are associated with obesity, breast, and colon cancer. She agrees to continue to take prescription Vit D @50 ,000 IU every week and will follow up for routine testing of vitamin D, at least 2-3 times per year. She was informed of the risk of over-replacement of vitamin D and agrees to not increase her dose unless she discusses this with [place] first.  Insulin Resistance Michelle Huerta will continue to work on weight loss, exercise, increasing lean protein and decreasing simple carbohydrates in her diet to help decrease the risk of diabetes. We dicussed metformin including benefits and risks. She was informed that eating too many simple carbohydrates or too many calories at one sitting increases the likelihood of GI side effects. Michelle Huerta will metformin for now and prescription was not written today. Michelle Huerta agreed to follow up with [place] as directed to monitor her progress.  I spent > than 50% of the 15 minute visit on counseling as documented in the note.  Obesity Michelle Huerta is currently in the action stage of change. As such, her goal is to continue with weight loss efforts She  has agreed to follow the Category 3 plan Michelle Huerta has been instructed to work up to a goal of 150 minutes of combined cardio and strengthening exercise per week for weight loss and overall health benefits. We discussed the following Behavioral Modification Strategies today: increase H2O intake, no skipping meals, keeping healthy foods in the home, increasing lean protein intake, decreasing simple carbohydrates, increasing  vegetables, decrease eating out, work on meal planning and intentional eating and celebration eating strategies Michelle Huerta will discuss knee replacement with the orthopedist.   Michelle Huerta has agreed to follow up with our clinic in 2 weeks. She was informed of the importance of frequent follow up visits to maximize her success with intensive lifestyle modifications for her multiple health conditions.  ALLERGIES: Allergies  Allergen Reactions  . Penicillins Rash and Other (See Comments)    Has patient had a PCN reaction causing immediate rash, facial/tongue/throat swelling, SOB or lightheadedness with hypotension: Yes Has patient had a PCN reaction causing severe rash involving mucus membranes or skin necrosis: No Has patient had a PCN reaction that required hospitalization: No Has patient had a PCN reaction occurring within the last 10 years: No If all of the above answers are "NO", then may proceed with Cephalosporin use.   [name] Reclast [Zoledronic Acid] Nausea And Vomiting    MEDICATIONS: Current Outpatient Medications on File Prior to Visit  Medication Sig Dispense Refill  . albuterol (VENTOLIN HFA) 108 (90 Base) MCG/ACT inhaler Inhale 2 puffs into the lungs every 6 (six) hours as needed for wheezing or shortness of breath. 1 Inhaler 0  . amLODipine (NORVASC) 5 MG tablet Take 1 tablet (5 mg total) by mouth daily. 90 tablet 3  . betamethasone dipropionate (DIPROLENE) 0.05 % cream Apply topically 2 (two) times daily. 30 g 0  . cetirizine (ZYRTEC) 10 MG tablet Take 10 mg by mouth daily.    [name] doxycycline (VIBRA-TABS) 100 MG tablet Take 1 tablet (100 mg total) by mouth 2 (two) times daily. 20 tablet 0  . Estradiol 10 MCG INST Place 10 mcg vaginally 2 (two) times a week. 12 each 1  . fluticasone (FLONASE) 50 MCG/ACT nasal spray Place 2 sprays into both nostrils daily. 16 g 6  . ibuprofen (ADVIL,MOTRIN) 800 MG tablet Take 1 tablet (800 mg total) by mouth every 8 (eight) hours as needed. 90 tablet 0  .  MELATONIN PO Take by mouth.    . metFORMIN (GLUCOPHAGE) 500 MG tablet Take 1 tablet (500 mg total) by mouth 2 (two) times daily with a meal. 60 tablet 0  . misoprostol (CYTOTEC) 200 MCG tablet Cytotec 200 mcg tablet  insert one tablet per vagina at bedtime night prior to procedure    . telmisartan (MICARDIS) 80 MG tablet Take 1 tablet (80 mg total) by mouth daily. 90 tablet 3  . temazepam (RESTORIL) 30 MG capsule     . traZODone (DESYREL) 50 MG tablet Take 1-2 tablets (50-100 mg total) by mouth at bedtime. 180 tablet 3  . valACYclovir (VALTREX) 500 MG tablet Take 1 tablet (500 mg total) by mouth daily. 90 tablet 3  . Vitamin D, Ergocalciferol, (DRISDOL) 1.25 MG (50000 UT) CAPS capsule Take 1 capsule (50,000 Units total) by mouth every 7 (seven) days. 4 capsule 0   No current facility-administered medications on file prior to visit.     PAST MEDICAL HISTORY: Past Medical History:  Diagnosis Date  . Allergy   . Arthritis of both knees   . Bilateral chronic knee pain   .  Chronic back pain   . Constipation   . DDD (degenerative disc disease), cervical   . Depression    no meds  . Genital herpes    HSV type 2 at rectum, culture positive 3/15  . Glaucoma   . Grade I diastolic dysfunction 41/93/7902   noted on ECHO   . Heart murmur   . History of colon polyps   . HLD (hyperlipidemia)   . Hypertension   . Hypothyroidism   . Insomnia   . Internal hemorrhoids   . Lactose intolerance   . Lupus ([place])   . LVH (left ventricular hypertrophy) 06/25/2015   Mild, noted on ECHO   . Mild sleep apnea    1/17 CPAP Dr Claiborne Billings does not use cpap   . Morbid obesity ([place]) 06/11/2015  . Osteoporosis   . Paget's disease   . Paget's disease of bone    Dr Trudie Reed, Reclast insufion 02/14/10 - bad reaction to infusion, now tolerates fosamax weekly  . Plantar fasciitis, bilateral   . PMB (postmenopausal bleeding)   . Pre-diabetes   . Sleep apnea   . Thrombocytopenia ([place])   . TR (tricuspid regurgitation)  06/25/2015   Trace, noted on ECHO   . Tubular adenoma polyp of rectum    8/12, Dr Fuller Plan, 5 yr follow up   . Uterine fibroid 10/19/2017   noted on CT pelvis  . Vitamin D deficiency     PAST SURGICAL HISTORY: Past Surgical History:  Procedure Laterality Date  . AUGMENTATION MAMMAPLASTY Bilateral   . BREAST ENHANCEMENT SURGERY    . BREAST REDUCTION SURGERY    . COLONOSCOPY  01/19/2016  . DILATION AND CURETTAGE OF UTERUS N/A 06/24/2015   Procedure: DILATATION AND CURETTAGE;  Surgeon: Emily Filbert, MD;  Location: [facility];  Service: Gynecology;  Laterality: N/A;  . KNEE ARTHROSCOPY WITH SUBCHONDROPLASTY Right 05/03/2018   Procedure: RIGHT KNEE ARTHROSCOPY WITH DEBRIDEMENT, PARTIAL MEDIAL MENISCECTOMY SUBCHONDROPLASTY MEDIAL TIBIAL PLATEAU AND MEDIAL FEMORAL CONDYLE;  Surgeon: Mcarthur Rossetti, MD;  Location: [facility];  Service: Orthopedics;  Laterality: Right;  . REDUCTION MAMMAPLASTY Bilateral   . TOE SURGERY     removal of bone in each foot  . TUBAL LIGATION      SOCIAL HISTORY: Social History   Tobacco Use  . Smoking status: Former Smoker    Years: 7.00    Types: Cigars    Quit date: 08/11/2008    Years since quitting: 10.5  . Smokeless tobacco: Never Used  . Tobacco comment: smoked [employer], 1 pack per day (5 in a pack)  Substance Use Topics  . Alcohol use: Yes    Alcohol/week: 5.0 standard drinks    Types: 5 Cans of beer per week    Comment: occ   . Drug use: Never    FAMILY HISTORY: Family History  Problem Relation Age of Onset  . Hypertension Mother   . Heart disease Mother   . Hypertension Father   . Hypertension Sister   . Cancer Maternal Grandmother   . Stomach cancer Paternal Grandmother   . Hypertension Son   . Hypertension Son   . Colon cancer Neg Hx     ROS: Review of Systems  Constitutional: Positive for weight loss.  Respiratory: Negative for shortness of breath (on exertion).   Cardiovascular: Negative for chest pain.  Gastrointestinal:  Negative for nausea and vomiting.  Musculoskeletal:       Negative for muscle weakness    PHYSICAL EXAM: Blood pressure 119/82,  pulse 74, temperature 97.9 F (36.6 C), temperature source Oral, height 5\' 7"  (1.702 m), weight 278 lb (126.1 kg), last menstrual period 08/01/2012, SpO2 98 %. Body mass index is 43.54 kg/m. Physical Exam Vitals signs reviewed.  Constitutional:      Appearance: Normal appearance. She is well-developed. She is obese.  Cardiovascular:     Rate and Rhythm: Normal rate.  Pulmonary:     Effort: Pulmonary effort is normal.  Musculoskeletal: Normal range of motion.  Skin:    General: Skin is warm and dry.  Neurological:     Mental Status: She is alert and oriented to person, place, and time.  Psychiatric:        Mood and Affect: Mood normal.        Behavior: Behavior normal.     RECENT LABS AND TESTS: BMET    Component Value Date/Time   NA 136 12/07/2018 1153   K 4.2 12/07/2018 1153   CL 101 12/07/2018 1153   CO2 20 12/07/2018 1153   GLUCOSE 89 12/07/2018 1153   GLUCOSE 96 12/02/2018 1500   BUN 15 12/07/2018 1153   CREATININE 0.92 12/07/2018 1153   CALCIUM 8.8 12/07/2018 1153   GFRNONAA 70 12/07/2018 1153   GFRAA 80 12/07/2018 1153   Lab Results  Component Value Date   HGBA1C 5.5 02/07/2019   HGBA1C 5.9 (A) 09/10/2018   HGBA1C 6.3 (H) 05/30/2018   HGBA1C 6.0 (A) 03/09/2018   Lab Results  Component Value Date   INSULIN 37.3 (H) 02/07/2019   INSULIN 41.4 (H) 05/30/2018   CBC    Component Value Date/Time   WBC 5.8 12/07/2018 1153   WBC 5.1 12/02/2018 1500   RBC 4.68 12/07/2018 1153   RBC 4.70 12/02/2018 1500   HGB 12.7 12/07/2018 1153   HCT 37.5 12/07/2018 1153   PLT 138 (L) 12/07/2018 1153   MCV 80 12/07/2018 1153   MCH 27.1 12/07/2018 1153   MCH 27.2 12/02/2018 1500   MCHC 33.9 12/07/2018 1153   MCHC 31.5 12/02/2018 1500   RDW 13.5 12/07/2018 1153   LYMPHSABS 1.7 12/02/2018 1500   LYMPHSABS 2.4 05/30/2018 1116   MONOABS 0.3  12/02/2018 1500   EOSABS 0.0 12/02/2018 1500   EOSABS 0.1 05/30/2018 1116   BASOSABS 0.0 12/02/2018 1500   BASOSABS 0.0 05/30/2018 1116   Iron/TIBC/Ferritin/ %Sat No results found for: IRON, TIBC, FERRITIN, IRONPCTSAT Lipid Panel     Component Value Date/Time   CHOL 134 05/30/2018 1116   TRIG 85 05/30/2018 1116   HDL 75 05/30/2018 1116   CHOLHDL 1.8 05/30/2018 1116   LDLCALC 42 05/30/2018 1116   Hepatic Function Panel     Component Value Date/Time   PROT 7.5 12/07/2018 1153   ALBUMIN 3.9 12/07/2018 1153   AST 50 (H) 12/07/2018 1153   ALT 56 (H) 12/07/2018 1153   ALKPHOS 65 12/07/2018 1153   BILITOT 0.6 12/07/2018 1153      Component Value Date/Time   TSH 1.410 12/07/2018 1153   TSH 1.280 05/30/2018 1116   TSH 1.220 04/26/2018 1647     Ref. Range 01/23/2019 16:00  Vitamin D, 25-Hydroxy Latest Ref Range: 30.0 - 100.0 ng/mL 27.9 (L)    OBESITY BEHAVIORAL INTERVENTION VISIT  Today's visit was # 12   Starting weight: 296 lbs Starting date: 05/30/2018 Today's weight : 278 lbs Today's date: 02/21/2019 Total lbs lost to date: 18    02/21/2019  Height 5\' 7"  (1.702 m)  Weight 278 lb (126.1 kg)  BMI (  Calculated) 43.53  BLOOD PRESSURE - SYSTOLIC 242  BLOOD PRESSURE - DIASTOLIC 82   Body Fat % 35.3 %  Total Body Water (lbs) 85 lbs    ASK: We discussed the diagnosis of obesity with Michelle Huerta today and Presley agreed to give [place] permission to discuss obesity behavioral modification therapy today.  ASSESS: Suann has the diagnosis of obesity and her BMI today is 43.53 Kathrynn is in the action stage of change   ADVISE: Kaileigh was educated on the multiple health risks of obesity as well as the benefit of weight loss to improve her health. She was advised of the need for long term treatment and the importance of lifestyle modifications to improve her current health and to decrease her risk of future health problems.  AGREE: Multiple dietary modification options and treatment  options were discussed and  Johneisha agreed to follow the recommendations documented in the above note.  ARRANGE: Berdell was educated on the importance of frequent visits to treat obesity as outlined per CMS and USPSTF guidelines and agreed to schedule her next follow up appointment today.  Corey Skains, am acting as Location manager for [employer]. Owens Shark, DO  I have reviewed the above documentation for accuracy and completeness, and I agree with the above. -Jearld Lesch, DO

## 2019-02-27 ENCOUNTER — Encounter (INDEPENDENT_AMBULATORY_CARE_PROVIDER_SITE_OTHER): Payer: Self-pay | Admitting: Bariatrics

## 2019-02-27 MED FILL — VALACYCLOVIR HCL 500 MG TAB: 500 | 90 days supply | Qty: 90 | Fill #0

## 2019-02-27 MED FILL — TELMISARTAN 80 MG TABS: 80 | 90 days supply | Qty: 90 | Fill #0

## 2019-02-27 MED FILL — AMLODIPINE BESYLATE 5 MG TA: 5 | 90 days supply | Qty: 90 | Fill #0

## 2019-03-02 DIAGNOSIS — M546 Pain in thoracic spine: Secondary | ICD-10-CM | POA: Diagnosis not present

## 2019-03-02 DIAGNOSIS — M545 Low back pain: Secondary | ICD-10-CM | POA: Diagnosis not present

## 2019-03-06 ENCOUNTER — Other Ambulatory Visit: Payer: Self-pay

## 2019-03-06 ENCOUNTER — Encounter (INDEPENDENT_AMBULATORY_CARE_PROVIDER_SITE_OTHER): Payer: Self-pay | Admitting: Bariatrics

## 2019-03-06 ENCOUNTER — Ambulatory Visit (INDEPENDENT_AMBULATORY_CARE_PROVIDER_SITE_OTHER): Payer: 59 | Admitting: Bariatrics

## 2019-03-06 VITALS — BP 114/79 | HR 87 | Temp 98.2°F | Ht 67.0 in | Wt 279.0 lb

## 2019-03-06 DIAGNOSIS — Z9189 Other specified personal risk factors, not elsewhere classified: Secondary | ICD-10-CM | POA: Diagnosis not present

## 2019-03-06 DIAGNOSIS — E88819 Insulin resistance, unspecified: Secondary | ICD-10-CM

## 2019-03-06 DIAGNOSIS — E559 Vitamin D deficiency, unspecified: Secondary | ICD-10-CM | POA: Diagnosis not present

## 2019-03-06 DIAGNOSIS — Z6841 Body Mass Index (BMI) 40.0 and over, adult: Secondary | ICD-10-CM

## 2019-03-06 DIAGNOSIS — I10 Essential (primary) hypertension: Secondary | ICD-10-CM | POA: Diagnosis not present

## 2019-03-06 DIAGNOSIS — E8881 Metabolic syndrome: Secondary | ICD-10-CM | POA: Diagnosis not present

## 2019-03-06 MED ORDER — METFORMIN HCL 500 MG PO TABS: 500.0000 mg | ORAL_TABLET | Freq: Two times a day (BID) | ORAL | 0 refills | Status: DC

## 2019-03-06 MED ORDER — VITAMIN D (ERGOCALCIFEROL) 1.25 MG (50000 UNIT) PO CAPS: 50000.0000 [IU] | ORAL_CAPSULE | ORAL | 0 refills | Status: DC

## 2019-03-06 MED FILL — VIT D2 1.25 MG (50,000 UNIT: 1.25 MG | 28 days supply | Qty: 4 | Fill #0

## 2019-03-06 MED FILL — metFORMIN HCL 500 MG TABS: 500 | 30 days supply | Qty: 60 | Fill #0

## 2019-03-07 ENCOUNTER — Encounter (INDEPENDENT_AMBULATORY_CARE_PROVIDER_SITE_OTHER): Payer: Self-pay | Admitting: Bariatrics

## 2019-03-07 NOTE — Progress Notes (Signed)
Office: [phone]  /  Fax: [phone]   HPI:   Chief Complaint: OBESITY Michelle Huerta is here to discuss her progress with her obesity treatment plan. She is on the  follow the Category 3 plan and is following her eating plan approximately 30% of the time. She states she is exercising 0 minutes 0 times per week. Michelle Huerta is up 1 lb. She needs to lose weight so that she can have orthopedic surgery. She states she had more snacks and reports she is drinking adequate water.  Her weight is 279 lb (126.6 kg) today and has had a weight gain of 1 lb since her last visit. She has lost 17 lbs since starting treatment with [place].  Hypertension Michelle Huerta is a 58 y.o. female with hypertension, which is well controlled.  Michelle Huerta denies lightheadedness. She is working weight loss to help control her blood pressure with the goal of decreasing her risk of heart attack and stroke.  Insulin Resistance Michelle Huerta has a diagnosis of insulin resistance based on her elevated fasting insulin level >5. Although Michelle Huerta's blood glucose readings are still under good control, insulin resistance puts her at greater risk of metabolic syndrome and diabetes. She is not taking metformin currently and continues to work on diet and exercise to decrease risk of diabetes. No polyphagia.  At risk for diabetes Michelle Huerta is at higher than average risk for developing diabetes due to her obesity. She currently denies polyuria or polydipsia.  Vitamin D deficiency Michelle Huerta has a diagnosis of Vitamin D deficiency. She is currently taking Vit D and denies nausea, vomiting or muscle weakness.  ASSESSMENT AND PLAN:  Essential hypertension  Insulin resistance - Plan: metFORMIN (GLUCOPHAGE) 500 MG tablet  Vitamin D deficiency - Plan: Vitamin D, Ergocalciferol, (DRISDOL) 1.25 MG (50000 UT) CAPS capsule  At risk for diabetes mellitus  Class 3 severe obesity with serious comorbidity and body mass index (BMI) of 40.0 to 44.9 in adult, unspecified  obesity type ([place])  PLAN:  Hypertension We discussed sodium restriction, working on healthy weight loss, and a regular exercise program as the means to achieve improved blood pressure control. Michelle Huerta agreed with this plan and agreed to follow up as directed. We will continue to monitor her blood pressure as well as her progress with the above lifestyle modifications. She will continue her medications as prescribed and will watch for signs of hypotension as she continues her lifestyle modifications.  Insulin Resistance Michelle Huerta will continue to work on weight loss, exercise, and decreasing simple carbohydrates in her diet to help decrease the risk of diabetes. We dicussed metformin including benefits and risks. She was informed that eating too many simple carbohydrates or too many calories at one sitting increases the likelihood of GI side effects. Michelle Huerta was given a prescription for metformin 500 mg 1 BID with each meal #60 with 0 refills. She agrees to follow-up with our clinic in 3 weeks.  Diabetes risk counseling Michelle Huerta was given extended (15 minutes) diabetes prevention counseling today. She is 58 y.o. female and has risk factors for diabetes including obesity. We discussed intensive lifestyle modifications today with an emphasis on weight loss as well as increasing exercise and decreasing simple carbohydrates in her diet.  Vitamin D Deficiency Michelle Huerta was informed that low Vitamin D levels contributes to fatigue and are associated with obesity, breast, and colon cancer. She agrees to continue to take prescription Vit D @ 50,000 IU every week #4 with 0 refills and will follow-up for routine  testing of Vitamin D, at least 2-3 times per year. She was informed of the risk of over-replacement of Vitamin D and agrees to not increase her dose unless she discusses this with [place] first. Michelle Huerta agrees to follow-up with our clinic in 3 weeks.  Obesity Michelle Huerta is currently in the action stage of change. As such, her goal is  to continue with weight loss efforts. She has agreed to follow the Category 3 plan. Michelle Huerta will work on meal planning, will adhere closely to the plan, weigh her meat, and decrease snacks. Michelle Huerta has been instructed to increase her walking (knee brace) for weight loss and overall health benefits. We discussed the following Behavioral Modification Strategies today: increasing lean protein intake, decreasing simple carbohydrates, increasing vegetables, increase H20 intake, decrease eating out, no skipping meals, work on meal planning and easy cooking plans, keeping healthy foods in the home, and planning for success.  Michelle Huerta has agreed to follow-up with our clinic in 3 weeks. She was informed of the importance of frequent follow-up visits to maximize her success with intensive lifestyle modifications for her multiple health conditions.  ALLERGIES: Allergies  Allergen Reactions  . Penicillins Rash and Other (See Comments)    Has patient had a PCN reaction causing immediate rash, facial/tongue/throat swelling, SOB or lightheadedness with hypotension: Yes Has patient had a PCN reaction causing severe rash involving mucus membranes or skin necrosis: No Has patient had a PCN reaction that required hospitalization: No Has patient had a PCN reaction occurring within the last 10 years: No If all of the above answers are "NO", then may proceed with Cephalosporin use.   [name] Reclast [Zoledronic Acid] Nausea And Vomiting    MEDICATIONS: Current Outpatient Medications on File Prior to Visit  Medication Sig Dispense Refill  . albuterol (VENTOLIN HFA) 108 (90 Base) MCG/ACT inhaler Inhale 2 puffs into the lungs every 6 (six) hours as needed for wheezing or shortness of breath. 1 Inhaler 0  . amLODipine (NORVASC) 5 MG tablet Take 1 tablet (5 mg total) by mouth daily. 90 tablet 3  . betamethasone dipropionate (DIPROLENE) 0.05 % cream Apply topically 2 (two) times daily. 30 g 0  . cetirizine (ZYRTEC) 10 MG tablet Take  10 mg by mouth daily.    [name] doxycycline (VIBRA-TABS) 100 MG tablet Take 1 tablet (100 mg total) by mouth 2 (two) times daily. 20 tablet 0  . Estradiol 10 MCG INST Place 10 mcg vaginally 2 (two) times a week. 12 each 1  . fluticasone (FLONASE) 50 MCG/ACT nasal spray Place 2 sprays into both nostrils daily. 16 g 6  . ibuprofen (ADVIL,MOTRIN) 800 MG tablet Take 1 tablet (800 mg total) by mouth every 8 (eight) hours as needed. 90 tablet 0  . MELATONIN PO Take by mouth.    . misoprostol (CYTOTEC) 200 MCG tablet Cytotec 200 mcg tablet  insert one tablet per vagina at bedtime night prior to procedure    . telmisartan (MICARDIS) 80 MG tablet Take 1 tablet (80 mg total) by mouth daily. 90 tablet 3  . temazepam (RESTORIL) 30 MG capsule     . traZODone (DESYREL) 50 MG tablet Take 1-2 tablets (50-100 mg total) by mouth at bedtime. 180 tablet 3  . valACYclovir (VALTREX) 500 MG tablet Take 1 tablet (500 mg total) by mouth daily. 90 tablet 3   No current facility-administered medications on file prior to visit.     PAST MEDICAL HISTORY: Past Medical History:  Diagnosis Date  . Allergy   .  Arthritis of both knees   . Bilateral chronic knee pain   . Chronic back pain   . Constipation   . DDD (degenerative disc disease), cervical   . Depression    no meds  . Genital herpes    HSV type 2 at rectum, culture positive 3/15  . Glaucoma   . Grade I diastolic dysfunction [id number]   noted on ECHO   . Heart murmur   . History of colon polyps   . HLD (hyperlipidemia)   . Hypertension   . Hypothyroidism   . Insomnia   . Internal hemorrhoids   . Lactose intolerance   . Lupus ([place])   . LVH (left ventricular hypertrophy) 06/25/2015   Mild, noted on ECHO   . Mild sleep apnea    1/17 CPAP Dr Claiborne Billings does not use cpap   . Morbid obesity ([place]) 06/11/2015  . Osteoporosis   . Paget's disease   . Paget's disease of bone    Dr Trudie Reed, Reclast insufion 02/14/10 - bad reaction to infusion, now tolerates fosamax  weekly  . Plantar fasciitis, bilateral   . PMB (postmenopausal bleeding)   . Pre-diabetes   . Sleep apnea   . Thrombocytopenia ([place])   . TR (tricuspid regurgitation) 06/25/2015   Trace, noted on ECHO   . Tubular adenoma polyp of rectum    8/12, Dr Fuller Plan, 5 yr follow up   . Uterine fibroid 10/19/2017   noted on CT pelvis  . Vitamin D deficiency     PAST SURGICAL HISTORY: Past Surgical History:  Procedure Laterality Date  . AUGMENTATION MAMMAPLASTY Bilateral   . BREAST ENHANCEMENT SURGERY    . BREAST REDUCTION SURGERY    . COLONOSCOPY  01/19/2016  . DILATION AND CURETTAGE OF UTERUS N/A 06/24/2015   Procedure: DILATATION AND CURETTAGE;  Surgeon: Emily Filbert, MD;  Location: [facility];  Service: Gynecology;  Laterality: N/A;  . KNEE ARTHROSCOPY WITH SUBCHONDROPLASTY Right 05/03/2018   Procedure: RIGHT KNEE ARTHROSCOPY WITH DEBRIDEMENT, PARTIAL MEDIAL MENISCECTOMY SUBCHONDROPLASTY MEDIAL TIBIAL PLATEAU AND MEDIAL FEMORAL CONDYLE;  Surgeon: Mcarthur Rossetti, MD;  Location: [facility];  Service: Orthopedics;  Laterality: Right;  . REDUCTION MAMMAPLASTY Bilateral   . TOE SURGERY     removal of bone in each foot  . TUBAL LIGATION      SOCIAL HISTORY: Social History   Tobacco Use  . Smoking status: Former Smoker    Years: 7.00    Types: Cigars    Quit date: 08/11/2008    Years since quitting: 10.5  . Smokeless tobacco: Never Used  . Tobacco comment: smoked Black&Milds, 1 pack per day (5 in a pack)  Substance Use Topics  . Alcohol use: Yes    Alcohol/week: 5.0 standard drinks    Types: 5 Cans of beer per week    Comment: occ   . Drug use: Never    FAMILY HISTORY: Family History  Problem Relation Age of Onset  . Hypertension Mother   . Heart disease Mother   . Hypertension Father   . Hypertension Sister   . Cancer Maternal Grandmother   . Stomach cancer Paternal Grandmother   . Hypertension Son   . Hypertension Son   . Colon cancer Neg Hx    ROS: Review of Systems   Gastrointestinal: Negative for nausea and vomiting.  Musculoskeletal:       Negative for muscle weakness.  Neurological:       Negative for lightheadedness.  Endo/Heme/Allergies:  Negative for polyphagia.   PHYSICAL EXAM: Blood pressure 114/79, pulse 87, temperature 98.2 F (36.8 C), temperature source Oral, height 5\' 7"  (1.702 m), weight 279 lb (126.6 kg), last menstrual period 08/01/2012, SpO2 99 %. Body mass index is 43.7 kg/m. Physical Exam Vitals signs reviewed.  Constitutional:      Appearance: Normal appearance. She is obese.  Cardiovascular:     Rate and Rhythm: Normal rate.     Pulses: Normal pulses.  Pulmonary:     Effort: Pulmonary effort is normal.     Breath sounds: Normal breath sounds.  Musculoskeletal: Normal range of motion.  Skin:    General: Skin is warm and dry.  Neurological:     Mental Status: She is alert and oriented to person, place, and time.  Psychiatric:        Behavior: Behavior normal.   RECENT LABS AND TESTS: BMET    Component Value Date/Time   NA 136 12/07/2018 1153   K 4.2 12/07/2018 1153   CL 101 12/07/2018 1153   CO2 20 12/07/2018 1153   GLUCOSE 89 12/07/2018 1153   GLUCOSE 96 12/02/2018 1500   BUN 15 12/07/2018 1153   CREATININE 0.92 12/07/2018 1153   CALCIUM 8.8 12/07/2018 1153   GFRNONAA 70 12/07/2018 1153   GFRAA 80 12/07/2018 1153   Lab Results  Component Value Date   HGBA1C 5.5 02/07/2019   HGBA1C 5.9 (A) 09/10/2018   HGBA1C 6.3 (H) 05/30/2018   HGBA1C 6.0 (A) 03/09/2018   Lab Results  Component Value Date   INSULIN 37.3 (H) 02/07/2019   INSULIN 41.4 (H) 05/30/2018   CBC    Component Value Date/Time   WBC 5.8 12/07/2018 1153   WBC 5.1 12/02/2018 1500   RBC 4.68 12/07/2018 1153   RBC 4.70 12/02/2018 1500   HGB 12.7 12/07/2018 1153   HCT 37.5 12/07/2018 1153   PLT 138 (L) 12/07/2018 1153   MCV 80 12/07/2018 1153   MCH 27.1 12/07/2018 1153   MCH 27.2 12/02/2018 1500   MCHC 33.9 12/07/2018 1153   MCHC  31.5 12/02/2018 1500   RDW 13.5 12/07/2018 1153   LYMPHSABS 1.7 12/02/2018 1500   LYMPHSABS 2.4 05/30/2018 1116   MONOABS 0.3 12/02/2018 1500   EOSABS 0.0 12/02/2018 1500   EOSABS 0.1 05/30/2018 1116   BASOSABS 0.0 12/02/2018 1500   BASOSABS 0.0 05/30/2018 1116   Iron/TIBC/Ferritin/ %Sat No results found for: IRON, TIBC, FERRITIN, IRONPCTSAT Lipid Panel     Component Value Date/Time   CHOL 134 05/30/2018 1116   TRIG 85 05/30/2018 1116   HDL 75 05/30/2018 1116   CHOLHDL 1.8 05/30/2018 1116   LDLCALC 42 05/30/2018 1116   Hepatic Function Panel     Component Value Date/Time   PROT 7.5 12/07/2018 1153   ALBUMIN 3.9 12/07/2018 1153   AST 50 (H) 12/07/2018 1153   ALT 56 (H) 12/07/2018 1153   ALKPHOS 65 12/07/2018 1153   BILITOT 0.6 12/07/2018 1153      Component Value Date/Time   TSH 1.410 12/07/2018 1153   TSH 1.280 05/30/2018 1116   TSH 1.220 04/26/2018 1647   Results for Waskey, Gloria L (MRN [id number]) as of 03/07/2019 07:25  Ref. Range 01/23/2019 16:00  Vitamin D, 25-Hydroxy Latest Ref Range: 30.0 - 100.0 ng/mL 27.9 (L)   OBESITY BEHAVIORAL INTERVENTION VISIT  Today's visit was #14   Starting weight: 296 lbs Starting date: 05/30/2018 Today's weight: 279 lbs  Today's date: 03/06/2019 Total lbs lost to date: 17  03/06/2019  Height 5\' 7"  (1.702 m)  Weight 279 lb (126.6 kg)  BMI (Calculated) 43.69  BLOOD PRESSURE - SYSTOLIC 99991111  BLOOD PRESSURE - DIASTOLIC 79   Body Fat % 123XX123 %  Total Body Water (lbs) 90.2 lbs   ASK: We discussed the diagnosis of obesity with Michelle Huerta today and Shaquala agreed to give [place] permission to discuss obesity behavioral modification therapy today.  ASSESS: Shevawn has the diagnosis of obesity and her BMI today is 43.7. Quandra is in the action stage of change.   ADVISE: Aydia was educated on the multiple health risks of obesity as well as the benefit of weight loss to improve her health. She was advised of the need for long term treatment  and the importance of lifestyle modifications to improve her current health and to decrease her risk of future health problems.  AGREE: Multiple dietary modification options and treatment options were discussed and  Laconda agreed to follow the recommendations documented in the above note.  ARRANGE: Marquasha was educated on the importance of frequent visits to treat obesity as outlined per CMS and USPSTF guidelines and agreed to schedule her next follow up appointment today.  Migdalia Dk, am acting as Location manager for [employer], DO   I have reviewed the above documentation for accuracy and completeness, and I agree with the above. -Jearld Lesch, DO

## 2019-03-08 ENCOUNTER — Ambulatory Visit (INDEPENDENT_AMBULATORY_CARE_PROVIDER_SITE_OTHER): Payer: 59 | Admitting: Family Medicine

## 2019-03-08 ENCOUNTER — Other Ambulatory Visit: Payer: Self-pay

## 2019-03-08 DIAGNOSIS — Z23 Encounter for immunization: Secondary | ICD-10-CM

## 2019-03-08 DIAGNOSIS — M545 Low back pain: Secondary | ICD-10-CM | POA: Diagnosis not present

## 2019-03-11 ENCOUNTER — Other Ambulatory Visit (INDEPENDENT_AMBULATORY_CARE_PROVIDER_SITE_OTHER): Payer: Self-pay | Admitting: Bariatrics

## 2019-03-11 DIAGNOSIS — E559 Vitamin D deficiency, unspecified: Secondary | ICD-10-CM

## 2019-03-11 DIAGNOSIS — E8881 Metabolic syndrome: Secondary | ICD-10-CM

## 2019-03-11 DIAGNOSIS — D259 Leiomyoma of uterus, unspecified: Secondary | ICD-10-CM | POA: Insufficient documentation

## 2019-03-14 ENCOUNTER — Encounter: Payer: Self-pay | Admitting: Gastroenterology

## 2019-03-14 ENCOUNTER — Other Ambulatory Visit: Payer: Self-pay

## 2019-03-14 ENCOUNTER — Ambulatory Visit: Payer: 59 | Admitting: Gastroenterology

## 2019-03-14 VITALS — BP 132/84 | HR 77 | Temp 98.0°F | Ht 66.0 in | Wt 279.1 lb

## 2019-03-14 DIAGNOSIS — M545 Low back pain, unspecified: Secondary | ICD-10-CM

## 2019-03-14 DIAGNOSIS — K6289 Other specified diseases of anus and rectum: Secondary | ICD-10-CM | POA: Diagnosis not present

## 2019-03-14 NOTE — Progress Notes (Signed)
History of Present Illness: This is a 58 year old referred by Forrest Moron, MD for the evaluation of rectal pain. For the past few months she notes pain for several minutes when sitting. The pain goes away on its own. No pain with BM or at other times. She has Pagets disease with ongoing back pain. Had MRI at [facility] recently and was told of a GB problem. Denies weight loss, abdominal pain, constipation, diarrhea, change in stool caliber, melena, hematochezia, nausea, vomiting, dysphagia, reflux symptoms, chest pain.   Colonoscopy 01/2016 - Seven 4 to 6 mm polyps in the sigmoid colon, removed with a cold snare. Resected and Retrieved. All hyperplastic polyps.  - Internal hemorrhoids. - The examination was otherwise normal on direct and retroflexion views.   Allergies  Allergen Reactions  . Penicillins Rash and Other (See Comments)    Has patient had a PCN reaction causing immediate rash, facial/tongue/throat swelling, SOB or lightheadedness with hypotension: Yes Has patient had a PCN reaction causing severe rash involving mucus membranes or skin necrosis: No Has patient had a PCN reaction that required hospitalization: No Has patient had a PCN reaction occurring within the last 10 years: No If all of the above answers are "NO", then may proceed with Cephalosporin use.   [name] Reclast [Zoledronic Acid] Nausea And Vomiting   Outpatient Medications Prior to Visit  Medication Sig Dispense Refill  . albuterol (VENTOLIN HFA) 108 (90 Base) MCG/ACT inhaler Inhale 2 puffs into the lungs every 6 (six) hours as needed for wheezing or shortness of breath. 1 Inhaler 0  . amLODipine (NORVASC) 5 MG tablet Take 1 tablet (5 mg total) by mouth daily. 90 tablet 3  . betamethasone dipropionate (DIPROLENE) 0.05 % cream Apply topically 2 (two) times daily. 30 g 0  . cetirizine (ZYRTEC) 10 MG tablet Take 10 mg by mouth daily.    [name] doxycycline (VIBRA-TABS) 100 MG tablet Take 1 tablet (100 mg total) by  mouth 2 (two) times daily. 20 tablet 0  . Estradiol 10 MCG INST Place 10 mcg vaginally 2 (two) times a week. 12 each 1  . fluticasone (FLONASE) 50 MCG/ACT nasal spray Place 2 sprays into both nostrils daily. 16 g 6  . ibuprofen (ADVIL,MOTRIN) 800 MG tablet Take 1 tablet (800 mg total) by mouth every 8 (eight) hours as needed. 90 tablet 0  . MELATONIN PO Take by mouth.    . metFORMIN (GLUCOPHAGE) 500 MG tablet Take 1 tablet (500 mg total) by mouth 2 (two) times daily with a meal. 60 tablet 0  . misoprostol (CYTOTEC) 200 MCG tablet Cytotec 200 mcg tablet  insert one tablet per vagina at bedtime night prior to procedure    . telmisartan (MICARDIS) 80 MG tablet Take 1 tablet (80 mg total) by mouth daily. 90 tablet 3  . temazepam (RESTORIL) 30 MG capsule     . traZODone (DESYREL) 50 MG tablet Take 1-2 tablets (50-100 mg total) by mouth at bedtime. 180 tablet 3  . valACYclovir (VALTREX) 500 MG tablet Take 1 tablet (500 mg total) by mouth daily. 90 tablet 3  . Vitamin D, Ergocalciferol, (DRISDOL) 1.25 MG (50000 UT) CAPS capsule Take 1 capsule (50,000 Units total) by mouth every 7 (seven) days. 4 capsule 0   No facility-administered medications prior to visit.    Past Medical History:  Diagnosis Date  . Allergy   . Arthritis of both knees   . Bilateral chronic knee pain   . Chronic back pain   .  Constipation   . DDD (degenerative disc disease), cervical   . Depression    no meds  . Genital herpes    HSV type 2 at rectum, culture positive 3/15  . Glaucoma   . Grade I diastolic dysfunction A999333   noted on ECHO   . Heart murmur   . History of colon polyps   . HLD (hyperlipidemia)   . Hypertension   . Hypothyroidism   . Insomnia   . Internal hemorrhoids   . Lactose intolerance   . Lupus ([place])   . LVH (left ventricular hypertrophy) 06/25/2015   Mild, noted on ECHO   . Mild sleep apnea    1/17 CPAP Dr Claiborne Billings does not use cpap   . Morbid obesity ([place]) 06/11/2015  . Osteoporosis   .  Paget's disease   . Paget's disease of bone    Dr Trudie Reed, Reclast insufion 02/14/10 - bad reaction to infusion, now tolerates fosamax weekly  . Plantar fasciitis, bilateral   . PMB (postmenopausal bleeding)   . Pre-diabetes   . Sleep apnea   . Thrombocytopenia ([place])   . TR (tricuspid regurgitation) 06/25/2015   Trace, noted on ECHO   . Tubular adenoma polyp of rectum    8/12, Dr Fuller Plan, 5 yr follow up   . Uterine fibroid 10/19/2017   noted on CT pelvis  . Vitamin D deficiency    Past Surgical History:  Procedure Laterality Date  . AUGMENTATION MAMMAPLASTY Bilateral   . BREAST ENHANCEMENT SURGERY    . BREAST REDUCTION SURGERY    . COLONOSCOPY  01/19/2016  . DILATION AND CURETTAGE OF UTERUS N/A 06/24/2015   Procedure: DILATATION AND CURETTAGE;  Surgeon: Emily Filbert, MD;  Location: [facility];  Service: Gynecology;  Laterality: N/A;  . KNEE ARTHROSCOPY WITH SUBCHONDROPLASTY Right 05/03/2018   Procedure: RIGHT KNEE ARTHROSCOPY WITH DEBRIDEMENT, PARTIAL MEDIAL MENISCECTOMY SUBCHONDROPLASTY MEDIAL TIBIAL PLATEAU AND MEDIAL FEMORAL CONDYLE;  Surgeon: Mcarthur Rossetti, MD;  Location: [facility];  Service: Orthopedics;  Laterality: Right;  . REDUCTION MAMMAPLASTY Bilateral   . TOE SURGERY     removal of bone in each foot  . TUBAL LIGATION     Social History   Socioeconomic History  . Marital status: Single    Spouse name: Not on file  . Number of children: Not on file  . Years of education: Not on file  . Highest education level: Not on file  Occupational History  . Occupation: Actuary- [employer]  Social Needs  . Financial resource strain: Not on file  . Food insecurity    Worry: Not on file    Inability: Not on file  . Transportation needs    Medical: Not on file    Non-medical: Not on file  Tobacco Use  . Smoking status: Former Smoker    Years: 7.00    Types: Cigars    Quit date: 08/11/2008    Years since quitting: 10.5  . Smokeless tobacco: Never Used  . Tobacco comment:  smoked Black&Milds, 1 pack per day (5 in a pack)  Substance and Sexual Activity  . Alcohol use: Yes    Alcohol/week: 5.0 standard drinks    Types: 5 Cans of beer per week    Comment: occ   . Drug use: Never  . Sexual activity: Not Currently    Partners: Male    Birth control/protection: Surgical  Lifestyle  . Physical activity    Days per week: Not on file    Minutes per  session: Not on file  . Stress: Not on file  Relationships  . Social Herbalist on phone: Not on file    Gets together: Not on file    Attends religious service: Not on file    Active member of club or organization: Not on file    Attends meetings of clubs or organizations: Not on file    Relationship status: Not on file  Other Topics Concern  . Not on file  Social History Narrative   Epworth sleepiness score as of 06/11/15 a 4   Exercise: yes, active at work, no formal exercise   Single, never married, in relationship off and on   Children: [place], [place] (both in [place]), 2 grandkids.  Raised 2 nieces [name], [address] (they have 5 kids)   Occupation: [employer]   Religion: faith important, not active in a church   Seatbelt use: yes   Family History  Problem Relation Age of Onset  . Hypertension Mother   . Heart disease Mother   . Hypertension Father   . Hypertension Sister   . Cancer Maternal Grandmother   . Stomach cancer Paternal Grandmother   . Hypertension Son   . Hypertension Son   . Colon cancer Neg Hx      Review of Systems: Pertinent positive and negative review of systems were noted in the above HPI section. All other review of systems were otherwise negative.    Physical Exam: General: Well developed, well nourished, no acute distress Head: Normocephalic and atraumatic Eyes:  sclerae anicteric, EOMI Ears: Normal auditory acuity Mouth: No deformity or lesions Neck: Supple, no masses or thyromegaly Lungs: Clear throughout to auscultation Heart:  Regular rate and rhythm; no murmurs, rubs or bruits Abdomen: Soft, non tender and non distended. No masses, hepatosplenomegaly or hernias noted. Normal Bowel sounds Rectal: no lesions, no tenderness, heme negative stool  Musculoskeletal: Symmetrical with no gross deformities  Skin: No lesions on visible extremities Pulses:  Normal pulses noted Extremities: No clubbing, cyanosis, edema or deformities noted Neurological: Alert oriented x 4, grossly nonfocal Cervical Nodes:  No significant cervical adenopathy Inguinal Nodes: No significant inguinal adenopathy Psychological:  Alert and cooperative. Normal mood and affect   Assessment and Recommendations:  1. Rectal pain, likely musculoskeletal. DRE unremarkable.  Trial of Preparation H cream twice daily for 1 week and then twice daily as needed.  Rectal care instructions for 1 week and then as needed.  If symptoms persist with sitting recommend further evaluation with her orthopedic surgeon and her PCP. Request MRI report from [facility].  2.  Personal history of an adenomatous colon polyp in 2012. No precancerous polyps on colonoscopy in 2017. Plan for a surveillance colonoscopy at  a 7 year interval in July 2024.  3. Colon AVM.    cc: Forrest Moron, MD [place],  [place] [id number]

## 2019-03-14 NOTE — Patient Instructions (Addendum)
You can use preparation H cream twice daily x 1 week.   RECTAL CARE INSTRUCTIONS:  1. Sitz Baths twice a day for 10 minutes each. 2. Thoroughly clean and dry the rectum. 3. Put Tucks pad against the rectum at night. 4. Clean the rectum with Balenol lotion after each bowel movement.  Normal BMI (Body Mass Index- based on height and weight) is between 19 and 25. Your BMI today is Body mass index is 45.05 kg/m. [name] Please consider follow up  regarding your BMI with your Primary Care Provider.   Thank you for choosing me and [facility].  Pricilla Riffle. Dagoberto Ligas., MD., Marval Regal

## 2019-03-20 ENCOUNTER — Other Ambulatory Visit: Payer: Self-pay

## 2019-03-20 ENCOUNTER — Encounter: Payer: Self-pay | Admitting: Orthopaedic Surgery

## 2019-03-20 ENCOUNTER — Ambulatory Visit (INDEPENDENT_AMBULATORY_CARE_PROVIDER_SITE_OTHER): Payer: 59 | Admitting: Orthopaedic Surgery

## 2019-03-20 VITALS — Wt 279.0 lb

## 2019-03-20 DIAGNOSIS — M1711 Unilateral primary osteoarthritis, right knee: Secondary | ICD-10-CM | POA: Diagnosis not present

## 2019-03-20 DIAGNOSIS — E663 Overweight: Secondary | ICD-10-CM | POA: Diagnosis not present

## 2019-03-20 NOTE — Progress Notes (Addendum)
HPI: Michelle Huerta returns today for follow-up of her right knee.  She has primary osteoarthritis.  She is working on weight loss so she can undergo right total knee arthroplasty.  She has tried conservative treatments which has included cortisone injections and supplemental injections without any relief.  She is also tried a brace which continues to slide down her leg.  She is currently down to 279 pounds from 292 pounds.  Her BMI is 43.7.  She is out of work due to the fact that they are unable to accommodate her work restrictions.   Review of systems: No fevers, chills or shortness of breath.  Physical exam: General well-developed well-nourished female no acute distress.  Ambulates without any assistive device with an antalgic gait.  She has good range of motion of the right knee.   Impression: Right knee primary osteoarthritis  Plan: Discussed with patient that she needs to continue to work on weight loss.  She is doing 2 different weight loss programs talked to her at length about the fact that it may be easier if she just stick with.  She states she will do the  weight loss program with Dr. Owens Shark who she is working with.  She needs to get her Weight down to 250 pounds or below to meet the criteria to undergo right total knee arthroplasty.  Questions were encouraged and answered at length.  We will keep her out of work until follow-up in 3 months.

## 2019-03-26 DIAGNOSIS — M5136 Other intervertebral disc degeneration, lumbar region: Secondary | ICD-10-CM | POA: Insufficient documentation

## 2019-03-27 ENCOUNTER — Other Ambulatory Visit: Payer: Self-pay

## 2019-03-27 ENCOUNTER — Ambulatory Visit (INDEPENDENT_AMBULATORY_CARE_PROVIDER_SITE_OTHER): Payer: 59 | Admitting: Bariatrics

## 2019-03-27 ENCOUNTER — Encounter (INDEPENDENT_AMBULATORY_CARE_PROVIDER_SITE_OTHER): Payer: Self-pay | Admitting: Bariatrics

## 2019-03-27 VITALS — BP 127/77 | Temp 98.0°F | Ht 67.0 in | Wt 276.0 lb

## 2019-03-27 DIAGNOSIS — Z6841 Body Mass Index (BMI) 40.0 and over, adult: Secondary | ICD-10-CM

## 2019-03-27 DIAGNOSIS — E559 Vitamin D deficiency, unspecified: Secondary | ICD-10-CM

## 2019-03-27 DIAGNOSIS — Z9189 Other specified personal risk factors, not elsewhere classified: Secondary | ICD-10-CM | POA: Diagnosis not present

## 2019-03-27 DIAGNOSIS — E8881 Metabolic syndrome: Secondary | ICD-10-CM

## 2019-04-01 ENCOUNTER — Encounter (INDEPENDENT_AMBULATORY_CARE_PROVIDER_SITE_OTHER): Payer: Self-pay | Admitting: Bariatrics

## 2019-04-01 MED ORDER — VITAMIN D (ERGOCALCIFEROL) 1.25 MG (50000 UNIT) PO CAPS: 50000.0000 [IU] | ORAL_CAPSULE | ORAL | 0 refills | Status: DC

## 2019-04-01 MED ORDER — METFORMIN HCL 500 MG PO TABS: 500.0000 mg | ORAL_TABLET | Freq: Two times a day (BID) | ORAL | 0 refills | Status: DC

## 2019-04-01 MED FILL — metFORMIN HCL 500 MG TABS: 500 | 30 days supply | Qty: 60 | Fill #0

## 2019-04-01 MED FILL — VIT D2 1.25 MG (50,000 UNIT: 1.25 MG | 28 days supply | Qty: 4 | Fill #0

## 2019-04-01 NOTE — Progress Notes (Signed)
Office: [phone]  /  Fax: [phone]   HPI:   Chief Complaint: OBESITY Michelle Huerta is here to discuss her progress with her obesity treatment plan. She is on the Category 3 plan and is following her eating plan approximately 30% of the time. She states she is exercising 0 minutes 0 times per week. Michelle Huerta is down 3 lbs. She has increased her water intake and is trying to eat more meals (more adherent to her meals). Her weight is 276 lb (125.2 kg) today and has had a weight loss of 3 pounds over a period of 3 weeks since her last visit. She has lost 20 lbs since starting treatment with [place].  Vitamin D deficiency Michelle Huerta has a diagnosis of Vitamin D deficiency. Last Vitamin D 27.9 on 01/23/2019. She is currently taking prescription Vit D and denies nausea, vomiting or muscle weakness.  At risk for osteopenia and osteoporosis Michelle Huerta is at higher risk of osteopenia and osteoporosis due to Vitamin D deficiency.   Insulin Resistance Michelle Huerta has a diagnosis of insulin resistance based on her elevated fasting insulin level >5. Last A1c 5.5 on 02/07/2019 with an insulin of 37.3. Although Michelle Huerta's blood glucose readings are still under good control, insulin resistance puts her at greater risk of metabolic syndrome and diabetes. She is taking metformin currently and continues to work on diet and exercise to decrease risk of diabetes. No polyphagia.  ASSESSMENT AND PLAN:  Vitamin D deficiency - Plan: Vitamin D, Ergocalciferol, (DRISDOL) 1.25 MG (50000 UT) CAPS capsule  Insulin resistance - Plan: metFORMIN (GLUCOPHAGE) 500 MG tablet  At risk for osteoporosis  Class 3 severe obesity with serious comorbidity and body mass index (BMI) of 40.0 to 44.9 in adult, unspecified obesity type ([place])  PLAN:  Vitamin D Deficiency Michelle Huerta was informed that low Vitamin D levels contributes to fatigue and are associated with obesity, breast, and colon cancer. She agrees to continue to take prescription Vit D @ 50,000 IU every  week #4 with 0 refills and will follow-up for routine testing of Vitamin D, at least 2-3 times per year. She was informed of the risk of over-replacement of Vitamin D and agrees to not increase her dose unless she discusses this with [place] first. Michelle Huerta agrees to follow-up with our clinic in 2 weeks.  At risk for osteopenia and osteoporosis Michelle Huerta was given extended  (15 minutes) osteoporosis prevention counseling today. Michelle Huerta is at risk for osteopenia and osteoporosis due to her Vitamin D deficiency. She was encouraged to take her Vitamin D and follow her higher calcium diet and increase strengthening exercise to help strengthen her bones and decrease her risk of osteopenia and osteoporosis.  Insulin Resistance Michelle Huerta will continue to work on weight loss, exercise, and decreasing simple carbohydrates in her diet to help decrease the risk of diabetes. We dicussed metformin including benefits and risks. She was informed that eating too many simple carbohydrates or too many calories at one sitting increases the likelihood of GI side effects. Michelle Huerta was given a prescription for metformin 500 mg 1 BID #60 with 0 refills and agrees to follow-up with our clinic in 2 weeks.  Obesity Michelle Huerta is currently in the action stage of change. As such, her goal is to continue with weight loss efforts. She has agreed to follow the Category 3 plan. Michelle Huerta will work on [employer] and intentional eating. Michelle Huerta has been instructed to work up to a goal of 150 minutes of combined cardio and strengthening exercise per week for  weight loss and overall health benefits. We discussed the following Behavioral Modification Strategies today: increasing lean protein intake, decreasing simple carbohydrates, increasing vegetables, increase H20 intake, decrease eating out, no skipping meals, work on meal planning and easy cooking plans, keeping healthy foods in the home, and planning for success.  Michelle Huerta has agreed to follow-up with our clinic in 2  weeks. She was informed of the importance of frequent follow-up visits to maximize her success with intensive lifestyle modifications for her multiple health conditions.  ALLERGIES: Allergies  Allergen Reactions  . Penicillins Rash and Other (See Comments)    Has patient had a PCN reaction causing immediate rash, facial/tongue/throat swelling, SOB or lightheadedness with hypotension: Yes Has patient had a PCN reaction causing severe rash involving mucus membranes or skin necrosis: No Has patient had a PCN reaction that required hospitalization: No Has patient had a PCN reaction occurring within the last 10 years: No If all of the above answers are "NO", then may proceed with Cephalosporin use.   [name] Reclast [Zoledronic Acid] Nausea And Vomiting    MEDICATIONS: Current Outpatient Medications on File Prior to Visit  Medication Sig Dispense Refill  . albuterol (VENTOLIN HFA) 108 (90 Base) MCG/ACT inhaler Inhale 2 puffs into the lungs every 6 (six) hours as needed for wheezing or shortness of breath. 1 Inhaler 0  . amLODipine (NORVASC) 5 MG tablet Take 1 tablet (5 mg total) by mouth daily. 90 tablet 3  . betamethasone dipropionate (DIPROLENE) 0.05 % cream Apply topically 2 (two) times daily. 30 g 0  . cetirizine (ZYRTEC) 10 MG tablet Take 10 mg by mouth daily.    [name] doxycycline (VIBRA-TABS) 100 MG tablet Take 1 tablet (100 mg total) by mouth 2 (two) times daily. 20 tablet 0  . Estradiol 10 MCG INST Place 10 mcg vaginally 2 (two) times a week. 12 each 1  . fluticasone (FLONASE) 50 MCG/ACT nasal spray Place 2 sprays into both nostrils daily. 16 g 6  . ibuprofen (ADVIL,MOTRIN) 800 MG tablet Take 1 tablet (800 mg total) by mouth every 8 (eight) hours as needed. 90 tablet 0  . MELATONIN PO Take by mouth.    . metFORMIN (GLUCOPHAGE) 500 MG tablet Take 1 tablet (500 mg total) by mouth 2 (two) times daily with a meal. 60 tablet 0  . misoprostol (CYTOTEC) 200 MCG tablet Cytotec 200 mcg tablet  insert  one tablet per vagina at bedtime night prior to procedure    . telmisartan (MICARDIS) 80 MG tablet Take 1 tablet (80 mg total) by mouth daily. 90 tablet 3  . temazepam (RESTORIL) 30 MG capsule     . traZODone (DESYREL) 50 MG tablet Take 1-2 tablets (50-100 mg total) by mouth at bedtime. 180 tablet 3  . valACYclovir (VALTREX) 500 MG tablet Take 1 tablet (500 mg total) by mouth daily. 90 tablet 3  . Vitamin D, Ergocalciferol, (DRISDOL) 1.25 MG (50000 UT) CAPS capsule Take 1 capsule (50,000 Units total) by mouth every 7 (seven) days. 4 capsule 0   No current facility-administered medications on file prior to visit.     PAST MEDICAL HISTORY: Past Medical History:  Diagnosis Date  . Allergy   . Arthritis of both knees   . Bilateral chronic knee pain   . Chronic back pain   . Constipation   . DDD (degenerative disc disease), cervical   . Depression    no meds  . Genital herpes    HSV type 2 at rectum, culture positive  3/15  . Glaucoma   . Grade I diastolic dysfunction [id number]   noted on ECHO   . Heart murmur   . History of colon polyps   . HLD (hyperlipidemia)   . Hypertension   . Hypothyroidism   . Insomnia   . Internal hemorrhoids   . Lactose intolerance   . Lupus ([place])   . LVH (left ventricular hypertrophy) 06/25/2015   Mild, noted on ECHO   . Mild sleep apnea    1/17 CPAP Dr Claiborne Billings does not use cpap   . Morbid obesity ([place]) 06/11/2015  . Osteoporosis   . Paget's disease   . Paget's disease of bone    Dr Trudie Reed, Reclast insufion 02/14/10 - bad reaction to infusion, now tolerates fosamax weekly  . Plantar fasciitis, bilateral   . PMB (postmenopausal bleeding)   . Pre-diabetes   . Sleep apnea   . Thrombocytopenia ([place])   . TR (tricuspid regurgitation) 06/25/2015   Trace, noted on ECHO   . Tubular adenoma polyp of rectum    8/12, Dr Fuller Plan, 5 yr follow up   . Uterine fibroid 10/19/2017   noted on CT pelvis  . Vitamin D deficiency     PAST SURGICAL HISTORY: Past  Surgical History:  Procedure Laterality Date  . AUGMENTATION MAMMAPLASTY Bilateral   . BREAST ENHANCEMENT SURGERY    . BREAST REDUCTION SURGERY    . COLONOSCOPY  01/19/2016  . DILATION AND CURETTAGE OF UTERUS N/A 06/24/2015   Procedure: DILATATION AND CURETTAGE;  Surgeon: Emily Filbert, MD;  Location: [facility];  Service: Gynecology;  Laterality: N/A;  . KNEE ARTHROSCOPY WITH SUBCHONDROPLASTY Right 05/03/2018   Procedure: RIGHT KNEE ARTHROSCOPY WITH DEBRIDEMENT, PARTIAL MEDIAL MENISCECTOMY SUBCHONDROPLASTY MEDIAL TIBIAL PLATEAU AND MEDIAL FEMORAL CONDYLE;  Surgeon: Mcarthur Rossetti, MD;  Location: [facility];  Service: Orthopedics;  Laterality: Right;  . REDUCTION MAMMAPLASTY Bilateral   . TOE SURGERY     removal of bone in each foot  . TUBAL LIGATION      SOCIAL HISTORY: Social History   Tobacco Use  . Smoking status: Former Smoker    Years: 7.00    Types: Cigars    Quit date: 08/11/2008    Years since quitting: 10.6  . Smokeless tobacco: Never Used  . Tobacco comment: smoked [employer], 1 pack per day (5 in a pack)  Substance Use Topics  . Alcohol use: Yes    Alcohol/week: 5.0 standard drinks    Types: 5 Cans of beer per week    Comment: occ   . Drug use: Never    FAMILY HISTORY: Family History  Problem Relation Age of Onset  . Hypertension Mother   . Heart disease Mother   . Hypertension Father   . Hypertension Sister   . Cancer Maternal Grandmother   . Stomach cancer Paternal Grandmother   . Hypertension Son   . Hypertension Son   . Colon cancer Neg Hx    ROS: Review of Systems  Gastrointestinal: Negative for nausea and vomiting.  Musculoskeletal:       Negative for muscle weakness.  Endo/Heme/Allergies:       Negative for polyphagia.   PHYSICAL EXAM: Blood pressure 127/77, temperature 98 F (36.7 C), temperature source Oral, height 5\' 7"  (1.702 m), weight 276 lb (125.2 kg), last menstrual period 08/01/2012. Body mass index is 43.23 kg/m. Physical Exam  Vitals signs reviewed.  Constitutional:      Appearance: Normal appearance. She is obese.  Cardiovascular:  Rate and Rhythm: Normal rate.     Pulses: Normal pulses.  Pulmonary:     Effort: Pulmonary effort is normal.     Breath sounds: Normal breath sounds.  Musculoskeletal: Normal range of motion.  Skin:    General: Skin is warm and dry.  Neurological:     Mental Status: She is alert and oriented to person, place, and time.  Psychiatric:        Behavior: Behavior normal.   RECENT LABS AND TESTS: BMET    Component Value Date/Time   NA 136 12/07/2018 1153   K 4.2 12/07/2018 1153   CL 101 12/07/2018 1153   CO2 20 12/07/2018 1153   GLUCOSE 89 12/07/2018 1153   GLUCOSE 96 12/02/2018 1500   BUN 15 12/07/2018 1153   CREATININE 0.92 12/07/2018 1153   CALCIUM 8.8 12/07/2018 1153   GFRNONAA 70 12/07/2018 1153   GFRAA 80 12/07/2018 1153   Lab Results  Component Value Date   HGBA1C 5.5 02/07/2019   HGBA1C 5.9 (A) 09/10/2018   HGBA1C 6.3 (H) 05/30/2018   HGBA1C 6.0 (A) 03/09/2018   Lab Results  Component Value Date   INSULIN 37.3 (H) 02/07/2019   INSULIN 41.4 (H) 05/30/2018   CBC    Component Value Date/Time   WBC 5.8 12/07/2018 1153   WBC 5.1 12/02/2018 1500   RBC 4.68 12/07/2018 1153   RBC 4.70 12/02/2018 1500   HGB 12.7 12/07/2018 1153   HCT 37.5 12/07/2018 1153   PLT 138 (L) 12/07/2018 1153   MCV 80 12/07/2018 1153   MCH 27.1 12/07/2018 1153   MCH 27.2 12/02/2018 1500   MCHC 33.9 12/07/2018 1153   MCHC 31.5 12/02/2018 1500   RDW 13.5 12/07/2018 1153   LYMPHSABS 1.7 12/02/2018 1500   LYMPHSABS 2.4 05/30/2018 1116   MONOABS 0.3 12/02/2018 1500   EOSABS 0.0 12/02/2018 1500   EOSABS 0.1 05/30/2018 1116   BASOSABS 0.0 12/02/2018 1500   BASOSABS 0.0 05/30/2018 1116   Iron/TIBC/Ferritin/ %Sat No results found for: IRON, TIBC, FERRITIN, IRONPCTSAT Lipid Panel     Component Value Date/Time   CHOL 134 05/30/2018 1116   TRIG 85 05/30/2018 1116   HDL 75  05/30/2018 1116   CHOLHDL 1.8 05/30/2018 1116   LDLCALC 42 05/30/2018 1116   Hepatic Function Panel     Component Value Date/Time   PROT 7.5 12/07/2018 1153   ALBUMIN 3.9 12/07/2018 1153   AST 50 (H) 12/07/2018 1153   ALT 56 (H) 12/07/2018 1153   ALKPHOS 65 12/07/2018 1153   BILITOT 0.6 12/07/2018 1153      Component Value Date/Time   TSH 1.410 12/07/2018 1153   TSH 1.280 05/30/2018 1116   TSH 1.220 04/26/2018 1647   Results for Fifita, Tailor L (MRN [id number]) as of 04/01/2019 10:51  Ref. Range 01/23/2019 16:00  Vitamin D, 25-Hydroxy Latest Ref Range: 30.0 - 100.0 ng/mL 27.9 (L)   OBESITY BEHAVIORAL INTERVENTION VISIT  Today's visit was #15   Starting weight: 296 lbs Starting date: 05/30/2018 Today's weight: 276 lbs Today's date: 03/27/2019 Total lbs lost to date: 20    03/27/2019  Height 5\' 7"  (1.702 m)  Weight 276 lb (125.2 kg)  BMI (Calculated) 43.22  BLOOD PRESSURE - SYSTOLIC [id number]  BLOOD PRESSURE - DIASTOLIC 77   Body Fat % 0000000 %  Total Body Water (lbs) 87.8 lbs   ASK: We discussed the diagnosis of obesity with [name] today and Aima agreed to give [place] permission to discuss  obesity behavioral modification therapy today.  ASSESS: Sadeen has the diagnosis of obesity and her BMI today is 43.3. Teesha is in the action stage of change.   ADVISE: Devonie was educated on the multiple health risks of obesity as well as the benefit of weight loss to improve her health. She was advised of the need for long term treatment and the importance of lifestyle modifications to improve her current health and to decrease her risk of future health problems.  AGREE: Multiple dietary modification options and treatment options were discussed and  Charlcie agreed to follow the recommendations documented in the above note.  ARRANGE:Kinslie was educated on the importance of frequent visits to treat obesity as outlined per CMS and USPSTF guidelines and agreed to schedule her next follow up  appointment today.  Migdalia Dk, am acting as Location manager for [employer], DO   I have reviewed the above documentation for accuracy and completeness, and I agree with the above. -Jearld Lesch, DO

## 2019-04-08 ENCOUNTER — Encounter: Payer: Self-pay | Admitting: Registered Nurse

## 2019-04-08 ENCOUNTER — Other Ambulatory Visit: Payer: Self-pay

## 2019-04-08 ENCOUNTER — Ambulatory Visit: Payer: 59 | Admitting: Registered Nurse

## 2019-04-08 VITALS — BP 154/91 | HR 74 | Temp 98.4°F | Resp 16 | Wt 276.0 lb

## 2019-04-08 DIAGNOSIS — R42 Dizziness and giddiness: Secondary | ICD-10-CM

## 2019-04-08 DIAGNOSIS — I10 Essential (primary) hypertension: Secondary | ICD-10-CM

## 2019-04-08 LAB — GLUCOSE, POCT (MANUAL RESULT ENTRY): POC Glucose: 95 mg/dl (ref 70–99)

## 2019-04-08 LAB — POCT URINALYSIS DIP (MANUAL ENTRY)
Bilirubin, UA: NEGATIVE
Blood, UA: NEGATIVE
Glucose, UA: NEGATIVE mg/dL
Ketones, POC UA: NEGATIVE mg/dL
Leukocytes, UA: NEGATIVE
Nitrite, UA: NEGATIVE
Protein Ur, POC: NEGATIVE mg/dL
Spec Grav, UA: 1.025 (ref 1.010–1.025)
Urobilinogen, UA: 0.2 E.U./dL
pH, UA: 6 (ref 5.0–8.0)

## 2019-04-08 MED ORDER — AMLODIPINE BESYLATE 10 MG PO TABS: 10.0000 mg | ORAL_TABLET | Freq: Every day | ORAL | 1 refills | Status: DC

## 2019-04-08 MED ORDER — MECLIZINE HCL 25 MG PO TABS: 25.0000 mg | ORAL_TABLET | Freq: Three times a day (TID) | ORAL | 0 refills | Status: DC | PRN

## 2019-04-08 MED ORDER — ONDANSETRON HCL 4 MG PO TABS: 4.0000 mg | ORAL_TABLET | Freq: Three times a day (TID) | ORAL | 0 refills | Status: DC | PRN

## 2019-04-08 MED FILL — ONDANSETRON HCL 4 MG TABLET: 4 | 7 days supply | Qty: 20 | Fill #0

## 2019-04-08 MED FILL — AMLODIPINE BESYLATE 10 MG T: 10 | 90 days supply | Qty: 90 | Fill #0

## 2019-04-08 MED FILL — MECLIZINE 25 MG TABLET: 25 | 10 days supply | Qty: 30 | Fill #0

## 2019-04-08 NOTE — Progress Notes (Signed)
Established Patient Office Visit  Subjective:  Patient ID: Michelle Huerta, female    DOB: Feb 07, 1961  Age: 58 y.o. MRN: [id number]  CC:  Chief Complaint  Patient presents with  . Dizziness    pt states x 2 days she has been feeling off balance     HPI Michelle Huerta presents for two days of feeling dizzy and off balance. She states she feels like she is moving - this happens when she rises suddenly, bends over, or when she is lying down. It happens throughout the day. It has happened 3-4 times in the past month. She has not changed her daily routine. She is concerned that it is related to BP, urine, or blood sugar.  Denies neurological symptoms, headache, chest pain, NVD, swelling in ankles, visual changes. No other noted symptoms.   Past Medical History:  Diagnosis Date  . Allergy   . Arthritis of both knees   . Bilateral chronic knee pain   . Chronic back pain   . Constipation   . DDD (degenerative disc disease), cervical   . Depression    no meds  . Genital herpes    HSV type 2 at rectum, culture positive 3/15  . Glaucoma   . Grade I diastolic dysfunction [id number]   noted on ECHO   . Heart murmur   . History of colon polyps   . HLD (hyperlipidemia)   . Hypertension   . Hypothyroidism   . Insomnia   . Internal hemorrhoids   . Lactose intolerance   . Lupus ([place])   . LVH (left ventricular hypertrophy) 06/25/2015   Mild, noted on ECHO   . Mild sleep apnea    1/17 CPAP Dr Claiborne Billings does not use cpap   . Morbid obesity ([place]) 06/11/2015  . Osteoporosis   . Paget's disease   . Paget's disease of bone    Dr Trudie Reed, Reclast insufion 02/14/10 - bad reaction to infusion, now tolerates fosamax weekly  . Plantar fasciitis, bilateral   . PMB (postmenopausal bleeding)   . Pre-diabetes   . Sleep apnea   . Thrombocytopenia ([place])   . TR (tricuspid regurgitation) 06/25/2015   Trace, noted on ECHO   . Tubular adenoma polyp of rectum    8/12, Dr Fuller Plan, 5 yr follow up   . Uterine  fibroid 10/19/2017   noted on CT pelvis  . Vitamin D deficiency     Past Surgical History:  Procedure Laterality Date  . AUGMENTATION MAMMAPLASTY Bilateral   . BREAST ENHANCEMENT SURGERY    . BREAST REDUCTION SURGERY    . COLONOSCOPY  01/19/2016  . DILATION AND CURETTAGE OF UTERUS N/A 06/24/2015   Procedure: DILATATION AND CURETTAGE;  Surgeon: Emily Filbert, MD;  Location: [facility];  Service: Gynecology;  Laterality: N/A;  . KNEE ARTHROSCOPY WITH SUBCHONDROPLASTY Right 05/03/2018   Procedure: RIGHT KNEE ARTHROSCOPY WITH DEBRIDEMENT, PARTIAL MEDIAL MENISCECTOMY SUBCHONDROPLASTY MEDIAL TIBIAL PLATEAU AND MEDIAL FEMORAL CONDYLE;  Surgeon: Mcarthur Rossetti, MD;  Location: [facility];  Service: Orthopedics;  Laterality: Right;  . REDUCTION MAMMAPLASTY Bilateral   . TOE SURGERY     removal of bone in each foot  . TUBAL LIGATION      Family History  Problem Relation Age of Onset  . Hypertension Mother   . Heart disease Mother   . Hypertension Father   . Hypertension Sister   . Cancer Maternal Grandmother   . Stomach cancer Paternal Grandmother   . Hypertension Son   .  Hypertension Son   . Colon cancer Neg Hx     Social History   Socioeconomic History  . Marital status: Single    Spouse name: Not on file  . Number of children: Not on file  . Years of education: Not on file  . Highest education level: Not on file  Occupational History  . Occupation: Actuary- [employer]  Social Needs  . Financial resource strain: Not on file  . Food insecurity    Worry: Not on file    Inability: Not on file  . Transportation needs    Medical: Not on file    Non-medical: Not on file  Tobacco Use  . Smoking status: Former Smoker    Years: 7.00    Types: Cigars    Quit date: 08/11/2008    Years since quitting: 10.6  . Smokeless tobacco: Never Used  . Tobacco comment: smoked [employer], 1 pack per day (5 in a pack)  Substance and Sexual Activity  . Alcohol use: Yes    Alcohol/week: 5.0  standard drinks    Types: 5 Cans of beer per week    Comment: occ   . Drug use: Never  . Sexual activity: Not Currently    Partners: Male    Birth control/protection: Surgical  Lifestyle  . Physical activity    Days per week: Not on file    Minutes per session: Not on file  . Stress: Not on file  Relationships  . Social Herbalist on phone: Not on file    Gets together: Not on file    Attends religious service: Not on file    Active member of club or organization: Not on file    Attends meetings of clubs or organizations: Not on file    Relationship status: Not on file  . Intimate partner violence    Fear of current or ex partner: Not on file    Emotionally abused: Not on file    Physically abused: Not on file    Forced sexual activity: Not on file  Other Topics Concern  . Not on file  Social History Narrative   Epworth sleepiness score as of 06/11/15 a 4   Exercise: yes, active at work, no formal exercise   Single, never married, in relationship off and on   Children: [place], [place] (both in [place]), 2 grandkids.  Raised 2 nieces [name], [address] (they have 5 kids)   Occupation: [employer]   Religion: faith important, not active in a church   Seatbelt use: yes    Outpatient Medications Prior to Visit  Medication Sig Dispense Refill  . albuterol (VENTOLIN HFA) 108 (90 Base) MCG/ACT inhaler Inhale 2 puffs into the lungs every 6 (six) hours as needed for wheezing or shortness of breath. 1 Inhaler 0  . alendronate (FOSAMAX) 70 MG tablet alendronate 70 mg tablet    . amLODipine (NORVASC) 5 MG tablet Take 1 tablet (5 mg total) by mouth daily. 90 tablet 3  . betamethasone dipropionate (DIPROLENE) 0.05 % cream Apply topically 2 (two) times daily. 30 g 0  . cetirizine (ZYRTEC) 10 MG tablet Take 10 mg by mouth daily.    . cyclobenzaprine (FLEXERIL) 10 MG tablet cyclobenzaprine 10 mg tablet    . diclofenac (VOLTAREN) 75 MG EC tablet diclofenac  sodium 75 mg tablet,delayed release    . Estradiol 10 MCG INST Place 10 mcg vaginally 2 (two) times a week. 12 each 1  . fluticasone (  FLONASE) 50 MCG/ACT nasal spray Place 2 sprays into both nostrils daily. 16 g 6  . ibuprofen (ADVIL,MOTRIN) 800 MG tablet Take 1 tablet (800 mg total) by mouth every 8 (eight) hours as needed. 90 tablet 0  . MELATONIN PO Take by mouth.    . metFORMIN (GLUCOPHAGE) 500 MG tablet Take 1 tablet (500 mg total) by mouth 2 (two) times daily with a meal. 60 tablet 0  . misoprostol (CYTOTEC) 200 MCG tablet Cytotec 200 mcg tablet  insert one tablet per vagina at bedtime night prior to procedure    . Naltrexone-buPROPion HCl ER (CONTRAVE) 8-90 MG TB12 Contrave 8 mg-90 mg tablet,extended release    . omeprazole (PRILOSEC) 40 MG capsule omeprazole 40 mg capsule,delayed release    . telmisartan (MICARDIS) 80 MG tablet Take 1 tablet (80 mg total) by mouth daily. 90 tablet 3  . temazepam (RESTORIL) 30 MG capsule     . traZODone (DESYREL) 50 MG tablet Take 1-2 tablets (50-100 mg total) by mouth at bedtime. 180 tablet 3  . valACYclovir (VALTREX) 500 MG tablet Take 1 tablet (500 mg total) by mouth daily. 90 tablet 3  . Vitamin D, Ergocalciferol, (DRISDOL) 1.25 MG (50000 UT) CAPS capsule Take 1 capsule (50,000 Units total) by mouth every 7 (seven) days. 4 capsule 0  . doxycycline (VIBRA-TABS) 100 MG tablet Take 1 tablet (100 mg total) by mouth 2 (two) times daily. 20 tablet 0  . HYDROcodone-homatropine (HYCODAN) 5-1.5 MG/5ML syrup hydrocodone-homatropine 5 mg-1.5 mg/5 mL oral syrup    . meclizine (ANTIVERT) 25 MG tablet meclizine 25 mg tablet     No facility-administered medications prior to visit.     Allergies  Allergen Reactions  . Penicillins Rash and Other (See Comments)    Has patient had a PCN reaction causing immediate rash, facial/tongue/throat swelling, SOB or lightheadedness with hypotension: Yes Has patient had a PCN reaction causing severe rash involving mucus  membranes or skin necrosis: No Has patient had a PCN reaction that required hospitalization: No Has patient had a PCN reaction occurring within the last 10 years: No If all of the above answers are "NO", then may proceed with Cephalosporin use.   [name] Reclast [Zoledronic Acid] Nausea And Vomiting    ROS Review of Systems  Constitutional: Negative.   HENT: Negative.   Eyes: Negative.   Respiratory: Negative.   Cardiovascular: Negative.   Gastrointestinal: Negative.   Endocrine: Negative.   Genitourinary: Negative.   Musculoskeletal: Negative.   Skin: Negative.   Allergic/Immunologic: Negative.   Neurological: Positive for dizziness and light-headedness. Negative for tremors, seizures, syncope, facial asymmetry, speech difficulty, weakness, numbness and headaches.  Hematological: Negative.   Psychiatric/Behavioral: Negative.   All other systems reviewed and are negative.     Objective:    Physical Exam  Constitutional: She is oriented to person, place, and time. She appears well-developed and well-nourished. No distress.  Cardiovascular: Normal rate, regular rhythm and normal heart sounds. Exam reveals no gallop and no friction rub.  No murmur heard. Pulmonary/Chest: Effort normal and breath sounds normal. No respiratory distress.  Neurological: She is alert and oriented to person, place, and time. She has normal strength and normal reflexes. She displays no atrophy, no tremor and normal reflexes. No cranial nerve deficit or sensory deficit. She exhibits normal muscle tone. She displays a negative Romberg sign. She displays no seizure activity. Coordination and gait normal. GCS eye subscore is 4. GCS verbal subscore is 5. GCS motor subscore is 6.  Neg  pronator drift  Skin: Skin is warm and dry. No rash noted. She is not diaphoretic. No erythema. No pallor.  Psychiatric: She has a normal mood and affect. Her behavior is normal. Judgment and thought content normal.  Nursing note and  vitals reviewed.   BP (!) 154/91   Pulse 74   Temp 98.4 F (36.9 C) (Oral)   Resp 16   Wt 276 lb (125.2 kg)   LMP 08/01/2012   SpO2 99%   BMI 43.23 kg/m  Wt Readings from Last 3 Encounters:  04/08/19 276 lb (125.2 kg)  03/27/19 276 lb (125.2 kg)  03/20/19 279 lb (126.6 kg)     There are no preventive care reminders to display for this patient.  There are no preventive care reminders to display for this patient.  Lab Results  Component Value Date   TSH 1.410 12/07/2018   Lab Results  Component Value Date   WBC 5.8 12/07/2018   HGB 12.7 12/07/2018   HCT 37.5 12/07/2018   MCV 80 12/07/2018   PLT 138 (L) 12/07/2018   Lab Results  Component Value Date   NA 136 12/07/2018   K 4.2 12/07/2018   CO2 20 12/07/2018   GLUCOSE 89 12/07/2018   BUN 15 12/07/2018   CREATININE 0.92 12/07/2018   BILITOT 0.6 12/07/2018   ALKPHOS 65 12/07/2018   AST 50 (H) 12/07/2018   ALT 56 (H) 12/07/2018   PROT 7.5 12/07/2018   ALBUMIN 3.9 12/07/2018   CALCIUM 8.8 12/07/2018   ANIONGAP 8 12/02/2018   Lab Results  Component Value Date   CHOL 134 05/30/2018   Lab Results  Component Value Date   HDL 75 05/30/2018   Lab Results  Component Value Date   LDLCALC 42 05/30/2018   Lab Results  Component Value Date   TRIG 85 05/30/2018   Lab Results  Component Value Date   CHOLHDL 1.8 05/30/2018   Lab Results  Component Value Date   HGBA1C 5.5 02/07/2019      Assessment & Plan:   Problem List Items Addressed This Visit      Cardiovascular and Mediastinum   Essential hypertension   Relevant Medications   amLODipine (NORVASC) 10 MG tablet    Other Visit Diagnoses    Dizzy spells    -  Primary   Relevant Medications   meclizine (ANTIVERT) 25 MG tablet   ondansetron (ZOFRAN) 4 MG tablet   Other Relevant Orders   POCT glucose (manual entry) (Completed)   POCT urinalysis dipstick (Completed)      Meds ordered this encounter  Medications  . meclizine (ANTIVERT) 25 MG  tablet    Sig: Take 1 tablet (25 mg total) by mouth 3 (three) times daily as needed for dizziness.    Dispense:  30 tablet    Refill:  0    Order Specific Question:   Supervising Provider    Answer:   Delia Chimes A [id number]  . ondansetron (ZOFRAN) 4 MG tablet    Sig: Take 1 tablet (4 mg total) by mouth every 8 (eight) hours as needed for nausea or vomiting.    Dispense:  20 tablet    Refill:  0    Order Specific Question:   Supervising Provider    Answer:   Delia Chimes A [id number]  . amLODipine (NORVASC) 10 MG tablet    Sig: Take 1 tablet (10 mg total) by mouth daily.    Dispense:  90 tablet    Refill:  1    Order Specific Question:   Supervising Provider    Answer:   Forrest Moron [id number]    Follow-up: No follow-ups on file.   PLAN  Feel this is likely BPPV, no red flags on exam. Glucose wnl, urine dip negative. Will trial meclazine and ondansetron.   Lavaged R ear, significant wax removed. TMs visualized, no AOM.   Return to clinic if symptoms worsen or fail to improve  Patient encouraged to call clinic with any questions, comments, or concerns.   Maximiano Coss, NP

## 2019-04-08 NOTE — Patient Instructions (Signed)
° ° ° °  If you have lab work done today you will be contacted with your lab results within the next 2 weeks.  If you have not heard from us then please contact us. The fastest way to get your results is to register for My Chart. ° ° °IF you received an x-ray today, you will receive an invoice from [facility]. Please contact [facility] at [phone] with questions or concerns regarding your invoice.  ° °IF you received labwork today, you will receive an invoice from [facility]. Please contact [facility] at [phone] with questions or concerns regarding your invoice.  ° °Our billing staff will not be able to assist you with questions regarding bills from these companies. ° °You will be contacted with the lab results as soon as they are available. The fastest way to get your results is to activate your My Chart account. Instructions are located on the last page of this paperwork. If you have not heard from us regarding the results in 2 weeks, please contact this office. °  ° ° ° °

## 2019-04-10 ENCOUNTER — Other Ambulatory Visit: Payer: Self-pay

## 2019-04-10 ENCOUNTER — Encounter (INDEPENDENT_AMBULATORY_CARE_PROVIDER_SITE_OTHER): Payer: Self-pay | Admitting: Bariatrics

## 2019-04-10 ENCOUNTER — Ambulatory Visit (INDEPENDENT_AMBULATORY_CARE_PROVIDER_SITE_OTHER): Payer: 59 | Admitting: Bariatrics

## 2019-04-10 VITALS — BP 136/84 | HR 75 | Temp 98.3°F | Ht 67.0 in | Wt 274.0 lb

## 2019-04-10 DIAGNOSIS — E8881 Metabolic syndrome: Secondary | ICD-10-CM

## 2019-04-10 DIAGNOSIS — I10 Essential (primary) hypertension: Secondary | ICD-10-CM

## 2019-04-10 DIAGNOSIS — T7840XA Allergy, unspecified, initial encounter: Secondary | ICD-10-CM | POA: Diagnosis not present

## 2019-04-10 DIAGNOSIS — Z6841 Body Mass Index (BMI) 40.0 and over, adult: Secondary | ICD-10-CM

## 2019-04-10 DIAGNOSIS — Z9189 Other specified personal risk factors, not elsewhere classified: Secondary | ICD-10-CM | POA: Diagnosis not present

## 2019-04-10 DIAGNOSIS — E66813 Obesity, class 3: Secondary | ICD-10-CM

## 2019-04-10 DIAGNOSIS — K5909 Other constipation: Secondary | ICD-10-CM | POA: Diagnosis not present

## 2019-04-10 DIAGNOSIS — E88819 Insulin resistance, unspecified: Secondary | ICD-10-CM

## 2019-04-10 NOTE — Progress Notes (Signed)
Office: [phone]  /  Fax: [phone]   HPI:   Chief Complaint: OBESITY Michelle Huerta is here to discuss her progress with her obesity treatment plan. She is on the Category 3 plan and is following her eating plan approximately 30% of the time. She states she is exercising 0 minutes 0 times per week. Michelle Huerta is down 2 lbs and doing well overall.  Her weight is 274 lb (124.3 kg) today and has had a weight loss of 2 pounds over a period of 2 weeks since her last visit. She has lost 22 lbs since starting treatment with [place].  Insulin Resistance Michelle Huerta has a diagnosis of insulin resistance based on her elevated fasting insulin level >5. Although Michelle Huerta's blood glucose readings are still under good control, insulin resistance puts her at greater risk of metabolic syndrome and diabetes. She is taking Glucophage currently and continues to work on diet and exercise to decrease risk of diabetes. No polyphagia.  At risk for diabetes Michelle Huerta is at higher than average risk for developing diabetes due to her obesity. She currently denies polyuria or polydipsia.  Hypertension Michelle Huerta is a 58 y.o. female with hypertension and is reasonably well controlled. She is taking Norvasc and Micardis. Michelle Huerta denies chest pain or shortness of breath on exertion. She is working weight loss to help control her blood pressure with the goal of decreasing her risk of heart attack and stroke.  Allergies Michelle Huerta is taking an antihistamine. She states she saw her PCP for vertigo.  Constipation Michelle Huerta notes constipation with bowel movements every 4-5 days. She denies retained stool.  ASSESSMENT AND PLAN:  Insulin resistance  Essential hypertension  Allergic state, initial encounter  Other constipation  At risk for diabetes mellitus  Class 3 severe obesity with serious comorbidity and body mass index (BMI) of 40.0 to 44.9 in adult, unspecified obesity type ([place])  PLAN:  Insulin Resistance Michelle Huerta will continue to work  on weight loss, exercise, and decreasing simple carbohydrates in her diet to help decrease the risk of diabetes. We dicussed metformin including benefits and risks. She was informed that eating too many simple carbohydrates or too many calories at one sitting increases the likelihood of GI side effects. Michelle Huerta will continue Glucophage and will follow-up with our clinic in 2 weeks.  Diabetes risk counseling Michelle Huerta was given extended (15 minutes) diabetes prevention counseling today. She is 58 y.o. female and has risk factors for diabetes including obesity. We discussed intensive lifestyle modifications today with an emphasis on weight loss as well as increasing exercise and decreasing simple carbohydrates in her diet.  Hypertension We discussed sodium restriction, working on healthy weight loss, and a regular exercise program as the means to achieve improved blood pressure control. Michelle Huerta agreed with this plan and agreed to follow up as directed. We will continue to monitor her blood pressure as well as her progress with the above lifestyle modifications. She will continue her medications as prescribed and will watch for signs of hypotension as she continues her lifestyle modifications.  Allergies Michelle Huerta will resume her Flonase (has a prescription).   Constipation Michelle Huerta takes Dulcolax OTC occasionally as needed. She was instructed to increase her water intake.  Obesity Michelle Huerta is currently in the action stage of change. As such, her goal is to continue with weight loss efforts. She has agreed to follow the Category 3 plan. Michelle Huerta will work on meal planning, intentional eating, increasing her water intake, and decreasing her sweets. Michelle Huerta has joined  the [facility] for weight loss and overall health benefits. We discussed the following Behavioral Modification Strategies today: increasing lean protein intake, decreasing simple carbohydrates, increasing vegetables, increase H20 intake, decrease eating out, no skipping  meals, work on meal planning and easy cooking plans, keeping healthy foods in the home, and planning for success.  Michelle Huerta has agreed to follow-up with our clinic in 2 weeks. She was informed of the importance of frequent follow-up visits to maximize her success with intensive lifestyle modifications for her multiple health conditions.  ALLERGIES: Allergies  Allergen Reactions  . Penicillins Rash and Other (See Comments)    Has patient had a PCN reaction causing immediate rash, facial/tongue/throat swelling, SOB or lightheadedness with hypotension: Yes Has patient had a PCN reaction causing severe rash involving mucus membranes or skin necrosis: No Has patient had a PCN reaction that required hospitalization: No Has patient had a PCN reaction occurring within the last 10 years: No If all of the above answers are "NO", then may proceed with Cephalosporin use.   Michelle Huerta Reclast [Zoledronic Acid] Nausea And Vomiting    MEDICATIONS: Current Outpatient Medications on File Prior to Visit  Medication Sig Dispense Refill  . albuterol (VENTOLIN HFA) 108 (90 Base) MCG/ACT inhaler Inhale 2 puffs into the lungs every 6 (six) hours as needed for wheezing or shortness of breath. 1 Inhaler 0  . alendronate (FOSAMAX) 70 MG tablet alendronate 70 mg tablet    . amLODipine (NORVASC) 10 MG tablet Take 1 tablet (10 mg total) by mouth daily. 90 tablet 1  . amLODipine (NORVASC) 5 MG tablet Take 1 tablet (5 mg total) by mouth daily. 90 tablet 3  . betamethasone dipropionate (DIPROLENE) 0.05 % cream Apply topically 2 (two) times daily. 30 g 0  . cetirizine (ZYRTEC) 10 MG tablet Take 10 mg by mouth daily.    . cyclobenzaprine (FLEXERIL) 10 MG tablet cyclobenzaprine 10 mg tablet    . diclofenac (VOLTAREN) 75 MG EC tablet diclofenac sodium 75 mg tablet,delayed release    . Estradiol 10 MCG INST Place 10 mcg vaginally 2 (two) times a week. 12 each 1  . fluticasone (FLONASE) 50 MCG/ACT nasal spray Place 2 sprays into both  nostrils daily. 16 g 6  . ibuprofen (ADVIL,MOTRIN) 800 MG tablet Take 1 tablet (800 mg total) by mouth every 8 (eight) hours as needed. 90 tablet 0  . meclizine (ANTIVERT) 25 MG tablet Take 1 tablet (25 mg total) by mouth 3 (three) times daily as needed for dizziness. 30 tablet 0  . MELATONIN PO Take by mouth.    . metFORMIN (GLUCOPHAGE) 500 MG tablet Take 1 tablet (500 mg total) by mouth 2 (two) times daily with a meal. 60 tablet 0  . misoprostol (CYTOTEC) 200 MCG tablet Cytotec 200 mcg tablet  insert one tablet per vagina at bedtime night prior to procedure    . omeprazole (PRILOSEC) 40 MG capsule omeprazole 40 mg capsule,delayed release    . ondansetron (ZOFRAN) 4 MG tablet Take 1 tablet (4 mg total) by mouth every 8 (eight) hours as needed for nausea or vomiting. 20 tablet 0  . telmisartan (MICARDIS) 80 MG tablet Take 1 tablet (80 mg total) by mouth daily. 90 tablet 3  . temazepam (RESTORIL) 30 MG capsule     . traZODone (DESYREL) 50 MG tablet Take 1-2 tablets (50-100 mg total) by mouth at bedtime. 180 tablet 3  . valACYclovir (VALTREX) 500 MG tablet Take 1 tablet (500 mg total) by mouth daily. 90 tablet  3  . Vitamin D, Ergocalciferol, (DRISDOL) 1.25 MG (50000 UT) CAPS capsule Take 1 capsule (50,000 Units total) by mouth every 7 (seven) days. 4 capsule 0   No current facility-administered medications on file prior to visit.     PAST MEDICAL HISTORY: Past Medical History:  Diagnosis Date  . Allergy   . Arthritis of both knees   . Bilateral chronic knee pain   . Chronic back pain   . Constipation   . DDD (degenerative disc disease), cervical   . Depression    no meds  . Genital herpes    HSV type 2 at rectum, culture positive 3/15  . Glaucoma   . Grade I diastolic dysfunction [id number]   noted on ECHO   . Heart murmur   . History of colon polyps   . HLD (hyperlipidemia)   . Hypertension   . Hypothyroidism   . Insomnia   . Internal hemorrhoids   . Lactose intolerance   .  Lupus ([place])   . LVH (left ventricular hypertrophy) 06/25/2015   Mild, noted on ECHO   . Mild sleep apnea    1/17 CPAP Dr Claiborne Billings does not use cpap   . Morbid obesity ([place]) 06/11/2015  . Osteoporosis   . Paget's disease   . Paget's disease of bone    Dr Trudie Reed, Reclast insufion 02/14/10 - bad reaction to infusion, now tolerates fosamax weekly  . Plantar fasciitis, bilateral   . PMB (postmenopausal bleeding)   . Pre-diabetes   . Sleep apnea   . Thrombocytopenia ([place])   . TR (tricuspid regurgitation) 06/25/2015   Trace, noted on ECHO   . Tubular adenoma polyp of rectum    8/12, Dr Fuller Plan, 5 yr follow up   . Uterine fibroid 10/19/2017   noted on CT pelvis  . Vitamin D deficiency     PAST SURGICAL HISTORY: Past Surgical History:  Procedure Laterality Date  . AUGMENTATION MAMMAPLASTY Bilateral   . BREAST ENHANCEMENT SURGERY    . BREAST REDUCTION SURGERY    . COLONOSCOPY  01/19/2016  . DILATION AND CURETTAGE OF UTERUS N/A 06/24/2015   Procedure: DILATATION AND CURETTAGE;  Surgeon: Emily Filbert, MD;  Location: [facility];  Service: Gynecology;  Laterality: N/A;  . KNEE ARTHROSCOPY WITH SUBCHONDROPLASTY Right 05/03/2018   Procedure: RIGHT KNEE ARTHROSCOPY WITH DEBRIDEMENT, PARTIAL MEDIAL MENISCECTOMY SUBCHONDROPLASTY MEDIAL TIBIAL PLATEAU AND MEDIAL FEMORAL CONDYLE;  Surgeon: Mcarthur Rossetti, MD;  Location: [facility];  Service: Orthopedics;  Laterality: Right;  . REDUCTION MAMMAPLASTY Bilateral   . TOE SURGERY     removal of bone in each foot  . TUBAL LIGATION      SOCIAL HISTORY: Social History   Tobacco Use  . Smoking status: Former Smoker    Years: 7.00    Types: Cigars    Quit date: 08/11/2008    Years since quitting: 10.6  . Smokeless tobacco: Never Used  . Tobacco comment: smoked [employer], 1 pack per day (5 in a pack)  Substance Use Topics  . Alcohol use: Yes    Alcohol/week: 5.0 standard drinks    Types: 5 Cans of beer per week    Comment: occ   . Drug use: Never     FAMILY HISTORY: Family History  Problem Relation Age of Onset  . Hypertension Mother   . Heart disease Mother   . Hypertension Father   . Hypertension Sister   . Cancer Maternal Grandmother   . Stomach cancer Paternal Grandmother   .  Hypertension Son   . Hypertension Son   . Colon cancer Neg Hx    ROS: Review of Systems  Respiratory: Negative for shortness of breath.   Cardiovascular: Negative for chest pain.  Endo/Heme/Allergies:       Negative for polyphagia.   PHYSICAL EXAM: Blood pressure 136/84, pulse 75, temperature 98.3 F (36.8 C), temperature source Oral, height 5\' 7"  (1.702 m), weight 274 lb (124.3 kg), last menstrual period 08/01/2012, SpO2 100 %. Body mass index is 42.91 kg/m. Physical Exam Vitals signs reviewed.  Constitutional:      Appearance: Normal appearance. She is obese.  Cardiovascular:     Rate and Rhythm: Normal rate.     Pulses: Normal pulses.  Pulmonary:     Effort: Pulmonary effort is normal.     Breath sounds: Normal breath sounds.  Musculoskeletal: Normal range of motion.  Skin:    General: Skin is warm and dry.  Neurological:     Mental Status: She is alert and oriented to person, place, and time.  Psychiatric:        Behavior: Behavior normal.   RECENT LABS AND TESTS: BMET    Component Value Date/Time   NA 136 12/07/2018 1153   K 4.2 12/07/2018 1153   CL 101 12/07/2018 1153   CO2 20 12/07/2018 1153   GLUCOSE 89 12/07/2018 1153   GLUCOSE 96 12/02/2018 1500   BUN 15 12/07/2018 1153   CREATININE 0.92 12/07/2018 1153   CALCIUM 8.8 12/07/2018 1153   GFRNONAA 70 12/07/2018 1153   GFRAA 80 12/07/2018 1153   Lab Results  Component Value Date   HGBA1C 5.5 02/07/2019   HGBA1C 5.9 (A) 09/10/2018   HGBA1C 6.3 (H) 05/30/2018   HGBA1C 6.0 (A) 03/09/2018   Lab Results  Component Value Date   INSULIN 37.3 (H) 02/07/2019   INSULIN 41.4 (H) 05/30/2018   CBC    Component Value Date/Time   WBC 5.8 12/07/2018 1153   WBC 5.1  12/02/2018 1500   RBC 4.68 12/07/2018 1153   RBC 4.70 12/02/2018 1500   HGB 12.7 12/07/2018 1153   HCT 37.5 12/07/2018 1153   PLT 138 (L) 12/07/2018 1153   MCV 80 12/07/2018 1153   MCH 27.1 12/07/2018 1153   MCH 27.2 12/02/2018 1500   MCHC 33.9 12/07/2018 1153   MCHC 31.5 12/02/2018 1500   RDW 13.5 12/07/2018 1153   LYMPHSABS 1.7 12/02/2018 1500   LYMPHSABS 2.4 05/30/2018 1116   MONOABS 0.3 12/02/2018 1500   EOSABS 0.0 12/02/2018 1500   EOSABS 0.1 05/30/2018 1116   BASOSABS 0.0 12/02/2018 1500   BASOSABS 0.0 05/30/2018 1116   Iron/TIBC/Ferritin/ %Sat No results found for: IRON, TIBC, FERRITIN, IRONPCTSAT Lipid Panel     Component Value Date/Time   CHOL 134 05/30/2018 1116   TRIG 85 05/30/2018 1116   HDL 75 05/30/2018 1116   CHOLHDL 1.8 05/30/2018 1116   LDLCALC 42 05/30/2018 1116   Hepatic Function Panel     Component Value Date/Time   PROT 7.5 12/07/2018 1153   ALBUMIN 3.9 12/07/2018 1153   AST 50 (H) 12/07/2018 1153   ALT 56 (H) 12/07/2018 1153   ALKPHOS 65 12/07/2018 1153   BILITOT 0.6 12/07/2018 1153      Component Value Date/Time   TSH 1.410 12/07/2018 1153   TSH 1.280 05/30/2018 1116   TSH 1.220 04/26/2018 1647   Results for [place], Yen L (MRN [id number]) as of 04/10/2019 13:47  Ref. Range 01/23/2019 16:00  Vitamin D, 25-Hydroxy Latest  Ref Range: 30.0 - 100.0 ng/mL 27.9 (L)   OBESITY BEHAVIORAL INTERVENTION VISIT  Today's visit was #16  Starting weight: 296 lbs Starting date: 05/30/2018 Today's weight: 274 lbs  Today's date: 04/10/2019 Total lbs lost to date: 22    04/10/2019  Height 5\' 7"  (1.702 m)  Weight 274 lb (124.3 kg)  BMI (Calculated) 42.9  BLOOD PRESSURE - SYSTOLIC [id number]  BLOOD PRESSURE - DIASTOLIC 84   Body Fat % [id number] %  Total Body Water (lbs) 86.8 lbs   ASK: We discussed the diagnosis of obesity with Michelle Huerta today and Taelar agreed to give [place] permission to discuss obesity behavioral modification therapy today.  ASSESS: Supriya has  the diagnosis of obesity and her BMI today is 42.9. Tatyanna is in the action stage of change.   ADVISE: Arnita was educated on the multiple health risks of obesity as well as the benefit of weight loss to improve her health. She was advised of the need for long term treatment and the importance of lifestyle modifications to improve her current health and to decrease her risk of future health problems.  AGREE: Multiple dietary modification options and treatment options were discussed and  Kamla agreed to follow the recommendations documented in the above note.  ARRANGE: Verle was educated on the importance of frequent visits to treat obesity as outlined per CMS and USPSTF guidelines and agreed to schedule her next follow up appointment today.  Migdalia Dk, am acting as Location manager for [employer], DO  I have reviewed the above documentation for accuracy and completeness, and I agree with the above. -Jearld Lesch, DO

## 2019-04-19 ENCOUNTER — Encounter: Payer: Self-pay | Admitting: Registered Nurse

## 2019-04-19 ENCOUNTER — Other Ambulatory Visit: Payer: Self-pay

## 2019-04-19 ENCOUNTER — Ambulatory Visit: Payer: 59 | Admitting: Registered Nurse

## 2019-04-19 VITALS — BP 140/81 | HR 76 | Temp 97.9°F | Resp 16 | Wt 277.0 lb

## 2019-04-19 DIAGNOSIS — R21 Rash and other nonspecific skin eruption: Secondary | ICD-10-CM

## 2019-04-19 DIAGNOSIS — R42 Dizziness and giddiness: Secondary | ICD-10-CM | POA: Diagnosis not present

## 2019-04-19 DIAGNOSIS — J309 Allergic rhinitis, unspecified: Secondary | ICD-10-CM

## 2019-04-19 MED ORDER — BETAMETHASONE DIPROPIONATE 0.05 % EX CREA: TOPICAL_CREAM | Freq: Two times a day (BID) | CUTANEOUS | 0 refills | Status: DC

## 2019-04-19 MED ORDER — FLUTICASONE PROPIONATE 50 MCG/ACT NA SUSP: 2.0000 | Freq: Every day | NASAL | 6 refills | Status: DC

## 2019-04-19 MED ORDER — MECLIZINE HCL 25 MG PO TABS: 25.0000 mg | ORAL_TABLET | Freq: Three times a day (TID) | ORAL | 0 refills | Status: DC | PRN

## 2019-04-19 MED FILL — FLUTICASONE PROP 50 MCG SPR: 50 | 30 days supply | Qty: 16 | Fill #0

## 2019-04-19 MED FILL — BETAMETHASONE DP 0.05% CRM: 0.05 | 10 days supply | Qty: 30 | Fill #0

## 2019-04-19 MED FILL — MECLIZINE 25 MG TABLET: 25 | 30 days supply | Qty: 90 | Fill #0

## 2019-04-19 NOTE — Progress Notes (Signed)
Established Patient Office Visit  Subjective:  Patient ID: Michelle Huerta, female    DOB: March 23, 1961  Age: 58 y.o. MRN: [id number]  CC:  Chief Complaint  Patient presents with  . Hypertension    pt need to follow-up for HTN and dizziness, sha states it has been better since her las OV     HPI Michelle Huerta presents for 2 week recheck on HTN and dizziness.  She was started on meclizine 25mg  PO qd for vertigo. Has had good effect. She hopes to continue. Reports her HTN has improved. BP borderline but wnl today in office. Denies sxs of HTN. Hoping to continue on her current regimen.   Otherwise, states that taking cetirizine with trazodone has been making her dizzy - she has stopped cetirizine. She has been using fluticasone nasal spray for allergy relief at nighttime, she requests refill.   Past Medical History:  Diagnosis Date  . Allergy   . Arthritis of both knees   . Bilateral chronic knee pain   . Chronic back pain   . Constipation   . DDD (degenerative disc disease), cervical   . Depression    no meds  . Genital herpes    HSV type 2 at rectum, culture positive 3/15  . Glaucoma   . Grade I diastolic dysfunction [id number]   noted on ECHO   . Heart murmur   . History of colon polyps   . HLD (hyperlipidemia)   . Hypertension   . Hypothyroidism   . Insomnia   . Internal hemorrhoids   . Lactose intolerance   . Lupus ([place])   . LVH (left ventricular hypertrophy) 06/25/2015   Mild, noted on ECHO   . Mild sleep apnea    1/17 CPAP Dr Claiborne Billings does not use cpap   . Morbid obesity ([place]) 06/11/2015  . Osteoporosis   . Paget's disease   . Paget's disease of bone    Dr Trudie Reed, Reclast insufion 02/14/10 - bad reaction to infusion, now tolerates fosamax weekly  . Plantar fasciitis, bilateral   . PMB (postmenopausal bleeding)   . Pre-diabetes   . Sleep apnea   . Thrombocytopenia ([place])   . TR (tricuspid regurgitation) 06/25/2015   Trace, noted on ECHO   . Tubular adenoma polyp of  rectum    8/12, Dr Fuller Plan, 5 yr follow up   . Uterine fibroid 10/19/2017   noted on CT pelvis  . Vitamin D deficiency     Past Surgical History:  Procedure Laterality Date  . AUGMENTATION MAMMAPLASTY Bilateral   . BREAST ENHANCEMENT SURGERY    . BREAST REDUCTION SURGERY    . COLONOSCOPY  01/19/2016  . DILATION AND CURETTAGE OF UTERUS N/A 06/24/2015   Procedure: DILATATION AND CURETTAGE;  Surgeon: Emily Filbert, MD;  Location: [facility];  Service: Gynecology;  Laterality: N/A;  . KNEE ARTHROSCOPY WITH SUBCHONDROPLASTY Right 05/03/2018   Procedure: RIGHT KNEE ARTHROSCOPY WITH DEBRIDEMENT, PARTIAL MEDIAL MENISCECTOMY SUBCHONDROPLASTY MEDIAL TIBIAL PLATEAU AND MEDIAL FEMORAL CONDYLE;  Surgeon: Mcarthur Rossetti, MD;  Location: [facility];  Service: Orthopedics;  Laterality: Right;  . REDUCTION MAMMAPLASTY Bilateral   . TOE SURGERY     removal of bone in each foot  . TUBAL LIGATION      Family History  Problem Relation Age of Onset  . Hypertension Mother   . Heart disease Mother   . Hypertension Father   . Hypertension Sister   . Cancer Maternal Grandmother   . Stomach cancer  Paternal Grandmother   . Hypertension Son   . Hypertension Son   . Colon cancer Neg Hx     Social History   Socioeconomic History  . Marital status: Single    Spouse name: Not on file  . Number of children: Not on file  . Years of education: Not on file  . Highest education level: Not on file  Occupational History  . Occupation: Actuary- [employer]  Social Needs  . Financial resource strain: Not on file  . Food insecurity    Worry: Not on file    Inability: Not on file  . Transportation needs    Medical: Not on file    Non-medical: Not on file  Tobacco Use  . Smoking status: Former Smoker    Years: 7.00    Types: Cigars    Quit date: 08/11/2008    Years since quitting: 10.6  . Smokeless tobacco: Never Used  . Tobacco comment: smoked [employer], 1 pack per day (5 in a pack)  Substance and Sexual  Activity  . Alcohol use: Yes    Alcohol/week: 5.0 standard drinks    Types: 5 Cans of beer per week    Comment: occ   . Drug use: Never  . Sexual activity: Not Currently    Partners: Male    Birth control/protection: Surgical  Lifestyle  . Physical activity    Days per week: Not on file    Minutes per session: Not on file  . Stress: Not on file  Relationships  . Social Herbalist on phone: Not on file    Gets together: Not on file    Attends religious service: Not on file    Active member of club or organization: Not on file    Attends meetings of clubs or organizations: Not on file    Relationship status: Not on file  . Intimate partner violence    Fear of current or ex partner: Not on file    Emotionally abused: Not on file    Physically abused: Not on file    Forced sexual activity: Not on file  Other Topics Concern  . Not on file  Social History Narrative   Epworth sleepiness score as of 06/11/15 a 4   Exercise: yes, active at work, no formal exercise   Single, never married, in relationship off and on   Children: [name], [name] (both in [place]), 2 grandkids.  Raised 2 nieces [name], [address] (they have 5 kids)   Occupation: [employer]   Religion: faith important, not active in a church   Seatbelt use: yes    Outpatient Medications Prior to Visit  Medication Sig Dispense Refill  . albuterol (VENTOLIN HFA) 108 (90 Base) MCG/ACT inhaler Inhale 2 puffs into the lungs every 6 (six) hours as needed for wheezing or shortness of breath. 1 Inhaler 0  . alendronate (FOSAMAX) 70 MG tablet alendronate 70 mg tablet    . amLODipine (NORVASC) 10 MG tablet Take 1 tablet (10 mg total) by mouth daily. 90 tablet 1  . amLODipine (NORVASC) 5 MG tablet Take 1 tablet (5 mg total) by mouth daily. 90 tablet 3  . betamethasone dipropionate (DIPROLENE) 0.05 % cream Apply topically 2 (two) times daily. 30 g 0  . cetirizine (ZYRTEC) 10 MG tablet Take 10 mg by  mouth daily.    . cyclobenzaprine (FLEXERIL) 10 MG tablet cyclobenzaprine 10 mg tablet    . diclofenac (VOLTAREN) 75 MG EC tablet  diclofenac sodium 75 mg tablet,delayed release    . Estradiol 10 MCG INST Place 10 mcg vaginally 2 (two) times a week. 12 each 1  . fluticasone (FLONASE) 50 MCG/ACT nasal spray Place 2 sprays into both nostrils daily. 16 g 6  . ibuprofen (ADVIL,MOTRIN) 800 MG tablet Take 1 tablet (800 mg total) by mouth every 8 (eight) hours as needed. 90 tablet 0  . meclizine (ANTIVERT) 25 MG tablet Take 1 tablet (25 mg total) by mouth 3 (three) times daily as needed for dizziness. 30 tablet 0  . MELATONIN PO Take by mouth.    . metFORMIN (GLUCOPHAGE) 500 MG tablet Take 1 tablet (500 mg total) by mouth 2 (two) times daily with a meal. 60 tablet 0  . misoprostol (CYTOTEC) 200 MCG tablet Cytotec 200 mcg tablet  insert one tablet per vagina at bedtime night prior to procedure    . omeprazole (PRILOSEC) 40 MG capsule omeprazole 40 mg capsule,delayed release    . ondansetron (ZOFRAN) 4 MG tablet Take 1 tablet (4 mg total) by mouth every 8 (eight) hours as needed for nausea or vomiting. 20 tablet 0  . telmisartan (MICARDIS) 80 MG tablet Take 1 tablet (80 mg total) by mouth daily. 90 tablet 3  . temazepam (RESTORIL) 30 MG capsule     . traZODone (DESYREL) 50 MG tablet Take 1-2 tablets (50-100 mg total) by mouth at bedtime. 180 tablet 3  . valACYclovir (VALTREX) 500 MG tablet Take 1 tablet (500 mg total) by mouth daily. 90 tablet 3  . Vitamin D, Ergocalciferol, (DRISDOL) 1.25 MG (50000 UT) CAPS capsule Take 1 capsule (50,000 Units total) by mouth every 7 (seven) days. 4 capsule 0   No facility-administered medications prior to visit.     Allergies  Allergen Reactions  . Penicillins Rash and Other (See Comments)    Has patient had a PCN reaction causing immediate rash, facial/tongue/throat swelling, SOB or lightheadedness with hypotension: Yes Has patient had a PCN reaction causing  severe rash involving mucus membranes or skin necrosis: No Has patient had a PCN reaction that required hospitalization: No Has patient had a PCN reaction occurring within the last 10 years: No If all of the above answers are "NO", then may proceed with Cephalosporin use.   [name] Reclast [Zoledronic Acid] Nausea And Vomiting    ROS Review of Systems  Constitutional: Negative.   HENT: Negative.   Eyes: Negative.   Respiratory: Negative.   Cardiovascular: Negative.   Gastrointestinal: Negative.   Endocrine: Negative.   Genitourinary: Negative.   Musculoskeletal: Negative.   Skin: Negative.   Allergic/Immunologic: Negative.   Neurological: Negative.   Hematological: Negative.   Psychiatric/Behavioral: Negative.   All other systems reviewed and are negative.     Objective:    Physical Exam  Constitutional: She is oriented to person, place, and time. She appears well-developed and well-nourished. No distress.  Cardiovascular: Normal rate and regular rhythm.  Pulmonary/Chest: Effort normal. No respiratory distress.  Neurological: She is alert and oriented to person, place, and time.  Skin: Skin is warm and dry. No rash noted. She is not diaphoretic. No erythema. No pallor.  Psychiatric: She has a normal mood and affect. Her behavior is normal. Judgment and thought content normal.  Nursing note and vitals reviewed.   BP 140/81   Pulse 76   Temp 97.9 F (36.6 C) (Oral)   Resp 16   Wt 277 lb (125.6 kg)   LMP 08/01/2012   SpO2 100%  BMI 43.38 kg/m  Wt Readings from Last 3 Encounters:  04/19/19 277 lb (125.6 kg)  04/10/19 274 lb (124.3 kg)  04/08/19 276 lb (125.2 kg)     There are no preventive care reminders to display for this patient.  There are no preventive care reminders to display for this patient.  Lab Results  Component Value Date   TSH 1.410 12/07/2018   Lab Results  Component Value Date   WBC 5.8 12/07/2018   HGB 12.7 12/07/2018   HCT 37.5 12/07/2018    MCV 80 12/07/2018   PLT 138 (L) 12/07/2018   Lab Results  Component Value Date   NA 136 12/07/2018   K 4.2 12/07/2018   CO2 20 12/07/2018   GLUCOSE 89 12/07/2018   BUN 15 12/07/2018   CREATININE 0.92 12/07/2018   BILITOT 0.6 12/07/2018   ALKPHOS 65 12/07/2018   AST 50 (H) 12/07/2018   ALT 56 (H) 12/07/2018   PROT 7.5 12/07/2018   ALBUMIN 3.9 12/07/2018   CALCIUM 8.8 12/07/2018   ANIONGAP 8 12/02/2018   Lab Results  Component Value Date   CHOL 134 05/30/2018   Lab Results  Component Value Date   HDL 75 05/30/2018   Lab Results  Component Value Date   LDLCALC 42 05/30/2018   Lab Results  Component Value Date   TRIG 85 05/30/2018   Lab Results  Component Value Date   CHOLHDL 1.8 05/30/2018   Lab Results  Component Value Date   HGBA1C 5.5 02/07/2019      Assessment & Plan:   Problem List Items Addressed This Visit    None    Visit Diagnoses    Allergic rhinitis, unspecified seasonality, unspecified trigger    -  Primary   Relevant Medications   fluticasone (FLONASE) 50 MCG/ACT nasal spray   Rash and nonspecific skin eruption       Relevant Medications   betamethasone dipropionate 0.05 % cream   Dizzy spells       Relevant Medications   meclizine (ANTIVERT) 25 MG tablet      Meds ordered this encounter  Medications  . betamethasone dipropionate 0.05 % cream    Sig: Apply topically 2 (two) times daily.    Dispense:  30 g    Refill:  0    Order Specific Question:   Supervising Provider    Answer:   Delia Chimes A [id number]  . fluticasone (FLONASE) 50 MCG/ACT nasal spray    Sig: Place 2 sprays into both nostrils daily.    Dispense:  16 g    Refill:  6    Order Specific Question:   Supervising Provider    Answer:   Delia Chimes A [id number]  . meclizine (ANTIVERT) 25 MG tablet    Sig: Take 1 tablet (25 mg total) by mouth 3 (three) times daily as needed for dizziness.    Dispense:  90 tablet    Refill:  0    Order Specific Question:    Supervising Provider    Answer:   Forrest Moron [id number]    Follow-up: No follow-ups on file.   PLAN  Refilled medications  Will consider ENT referral if meclizine loses any effect  Discussed polypharmacy and effect on dizziness  Follow up with PCP as scheduled  Patient encouraged to call clinic with any questions, comments, or concerns.   Maximiano Coss, NP

## 2019-04-19 NOTE — Patient Instructions (Signed)
° ° ° °  If you have lab work done today you will be contacted with your lab results within the next 2 weeks.  If you have not heard from us then please contact us. The fastest way to get your results is to register for My Chart. ° ° °IF you received an x-ray today, you will receive an invoice from [facility]. Please contact [facility] at [phone] with questions or concerns regarding your invoice.  ° °IF you received labwork today, you will receive an invoice from [facility]. Please contact [facility] at [phone] with questions or concerns regarding your invoice.  ° °Our billing staff will not be able to assist you with questions regarding bills from these companies. ° °You will be contacted with the lab results as soon as they are available. The fastest way to get your results is to activate your My Chart account. Instructions are located on the last page of this paperwork. If you have not heard from us regarding the results in 2 weeks, please contact this office. °  ° ° ° °

## 2019-04-24 ENCOUNTER — Ambulatory Visit (INDEPENDENT_AMBULATORY_CARE_PROVIDER_SITE_OTHER): Payer: 59 | Admitting: Bariatrics

## 2019-04-26 ENCOUNTER — Ambulatory Visit: Payer: 59 | Admitting: Family Medicine

## 2019-04-26 ENCOUNTER — Other Ambulatory Visit: Payer: Self-pay

## 2019-04-26 ENCOUNTER — Encounter: Payer: Self-pay | Admitting: Family Medicine

## 2019-04-26 VITALS — BP 132/81 | HR 85 | Temp 98.4°F | Wt 275.4 lb

## 2019-04-26 DIAGNOSIS — R21 Rash and other nonspecific skin eruption: Secondary | ICD-10-CM

## 2019-04-26 DIAGNOSIS — L509 Urticaria, unspecified: Secondary | ICD-10-CM | POA: Diagnosis not present

## 2019-04-26 MED ORDER — PREDNISONE 20 MG PO TABS: 40.0000 mg | ORAL_TABLET | Freq: Every day | ORAL | 0 refills | Status: DC

## 2019-04-26 MED ORDER — TRIAMCINOLONE ACETONIDE 0.1 % EX CREA: 1.0000 "application " | TOPICAL_CREAM | Freq: Two times a day (BID) | CUTANEOUS | 0 refills | Status: DC

## 2019-04-26 MED FILL — TRIAMCINOLONE 0.1% CREAM: 0.1 | 30 days supply | Qty: 30 | Fill #0

## 2019-04-26 NOTE — Progress Notes (Signed)
Subjective:    Patient ID: Michelle Huerta, female    DOB: 1960-10-04, 58 y.o.   MRN: [id number]  HPI Michelle Huerta is a 58 y.o. female Presents today for: Chief Complaint  Patient presents with  . Insect Bite    Bug Bites all over legs for 3 days. Itchy, red,   Leg rash: Started with itching on lower legs few days ago. Some bumps on chest/stomach that went away.  Pink spot a day or so later - 2 days ago. Whelps on stomach today. Improved since earlier this am.  Rash itchy.  No malaise/fever.  Feels like hives. No prior hx of hives.  No new soap/dermatologic products, no new detergent.  ? Possible new fabric softener.  No new meds. Dizziness from last visit resolved. Not taking meclizine.  No pets at home.  No sick contacts at home.   Tx: none.    Patient Active Problem List   Diagnosis Date Noted  . Uterine leiomyoma 03/11/2019  . Fatigue 12/06/2018  . Essential hypertension 12/06/2018  . Arterial hypotension 12/06/2018  . Nausea in adult 12/06/2018  . Anorexia 12/06/2018  . Other insomnia 12/06/2018  . Acute recurrent sinusitis 12/05/2018  . Vitamin D deficiency 07/26/2018  . Class 3 severe obesity with serious comorbidity and body mass index (BMI) of 45.0 to 49.9 in adult ([place]) 07/26/2018  . Status post arthroscopic surgery of right knee 05/09/2018  . Chronic pain of right knee 05/09/2018  . Closed fracture of right tibial plateau   . Unilateral primary osteoarthritis, right knee 04/23/2018  . Acute medial meniscus tear of right knee 04/23/2018  . Closed fracture of medial plateau of left tibia 04/23/2018  . Severe obesity (BMI >= 40) ([place]) 04/23/2018  . Osteoarthritis of right knee 03/09/2018  . Prediabetes 03/09/2018  . Bilateral knee pain 12/28/2017  . Constipation 12/18/2017  . Cough 12/15/2017  . Lower resp. tract infection 12/15/2017  . Acute conjunctivitis of right eye 12/15/2017  . H/O Paget's disease of bone 09/10/2017  . Hypertensive disorder 08/12/2016   . OSA on CPAP 10/28/2015  . Fibroids 08/07/2015  . Plantar fasciitis, bilateral 08/03/2015  . Metatarsal deformity 08/03/2015  . Equinus deformity of foot, acquired 08/03/2015  . Pronation deformity of both feet 08/03/2015  . Morbid obesity ([place]) 06/11/2015  . 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 Essential (primary) hypertension 06/11/2015  . 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 Benign essential HTN 09/19/2013  . Major depressive episode 09/19/2013   Past Medical History:  Diagnosis Date  . Allergy   . Arthritis of both knees   . Bilateral chronic knee pain   . Chronic back pain   . Constipation   . DDD (degenerative disc disease), cervical   . Depression    no meds  . Genital herpes    HSV type 2 at rectum, culture positive 3/15  . Glaucoma   . Grade I diastolic dysfunction [id number]   noted on ECHO   . Heart murmur   . History of colon polyps   . HLD (hyperlipidemia)   . Hypertension   . Hypothyroidism   . Insomnia   . Internal hemorrhoids   . Lactose intolerance   . Lupus ([place])   . LVH (left ventricular hypertrophy) 06/25/2015   Mild, noted on ECHO   . Mild sleep apnea    1/17 CPAP Dr Claiborne Billings does not use cpap   . Morbid obesity ([place]) 06/11/2015  . Osteoporosis   . Paget's  disease   . Paget's disease of bone    Dr Trudie Reed, Reclast insufion 02/14/10 - bad reaction to infusion, now tolerates fosamax weekly  . Plantar fasciitis, bilateral   . PMB (postmenopausal bleeding)   . Pre-diabetes   . Sleep apnea   . Thrombocytopenia ([place])   . TR (tricuspid regurgitation) 06/25/2015   Trace, noted on ECHO   . Tubular adenoma polyp of rectum    8/12, Dr Fuller Plan, 5 yr follow up   . Uterine fibroid 10/19/2017   noted on CT pelvis  . Vitamin D deficiency    Past Surgical History:  Procedure Laterality Date  . AUGMENTATION MAMMAPLASTY Bilateral   . BREAST ENHANCEMENT SURGERY    . BREAST REDUCTION SURGERY    . COLONOSCOPY  01/19/2016  . DILATION AND CURETTAGE OF  UTERUS N/A 06/24/2015   Procedure: DILATATION AND CURETTAGE;  Surgeon: Emily Filbert, MD;  Location: [facility];  Service: Gynecology;  Laterality: N/A;  . KNEE ARTHROSCOPY WITH SUBCHONDROPLASTY Right 05/03/2018   Procedure: RIGHT KNEE ARTHROSCOPY WITH DEBRIDEMENT, PARTIAL MEDIAL MENISCECTOMY SUBCHONDROPLASTY MEDIAL TIBIAL PLATEAU AND MEDIAL FEMORAL CONDYLE;  Surgeon: Mcarthur Rossetti, MD;  Location: [facility];  Service: Orthopedics;  Laterality: Right;  . REDUCTION MAMMAPLASTY Bilateral   . TOE SURGERY     removal of bone in each foot  . TUBAL LIGATION     Allergies  Allergen Reactions  . Penicillins Rash and Other (See Comments)    Has patient had a PCN reaction causing immediate rash, facial/tongue/throat swelling, SOB or lightheadedness with hypotension: Yes Has patient had a PCN reaction causing severe rash involving mucus membranes or skin necrosis: No Has patient had a PCN reaction that required hospitalization: No Has patient had a PCN reaction occurring within the last 10 years: No If all of the above answers are "NO", then may proceed with Cephalosporin use.   [name] Reclast [Zoledronic Acid] Nausea And Vomiting   Prior to Admission medications   Medication Sig Start Date End Date Taking? Authorizing Provider  albuterol (VENTOLIN HFA) 108 (90 Base) MCG/ACT inhaler Inhale 2 puffs into the lungs every 6 (six) hours as needed for wheezing or shortness of breath. 12/10/18  Yes Forrest Moron, MD  alendronate (FOSAMAX) 70 MG tablet alendronate 70 mg tablet   Yes [provider]  amLODipine (NORVASC) 10 MG tablet Take 1 tablet (10 mg total) by mouth daily. 04/08/19  Yes Maximiano Coss, NP  amLODipine (NORVASC) 5 MG tablet Take 1 tablet (5 mg total) by mouth daily. 04/26/18  Yes McVey, Gelene Mink, PA-C  betamethasone dipropionate 0.05 % cream Apply topically 2 (two) times daily. 04/19/19  Yes Maximiano Coss, NP  cetirizine (ZYRTEC) 10 MG tablet Take 10 mg by mouth daily.   Yes  [provider]  cyclobenzaprine (FLEXERIL) 10 MG tablet cyclobenzaprine 10 mg tablet   Yes [provider]  diclofenac (VOLTAREN) 75 MG EC tablet diclofenac sodium 75 mg tablet,delayed release   Yes [provider]  Estradiol 10 MCG INST Place 10 mcg vaginally 2 (two) times a week. 07/19/18  Yes Wendie Agreste, MD  fluticasone (FLONASE) 50 MCG/ACT nasal spray Place 2 sprays into both nostrils daily. 04/19/19  Yes Maximiano Coss, NP  ibuprofen (ADVIL,MOTRIN) 800 MG tablet Take 1 tablet (800 mg total) by mouth every 8 (eight) hours as needed. 05/04/18  Yes Mcarthur Rossetti, MD  meclizine (ANTIVERT) 25 MG tablet Take 1 tablet (25 mg total) by mouth 3 (three) times daily as needed  for dizziness. 04/19/19  Yes Maximiano Coss, NP  MELATONIN PO Take by mouth.   Yes [provider]  metFORMIN (GLUCOPHAGE) 500 MG tablet Take 1 tablet (500 mg total) by mouth 2 (two) times daily with a meal. 04/01/19  Yes Jearld Lesch A, DO  misoprostol (CYTOTEC) 200 MCG tablet Cytotec 200 mcg tablet  insert one tablet per vagina at bedtime night prior to procedure   Yes [provider]  omeprazole (PRILOSEC) 40 MG capsule omeprazole 40 mg capsule,delayed release   Yes [provider]  ondansetron (ZOFRAN) 4 MG tablet Take 1 tablet (4 mg total) by mouth every 8 (eight) hours as needed for nausea or vomiting. 04/08/19  Yes Maximiano Coss, NP  telmisartan (MICARDIS) 80 MG tablet Take 1 tablet (80 mg total) by mouth daily. 04/26/18  Yes McVey, Gelene Mink, PA-C  temazepam (RESTORIL) 30 MG capsule  09/17/18  Yes [provider]  traZODone (DESYREL) 50 MG tablet Take 1-2 tablets (50-100 mg total) by mouth at bedtime. 02/11/19  Yes Forrest Moron, MD  valACYclovir (VALTREX) 500 MG tablet Take 1 tablet (500 mg total) by mouth daily. 04/26/18  Yes McVey, Gelene Mink, PA-C  Vitamin D, Ergocalciferol, (DRISDOL) 1.25 MG (50000 UT) CAPS capsule Take 1 capsule  (50,000 Units total) by mouth every 7 (seven) days. 04/01/19  Yes Georgia Lopes, DO   Social History   Socioeconomic History  . Marital status: Single    Spouse name: Not on file  . Number of children: Not on file  . Years of education: Not on file  . Highest education level: Not on file  Occupational History  . Occupation: Actuary- [employer]  Social Needs  . Financial resource strain: Not on file  . Food insecurity    Worry: Not on file    Inability: Not on file  . Transportation needs    Medical: Not on file    Non-medical: Not on file  Tobacco Use  . Smoking status: Former Smoker    Years: 7.00    Types: Cigars    Quit date: 08/11/2008    Years since quitting: 10.7  . Smokeless tobacco: Never Used  . Tobacco comment: smoked [employer], 1 pack per day (5 in a pack)  Substance and Sexual Activity  . Alcohol use: Yes    Alcohol/week: 5.0 standard drinks    Types: 5 Cans of beer per week    Comment: occ   . Drug use: Never  . Sexual activity: Not Currently    Partners: Male    Birth control/protection: Surgical  Lifestyle  . Physical activity    Days per week: Not on file    Minutes per session: Not on file  . Stress: Not on file  Relationships  . Social Herbalist on phone: Not on file    Gets together: Not on file    Attends religious service: Not on file    Active member of club or organization: Not on file    Attends meetings of clubs or organizations: Not on file    Relationship status: Not on file  . Intimate partner violence    Fear of current or ex partner: Not on file    Emotionally abused: Not on file    Physically abused: Not on file    Forced sexual activity: Not on file  Other Topics Concern  . Not on file  Social History Narrative   Epworth sleepiness score as of 06/11/15 a 4  Exercise: yes, active at work, no formal exercise   Single, never married, in relationship off and on   Children: Dellis Filbert, [place] (both in [place]), 2  grandkids.  Raised 2 nieces [name], [address] (they have 5 kids)   Occupation: [employer]   Religion: faith important, not active in a church   Seatbelt use: yes    Review of Systems  Constitutional: Negative for chills and fever.  Respiratory: Negative for shortness of breath, wheezing and stridor.        Objective:   Physical Exam Vitals signs reviewed.  Constitutional:      Appearance: She is well-developed.  HENT:     Head: Normocephalic and atraumatic.  Eyes:     Conjunctiva/sclera: Conjunctivae normal.     Pupils: Pupils are equal, round, and reactive to light.  Neck:     Vascular: No carotid bruit.  Cardiovascular:     Rate and Rhythm: Normal rate and regular rhythm.     Heart sounds: Normal heart sounds.  Pulmonary:     Effort: Pulmonary effort is normal.     Breath sounds: Normal breath sounds. No stridor. No wheezing.  Abdominal:     Palpations: There is no pulsatile mass.  Skin:    General: Skin is warm and dry.     Findings: Rash (Urticarial lesions across abdomen, faint.  Few excoriated patches right lower leg.  No petechiae/purpura.) present.  Neurological:     Mental Status: She is alert and oriented to person, place, and time.  Psychiatric:        Behavior: Behavior normal.    Vitals:   04/26/19 0816  BP: 132/81  Pulse: 85  Temp: 98.4 F (36.9 C)  TempSrc: Oral  SpO2: 98%  Weight: 275 lb 6.4 oz (124.9 kg)         Assessment & Plan:   Michelle Huerta is a 58 y.o. female Urticaria - Plan: predniSONE (DELTASONE) 20 MG tablet, triamcinolone cream (KENALOG) 0.1 %  Rash and nonspecific skin eruption - Plan: triamcinolone cream (KENALOG) 0.1 % Appears to be allergic urticaria without known specific cause.  No respiratory symptoms.  -Benadryl over-the-counter every 4-6 hours as needed for itching, triamcinolone topical as needed for itching as well as Aveeno.  Once improving can change Benadryl to Zyrtec.  If not having  significant improvement in the next 24 to 48 hours start prednisone, potential side effects and risks were discussed. RTC precautions Meds ordered this encounter  Medications  . predniSONE (DELTASONE) 20 MG tablet    Sig: Take 2 tablets (40 mg total) by mouth daily with breakfast.    Dispense:  6 tablet    Refill:  0  . triamcinolone cream (KENALOG) 0.1 %    Sig: Apply 1 application topically 2 (two) times daily.    Dispense:  30 g    Refill:  0   Patient Instructions       If you have lab work done today you will be contacted with your lab results within the next 2 weeks.  If you have not heard from [place] then please contact us. The fastest way to get your results is to register for My Chart.   IF you received an x-ray today, you will receive an invoice from [facility]. Please contact [facility] at [phone] with questions or concerns regarding your invoice.   IF you received labwork today, you will receive an invoice from [facility]. Please contact [facility] at [phone] with questions  or concerns regarding your invoice.   Our billing staff will not be able to assist you with questions regarding bills from these companies.  You will be contacted with the lab results as soon as they are available. The fastest way to get your results is to activate your My Chart account. Instructions are located on the last page of this paperwork. If you have not heard from [place] regarding the results in 2 weeks, please contact this office.       Signed,   Merri Ray, MD [facility].  04/26/19 8:46 AM

## 2019-04-26 NOTE — Patient Instructions (Addendum)
Rash appears to be possible urticaria or an reaction to an allergen.  Start Benadryl over-the-counter every 4-6 hours, then as itching improves and rash improves can change to cetirizine or Zyrtec 10 mg once per day.  Aveeno lotion for dry or itchy areas is fine.  Triamcinolone lotion 2-3 times per day to itchy spots as needed.  If not improving in the next 24 to 48 hours, can fill the prescription for prednisone.  Return to the clinic or go to the nearest emergency room if any of your symptoms worsen or new symptoms occur.   If you have lab work done today you will be contacted with your lab results within the next 2 weeks.  If you have not heard from [place] then please contact us. The fastest way to get your results is to register for My Chart.   IF you received an x-ray today, you will receive an invoice from [facility]. Please contact [facility] at [phone] with questions or concerns regarding your invoice.   IF you received labwork today, you will receive an invoice from [facility]. Please contact [facility] at [phone] with questions or concerns regarding your invoice.   Our billing staff will not be able to assist you with questions regarding bills from these companies.  You will be contacted with the lab results as soon as they are available. The fastest way to get your results is to activate your My Chart account. Instructions are located on the last page of this paperwork. If you have not heard from [place] regarding the results in 2 weeks, please contact this office.      Pruritus Pruritus is an itchy feeling on the skin. One of the most common causes is dry skin, but many different things can cause itching. Most cases of itching do not require medical attention. Sometimes itchy skin can turn into a rash. Follow these instructions at home: Skin care   Apply moisturizing lotion to your skin as needed. Lotion that contains petroleum jelly is best.  Take  medicines or apply medicated creams only as told by your health care provider. This may include: ? Corticosteroid cream. ? Anti-itch lotions. ? Oral antihistamines.  Apply a cool, wet cloth (cool compress) to the affected areas.  Take baths with one of the following: ? Epsom salts. You can get these at your local pharmacy or grocery store. Follow the instructions on the packaging. ? Baking soda. Pour a small amount into the bath as told by your health care provider. ? Colloidal oatmeal. You can get this at your local pharmacy or grocery store. Follow the instructions on the packaging.  Apply baking soda paste to your skin. To make the paste, stir water into a small amount of baking soda until it reaches a paste-like consistency.  Do not scratch your skin.  Do not take hot showers or baths, which can make itching worse. A cool shower may help with itching as long as you apply moisturizing lotion after the shower.  Do not use scented soaps, detergents, perfumes, and cosmetic products. Instead, use gentle, unscented versions of these items. General instructions  Avoid wearing tight clothes.  Keep a journal to help find out what is causing your itching. Write down: ? What you eat and drink. ? What cosmetic products you use. ? What soaps or detergents you use. ? What you wear, including jewelry.  Use a humidifier. This keeps the air moist, which helps to prevent dry skin.  Be aware of  any changes in your itchiness. Contact a health care provider if:  The itching does not go away after several days.  You are unusually thirsty or urinating more than normal.  Your skin tingles or feels numb.  Your skin or the white parts of your eyes turn yellow (jaundice).  You feel weak.  You have any of the following: ? Night sweats. ? Tiredness (fatigue). ? Weight loss. ? Abdominal pain. Summary  Pruritus is an itchy feeling on the skin. One of the most common causes is dry skin, but  many different conditions and factors can cause itching.  Apply moisturizing lotion to your skin as needed. Lotion that contains petroleum jelly is best.  Take medicines or apply medicated creams only as told by your health care provider.  Do not take hot showers or baths. Do not use scented soaps, detergents, perfumes, or cosmetic products. This information is not intended to replace advice given to you by your health care provider. Make sure you discuss any questions you have with your health care provider. Document Released: 03/09/2011 Document Revised: 07/11/2017 Document Reviewed: 07/11/2017 Elsevier Patient Education  2020 [employer].

## 2019-04-29 ENCOUNTER — Other Ambulatory Visit: Payer: Self-pay

## 2019-04-29 ENCOUNTER — Ambulatory Visit: Payer: 59 | Admitting: Registered Nurse

## 2019-04-29 ENCOUNTER — Encounter: Payer: Self-pay | Admitting: Registered Nurse

## 2019-04-29 DIAGNOSIS — G4700 Insomnia, unspecified: Secondary | ICD-10-CM | POA: Diagnosis not present

## 2019-04-29 DIAGNOSIS — R21 Rash and other nonspecific skin eruption: Secondary | ICD-10-CM

## 2019-04-29 DIAGNOSIS — G4733 Obstructive sleep apnea (adult) (pediatric): Secondary | ICD-10-CM | POA: Diagnosis not present

## 2019-04-29 MED ORDER — BETAMETHASONE DIPROPIONATE 0.05 % EX CREA: TOPICAL_CREAM | Freq: Two times a day (BID) | CUTANEOUS | 0 refills | Status: DC

## 2019-04-29 MED FILL — BETAMETHASONE DP 0.05% CRM: 0.05 | 10 days supply | Qty: 30 | Fill #0

## 2019-04-29 NOTE — Patient Instructions (Signed)
° ° ° °  If you have lab work done today you will be contacted with your lab results within the next 2 weeks.  If you have not heard from us then please contact us. The fastest way to get your results is to register for My Chart. ° ° °IF you received an x-ray today, you will receive an invoice from [facility]. Please contact [facility] at [phone] with questions or concerns regarding your invoice.  ° °IF you received labwork today, you will receive an invoice from [facility]. Please contact [facility] at [phone] with questions or concerns regarding your invoice.  ° °Our billing staff will not be able to assist you with questions regarding bills from these companies. ° °You will be contacted with the lab results as soon as they are available. The fastest way to get your results is to activate your My Chart account. Instructions are located on the last page of this paperwork. If you have not heard from us regarding the results in 2 weeks, please contact this office. °  ° ° ° °

## 2019-04-29 NOTE — Progress Notes (Signed)
Acute Office Visit  Subjective:    Patient ID: Michelle Huerta, female    DOB: 1960-08-21, 58 y.o.   MRN: [id number]  Chief Complaint  Patient presents with  . rash on stomach    f/u     HPI Patient is in today for a follow up on her rash. She was seen last week by Dr. Carlota Raspberry for this issue.  She notes an urticarial rash on her trunk when she wakes up. By noon, it is resolved. She has tried topical and PO steroids for this rash but it continues to recur. It is itchy and irritating. It is largely on her anterior trunk. No discharge or vesicles. No known exposure to allergens - did get a new mattress recently, has not been using her old mattress protector that she has.  States that it does not spread to her arms, legs, face, or genitals.  Past Medical History:  Diagnosis Date  . Allergy   . Arthritis of both knees   . Bilateral chronic knee pain   . Chronic back pain   . Constipation   . DDD (degenerative disc disease), cervical   . Depression    no meds  . Genital herpes    HSV type 2 at rectum, culture positive 3/15  . Glaucoma   . Grade I diastolic dysfunction [id number]   noted on ECHO   . Heart murmur   . History of colon polyps   . HLD (hyperlipidemia)   . Hypertension   . Hypothyroidism   . Insomnia   . Internal hemorrhoids   . Lactose intolerance   . Lupus ([place])   . LVH (left ventricular hypertrophy) 06/25/2015   Mild, noted on ECHO   . Mild sleep apnea    1/17 CPAP Dr Claiborne Billings does not use cpap   . Morbid obesity ([place]) 06/11/2015  . Osteoporosis   . Paget's disease   . Paget's disease of bone    Dr Trudie Reed, Reclast insufion 02/14/10 - bad reaction to infusion, now tolerates fosamax weekly  . Plantar fasciitis, bilateral   . PMB (postmenopausal bleeding)   . Pre-diabetes   . Sleep apnea   . Thrombocytopenia ([place])   . TR (tricuspid regurgitation) 06/25/2015   Trace, noted on ECHO   . Tubular adenoma polyp of rectum    8/12, Dr Fuller Plan, 5 yr follow up   . Uterine  fibroid 10/19/2017   noted on CT pelvis  . Vitamin D deficiency     Past Surgical History:  Procedure Laterality Date  . AUGMENTATION MAMMAPLASTY Bilateral   . BREAST ENHANCEMENT SURGERY    . BREAST REDUCTION SURGERY    . COLONOSCOPY  01/19/2016  . DILATION AND CURETTAGE OF UTERUS N/A 06/24/2015   Procedure: DILATATION AND CURETTAGE;  Surgeon: Emily Filbert, MD;  Location: [facility];  Service: Gynecology;  Laterality: N/A;  . KNEE ARTHROSCOPY WITH SUBCHONDROPLASTY Right 05/03/2018   Procedure: RIGHT KNEE ARTHROSCOPY WITH DEBRIDEMENT, PARTIAL MEDIAL MENISCECTOMY SUBCHONDROPLASTY MEDIAL TIBIAL PLATEAU AND MEDIAL FEMORAL CONDYLE;  Surgeon: Mcarthur Rossetti, MD;  Location: [facility];  Service: Orthopedics;  Laterality: Right;  . REDUCTION MAMMAPLASTY Bilateral   . TOE SURGERY     removal of bone in each foot  . TUBAL LIGATION      Family History  Problem Relation Age of Onset  . Hypertension Mother   . Heart disease Mother   . Hypertension Father   . Hypertension Sister   . Cancer Maternal Grandmother   .  Stomach cancer Paternal Grandmother   . Hypertension Son   . Hypertension Son   . Colon cancer Neg Hx     Social History   Socioeconomic History  . Marital status: Single    Spouse name: Not on file  . Number of children: Not on file  . Years of education: Not on file  . Highest education level: Not on file  Occupational History  . Occupation: Actuary- [employer]  Social Needs  . Financial resource strain: Not on file  . Food insecurity    Worry: Not on file    Inability: Not on file  . Transportation needs    Medical: Not on file    Non-medical: Not on file  Tobacco Use  . Smoking status: Former Smoker    Years: 7.00    Types: Cigars    Quit date: 08/11/2008    Years since quitting: 10.7  . Smokeless tobacco: Never Used  . Tobacco comment: smoked Black&Milds, 1 pack per day (5 in a pack)  Substance and Sexual Activity  . Alcohol use: Yes    Alcohol/week: 5.0  standard drinks    Types: 5 Cans of beer per week    Comment: occ   . Drug use: Never  . Sexual activity: Not Currently    Partners: Male    Birth control/protection: Surgical  Lifestyle  . Physical activity    Days per week: Not on file    Minutes per session: Not on file  . Stress: Not on file  Relationships  . Social Herbalist on phone: Not on file    Gets together: Not on file    Attends religious service: Not on file    Active member of club or organization: Not on file    Attends meetings of clubs or organizations: Not on file    Relationship status: Not on file  . Intimate partner violence    Fear of current or ex partner: Not on file    Emotionally abused: Not on file    Physically abused: Not on file    Forced sexual activity: Not on file  Other Topics Concern  . Not on file  Social History Narrative   Epworth sleepiness score as of 06/11/15 a 4   Exercise: yes, active at work, no formal exercise   Single, never married, in relationship off and on   Children: [place], [place] (both in [place]), 2 grandkids.  Raised 2 nieces [name], [address] (they have 5 kids)   Occupation: [employer]   Religion: faith important, not active in a church   Seatbelt use: yes    Outpatient Medications Prior to Visit  Medication Sig Dispense Refill  . albuterol (VENTOLIN HFA) 108 (90 Base) MCG/ACT inhaler Inhale 2 puffs into the lungs every 6 (six) hours as needed for wheezing or shortness of breath. 1 Inhaler 0  . amLODipine (NORVASC) 10 MG tablet Take 1 tablet (10 mg total) by mouth daily. 90 tablet 1  . Estradiol 10 MCG INST Place 10 mcg vaginally 2 (two) times a week. 12 each 1  . fluticasone (FLONASE) 50 MCG/ACT nasal spray Place 2 sprays into both nostrils daily. 16 g 6  . ibuprofen (ADVIL,MOTRIN) 800 MG tablet Take 1 tablet (800 mg total) by mouth every 8 (eight) hours as needed. 90 tablet 0  . meclizine (ANTIVERT) 25 MG tablet Take 1 tablet (25  mg total) by mouth 3 (three) times daily as needed for dizziness.  90 tablet 0  . metFORMIN (GLUCOPHAGE) 500 MG tablet Take 1 tablet (500 mg total) by mouth 2 (two) times daily with a meal. 60 tablet 0  . telmisartan (MICARDIS) 80 MG tablet Take 1 tablet (80 mg total) by mouth daily. 90 tablet 3  . temazepam (RESTORIL) 30 MG capsule     . traZODone (DESYREL) 50 MG tablet Take 1-2 tablets (50-100 mg total) by mouth at bedtime. 180 tablet 3  . triamcinolone cream (KENALOG) 0.1 % Apply 1 application topically 2 (two) times daily. 30 g 0  . valACYclovir (VALTREX) 500 MG tablet Take 1 tablet (500 mg total) by mouth daily. 90 tablet 3  . Vitamin D, Ergocalciferol, (DRISDOL) 1.25 MG (50000 UT) CAPS capsule Take 1 capsule (50,000 Units total) by mouth every 7 (seven) days. 4 capsule 0  . betamethasone dipropionate 0.05 % cream Apply topically 2 (two) times daily. 30 g 0  . MELATONIN PO Take by mouth.    [name] omeprazole (PRILOSEC) 40 MG capsule omeprazole 40 mg capsule,delayed release    . ondansetron (ZOFRAN) 4 MG tablet Take 1 tablet (4 mg total) by mouth every 8 (eight) hours as needed for nausea or vomiting. (Patient not taking: Reported on 04/29/2019) 20 tablet 0  . cetirizine (ZYRTEC) 10 MG tablet Take 10 mg by mouth daily.    . predniSONE (DELTASONE) 20 MG tablet Take 2 tablets (40 mg total) by mouth daily with breakfast. (Patient not taking: Reported on 04/29/2019) 6 tablet 0   No facility-administered medications prior to visit.     Allergies  Allergen Reactions  . Penicillins Rash and Other (See Comments)    Has patient had a PCN reaction causing immediate rash, facial/tongue/throat swelling, SOB or lightheadedness with hypotension: Yes Has patient had a PCN reaction causing severe rash involving mucus membranes or skin necrosis: No Has patient had a PCN reaction that required hospitalization: No Has patient had a PCN reaction occurring within the last 10 years: No If all of the above answers  are "NO", then may proceed with Cephalosporin use.   [name] Reclast [Zoledronic Acid] Nausea And Vomiting    Review of Systems  Constitutional: Negative.   HENT: Negative.   Eyes: Negative.   Respiratory: Negative.   Cardiovascular: Negative.   Gastrointestinal: Negative.   Genitourinary: Negative.   Musculoskeletal: Negative.   Skin: Negative.   Endo/Heme/Allergies: Negative.   Psychiatric/Behavioral: Negative.   All other systems reviewed and are negative.      Objective:    Physical Exam  Constitutional: She is oriented to person, place, and time. She appears well-developed and well-nourished. No distress.  Cardiovascular: Normal rate and regular rhythm.  Pulmonary/Chest: Effort normal. No respiratory distress.  Neurological: She is oriented to person, place, and time.  Skin: Skin is warm and dry. No rash noted. She is not diaphoretic. No erythema. No pallor.  Psychiatric: She has a normal mood and affect. Her behavior is normal. Judgment and thought content normal.  Nursing note and vitals reviewed.   BP 136/86 (BP Location: Right Arm, Patient Position: Sitting, Cuff Size: Large)   Pulse 76   Temp 98.2 F (36.8 C) (Oral)   Ht 5\' 7"  (1.702 m)   Wt 279 lb 6.4 oz (126.7 kg)   LMP 08/01/2012   SpO2 100%   BMI 43.76 kg/m  Wt Readings from Last 3 Encounters:  04/29/19 279 lb 6.4 oz (126.7 kg)  04/26/19 275 lb 6.4 oz (124.9 kg)  04/19/19 277 lb (125.6 kg)  There are no preventive care reminders to display for this patient.  There are no preventive care reminders to display for this patient.   Lab Results  Component Value Date   TSH 1.410 12/07/2018   Lab Results  Component Value Date   WBC 5.8 12/07/2018   HGB 12.7 12/07/2018   HCT 37.5 12/07/2018   MCV 80 12/07/2018   PLT 138 (L) 12/07/2018   Lab Results  Component Value Date   NA 136 12/07/2018   K 4.2 12/07/2018   CO2 20 12/07/2018   GLUCOSE 89 12/07/2018   BUN 15 12/07/2018   CREATININE 0.92  12/07/2018   BILITOT 0.6 12/07/2018   ALKPHOS 65 12/07/2018   AST 50 (H) 12/07/2018   ALT 56 (H) 12/07/2018   PROT 7.5 12/07/2018   ALBUMIN 3.9 12/07/2018   CALCIUM 8.8 12/07/2018   ANIONGAP 8 12/02/2018   Lab Results  Component Value Date   CHOL 134 05/30/2018   Lab Results  Component Value Date   HDL 75 05/30/2018   Lab Results  Component Value Date   LDLCALC 42 05/30/2018   Lab Results  Component Value Date   TRIG 85 05/30/2018   Lab Results  Component Value Date   CHOLHDL 1.8 05/30/2018   Lab Results  Component Value Date   HGBA1C 5.5 02/07/2019       Assessment & Plan:   Problem List Items Addressed This Visit    None    Visit Diagnoses    Rash and nonspecific skin eruption       Relevant Medications   betamethasone dipropionate 0.05 % cream   Other Relevant Orders   Ambulatory referral to Allergy   Ambulatory referral to Dermatology       Meds ordered this encounter  Medications  . betamethasone dipropionate 0.05 % cream    Sig: Apply topically 2 (two) times daily.    Dispense:  30 g    Refill:  0    Order Specific Question:   Supervising Provider    Answer:   Forrest Moron [id number]    PLAN  Refill betamethasone  Refer to allergy  Refer to derm  Patient encouraged to call clinic with any questions, comments, or concerns.   Maximiano Coss, NP

## 2019-05-07 ENCOUNTER — Telehealth: Payer: Self-pay

## 2019-05-07 NOTE — Telephone Encounter (Signed)
Received fmla forms for pt, has been out of wk since Oct 2019 per pt due to knee. FMLA given to Dr Nolon Rod for completion

## 2019-05-08 ENCOUNTER — Encounter: Payer: Self-pay | Admitting: Allergy

## 2019-05-08 ENCOUNTER — Ambulatory Visit (INDEPENDENT_AMBULATORY_CARE_PROVIDER_SITE_OTHER): Payer: 59 | Admitting: Bariatrics

## 2019-05-08 ENCOUNTER — Other Ambulatory Visit: Payer: Self-pay

## 2019-05-08 ENCOUNTER — Ambulatory Visit: Payer: 59 | Admitting: Allergy

## 2019-05-08 VITALS — BP 130/82 | HR 81 | Temp 97.6°F | Resp 20 | Ht 66.0 in | Wt 276.8 lb

## 2019-05-08 DIAGNOSIS — L508 Other urticaria: Secondary | ICD-10-CM

## 2019-05-08 DIAGNOSIS — R21 Rash and other nonspecific skin eruption: Secondary | ICD-10-CM | POA: Diagnosis not present

## 2019-05-08 DIAGNOSIS — R0602 Shortness of breath: Secondary | ICD-10-CM

## 2019-05-08 MED ORDER — PIMECROLIMUS 1 % EX CREA: TOPICAL_CREAM | Freq: Two times a day (BID) | CUTANEOUS | 5 refills | Status: DC

## 2019-05-08 MED ORDER — BETAMETHASONE DIPROPIONATE 0.05 % EX CREA: TOPICAL_CREAM | Freq: Two times a day (BID) | CUTANEOUS | 5 refills | Status: DC

## 2019-05-08 MED ORDER — FAMOTIDINE 20 MG PO TABS: 20.0000 mg | ORAL_TABLET | Freq: Two times a day (BID) | ORAL | 5 refills | Status: DC

## 2019-05-08 MED FILL — FAMOTIDINE 20 MG TABLET: 20 | 30 days supply | Qty: 60 | Fill #0

## 2019-05-08 MED FILL — PIMECROLIMUS 1 % CREA: 1 | 15 days supply | Qty: 30 | Fill #0

## 2019-05-08 MED FILL — BETAMETHASONE DP 0.05% CRM: 0.05 | 30 days supply | Qty: 45 | Fill #0

## 2019-05-08 NOTE — Progress Notes (Signed)
New Patient Note  RE: Michelle Huerta MRN: [id number] DOB: 10-24-1960 Date of Office Visit: 05/08/2019  Referring provider: Maximiano Coss, NP Primary care provider: Forrest Moron, MD  Chief Complaint: rash  History of present illness: Michelle Huerta is a 58 y.o. female presenting today for consultation for rash.   She has had a rash that she states is gone now but she is still itchy.   She states the rash looked like hives.  The rash started last week and states was present around 4 days.  She states she got a new bed set and did not wash the sheets before she put them on the bed.  She believes she may have developed hives from this new bedding.  After she broke out she did take it off and washed them.  She has not had any further hives since the sheets have been washed.  She also states she got a new mattress and new bedroom set as well.  She did see her PCP regarding this rash and was prescribed betamethasone cream but states this didn't help.  She also states that she was only given a small tube and used this up quickly.  She reports taking benadryl but also unsure if this helped.  Hives did not leave behind any marks/bruising.  No swelling.  No fevers.  No joint aches/pains.   She also reports having a different rash on her right lower that is also itchy but looks different than the hives she had previously.   She is not sure if she had bug bites or something else causing this rash.  Denies any fevers.  No traveling.  No URI symptoms.  She does report having shortness of breath occasionally that is attributed to smoking.  She does have an albuterol inhaler for this.   Denies any significant nasal, ocular or generalized allergy symptoms.  No history of eczema or food allergy.   Review of systems: Review of Systems  Constitutional: Negative for chills, fever and malaise/fatigue.  HENT: Negative for congestion, ear discharge, nosebleeds and sore throat.   Eyes: Negative for pain,  discharge and redness.  Respiratory: Positive for shortness of breath. Negative for cough and wheezing.   Cardiovascular: Negative.   Gastrointestinal: Negative.   Skin: Positive for itching and rash.  Neurological: Negative.     All other systems negative unless noted above in HPI  Past medical history: Past Medical History:  Diagnosis Date  . Allergy   . Arthritis of both knees   . Asthma   . Bilateral chronic knee pain   . Chronic back pain   . Constipation   . DDD (degenerative disc disease), cervical   . Depression    no meds  . Genital herpes    HSV type 2 at rectum, culture positive 3/15  . Glaucoma   . Grade I diastolic dysfunction [id number]   noted on ECHO   . Heart murmur   . History of colon polyps   . HLD (hyperlipidemia)   . Hypertension   . Hypothyroidism   . Insomnia   . Internal hemorrhoids   . Lactose intolerance   . Lupus ([place])   . LVH (left ventricular hypertrophy) 06/25/2015   Mild, noted on ECHO   . Mild sleep apnea    1/17 CPAP Dr Claiborne Billings does not use cpap   . Morbid obesity ([place]) 06/11/2015  . Osteoporosis   . Paget's disease   . Paget's disease of bone  Dr Trudie Reed, Reclast insufion 02/14/10 - bad reaction to infusion, now tolerates fosamax weekly  . Plantar fasciitis, bilateral   . PMB (postmenopausal bleeding)   . Pre-diabetes   . Sleep apnea   . Thrombocytopenia ([place])   . TR (tricuspid regurgitation) 06/25/2015   Trace, noted on ECHO   . Tubular adenoma polyp of rectum    8/12, Dr Fuller Plan, 5 yr follow up   . Uterine fibroid 10/19/2017   noted on CT pelvis  . Vitamin D deficiency     Past surgical history: Past Surgical History:  Procedure Laterality Date  . AUGMENTATION MAMMAPLASTY Bilateral   . BREAST ENHANCEMENT SURGERY    . BREAST REDUCTION SURGERY    . COLONOSCOPY  01/19/2016  . DILATION AND CURETTAGE OF UTERUS N/A 06/24/2015   Procedure: DILATATION AND CURETTAGE;  Surgeon: Emily Filbert, MD;  Location: [facility];  Service:  Gynecology;  Laterality: N/A;  . KNEE ARTHROSCOPY WITH SUBCHONDROPLASTY Right 05/03/2018   Procedure: RIGHT KNEE ARTHROSCOPY WITH DEBRIDEMENT, PARTIAL MEDIAL MENISCECTOMY SUBCHONDROPLASTY MEDIAL TIBIAL PLATEAU AND MEDIAL FEMORAL CONDYLE;  Surgeon: Mcarthur Rossetti, MD;  Location: [facility];  Service: Orthopedics;  Laterality: Right;  . REDUCTION MAMMAPLASTY Bilateral   . TOE SURGERY     removal of bone in each foot  . TUBAL LIGATION      Family history:  Family History  Problem Relation Age of Onset  . Hypertension Mother   . Heart disease Mother   . Allergic rhinitis Mother   . Hypertension Father   . Hypertension Sister   . Cancer Maternal Grandmother   . Stomach cancer Paternal Grandmother   . Hypertension Son   . Hypertension Son   . Colon cancer Neg Hx     Social history:   Lives in a home with carpeting with electric heating, central cooling. No pets in the home.  She works in [employer].  She reports a quit date of 2010 (smoked black n milds 5/day).   Medication List: Current Outpatient Medications  Medication Sig Dispense Refill  . albuterol (VENTOLIN HFA) 108 (90 Base) MCG/ACT inhaler Inhale 2 puffs into the lungs every 6 (six) hours as needed for wheezing or shortness of breath. 1 Inhaler 0  . amLODipine (NORVASC) 10 MG tablet Take 1 tablet (10 mg total) by mouth daily. 90 tablet 1  . betamethasone dipropionate 0.05 % cream Apply topically 2 (two) times daily. 45 g 5  . fluticasone (FLONASE) 50 MCG/ACT nasal spray Place 2 sprays into both nostrils daily. 16 g 6  . ibuprofen (ADVIL,MOTRIN) 800 MG tablet Take 1 tablet (800 mg total) by mouth every 8 (eight) hours as needed. 90 tablet 0  . meclizine (ANTIVERT) 25 MG tablet Take 1 tablet (25 mg total) by mouth 3 (three) times daily as needed for dizziness. 90 tablet 0  . MELATONIN PO Take by mouth.    . metFORMIN (GLUCOPHAGE) 500 MG tablet Take 1 tablet (500 mg total) by mouth 2 (two) times daily with a meal. 60  tablet 0  . omeprazole (PRILOSEC) 40 MG capsule omeprazole 40 mg capsule,delayed release    . ondansetron (ZOFRAN) 4 MG tablet Take 1 tablet (4 mg total) by mouth every 8 (eight) hours as needed for nausea or vomiting. 20 tablet 0  . telmisartan (MICARDIS) 80 MG tablet Take 1 tablet (80 mg total) by mouth daily. 90 tablet 3  . traZODone (DESYREL) 50 MG tablet Take 1-2 tablets (50-100 mg total) by mouth at bedtime. [place]  tablet 3  . triamcinolone cream (KENALOG) 0.1 % Apply 1 application topically 2 (two) times daily. 30 g 0  . valACYclovir (VALTREX) 500 MG tablet Take 1 tablet (500 mg total) by mouth daily. 90 tablet 3  . Vitamin D, Ergocalciferol, (DRISDOL) 1.25 MG (50000 UT) CAPS capsule Take 1 capsule (50,000 Units total) by mouth every 7 (seven) days. 4 capsule 0  . Estradiol 10 MCG INST Place 10 mcg vaginally 2 (two) times a week. (Patient not taking: Reported on 05/08/2019) 12 each 1  . famotidine (PEPCID) 20 MG tablet Take 1 tablet (20 mg total) by mouth 2 (two) times daily. 60 tablet 5  . pimecrolimus (ELIDEL) 1 % cream Apply topically 2 (two) times daily. 30 g 5  . temazepam (RESTORIL) 30 MG capsule      No current facility-administered medications for this visit.     Known medication allergies: Allergies  Allergen Reactions  . Penicillins Rash and Other (See Comments)    Has patient had a PCN reaction causing immediate rash, facial/tongue/throat swelling, SOB or lightheadedness with hypotension: Yes Has patient had a PCN reaction causing severe rash involving mucus membranes or skin necrosis: No Has patient had a PCN reaction that required hospitalization: No Has patient had a PCN reaction occurring within the last 10 years: No If all of the above answers are "NO", then may proceed with Cephalosporin use.   [name] Reclast [Zoledronic Acid] Nausea And Vomiting     Physical examination: Blood pressure 130/82, pulse 81, temperature 97.6 F (36.4 C), temperature source Temporal, resp.  rate 20, height 5\' 6"  (1.676 m), weight 276 lb 12.8 oz (125.6 kg), last menstrual period 08/01/2012, SpO2 98 %.  General: Alert, interactive, in no acute distress. HEENT: PERRLA, TMs pearly gray, turbinates non-edematous without discharge, post-pharynx non erythematous. Neck: Supple without lymphadenopathy. Lungs: Clear to auscultation without wheezing, rhonchi or rales. {no increased work of breathing. CV: Normal S1, S2 without murmurs. Abdomen: Nondistended, nontender. Skin: erythematous macules and papules in a patch over right lower leg. Extremities:  No clubbing, cyanosis or edema. Neuro:   Grossly intact.  Diagnositics/Labs:   Spirometry: FEV1: 2.38L 100%, FVC: 2.6L 86%, ratio consistent with nonobstructive pattern  Allergy testing: environmental allergy skin prick testing is positive to bahia, rough pigweed, cockroach.  Intradermal testing is positive to ragweed mix, tree mix.   Allergy testing results were read and interpreted by provider, documented by clinical staff.   Assessment and plan:   Urticaria, acute  -  at this time etiology of hives and swelling is unknown.  Hives can be caused by a variety of different triggers including illness/infection, foods, medications, stings, exercise, pressure, vibrations, extremes of temperature to name a few however majority of the time there is no identifiable trigger.  If hives return and are lasting longer than 6 weeks in duration then this becomes chronic and would recommend obtaining labwork at that time  - for management of itch and if hives return recommend taking Zyrtec 10mg  daily along with Pepcid 20mg  daily.   If symptoms persist then increase can increase to Zyrtec and Pepcid twice a day until symptoms resolve  - reserve benadryl for breakthrough symptoms  - environmental allergy testing is positive to weed pollen, tree pollen, grass pollen, cockroach.  Allergen avoidance measures provided.   Dermatitis and pruritus  - we did  discuss option of patch testing for evaluation of contact dermatitis.  Patch testing is best placed on a Monday with readings on Wednesday and  Friday.  If wanting to have patch testing done then you can schedule at your convenience.    - will refill your betamethasone ointment to use as needed for rash  - will also prescribe a nonsteroidal ointment, Elidel apply thin layer twice a day as needed for rash  - use antihistamines as above to help with itch control  Shortness of breath  - reports due to smoking history she sometimes has SOB.  Has been provided with albuterol inhaler.  have access to albuterol inhaler 2 puffs every 4-6 hours as needed for cough/wheeze/shortness of breath/chest tightness.  Monitor frequency of use.    - spirometry is normal  Follow-up 3-4 months or sooner if needed  I appreciate the opportunity to take part in Asyria's care. Please do not hesitate to contact me with questions.  Sincerely,   Prudy Feeler, MD Allergy/Immunology [facility]

## 2019-05-08 NOTE — Patient Instructions (Signed)
Hives  -  at this time etiology of hives and swelling is unknown.  Hives can be caused by a variety of different triggers including illness/infection, foods, medications, stings, exercise, pressure, vibrations, extremes of temperature to name a few however majority of the time there is no identifiable trigger.  If hives return and are lasting longer than 6 weeks in duration then this becomes chronic and would recommend obtaining labwork at that time  - for management of itch and if hives return recommend taking Zyrtec 10mg  daily along with Pepcid 20mg  daily.   If symptoms persist then increase can increase to Zyrtec and Pepcid twice a day until symptoms resolve  - reserve benadryl for breakthrough symptoms  - environmental allergy testing is positive to weed pollen, tree pollen, grass pollen, cockroach.  Allergen avoidance measures provided.   Rash (on leg) and itching  - we did discuss option of patch testing for evaluation of contact dermatitis.  Patch testing is best placed on a Monday with readings on Wednesday and Friday.  If wanting to have patch testing done then you can schedule at your convenience.    - will refill your betamethasone ointment to use as needed for rash  - will also prescribe a nonsteroidal ointment, Elidel apply thin layer twice a day as needed for rash  - use antihistamines as above to help with itch control  Follow-up 3-4 months or sooner if needed

## 2019-05-10 DIAGNOSIS — L930 Discoid lupus erythematosus: Secondary | ICD-10-CM | POA: Diagnosis not present

## 2019-05-10 DIAGNOSIS — R21 Rash and other nonspecific skin eruption: Secondary | ICD-10-CM | POA: Diagnosis not present

## 2019-05-13 ENCOUNTER — Other Ambulatory Visit: Payer: Self-pay

## 2019-05-13 ENCOUNTER — Ambulatory Visit (INDEPENDENT_AMBULATORY_CARE_PROVIDER_SITE_OTHER): Payer: 59 | Admitting: Bariatrics

## 2019-05-13 VITALS — BP 121/79 | HR 86 | Temp 98.0°F | Ht 66.0 in | Wt 275.0 lb

## 2019-05-13 DIAGNOSIS — E559 Vitamin D deficiency, unspecified: Secondary | ICD-10-CM

## 2019-05-13 DIAGNOSIS — Z6841 Body Mass Index (BMI) 40.0 and over, adult: Secondary | ICD-10-CM | POA: Diagnosis not present

## 2019-05-13 DIAGNOSIS — M25561 Pain in right knee: Secondary | ICD-10-CM | POA: Diagnosis not present

## 2019-05-13 DIAGNOSIS — K5909 Other constipation: Secondary | ICD-10-CM

## 2019-05-13 DIAGNOSIS — M25562 Pain in left knee: Secondary | ICD-10-CM

## 2019-05-13 DIAGNOSIS — G8929 Other chronic pain: Secondary | ICD-10-CM | POA: Diagnosis not present

## 2019-05-13 DIAGNOSIS — Z9189 Other specified personal risk factors, not elsewhere classified: Secondary | ICD-10-CM | POA: Diagnosis not present

## 2019-05-13 MED FILL — TRIAMCINOLONE 0.1% CREAM: 0.1 | 30 days supply | Qty: 454 | Fill #0

## 2019-05-14 ENCOUNTER — Encounter (INDEPENDENT_AMBULATORY_CARE_PROVIDER_SITE_OTHER): Payer: Self-pay | Admitting: Bariatrics

## 2019-05-14 NOTE — Progress Notes (Signed)
Office: [phone]  /  Fax: [phone]   HPI:   Chief Complaint: OBESITY Michelle Huerta is here to discuss her progress with her obesity treatment plan. She is on the Category 3 plan and is following her eating plan approximately 30 % of the time. She states she is exercising 0 minutes 0 times per week. Michelle Huerta has gained 1 lb and she states that she has been eating more fast food. She had done well overall.  Her weight is 275 lb (124.7 kg) today and has gained 1 lb since her last visit. She has lost 21 lbs since starting treatment with [place].  Vitamin D Deficiency Michelle Huerta has a diagnosis of vitamin D deficiency. She is currently taking prescription Vit D. Last Vit D level was 27.9. She denies nausea, vomiting or muscle weakness.  At risk for osteopenia and osteoporosis Michelle Huerta is at higher risk of osteopenia and osteoporosis due to vitamin D deficiency.   Bilateral Knee Pain (Both) Michelle Huerta has pain in both knees. She is stable and is taking ibuprofen as needed.  Constipation Michelle Huerta notes constipation for the last few weeks, worse since attempting weight loss. She states BM are less frequent and are not hard and painful. She denies hematochezia or melena. She admits to drinking less H20 recently.  ASSESSMENT AND PLAN:  Vitamin D deficiency  Chronic pain of both knees  Other constipation  At risk for osteoporosis  Class 3 severe obesity with serious comorbidity and body mass index (BMI) of 40.0 to 44.9 in adult, unspecified obesity type ([place])  PLAN:  Vitamin D Deficiency Michelle Huerta was informed that low vitamin D levels contributes to fatigue and are associated with obesity, breast, and colon cancer. Michelle Huerta agrees to continue taking prescription Vit D 50,000 IU every week and will follow up for routine testing of vitamin D, at least 2-3 times per year. She was informed of the risk of over-replacement of vitamin D and agrees to not increase her dose unless she discusses this with [place] first. We will recheck  labs in the future. Michelle Huerta agrees to follow up with our clinic in 2 to 3 weeks.  At risk for osteopenia and osteoporosis Michelle Huerta was given extended  (15 minutes) osteoporosis prevention counseling today. Michelle Huerta is at risk for osteopenia and osteoporsis due to her vitamin D deficiency. She was encouraged to take her vitamin D and follow her higher calcium diet and increase strengthening exercise to help strengthen her bones and decrease her risk of osteopenia and osteoporosis.  Bilateral Knee Pain (Both) Michelle Huerta is to gradually increase exercise, and no pounding exercises. Michelle Huerta agrees to follow up with our clinic in 2 to 3 weeks.  Constipation Michelle Huerta was informed decrease bowel movement frequency is normal while losing weight, but stools should not be hard or painful. She was advised to increase her H20 intake and work on increasing her fiber intake. High fiber foods were discussed today. Michelle Huerta may take Ducolax chewables occasionally and she is to buy stool softener. Michelle Huerta agrees to follow up with our clinic in 2 to 3 weeks.  Obesity Michelle Huerta is currently in the action stage of change. As such, her goal is to continue with weight loss efforts She has agreed to follow the Category 3 plan Michelle Huerta has been instructed to work up to a goal of 150 minutes of combined cardio and strengthening exercise per week for weight loss and overall health benefits. We discussed the following Behavioral Modification Strategies today: increasing lean protein intake, decreasing simple carbohydrates, increasing  vegetables, decrease eating out, increase H20 intake, no skipping meals, work on meal planning and easy cooking plans, and keeping healthy foods in the home Michelle Huerta is to have greater adherence to the meal plan (less eating out). She is to increase her water intake to 64+ oz daily.  Michelle Huerta has agreed to follow up with our clinic in 2 to 3 weeks. She was informed of the importance of frequent follow up visits to maximize her success with  intensive lifestyle modifications for her multiple health conditions.  ALLERGIES: Allergies  Allergen Reactions  . Penicillins Rash and Other (See Comments)    Has patient had a PCN reaction causing immediate rash, facial/tongue/throat swelling, SOB or lightheadedness with hypotension: Yes Has patient had a PCN reaction causing severe rash involving mucus membranes or skin necrosis: No Has patient had a PCN reaction that required hospitalization: No Has patient had a PCN reaction occurring within the last 10 years: No If all of the above answers are "NO", then may proceed with Cephalosporin use.   [name] Reclast [Zoledronic Acid] Nausea And Vomiting    MEDICATIONS: Current Outpatient Medications on File Prior to Visit  Medication Sig Dispense Refill  . albuterol (VENTOLIN HFA) 108 (90 Base) MCG/ACT inhaler Inhale 2 puffs into the lungs every 6 (six) hours as needed for wheezing or shortness of breath. 1 Inhaler 0  . amLODipine (NORVASC) 10 MG tablet Take 1 tablet (10 mg total) by mouth daily. 90 tablet 1  . betamethasone dipropionate 0.05 % cream Apply topically 2 (two) times daily. 45 g 5  . Estradiol 10 MCG INST Place 10 mcg vaginally 2 (two) times a week. 12 each 1  . famotidine (PEPCID) 20 MG tablet Take 1 tablet (20 mg total) by mouth 2 (two) times daily. 60 tablet 5  . fluticasone (FLONASE) 50 MCG/ACT nasal spray Place 2 sprays into both nostrils daily. 16 g 6  . ibuprofen (ADVIL,MOTRIN) 800 MG tablet Take 1 tablet (800 mg total) by mouth every 8 (eight) hours as needed. 90 tablet 0  . meclizine (ANTIVERT) 25 MG tablet Take 1 tablet (25 mg total) by mouth 3 (three) times daily as needed for dizziness. 90 tablet 0  . MELATONIN PO Take by mouth.    . metFORMIN (GLUCOPHAGE) 500 MG tablet Take 1 tablet (500 mg total) by mouth 2 (two) times daily with a meal. 60 tablet 0  . omeprazole (PRILOSEC) 40 MG capsule omeprazole 40 mg capsule,delayed release    . ondansetron (ZOFRAN) 4 MG tablet Take  1 tablet (4 mg total) by mouth every 8 (eight) hours as needed for nausea or vomiting. 20 tablet 0  . pimecrolimus (ELIDEL) 1 % cream Apply topically 2 (two) times daily. 30 g 5  . telmisartan (MICARDIS) 80 MG tablet Take 1 tablet (80 mg total) by mouth daily. 90 tablet 3  . temazepam (RESTORIL) 30 MG capsule     . traZODone (DESYREL) 50 MG tablet Take 1-2 tablets (50-100 mg total) by mouth at bedtime. 180 tablet 3  . triamcinolone cream (KENALOG) 0.1 % Apply 1 application topically 2 (two) times daily. 30 g 0  . valACYclovir (VALTREX) 500 MG tablet Take 1 tablet (500 mg total) by mouth daily. 90 tablet 3  . Vitamin D, Ergocalciferol, (DRISDOL) 1.25 MG (50000 UT) CAPS capsule Take 1 capsule (50,000 Units total) by mouth every 7 (seven) days. 4 capsule 0   No current facility-administered medications on file prior to visit.     PAST MEDICAL HISTORY:  Past Medical History:  Diagnosis Date  . Allergy   . Arthritis of both knees   . Asthma   . Bilateral chronic knee pain   . Chronic back pain   . Constipation   . DDD (degenerative disc disease), cervical   . Depression    no meds  . Genital herpes    HSV type 2 at rectum, culture positive 3/15  . Glaucoma   . Grade I diastolic dysfunction [id number]   noted on ECHO   . Heart murmur   . History of colon polyps   . HLD (hyperlipidemia)   . Hypertension   . Hypothyroidism   . Insomnia   . Internal hemorrhoids   . Lactose intolerance   . Lupus ([place])   . LVH (left ventricular hypertrophy) 06/25/2015   Mild, noted on ECHO   . Mild sleep apnea    1/17 CPAP Dr Claiborne Billings does not use cpap   . Morbid obesity ([place]) 06/11/2015  . Osteoporosis   . Paget's disease   . Paget's disease of bone    Dr Trudie Reed, Reclast insufion 02/14/10 - bad reaction to infusion, now tolerates fosamax weekly  . Plantar fasciitis, bilateral   . PMB (postmenopausal bleeding)   . Pre-diabetes   . Sleep apnea   . Thrombocytopenia ([place])   . TR (tricuspid  regurgitation) 06/25/2015   Trace, noted on ECHO   . Tubular adenoma polyp of rectum    8/12, Dr Fuller Plan, 5 yr follow up   . Uterine fibroid 10/19/2017   noted on CT pelvis  . Vitamin D deficiency     PAST SURGICAL HISTORY: Past Surgical History:  Procedure Laterality Date  . AUGMENTATION MAMMAPLASTY Bilateral   . BREAST ENHANCEMENT SURGERY    . BREAST REDUCTION SURGERY    . COLONOSCOPY  01/19/2016  . DILATION AND CURETTAGE OF UTERUS N/A 06/24/2015   Procedure: DILATATION AND CURETTAGE;  Surgeon: Emily Filbert, MD;  Location: [facility];  Service: Gynecology;  Laterality: N/A;  . KNEE ARTHROSCOPY WITH SUBCHONDROPLASTY Right 05/03/2018   Procedure: RIGHT KNEE ARTHROSCOPY WITH DEBRIDEMENT, PARTIAL MEDIAL MENISCECTOMY SUBCHONDROPLASTY MEDIAL TIBIAL PLATEAU AND MEDIAL FEMORAL CONDYLE;  Surgeon: Mcarthur Rossetti, MD;  Location: [facility];  Service: Orthopedics;  Laterality: Right;  . REDUCTION MAMMAPLASTY Bilateral   . TOE SURGERY     removal of bone in each foot  . TUBAL LIGATION      SOCIAL HISTORY: Social History   Tobacco Use  . Smoking status: Former Smoker    Years: 7.00    Types: Cigars    Quit date: 08/11/2008    Years since quitting: 10.7  . Smokeless tobacco: Never Used  . Tobacco comment: smoked [employer], 1 pack per day (5 in a pack)  Substance Use Topics  . Alcohol use: Yes    Alcohol/week: 5.0 standard drinks    Types: 5 Cans of beer per week    Comment: occ   . Drug use: Never    FAMILY HISTORY: Family History  Problem Relation Age of Onset  . Hypertension Mother   . Heart disease Mother   . Allergic rhinitis Mother   . Hypertension Father   . Hypertension Sister   . Cancer Maternal Grandmother   . Stomach cancer Paternal Grandmother   . Hypertension Son   . Hypertension Son   . Colon cancer Neg Hx     ROS: Review of Systems  Constitutional: Negative for weight loss.  Gastrointestinal: Positive for constipation. Negative for melena,  nausea and  vomiting.       Negative hematochezia  Musculoskeletal:       Negative muscle weakness + Knee pain (both)    PHYSICAL EXAM: Blood pressure 121/79, pulse 86, temperature 98 F (36.7 C), height 5\' 6"  (1.676 m), weight 275 lb (124.7 kg), last menstrual period 08/01/2012, SpO2 100 %. Body mass index is 44.39 kg/m. Physical Exam Vitals signs reviewed.  Constitutional:      Appearance: Normal appearance. She is obese.  Cardiovascular:     Rate and Rhythm: Normal rate.     Pulses: Normal pulses.  Pulmonary:     Effort: Pulmonary effort is normal.     Breath sounds: Normal breath sounds.  Musculoskeletal: Normal range of motion.  Skin:    General: Skin is warm and dry.  Neurological:     Mental Status: She is alert and oriented to person, place, and time.  Psychiatric:        Mood and Affect: Mood normal.        Behavior: Behavior normal.     RECENT LABS AND TESTS: BMET    Component Value Date/Time   NA 136 12/07/2018 1153   K 4.2 12/07/2018 1153   CL 101 12/07/2018 1153   CO2 20 12/07/2018 1153   GLUCOSE 89 12/07/2018 1153   GLUCOSE 96 12/02/2018 1500   BUN 15 12/07/2018 1153   CREATININE 0.92 12/07/2018 1153   CALCIUM 8.8 12/07/2018 1153   GFRNONAA 70 12/07/2018 1153   GFRAA 80 12/07/2018 1153   Lab Results  Component Value Date   HGBA1C 5.5 02/07/2019   HGBA1C 5.9 (A) 09/10/2018   HGBA1C 6.3 (H) 05/30/2018   HGBA1C 6.0 (A) 03/09/2018   Lab Results  Component Value Date   INSULIN 37.3 (H) 02/07/2019   INSULIN 41.4 (H) 05/30/2018   CBC    Component Value Date/Time   WBC 5.8 12/07/2018 1153   WBC 5.1 12/02/2018 1500   RBC 4.68 12/07/2018 1153   RBC 4.70 12/02/2018 1500   HGB 12.7 12/07/2018 1153   HCT 37.5 12/07/2018 1153   PLT 138 (L) 12/07/2018 1153   MCV 80 12/07/2018 1153   MCH 27.1 12/07/2018 1153   MCH 27.2 12/02/2018 1500   MCHC 33.9 12/07/2018 1153   MCHC 31.5 12/02/2018 1500   RDW 13.5 12/07/2018 1153   LYMPHSABS 1.7 12/02/2018 1500    LYMPHSABS 2.4 05/30/2018 1116   MONOABS 0.3 12/02/2018 1500   EOSABS 0.0 12/02/2018 1500   EOSABS 0.1 05/30/2018 1116   BASOSABS 0.0 12/02/2018 1500   BASOSABS 0.0 05/30/2018 1116   Iron/TIBC/Ferritin/ %Sat No results found for: IRON, TIBC, FERRITIN, IRONPCTSAT Lipid Panel     Component Value Date/Time   CHOL 134 05/30/2018 1116   TRIG 85 05/30/2018 1116   HDL 75 05/30/2018 1116   CHOLHDL 1.8 05/30/2018 1116   LDLCALC 42 05/30/2018 1116   Hepatic Function Panel     Component Value Date/Time   PROT 7.5 12/07/2018 1153   ALBUMIN 3.9 12/07/2018 1153   AST 50 (H) 12/07/2018 1153   ALT 56 (H) 12/07/2018 1153   ALKPHOS 65 12/07/2018 1153   BILITOT 0.6 12/07/2018 1153      Component Value Date/Time   TSH 1.410 12/07/2018 1153   TSH 1.280 05/30/2018 1116   TSH 1.220 04/26/2018 1647      OBESITY BEHAVIORAL INTERVENTION VISIT  Today's visit was # 17   Starting weight: 296 lbs Starting date: 05/30/18 Today's weight : 275 lbs Today's  date: 05/13/2019 Total lbs lost to date: 54    ASK: We discussed the diagnosis of obesity with [name] today and Rosamaria agreed to give [place] permission to discuss obesity behavioral modification therapy today.  ASSESS: Drayah has the diagnosis of obesity and her BMI today is 44.41 Trenisha is in the action stage of change   ADVISE: Gretchan was educated on the multiple health risks of obesity as well as the benefit of weight loss to improve her health. She was advised of the need for long term treatment and the importance of lifestyle modifications to improve her current health and to decrease her risk of future health problems.  AGREE: Multiple dietary modification options and treatment options were discussed and  Michelle Huerta agreed to follow the recommendations documented in the above note.  ARRANGE: Pura was educated on the importance of frequent visits to treat obesity as outlined per CMS and USPSTF guidelines and agreed to schedule her next follow  up appointment today.  Michelle Huerta, am acting as transcriptionist for [employer], DO  I have reviewed the above documentation for accuracy and completeness, and I agree with the above. -Jearld Lesch, DO

## 2019-05-21 NOTE — Telephone Encounter (Signed)
Spoke to patient. She has been out of work for almost a year due to her chronic knee issues She is followed by Dr. Ninfa Linden of [facility] Please fax the [place] form to Dr. Ninfa Linden.  I was not managing this condition and she established with me only recently.

## 2019-05-21 NOTE — Telephone Encounter (Signed)
I faxed his form from [facility] to Dr. Ninfa Linden of [place] and got a confirmation.

## 2019-05-22 DIAGNOSIS — G4733 Obstructive sleep apnea (adult) (pediatric): Secondary | ICD-10-CM | POA: Diagnosis not present

## 2019-05-27 ENCOUNTER — Other Ambulatory Visit: Payer: Self-pay | Admitting: Physician Assistant

## 2019-05-27 DIAGNOSIS — I10 Essential (primary) hypertension: Secondary | ICD-10-CM

## 2019-05-27 DIAGNOSIS — A6000 Herpesviral infection of urogenital system, unspecified: Secondary | ICD-10-CM

## 2019-05-27 MED FILL — traZODone HCL 50 MG TABS: 50 | 90 days supply | Qty: 180 | Fill #1

## 2019-05-27 MED FILL — FLUTICASONE PROP 50 MCG SPR: 50 | 30 days supply | Qty: 16 | Fill #1

## 2019-05-27 NOTE — Telephone Encounter (Signed)
Prescriptions for valtrex 500 mg and TELMISARTAN 80 mg both expired on 04/26/2019.

## 2019-05-29 MED FILL — TELMISARTAN 80 MG TABLET: 80 | 90 days supply | Qty: 90 | Fill #0

## 2019-05-29 MED FILL — VALACYCLOVIR HCL 500 MG TAB: 500 | 90 days supply | Qty: 90 | Fill #0

## 2019-06-03 ENCOUNTER — Ambulatory Visit (INDEPENDENT_AMBULATORY_CARE_PROVIDER_SITE_OTHER): Payer: 59 | Admitting: Family Medicine

## 2019-06-05 ENCOUNTER — Other Ambulatory Visit: Payer: Self-pay

## 2019-06-05 ENCOUNTER — Ambulatory Visit: Payer: 59 | Admitting: Family Medicine

## 2019-06-05 ENCOUNTER — Encounter: Payer: Self-pay | Admitting: Family Medicine

## 2019-06-05 VITALS — BP 124/80 | HR 89 | Temp 98.6°F | Resp 17 | Ht 66.0 in | Wt 278.4 lb

## 2019-06-05 DIAGNOSIS — R7303 Prediabetes: Secondary | ICD-10-CM

## 2019-06-05 DIAGNOSIS — G4733 Obstructive sleep apnea (adult) (pediatric): Secondary | ICD-10-CM

## 2019-06-05 DIAGNOSIS — I10 Essential (primary) hypertension: Secondary | ICD-10-CM | POA: Diagnosis not present

## 2019-06-05 NOTE — Patient Instructions (Addendum)
Vitamin D 2000 units over the counter for vitamin D deficiency   If you have lab work done today you will be contacted with your lab results within the next 2 weeks.  If you have not heard from [place] then please contact us. The fastest way to get your results is to register for My Chart.   IF you received an x-ray today, you will receive an invoice from [facility]. Please contact [facility] at [phone] with questions or concerns regarding your invoice.   IF you received labwork today, you will receive an invoice from [facility]. Please contact [facility] at [phone] with questions or concerns regarding your invoice.   Our billing staff will not be able to assist you with questions regarding bills from these companies.  You will be contacted with the lab results as soon as they are available. The fastest way to get your results is to activate your My Chart account. Instructions are located on the last page of this paperwork. If you have not heard from [place] regarding the results in 2 weeks, please contact this office.    Ergocalciferol, Vitamin D2 tablets or capsules What is this medicine? ERGOCALCIFEROL (er goe kal SIF e role) is a man made form of vitamin D. It helps your body keep the right amount of calcium and phosphorus for healthy bones and teeth. This medicine may be used for other purposes; ask your health care provider or pharmacist if you have questions. COMMON BRAND NAME(S): DECARA, Deltalin, Drisdol, Ergo D What should I tell my health care provider before I take this medicine? They need to know if you have any of the following conditions:  kidney disease  liver disease  other chronic disease  parathyroid disease  stomach disease  an unusual or allergic reaction to vitamin D, other medicines, foods, dyes, or preservatives  pregnant or trying to get pregnant  breast-feeding How should I use this medicine? Take this medicine by mouth with a glass  of water. Follow the directions on the prescription label. Take your medicine at regular intervals. Do not take your medicine more often than directed. Talk to your pediatrician regarding the use of this medicine in children. While this drug may be prescribed for children for selected conditions, precautions do apply. Overdosage: If you think you have taken too much of this medicine contact a poison control center or emergency room at once. NOTE: This medicine is only for you. Do not share this medicine with others. What if I miss a dose? If you miss a dose, take it as soon as you can. If it is almost time for your next dose, take only that dose. Do not take double or extra doses. What may interact with this medicine? Do not take this medicine with any of the following medications:  vitamin D This medicine may also interact with the following medications:  digoxin  diuretics  medicines for cholesterol like colestipol or cholestyramine  medicines to treat seizures or nerve pain  mineral oil  orlistat  some over-the-counter supplements This list may not describe all possible interactions. Give your health care provider a list of all the medicines, herbs, non-prescription drugs, or dietary supplements you use. Also tell them if you smoke, drink alcohol, or use illegal drugs. Some items may interact with your medicine. What should I watch for while using this medicine? Visit your doctor or health care professional for regular checks on your progress. Do not take any non-prescription medicines that have vitamin D,  phosphorus, magnesium, or calcium including antacids while taking this medicine, unless your doctor or health care professional says you can. The extra supplements can cause side effects. What side effects may I notice from receiving this medicine? Side effects that you should report to your doctor or health care professional as soon as possible:  allergic reactions like skin  rash, itching or hives, swelling of the face, lips, or tongue  bone pain  increased thirst  increased urination (especially at night)  irregular heartbeat, high blood pressure  seizures  unexpected weight loss  unusually weak or tired Side effects that usually do not require medical attention (report to your doctor or health care professional if they continue or are bothersome):  constipation  dry mouth  headache  loss of appetite  metallic taste  stomach upset This list may not describe all possible side effects. Call your doctor for medical advice about side effects. You may report side effects to FDA at 1-800-FDA-1088. Where should I keep my medicine? Keep out of the reach of children. Store at room temperature between 15 and 30 degrees C (59 and 86 degrees F). Protect from light. Keep container tightly closed. Throw away any unused medicine after the expiration date. NOTE: This sheet is a summary. It may not cover all possible information. If you have questions about this medicine, talk to your doctor, pharmacist, or health care provider.  2020 Elsevier/Gold Standard (2007-10-29 17:48:44)

## 2019-06-05 NOTE — Progress Notes (Signed)
Established Patient Office Visit  Subjective:  Patient ID: Michelle Huerta, female    DOB: [birth year]  Age: 58 y.o. MRN: [id number]  CC:  Chief Complaint  Patient presents with   med check    no refills needed    HPI Michelle Huerta presents for   Hypertension: Patient here for follow-up of elevated blood pressure. She is not exercising and is adherent to low salt diet.  Blood pressure is well controlled at home. Cardiac symptoms none. Patient denies chest pain, claudication, exertional chest pressure/discomfort and fatigue.  Cardiovascular risk factors: hypertension, obesity (BMI >= 30 kg/m2) and sedentary lifestyle. Use of agents associated with hypertension: none. History of target organ damage: none. BP Readings from Last 3 Encounters:  06/05/19 124/80  05/13/19 121/79  05/08/19 130/82   OSA on CPAP Pt had to get her cpap fixed She was not sleeping well She realized it was because the cpap was broken trazadone and zyrtec made her dizzy  So she stopped both  Prediabetes She is on metformin No issues with the metformin Lab Results  Component Value Date   HGBA1C 5.5 02/07/2019     Past Medical History:  Diagnosis Date   Allergy    Arthritis of both knees    Asthma    Bilateral chronic knee pain    Chronic back pain    Constipation    DDD (degenerative disc disease), cervical    Depression    no meds   Genital herpes    HSV type 2 at rectum, culture positive 3/15   Glaucoma    Grade I diastolic dysfunction [id number]   noted on ECHO    Heart murmur    History of colon polyps    HLD (hyperlipidemia)    Hypertension    Hypothyroidism    Insomnia    Internal hemorrhoids    Lactose intolerance    Lupus (HCC)    LVH (left ventricular hypertrophy) 06/25/2015   Mild, noted on ECHO    Mild sleep apnea    1/17 CPAP Dr Claiborne Billings does not use cpap    Morbid obesity ([place]) 06/11/2015   Osteoporosis    Paget's disease    Paget's disease of bone      Dr Trudie Reed, Reclast insufion 02/14/10 - bad reaction to infusion, now tolerates fosamax weekly   Plantar fasciitis, bilateral    PMB (postmenopausal bleeding)    Pre-diabetes    Sleep apnea    Thrombocytopenia (HCC)    TR (tricuspid regurgitation) 06/25/2015   Trace, noted on ECHO    Tubular adenoma polyp of rectum    8/12, Dr Fuller Plan, 5 yr follow up    Uterine fibroid 10/19/2017   noted on CT pelvis   Vitamin D deficiency     Past Surgical History:  Procedure Laterality Date   AUGMENTATION MAMMAPLASTY Bilateral    BREAST ENHANCEMENT SURGERY     BREAST REDUCTION SURGERY     COLONOSCOPY  01/19/2016   DILATION AND CURETTAGE OF UTERUS N/A 06/24/2015   Procedure: DILATATION AND CURETTAGE;  Surgeon: Emily Filbert, MD;  Location: [facility];  Service: Gynecology;  Laterality: N/A;   KNEE ARTHROSCOPY WITH SUBCHONDROPLASTY Right 05/03/2018   Procedure: RIGHT KNEE ARTHROSCOPY WITH DEBRIDEMENT, PARTIAL MEDIAL MENISCECTOMY SUBCHONDROPLASTY MEDIAL TIBIAL PLATEAU AND MEDIAL FEMORAL CONDYLE;  Surgeon: Mcarthur Rossetti, MD;  Location: [facility];  Service: Orthopedics;  Laterality: Right;   REDUCTION MAMMAPLASTY Bilateral    TOE SURGERY     removal of  bone in each foot   TUBAL LIGATION      Family History  Problem Relation Age of Onset   Hypertension Mother    Heart disease Mother    Allergic rhinitis Mother    Hypertension Father    Hypertension Sister    Cancer Maternal Grandmother    Stomach cancer Paternal Grandmother    Hypertension Son    Hypertension Son    Colon cancer Neg Hx     Social History   Socioeconomic History   Marital status: Single    Spouse name: Not on file   Number of children: Not on file   Years of education: Not on file   Highest education level: Not on file  Occupational History   Occupation: Actuary- [employer]  Social Needs   Financial resource strain: Not on file   Food insecurity    Worry: Not on file    Inability:  Not on file   Transportation needs    Medical: Not on file    Non-medical: Not on file  Tobacco Use   Smoking status: Former Smoker    Years: 7.00    Types: Cigars    Quit date: 08/11/2008    Years since quitting: 10.8   Smokeless tobacco: Never Used   Tobacco comment: smoked Black&Milds, 1 pack per day (5 in a pack)  Substance and Sexual Activity   Alcohol use: Yes    Alcohol/week: 5.0 standard drinks    Types: 5 Cans of beer per week    Comment: occ    Drug use: Never   Sexual activity: Not Currently    Partners: Male    Birth control/protection: Surgical  Lifestyle   Physical activity    Days per week: Not on file    Minutes per session: Not on file   Stress: Not on file  Relationships   Social connections    Talks on phone: Not on file    Gets together: Not on file    Attends religious service: Not on file    Active member of club or organization: Not on file    Attends meetings of clubs or organizations: Not on file    Relationship status: Not on file   Intimate partner violence    Fear of current or ex partner: Not on file    Emotionally abused: Not on file    Physically abused: Not on file    Forced sexual activity: Not on file  Other Topics Concern   Not on file  Social History Narrative   Epworth sleepiness score as of 06/11/15 a 4   Exercise: yes, active at work, no formal exercise   Single, never married, in relationship off and on   Children: [place], [place] (both in [place]), 2 grandkids.  Raised 2 nieces [name], [place] (they have 5 kids)   Occupation: [employer]   Religion: faith important, not active in a church   Seatbelt use: yes    Outpatient Medications Prior to Visit  Medication Sig Dispense Refill   albuterol (VENTOLIN HFA) 108 (90 Base) MCG/ACT inhaler Inhale 2 puffs into the lungs every 6 (six) hours as needed for wheezing or shortness of breath. 1 Inhaler 0   amLODipine (NORVASC) 10 MG tablet Take 1  tablet (10 mg total) by mouth daily. 90 tablet 1   betamethasone dipropionate 0.05 % cream Apply topically 2 (two) times daily. 45 g 5   Estradiol 10 MCG INST Place 10 mcg vaginally 2 (  two) times a week. 12 each 1   famotidine (PEPCID) 20 MG tablet Take 1 tablet (20 mg total) by mouth 2 (two) times daily. 60 tablet 5   fluticasone (FLONASE) 50 MCG/ACT nasal spray Place 2 sprays into both nostrils daily. 16 g 6   ibuprofen (ADVIL,MOTRIN) 800 MG tablet Take 1 tablet (800 mg total) by mouth every 8 (eight) hours as needed. 90 tablet 0   meclizine (ANTIVERT) 25 MG tablet Take 1 tablet (25 mg total) by mouth 3 (three) times daily as needed for dizziness. 90 tablet 0   MELATONIN PO Take by mouth.     metFORMIN (GLUCOPHAGE) 500 MG tablet Take 1 tablet (500 mg total) by mouth 2 (two) times daily with a meal. 60 tablet 0   omeprazole (PRILOSEC) 40 MG capsule omeprazole 40 mg capsule,delayed release     ondansetron (ZOFRAN) 4 MG tablet Take 1 tablet (4 mg total) by mouth every 8 (eight) hours as needed for nausea or vomiting. 20 tablet 0   pimecrolimus (ELIDEL) 1 % cream Apply topically 2 (two) times daily. 30 g 5   telmisartan (MICARDIS) 80 MG tablet TAKE 1 TABLET BY MOUTH DAILY. 90 tablet 0   temazepam (RESTORIL) 30 MG capsule      traZODone (DESYREL) 50 MG tablet Take 1-2 tablets (50-100 mg total) by mouth at bedtime. 180 tablet 3   triamcinolone cream (KENALOG) 0.1 % Apply 1 application topically 2 (two) times daily. 30 g 0   valACYclovir (VALTREX) 500 MG tablet TAKE 1 TABLET BY MOUTH DAILY. 90 tablet 0   Vitamin D, Ergocalciferol, (DRISDOL) 1.25 MG (50000 UT) CAPS capsule Take 1 capsule (50,000 Units total) by mouth every 7 (seven) days. 4 capsule 0   No facility-administered medications prior to visit.     Allergies  Allergen Reactions   Penicillins Rash and Other (See Comments)    Has patient had a PCN reaction causing immediate rash, facial/tongue/throat swelling, SOB or  lightheadedness with hypotension: Yes Has patient had a PCN reaction causing severe rash involving mucus membranes or skin necrosis: No Has patient had a PCN reaction that required hospitalization: No Has patient had a PCN reaction occurring within the last 10 years: No If all of the above answers are "NO", then may proceed with Cephalosporin use.    Reclast [Zoledronic Acid] Nausea And Vomiting    ROS Review of Systems Review of Systems  Constitutional: Negative for activity change, appetite change, chills and fever.  HENT: Negative for congestion, nosebleeds, trouble swallowing and voice change.   Respiratory: Negative for cough, shortness of breath and wheezing.   Gastrointestinal: Negative for diarrhea, nausea and vomiting.  Genitourinary: Negative for difficulty urinating, dysuria, flank pain and hematuria.  Musculoskeletal: Negative for back pain, joint swelling and neck pain.  Neurological: Negative for dizziness, speech difficulty, light-headedness and numbness.  See HPI. All other review of systems negative.     Objective:    Physical Exam  BP 124/80 (BP Location: Right Arm, Patient Position: Sitting, Cuff Size: Large)    Pulse 89    Temp 98.6 F (37 C) (Oral)    Resp 17    Ht 5\' 6"  (1.676 m)    Wt 278 lb 6.4 oz (126.3 kg)    LMP 08/01/2012    SpO2 100%    BMI 44.93 kg/m  Wt Readings from Last 3 Encounters:  06/05/19 278 lb 6.4 oz (126.3 kg)  05/13/19 275 lb (124.7 kg)  05/08/19 276 lb 12.8 oz (125.6  kg)   Physical Exam  Constitutional: Oriented to person, place, and time. Appears well-developed and well-nourished.  HENT:  Head: Normocephalic and atraumatic.  Eyes: Conjunctivae and EOM are normal.  Cardiovascular: Normal rate, regular rhythm, normal heart sounds and intact distal pulses.  No murmur heard. Pulmonary/Chest: Effort normal and breath sounds normal. No stridor. No respiratory distress. Has no wheezes.  Neurological: Is alert and oriented to person,  place, and time.  Skin: Skin is warm. Capillary refill takes less than 2 seconds.  Psychiatric: Has a normal mood and affect. Behavior is normal. Judgment and thought content normal.    There are no preventive care reminders to display for this patient.  There are no preventive care reminders to display for this patient.  Lab Results  Component Value Date   TSH 1.410 12/07/2018   Lab Results  Component Value Date   WBC 5.8 12/07/2018   HGB 12.7 12/07/2018   HCT 37.5 12/07/2018   MCV 80 12/07/2018   PLT 138 (L) 12/07/2018   Lab Results  Component Value Date   NA 136 12/07/2018   K 4.2 12/07/2018   CO2 20 12/07/2018   GLUCOSE 89 12/07/2018   BUN 15 12/07/2018   CREATININE 0.92 12/07/2018   BILITOT 0.6 12/07/2018   ALKPHOS 65 12/07/2018   AST 50 (H) 12/07/2018   ALT 56 (H) 12/07/2018   PROT 7.5 12/07/2018   ALBUMIN 3.9 12/07/2018   CALCIUM 8.8 12/07/2018   ANIONGAP 8 12/02/2018   Lab Results  Component Value Date   CHOL 134 05/30/2018   Lab Results  Component Value Date   HDL 75 05/30/2018   Lab Results  Component Value Date   LDLCALC 42 05/30/2018   Lab Results  Component Value Date   TRIG 85 05/30/2018   Lab Results  Component Value Date   CHOLHDL 1.8 05/30/2018   Lab Results  Component Value Date   HGBA1C 5.5 02/07/2019      Assessment & Plan:   Problem List Items Addressed This Visit      Cardiovascular and Mediastinum   Essential (primary) hypertension   Essential hypertension - Primary     Other   Prediabetes    Osa on cpap She has adjusted her cpap Discussed that the cpap impacts her bp so she should continue cpap  Hypertension bp stable  Prediabetes a1c is in a good range  No orders of the defined types were placed in this encounter.   Follow-up: No follow-ups on file.    Forrest Moron, MD

## 2019-06-06 LAB — COMPREHENSIVE METABOLIC PANEL
ALT: 38 IU/L — ABNORMAL HIGH (ref 0–32)
AST: 30 IU/L (ref 0–40)
Albumin/Globulin Ratio: 1.1 — ABNORMAL LOW (ref 1.2–2.2)
Albumin: 4.2 g/dL (ref 3.8–4.9)
Alkaline Phosphatase: 101 IU/L (ref 39–117)
BUN/Creatinine Ratio: 20 (ref 9–23)
BUN: 16 mg/dL (ref 6–24)
Bilirubin Total: 0.6 mg/dL (ref 0.0–1.2)
CO2: 19 mmol/L — ABNORMAL LOW (ref 20–29)
Calcium: 9.6 mg/dL (ref 8.7–10.2)
Chloride: 103 mmol/L (ref 96–106)
Creatinine, Ser: 0.82 mg/dL (ref 0.57–1.00)
GFR calc Af Amer: 93 mL/min/{1.73_m2} (ref >59)
GFR calc non Af Amer: 80 mL/min/{1.73_m2} (ref >59)
Globulin, Total: 3.8 g/dL (ref 1.5–4.5)
Glucose: 99 mg/dL (ref 65–99)
Potassium: 3.9 mmol/L (ref 3.5–5.2)
Sodium: 142 mmol/L (ref 134–144)
Total Protein: 8 g/dL (ref 6.0–8.5)

## 2019-06-17 ENCOUNTER — Ambulatory Visit: Payer: 59 | Admitting: Physician Assistant

## 2019-06-24 ENCOUNTER — Encounter (INDEPENDENT_AMBULATORY_CARE_PROVIDER_SITE_OTHER): Payer: Self-pay | Admitting: Bariatrics

## 2019-06-24 ENCOUNTER — Ambulatory Visit (INDEPENDENT_AMBULATORY_CARE_PROVIDER_SITE_OTHER): Payer: 59 | Admitting: Bariatrics

## 2019-06-24 ENCOUNTER — Other Ambulatory Visit: Payer: Self-pay

## 2019-06-24 VITALS — BP 122/80 | HR 75 | Temp 98.3°F | Ht 66.0 in | Wt 275.0 lb

## 2019-06-24 DIAGNOSIS — Z6841 Body Mass Index (BMI) 40.0 and over, adult: Secondary | ICD-10-CM | POA: Diagnosis not present

## 2019-06-24 DIAGNOSIS — I10 Essential (primary) hypertension: Secondary | ICD-10-CM | POA: Diagnosis not present

## 2019-06-24 DIAGNOSIS — R7303 Prediabetes: Secondary | ICD-10-CM | POA: Diagnosis not present

## 2019-06-24 DIAGNOSIS — Z9189 Other specified personal risk factors, not elsewhere classified: Secondary | ICD-10-CM | POA: Diagnosis not present

## 2019-06-24 DIAGNOSIS — E559 Vitamin D deficiency, unspecified: Secondary | ICD-10-CM

## 2019-06-24 DIAGNOSIS — M170 Bilateral primary osteoarthritis of knee: Secondary | ICD-10-CM | POA: Diagnosis not present

## 2019-06-24 DIAGNOSIS — M25569 Pain in unspecified knee: Secondary | ICD-10-CM | POA: Diagnosis not present

## 2019-06-24 DIAGNOSIS — M889 Osteitis deformans of unspecified bone: Secondary | ICD-10-CM | POA: Diagnosis not present

## 2019-06-24 DIAGNOSIS — M1990 Unspecified osteoarthritis, unspecified site: Secondary | ICD-10-CM | POA: Diagnosis not present

## 2019-06-24 DIAGNOSIS — L930 Discoid lupus erythematosus: Secondary | ICD-10-CM | POA: Diagnosis not present

## 2019-06-24 MED ORDER — METFORMIN HCL 500 MG PO TABS: 500.0000 mg | ORAL_TABLET | Freq: Two times a day (BID) | ORAL | 0 refills | Status: DC

## 2019-06-24 MED ORDER — VITAMIN D (ERGOCALCIFEROL) 1.25 MG (50000 UNIT) PO CAPS: 50000.0000 [IU] | ORAL_CAPSULE | ORAL | 0 refills | Status: DC

## 2019-06-24 MED FILL — VIT D2 1.25 MG (50,000 UNIT: 1.25 MG | 28 days supply | Qty: 4 | Fill #0

## 2019-06-24 MED FILL — metFORMIN HCL 500 MG TABS: 500 | 30 days supply | Qty: 60 | Fill #0

## 2019-06-24 NOTE — Progress Notes (Signed)
Office: [phone]  /  Fax: [phone]   HPI:  Chief Complaint: OBESITY Michelle Huerta is here to discuss her progress with her obesity treatment plan. She is on the Category 3 plan and states she is following her eating plan approximately 20% of the time. She states she is exercising 0 minutes 0 times per week.  Michelle Huerta has gained 3 lbs over the holiday. She "fell off the plan" over the last 2 weeks.  Today's visit was #18 Starting weight: 296 lbs Starting date: 05/30/2018 Today's weight: 275 lbs  Today's date: 06/24/2019 Total lbs lost to date: 21  Total lbs lost since last in-office visit: 0  Vitamin D deficiency Michelle Huerta has a diagnosis of Vitamin D deficiency. Last Vitamin D was 27.9 on 01/23/2019. She denies nausea, vomiting, or muscle weakness.  Hypertension (Stable) Michelle Huerta has a diagnosis of hypertension, which is stable. She is taking Norvasc and Mircardis.  Prediabetes Michelle Huerta has a diagnosis of prediabetes and denies polyphagia.  At risk for diabetes Michelle Huerta is at higher than average risk for developing diabetes due to her obesity.   ASSESSMENT AND PLAN:  Vitamin D deficiency - Plan: Vitamin D, Ergocalciferol, (DRISDOL) 1.25 MG (50000 UT) CAPS capsule, Vitamin D (25 hydroxy)  Essential hypertension  Prediabetes - Plan: metFORMIN (GLUCOPHAGE) 500 MG tablet, HgB A1c, Insulin, random  At risk for diabetes mellitus  Class 3 severe obesity with serious comorbidity and body mass index (BMI) of 40.0 to 44.9 in adult, unspecified obesity type (Michelle Huerta)  PLAN:  Vitamin D Deficiency Michelle Huerta was informed that low Vitamin D levels contributes to fatigue and are associated with obesity, breast, and colon cancer. She agrees to continue to take prescription Vit D @ 50,000 IU every week #4 with 0 refills and will have routine testing of Vitamin D. She was informed of the risk of over-replacement of Vitamin D and agrees to not increase her dose unless she discusses this with [place] first. Michelle Huerta agrees to  follow-up with our clinic in 3 weeks.  Hypertension (Stable) Michelle Huerta is working on healthy weight loss and exercise to improve blood pressure control. She will continue her medications as prescribed. We will watch for signs of hypotension as she continues her lifestyle modifications.  Pre-Diabetes Michelle Huerta will have A1c and insulin levels checked. She will continue metformin 500 mg 1 PO BID #60 with 0 refills and agrees to follow-up with our clinic in 3 weeks. Michelle Huerta will continue to work on weight loss, exercise, and decreasing simple carbohydrates to help decrease the risk of diabetes.   Diabetes risk counseling (~15 min) Michelle Huerta is a 58 y.o. female and has risk factors for diabetes including obesity. We discussed intensive lifestyle modifications today with an emphasis on weight loss as well as increasing exercise and decreasing simple carbohydrates in her diet.  Obesity Michelle Huerta is currently in the action stage of change. As such, her goal is to continue with weight loss efforts. She has agreed to follow the Category 3 plan. Michelle Huerta will work on meal planning, adhering more to the plan, increasing her water intake, and decreasing carbohydrates. Michelle Huerta has been instructed to work up to a goal of 150 minutes of combined cardio and strengthening exercise per week for weight loss and overall health benefits. We discussed the following Behavioral Modification Strategies today: increasing lean protein intake, decreasing simple carbohydrates, increasing vegetables, increase H20 intake, decrease eating out, no skipping meals, work on meal planning and easy cooking plans, and keeping healthy foods in the home.  Michelle Huerta has  agreed to follow-up with our clinic in 3 weeks. She was informed of the importance of frequent follow-up visits to maximize her success with intensive lifestyle modifications for her multiple health conditions.  ALLERGIES: Allergies  Allergen Reactions  . Penicillins Rash and Other (See Comments)     Has patient had a PCN reaction causing immediate rash, facial/tongue/throat swelling, SOB or lightheadedness with hypotension: Yes Has patient had a PCN reaction causing severe rash involving mucus membranes or skin necrosis: No Has patient had a PCN reaction that required hospitalization: No Has patient had a PCN reaction occurring within the last 10 years: No If all of the above answers are "NO", then may proceed with Cephalosporin use.   [name] Reclast [Zoledronic Acid] Nausea And Vomiting    MEDICATIONS: Current Outpatient Medications on File Prior to Visit  Medication Sig Dispense Refill  . albuterol (VENTOLIN HFA) 108 (90 Base) MCG/ACT inhaler Inhale 2 puffs into the lungs every 6 (six) hours as needed for wheezing or shortness of breath. 1 Inhaler 0  . amLODipine (NORVASC) 10 MG tablet Take 1 tablet (10 mg total) by mouth daily. 90 tablet 1  . betamethasone dipropionate 0.05 % cream Apply topically 2 (two) times daily. 45 g 5  . Estradiol 10 MCG INST Place 10 mcg vaginally 2 (two) times a week. 12 each 1  . famotidine (PEPCID) 20 MG tablet Take 1 tablet (20 mg total) by mouth 2 (two) times daily. 60 tablet 5  . fluticasone (FLONASE) 50 MCG/ACT nasal spray Place 2 sprays into both nostrils daily. 16 g 6  . ibuprofen (ADVIL,MOTRIN) 800 MG tablet Take 1 tablet (800 mg total) by mouth every 8 (eight) hours as needed. 90 tablet 0  . meclizine (ANTIVERT) 25 MG tablet Take 1 tablet (25 mg total) by mouth 3 (three) times daily as needed for dizziness. 90 tablet 0  . MELATONIN PO Take by mouth.    [name] omeprazole (PRILOSEC) 40 MG capsule omeprazole 40 mg capsule,delayed release    . ondansetron (ZOFRAN) 4 MG tablet Take 1 tablet (4 mg total) by mouth every 8 (eight) hours as needed for nausea or vomiting. 20 tablet 0  . pimecrolimus (ELIDEL) 1 % cream Apply topically 2 (two) times daily. 30 g 5  . telmisartan (MICARDIS) 80 MG tablet TAKE 1 TABLET BY MOUTH DAILY. 90 tablet 0  . temazepam (RESTORIL) 30  MG capsule     . traZODone (DESYREL) 50 MG tablet Take 1-2 tablets (50-100 mg total) by mouth at bedtime. 180 tablet 3  . triamcinolone cream (KENALOG) 0.1 % Apply 1 application topically 2 (two) times daily. 30 g 0  . valACYclovir (VALTREX) 500 MG tablet TAKE 1 TABLET BY MOUTH DAILY. 90 tablet 0   No current facility-administered medications on file prior to visit.    PAST MEDICAL HISTORY: Past Medical History:  Diagnosis Date  . Allergy   . Arthritis of both knees   . Asthma   . Bilateral chronic knee pain   . Chronic back pain   . Constipation   . DDD (degenerative disc disease), cervical   . Depression    no meds  . Genital herpes    HSV type 2 at rectum, culture positive 3/15  . Glaucoma   . Grade I diastolic dysfunction [id number]   noted on ECHO   . Heart murmur   . History of colon polyps   . HLD (hyperlipidemia)   . Hypertension   . Hypothyroidism   . Insomnia   .  Internal hemorrhoids   . Lactose intolerance   . Lupus ([place])   . LVH (left ventricular hypertrophy) 06/25/2015   Mild, noted on ECHO   . Mild sleep apnea    1/17 CPAP Dr Claiborne Billings does not use cpap   . Morbid obesity ([place]) 06/11/2015  . Osteoporosis   . Paget's disease   . Paget's disease of bone    Dr Trudie Reed, Reclast insufion 02/14/10 - bad reaction to infusion, now tolerates fosamax weekly  . Plantar fasciitis, bilateral   . PMB (postmenopausal bleeding)   . Pre-diabetes   . Sleep apnea   . Thrombocytopenia ([place])   . TR (tricuspid regurgitation) 06/25/2015   Trace, noted on ECHO   . Tubular adenoma polyp of rectum    8/12, Dr Fuller Plan, 5 yr follow up   . Uterine fibroid 10/19/2017   noted on CT pelvis  . Vitamin D deficiency     PAST SURGICAL HISTORY: Past Surgical History:  Procedure Laterality Date  . AUGMENTATION MAMMAPLASTY Bilateral   . BREAST ENHANCEMENT SURGERY    . BREAST REDUCTION SURGERY    . COLONOSCOPY  01/19/2016  . DILATION AND CURETTAGE OF UTERUS N/A 06/24/2015   Procedure:  DILATATION AND CURETTAGE;  Surgeon: Emily Filbert, MD;  Location: [facility];  Service: Gynecology;  Laterality: N/A;  . KNEE ARTHROSCOPY WITH SUBCHONDROPLASTY Right 05/03/2018   Procedure: RIGHT KNEE ARTHROSCOPY WITH DEBRIDEMENT, PARTIAL MEDIAL MENISCECTOMY SUBCHONDROPLASTY MEDIAL TIBIAL PLATEAU AND MEDIAL FEMORAL CONDYLE;  Surgeon: Mcarthur Rossetti, MD;  Location: [facility];  Service: Orthopedics;  Laterality: Right;  . REDUCTION MAMMAPLASTY Bilateral   . TOE SURGERY     removal of bone in each foot  . TUBAL LIGATION      SOCIAL HISTORY: Social History   Tobacco Use  . Smoking status: Former Smoker    Years: 7.00    Types: Cigars    Quit date: 08/11/2008    Years since quitting: 10.8  . Smokeless tobacco: Never Used  . Tobacco comment: smoked Black&Milds, 1 pack per day (5 in a pack)  Substance Use Topics  . Alcohol use: Yes    Alcohol/week: 5.0 standard drinks    Types: 5 Cans of beer per week    Comment: occ   . Drug use: Never    FAMILY HISTORY: Family History  Problem Relation Age of Onset  . Hypertension Mother   . Heart disease Mother   . Allergic rhinitis Mother   . Hypertension Father   . Hypertension Sister   . Cancer Maternal Grandmother   . Stomach cancer Paternal Grandmother   . Hypertension Son   . Hypertension Son   . Colon cancer Neg Hx    ROS: Review of Systems  Gastrointestinal: Negative for nausea and vomiting.  Musculoskeletal:       Negative for muscle weakness.  Endo/Heme/Allergies:       Negative for polyphagia.   PHYSICAL EXAM: Blood pressure 122/80, pulse 75, temperature 98.3 F (36.8 C), height 5\' 6"  (1.676 m), weight 275 lb (124.7 kg), last menstrual period 08/01/2012, SpO2 100 %. Body mass index is 44.39 kg/m. Physical Exam Vitals reviewed.  Constitutional:      Appearance: Normal appearance. She is obese.  Cardiovascular:     Rate and Rhythm: Normal rate.     Pulses: Normal pulses.  Pulmonary:     Effort: Pulmonary effort is  normal.     Breath sounds: Normal breath sounds.  Musculoskeletal:  General: Normal range of motion.  Skin:    General: Skin is warm and dry.  Neurological:     Mental Status: She is alert and oriented to person, place, and time.  Psychiatric:        Behavior: Behavior normal.   RECENT LABS AND TESTS: BMET    Component Value Date/Time   NA 142 06/05/2019 1148   K 3.9 06/05/2019 1148   CL 103 06/05/2019 1148   CO2 19 (L) 06/05/2019 1148   GLUCOSE 99 06/05/2019 1148   GLUCOSE 96 12/02/2018 1500   BUN 16 06/05/2019 1148   CREATININE 0.82 06/05/2019 1148   CALCIUM 9.6 06/05/2019 1148   GFRNONAA 80 06/05/2019 1148   GFRAA 93 06/05/2019 1148   Lab Results  Component Value Date   HGBA1C 5.5 02/07/2019   HGBA1C 5.9 (A) 09/10/2018   HGBA1C 6.3 (H) 05/30/2018   HGBA1C 6.0 (A) 03/09/2018   Lab Results  Component Value Date   INSULIN 37.3 (H) 02/07/2019   INSULIN 41.4 (H) 05/30/2018   CBC    Component Value Date/Time   WBC 5.8 12/07/2018 1153   WBC 5.1 12/02/2018 1500   RBC 4.68 12/07/2018 1153   RBC 4.70 12/02/2018 1500   HGB 12.7 12/07/2018 1153   HCT 37.5 12/07/2018 1153   PLT 138 (L) 12/07/2018 1153   MCV 80 12/07/2018 1153   MCH 27.1 12/07/2018 1153   MCH 27.2 12/02/2018 1500   MCHC 33.9 12/07/2018 1153   MCHC 31.5 12/02/2018 1500   RDW 13.5 12/07/2018 1153   LYMPHSABS 1.7 12/02/2018 1500   LYMPHSABS 2.4 05/30/2018 1116   MONOABS 0.3 12/02/2018 1500   EOSABS 0.0 12/02/2018 1500   EOSABS 0.1 05/30/2018 1116   BASOSABS 0.0 12/02/2018 1500   BASOSABS 0.0 05/30/2018 1116   Iron/TIBC/Ferritin/ %Sat No results found for: IRON, TIBC, FERRITIN, IRONPCTSAT Lipid Panel     Component Value Date/Time   CHOL 134 05/30/2018 1116   TRIG 85 05/30/2018 1116   HDL 75 05/30/2018 1116   CHOLHDL 1.8 05/30/2018 1116   LDLCALC 42 05/30/2018 1116   Hepatic Function Panel     Component Value Date/Time   PROT 8.0 06/05/2019 1148   ALBUMIN 4.2 06/05/2019 1148   AST  30 06/05/2019 1148   ALT 38 (H) 06/05/2019 1148   ALKPHOS 101 06/05/2019 1148   BILITOT 0.6 06/05/2019 1148      Component Value Date/Time   TSH 1.410 12/07/2018 1153   TSH 1.280 05/30/2018 1116   TSH 1.220 04/26/2018 1647   [facility] INTERVENTION VISIT DOCUMENTATION FOR INSURANCE (~15 minutes)  I, Michaelene Song, am acting as Location manager for [employer], DO  I have reviewed the above documentation for accuracy and completeness, and I agree with the above. Jearld Lesch, DO

## 2019-06-25 ENCOUNTER — Encounter (INDEPENDENT_AMBULATORY_CARE_PROVIDER_SITE_OTHER): Payer: Self-pay | Admitting: Bariatrics

## 2019-06-25 LAB — HEMOGLOBIN A1C
Est. average glucose Bld gHb Est-mCnc: 108 mg/dL
Hgb A1c MFr Bld: 5.4 % (ref 4.8–5.6)

## 2019-06-25 LAB — INSULIN, RANDOM: INSULIN: 36.3 u[IU]/mL — ABNORMAL HIGH (ref 2.6–24.9)

## 2019-06-25 LAB — VITAMIN D 25 HYDROXY (VIT D DEFICIENCY, FRACTURES): Vit D, 25-Hydroxy: 32.6 ng/mL (ref 30.0–100.0)

## 2019-06-27 ENCOUNTER — Other Ambulatory Visit: Payer: Self-pay | Admitting: Family Medicine

## 2019-06-27 MED FILL — AMLODIPINE BESYLATE 10 MG T: 10 | 90 days supply | Qty: 90 | Fill #1

## 2019-06-27 MED FILL — ALBUTEROL SULFATE HFA 108 (: 108 (90 BAS | 25 days supply | Qty: 18 | Fill #0

## 2019-06-27 MED FILL — FLUTICASONE PROP 50 MCG SPR: 50 | 30 days supply | Qty: 16 | Fill #2

## 2019-06-27 NOTE — Telephone Encounter (Signed)
Requested medication (s) are due for refill today: no  Requested medication (s) are on the active medication list: yes  Last refill:  12/10/18  Future visit scheduled: yes  Notes to clinic:  one inhaler should at last one month. Review for refill   Requested Prescriptions  Pending Prescriptions Disp Refills   albuterol (VENTOLIN HFA) 108 (90 Base) MCG/ACT inhaler [Pharmacy Med Name: ALBUTEROL SULFATE HFA 108 ( 108 (90 BAS AERO] 18 g 0    Sig: INHALE 2 PUFFS INTO THE LUNGS EVERY 6 HOURS AS NEEDED FOR WHEEZING OR SHORTNESS OF BREATH.      Pulmonology:  Beta Agonists Failed - 06/27/2019  9:10 AM      Failed - One inhaler should last at least one month. If the patient is requesting refills earlier, contact the patient to check for uncontrolled symptoms.      Passed - Valid encounter within last 12 months    Recent Outpatient Visits           3 weeks ago Essential (primary) hypertension   Primary Care at [facility], [place], MD   1 month ago Rash and nonspecific skin eruption   [facility], Delfino Lovett, NP   2 months ago Urticaria   [facility], Ranell Patrick, MD   2 months ago Allergic rhinitis, unspecified seasonality, unspecified trigger   [facility], Delfino Lovett, NP   2 months ago Dizzy spells   [facility], Delfino Lovett, NP       Future Appointments             In 1 month Forrest Moron, MD Primary Care at [place], [place]   In 2 months Kennith Gain, MD Allergy and [place]

## 2019-07-01 ENCOUNTER — Other Ambulatory Visit: Payer: Self-pay | Admitting: Orthopaedic Surgery

## 2019-07-01 MED FILL — IBUPROFEN 800 MG TABS: 800 | 30 days supply | Qty: 90 | Fill #0

## 2019-07-02 ENCOUNTER — Encounter: Payer: Self-pay | Admitting: Registered Nurse

## 2019-07-02 ENCOUNTER — Ambulatory Visit: Payer: 59 | Admitting: Registered Nurse

## 2019-07-02 ENCOUNTER — Other Ambulatory Visit: Payer: Self-pay

## 2019-07-02 ENCOUNTER — Telehealth: Payer: Self-pay | Admitting: Registered Nurse

## 2019-07-02 VITALS — BP 111/68 | HR 96 | Temp 97.9°F | Resp 17 | Wt 278.8 lb

## 2019-07-02 DIAGNOSIS — L500 Allergic urticaria: Secondary | ICD-10-CM

## 2019-07-02 DIAGNOSIS — R21 Rash and other nonspecific skin eruption: Secondary | ICD-10-CM

## 2019-07-02 HISTORY — DX: Allergic urticaria: L50.0

## 2019-07-02 MED ORDER — BETAMETHASONE DIPROPIONATE 0.05 % EX CREA: TOPICAL_CREAM | Freq: Two times a day (BID) | CUTANEOUS | 5 refills | Status: DC

## 2019-07-02 MED ORDER — PREDNISONE 10 MG PO TABS: ORAL_TABLET | ORAL | 0 refills | Status: DC

## 2019-07-02 MED FILL — BETAMETHASONE DP 0.05% CRM: 0.05 | 30 days supply | Qty: 45 | Fill #0

## 2019-07-02 NOTE — Telephone Encounter (Signed)
Pt saw Bosie Clos and pt was looking to pick up Prednisone . Please review pt chart and send if possible   FR

## 2019-07-02 NOTE — Progress Notes (Signed)
Acute Office Visit  Subjective:    Patient ID: Michelle Huerta, female    DOB: 04-Dec-1960, 58 y.o.   MRN: [id number]  Chief Complaint  Patient presents with  . Rash    rash on right shoulder x 2 days. was seen by urgent care on 06/15/19 and was given prednisone , and cream. Went away then the rash returned in the same location 3 days ago    HPI Patient is in today for rash.  Reports that after our last visit for this issue in October, she was seen by allergy and dermatology. She determined that she was allergic to dust.  Reports that a few days ago after dusting, she broke out with a red, puffy rash around her neck and shoulders. Mostly on L side. Went to urgent care, given prednisone taper, resolved. Today little evidence remains. Pictures show an angry, red rash. No vesicles or heads to lesions. No drainage.   Past Medical History:  Diagnosis Date  . Allergy   . Arthritis of both knees   . Asthma   . Bilateral chronic knee pain   . Chronic back pain   . Constipation   . DDD (degenerative disc disease), cervical   . Depression    no meds  . Genital herpes    HSV type 2 at rectum, culture positive 3/15  . Glaucoma   . Grade I diastolic dysfunction [id number]   noted on ECHO   . Heart murmur   . History of colon polyps   . HLD (hyperlipidemia)   . Hypertension   . Hypothyroidism   . Insomnia   . Internal hemorrhoids   . Lactose intolerance   . Lupus ([place])   . LVH (left ventricular hypertrophy) 06/25/2015   Mild, noted on ECHO   . Mild sleep apnea    1/17 CPAP Dr Claiborne Billings does not use cpap   . Morbid obesity ([place]) 06/11/2015  . Osteoporosis   . Paget's disease   . Paget's disease of bone    Dr Trudie Reed, Reclast insufion 02/14/10 - bad reaction to infusion, now tolerates fosamax weekly  . Plantar fasciitis, bilateral   . PMB (postmenopausal bleeding)   . Pre-diabetes   . Sleep apnea   . Thrombocytopenia ([place])   . TR (tricuspid regurgitation) 06/25/2015   Trace, noted on  ECHO   . Tubular adenoma polyp of rectum    8/12, Dr Fuller Plan, 5 yr follow up   . Uterine fibroid 10/19/2017   noted on CT pelvis  . Vitamin D deficiency     Past Surgical History:  Procedure Laterality Date  . AUGMENTATION MAMMAPLASTY Bilateral   . BREAST ENHANCEMENT SURGERY    . BREAST REDUCTION SURGERY    . COLONOSCOPY  01/19/2016  . DILATION AND CURETTAGE OF UTERUS N/A 06/24/2015   Procedure: DILATATION AND CURETTAGE;  Surgeon: Emily Filbert, MD;  Location: [facility];  Service: Gynecology;  Laterality: N/A;  . KNEE ARTHROSCOPY WITH SUBCHONDROPLASTY Right 05/03/2018   Procedure: RIGHT KNEE ARTHROSCOPY WITH DEBRIDEMENT, PARTIAL MEDIAL MENISCECTOMY SUBCHONDROPLASTY MEDIAL TIBIAL PLATEAU AND MEDIAL FEMORAL CONDYLE;  Surgeon: Mcarthur Rossetti, MD;  Location: [facility];  Service: Orthopedics;  Laterality: Right;  . REDUCTION MAMMAPLASTY Bilateral   . TOE SURGERY     removal of bone in each foot  . TUBAL LIGATION      Family History  Problem Relation Age of Onset  . Hypertension Mother   . Heart disease Mother   . Allergic rhinitis Mother   .  Hypertension Father   . Hypertension Sister   . Cancer Maternal Grandmother   . Stomach cancer Paternal Grandmother   . Hypertension Son   . Hypertension Son   . Colon cancer Neg Hx     Social History   Socioeconomic History  . Marital status: Single    Spouse name: Not on file  . Number of children: Not on file  . Years of education: Not on file  . Highest education level: Not on file  Occupational History  . Occupation: Housekeeping- [employer]  Tobacco Use  . Smoking status: Former Smoker    Years: 7.00    Types: Cigars    Quit date: 08/11/2008    Years since quitting: 10.8  . Smokeless tobacco: Never Used  . Tobacco comment: smoked Black&Milds, 1 pack per day (5 in a pack)  Substance and Sexual Activity  . Alcohol use: Yes    Alcohol/week: 5.0 standard drinks    Types: 5 Cans of beer per week    Comment: occ   . Drug use: Never    . Sexual activity: Not Currently    Partners: Male    Birth control/protection: Surgical  Other Topics Concern  . Not on file  Social History Narrative   Epworth sleepiness score as of 06/11/15 a 4   Exercise: yes, active at work, no formal exercise   Single, never married, in relationship off and on   Children: [place], [place] (both in [place]), 2 grandkids.  Raised 2 nieces [name], [address] 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 Difficulty of Paying Living Expenses: Not on file  Food Insecurity:   . Worried About Charity fundraiser in the Last Year: Not on file  . Ran Out of Food in the Last Year: Not on file  Transportation Needs:   . Lack of Transportation (Medical): Not on file  . Lack of Transportation (Non-Medical): Not on file  Physical Activity:   . Days of Exercise per Week: Not on file  . Minutes of Exercise per Session: Not on file  Stress:   . Feeling of Stress : Not on file  Social Connections:   . Frequency of Communication with Friends and Family: Not on file  . Frequency of Social Gatherings with Friends and Family: Not on file  . Attends Religious Services: Not on file  . Active Member of Clubs or Organizations: Not on file  . Attends Archivist Meetings: Not on file  . Marital Status: Not on file  Intimate Partner Violence:   . Fear of Current or Ex-Partner: Not on file  . Emotionally Abused: Not on file  . Physically Abused: Not on file  . Sexually Abused: Not on file    Outpatient Medications Prior to Visit  Medication Sig Dispense Refill  . albuterol (VENTOLIN HFA) 108 (90 Base) MCG/ACT inhaler INHALE 2 PUFFS INTO THE LUNGS EVERY 6 HOURS AS NEEDED FOR WHEEZING OR SHORTNESS OF BREATH. 18 g 0  . amLODipine (NORVASC) 10 MG tablet Take 1 tablet (10 mg total) by mouth daily. 90  tablet 1  . Estradiol 10 MCG INST Place 10 mcg vaginally 2 (two) times a week. 12 each 1  . famotidine (PEPCID) 20 MG tablet Take 1 tablet (20 mg total) by mouth 2 (two) times daily.  60 tablet 5  . fluticasone (FLONASE) 50 MCG/ACT nasal spray Place 2 sprays into both nostrils daily. 16 g 6  . ibuprofen (ADVIL) 800 MG tablet TAKE 1 TABLET (800 MG TOTAL) BY MOUTH EVERY 8 HOURS AS NEEDED. 90 tablet 0  . meclizine (ANTIVERT) 25 MG tablet Take 1 tablet (25 mg total) by mouth 3 (three) times daily as needed for dizziness. 90 tablet 0  . MELATONIN PO Take by mouth.    . metFORMIN (GLUCOPHAGE) 500 MG tablet Take 1 tablet (500 mg total) by mouth 2 (two) times daily with a meal. 60 tablet 0  . omeprazole (PRILOSEC) 40 MG capsule omeprazole 40 mg capsule,delayed release    . ondansetron (ZOFRAN) 4 MG tablet Take 1 tablet (4 mg total) by mouth every 8 (eight) hours as needed for nausea or vomiting. 20 tablet 0  . pimecrolimus (ELIDEL) 1 % cream Apply topically 2 (two) times daily. 30 g 5  . telmisartan (MICARDIS) 80 MG tablet TAKE 1 TABLET BY MOUTH DAILY. 90 tablet 0  . temazepam (RESTORIL) 30 MG capsule     . traZODone (DESYREL) 50 MG tablet Take 1-2 tablets (50-100 mg total) by mouth at bedtime. 180 tablet 3  . triamcinolone cream (KENALOG) 0.1 % Apply 1 application topically 2 (two) times daily. 30 g 0  . valACYclovir (VALTREX) 500 MG tablet TAKE 1 TABLET BY MOUTH DAILY. 90 tablet 0  . Vitamin D, Ergocalciferol, (DRISDOL) 1.25 MG (50000 UT) CAPS capsule Take 1 capsule (50,000 Units total) by mouth every 7 (seven) days. 4 capsule 0  . betamethasone dipropionate 0.05 % cream Apply topically 2 (two) times daily. 45 g 5   No facility-administered medications prior to visit.    Allergies  Allergen Reactions  . Penicillins Rash and Other (See Comments)    Has patient had a PCN reaction causing immediate rash, facial/tongue/throat swelling, SOB or lightheadedness with hypotension: Yes Has patient had a PCN  reaction causing severe rash involving mucus membranes or skin necrosis: No Has patient had a PCN reaction that required hospitalization: No Has patient had a PCN reaction occurring within the last 10 years: No If all of the above answers are "NO", then may proceed with Cephalosporin use.   [name] Reclast [Zoledronic Acid] Nausea And Vomiting    Review of Systems  Constitutional: Negative.   HENT: Negative.   Eyes: Negative.   Respiratory: Negative.   Cardiovascular: Negative.   Gastrointestinal: Negative.   Endocrine: Negative.   Genitourinary: Negative.   Musculoskeletal: Negative.   Skin: Positive for rash. Negative for color change, pallor and wound.  Allergic/Immunologic: Negative.   Neurological: Negative.   Hematological: Negative.   Psychiatric/Behavioral: Negative.   All other systems reviewed and are negative.      Objective:    Physical Exam Vitals and nursing note reviewed.  Constitutional:      Appearance: Normal appearance. She is normal weight.  HENT:     Head: Normocephalic.  Cardiovascular:     Rate and Rhythm: Normal rate and regular rhythm.  Pulmonary:     Effort: Pulmonary effort is normal. No respiratory distress.  Skin:      Neurological:     General: No focal deficit present.     Mental Status: She is alert and oriented to person, place, and time. Mental status is at baseline.  Psychiatric:        Mood and Affect: Mood normal.        Behavior: Behavior normal.  Thought Content: Thought content normal.        Judgment: Judgment normal.     BP 111/68 (BP Location: Right Arm, Patient Position: Sitting, Cuff Size: Large)   Pulse 96   Temp 97.9 F (36.6 C) (Temporal)   Resp 17   Wt 278 lb 12.8 oz (126.5 kg)   LMP 08/01/2012   SpO2 100%   BMI 45.00 kg/m  Wt Readings from Last 3 Encounters:  07/02/19 278 lb 12.8 oz (126.5 kg)  06/24/19 275 lb (124.7 kg)  06/05/19 278 lb 6.4 oz (126.3 kg)    There are no preventive care reminders to  display for this patient.  There are no preventive care reminders to display for this patient.   Lab Results  Component Value Date   TSH 1.410 12/07/2018   Lab Results  Component Value Date   WBC 5.8 12/07/2018   HGB 12.7 12/07/2018   HCT 37.5 12/07/2018   MCV 80 12/07/2018   PLT 138 (L) 12/07/2018   Lab Results  Component Value Date   NA 142 06/05/2019   K 3.9 06/05/2019   CO2 19 (L) 06/05/2019   GLUCOSE 99 06/05/2019   BUN 16 06/05/2019   CREATININE 0.82 06/05/2019   BILITOT 0.6 06/05/2019   ALKPHOS 101 06/05/2019   AST 30 06/05/2019   ALT 38 (H) 06/05/2019   PROT 8.0 06/05/2019   ALBUMIN 4.2 06/05/2019   CALCIUM 9.6 06/05/2019   ANIONGAP 8 12/02/2018   Lab Results  Component Value Date   CHOL 134 05/30/2018   Lab Results  Component Value Date   HDL 75 05/30/2018   Lab Results  Component Value Date   LDLCALC 42 05/30/2018   Lab Results  Component Value Date   TRIG 85 05/30/2018   Lab Results  Component Value Date   CHOLHDL 1.8 05/30/2018   Lab Results  Component Value Date   HGBA1C 5.4 06/24/2019       Assessment & Plan:   Problem List Items Addressed This Visit    None    Visit Diagnoses    Allergic urticaria    -  Primary   Relevant Medications   predniSONE (DELTASONE) 10 MG tablet   betamethasone dipropionate 0.05 % cream   Rash and nonspecific skin eruption       Relevant Medications   predniSONE (DELTASONE) 10 MG tablet   betamethasone dipropionate 0.05 % cream       Meds ordered this encounter  Medications  . predniSONE (DELTASONE) 10 MG tablet    Sig: Take 3 tablets (30 mg total) by mouth daily with breakfast for 3 days, THEN 3 tablets (30 mg total) daily with breakfast for 3 days, THEN 1 tablet (10 mg total) daily with breakfast for 3 days.    Dispense:  21 tablet    Refill:  0    Order Specific Question:   Supervising Provider    Answer:   Delia Chimes A [id number]  . betamethasone dipropionate 0.05 % cream    Sig:  Apply topically 2 (two) times daily.    Dispense:  135 g    Refill:  5    Order Specific Question:   Supervising Provider    Answer:   Forrest Moron [id number]   PLAN  Discussed urticaria and it's pathophysiology. She demonstrates understanding  Given her impending change in insurance, will give refill on prednisone taper and betamethasone. Also suggest she get OTC antihistamine tablets and benadryl to use if needed  Otherwise, continue follow up as PCP requests  Patient encouraged to call clinic with any questions, comments, or concerns.   Maximiano Coss, NP

## 2019-07-03 MED ORDER — PREDNISONE 10 MG PO TABS: ORAL_TABLET | ORAL | 0 refills | Status: AC

## 2019-07-03 MED FILL — predniSONE 10 MG TABS: 10 | 9 days supply | Qty: 18 | Fill #0

## 2019-07-03 NOTE — Telephone Encounter (Signed)
Covering for [employer].  Correction made to prednisone taper. Sent for fax.

## 2019-07-03 NOTE — Telephone Encounter (Signed)
done

## 2019-07-16 ENCOUNTER — Ambulatory Visit (INDEPENDENT_AMBULATORY_CARE_PROVIDER_SITE_OTHER): Payer: 59 | Admitting: Bariatrics

## 2019-07-31 ENCOUNTER — Other Ambulatory Visit: Payer: Self-pay | Admitting: Family Medicine

## 2019-07-31 DIAGNOSIS — I10 Essential (primary) hypertension: Secondary | ICD-10-CM

## 2019-08-02 MED FILL — FLUOROMETHOLONE 0.1% DROPS: 0.1 | 9 days supply | Qty: 5 | Fill #0

## 2019-08-07 ENCOUNTER — Ambulatory Visit: Payer: 59 | Admitting: Physician Assistant

## 2019-08-07 ENCOUNTER — Other Ambulatory Visit: Payer: Self-pay

## 2019-08-07 ENCOUNTER — Encounter: Payer: Self-pay | Admitting: Physician Assistant

## 2019-08-07 VITALS — Ht 67.0 in | Wt 286.0 lb

## 2019-08-07 DIAGNOSIS — M1711 Unilateral primary osteoarthritis, right knee: Secondary | ICD-10-CM | POA: Diagnosis not present

## 2019-08-07 MED ORDER — DICLOFENAC SODIUM 1 % EX GEL: 4.0000 g | Freq: Four times a day (QID) | CUTANEOUS | 1 refills | Status: DC

## 2019-08-07 MED FILL — DICLOFENAC SODIUM 1 % GEL: 1 | 18 days supply | Qty: 300 | Fill #0

## 2019-08-07 NOTE — Progress Notes (Addendum)
Office Visit Note   Patient: Michelle Huerta           Date of Birth: Jan 18, 1961           MRN: [id number] Visit Date: 08/07/2019              Requested by: Forrest Moron, MD [place],  [place] [address] PCP: Forrest Moron, MD   Assessment & Plan: Visit Diagnoses:  1. Unilateral primary osteoarthritis, right knee     Plan: We will have her try Voltaren gel to the knee 4 times daily.  Continue to work on weight loss.  Also discussed with her trying turmeric for at least 3 weeks and see if this makes a difference in the knee pain.  See back in 3 months to see what type of progress she is made in regards to weight loss.  Questions encouraged and answered.   Follow-Up Instructions: Return in about 3 months (around 11/05/2019).   Orders:  No orders of the defined types were placed in this encounter.  Meds ordered this encounter  Medications  . diclofenac Sodium (VOLTAREN) 1 % GEL    Sig: Apply 4 g topically 4 (four) times daily.    Dispense:  350 g    Refill:  1      Procedures: No procedures performed   Clinical Data: No additional findings.   Subjective: No chief complaint on file.   HPI Michelle Huerta is well-known to Dr.Blackman's service comes in today for follow-up of her left knee pain.  She has known osteoarthritis of the right knee and is working towards weight loss so she can undergo a right total knee arthroplasty.  She states she lost some weight and is now gained some weight back.  She is having deep knee pain that is constant at times sharp pain with walking greater than 30 to 40 minutes.  She has tried ibuprofen which really does not help much.  She had an injection in December from her rheumatologist which only lasted for about 3 to 4 weeks at the most. Review of Systems No fevers chills shortness of breath chest pain  Objective: Vital Signs: Ht 5\' 7"  (1.702 m)   Wt 286 lb (129.7 kg)   LMP 08/01/2012   BMI 44.79 kg/m   Physical  Exam Constitutional:      Appearance: She is obese. She is not ill-appearing or diaphoretic.  Pulmonary:     Effort: Pulmonary effort is normal.  Neurological:     Mental Status: She is alert and oriented to person, place, and time.     Ortho Exam Right knee good range of motion.  No abnormal warmth erythema or effusion.  Positive crepitus with passive range of motion of the knee. Specialty Comments:  No specialty comments available.  Imaging: No results found.   PMFS History: Patient Active Problem List   Diagnosis Date Noted  . Rash and nonspecific skin eruption 07/02/2019  . Allergic urticaria 07/02/2019  . Uterine leiomyoma 03/11/2019  . Fatigue 12/06/2018  . Essential hypertension 12/06/2018  . Arterial hypotension 12/06/2018  . Nausea in adult 12/06/2018  . Anorexia 12/06/2018  . Other insomnia 12/06/2018  . Acute recurrent sinusitis 12/05/2018  . Vitamin D deficiency 07/26/2018  . Class 3 severe obesity with serious comorbidity and body mass index (BMI) of 45.0 to 49.9 in adult ([place]) 07/26/2018  . Status post arthroscopic surgery of right knee 05/09/2018  . Chronic pain of right knee 05/09/2018  .  Closed fracture of right tibial plateau   . Unilateral primary osteoarthritis, right knee 04/23/2018  . Acute medial meniscus tear of right knee 04/23/2018  . Closed fracture of medial plateau of left tibia 04/23/2018  . Severe obesity (BMI >= 40) ([place]) 04/23/2018  . Osteoarthritis of right knee 03/09/2018  . Prediabetes 03/09/2018  . Bilateral knee pain 12/28/2017  . Constipation 12/18/2017  . Cough 12/15/2017  . Lower resp. tract infection 12/15/2017  . Acute conjunctivitis of right eye 12/15/2017  . H/O Paget's disease of bone 09/10/2017  . Hypertensive disorder 08/12/2016  . OSA on CPAP 10/28/2015  . Fibroids 08/07/2015  . Plantar fasciitis, bilateral 08/03/2015  . Metatarsal deformity 08/03/2015  . Equinus deformity of foot, acquired 08/03/2015  . Pronation  deformity of both feet 08/03/2015  . Morbid obesity ([place]) 06/11/2015  . 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 Essential (primary) hypertension 06/11/2015  . 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 Benign essential HTN 09/19/2013  . Major depressive episode 09/19/2013   Past Medical History:  Diagnosis Date  . Allergic urticaria 07/02/2019  . Allergy   . Arthritis of both knees   . Asthma   . Bilateral chronic knee pain   . Chronic back pain   . Constipation   . DDD (degenerative disc disease), cervical   . Depression    no meds  . Genital herpes    HSV type 2 at rectum, culture positive 3/15  . Glaucoma   . Grade I diastolic dysfunction A999333   noted on ECHO   . Heart murmur   . History of colon polyps   . HLD (hyperlipidemia)   . Hypertension   . Hypothyroidism   . Insomnia   . Internal hemorrhoids   . Lactose intolerance   . Lupus ([place])   . LVH (left ventricular hypertrophy) 06/25/2015   Mild, noted on ECHO   . Mild sleep apnea    1/17 CPAP Dr Claiborne Billings does not use cpap   . Morbid obesity ([place]) 06/11/2015  . Osteoporosis   . Paget's disease   . Paget's disease of bone    Dr Trudie Reed, Reclast insufion 02/14/10 - bad reaction to infusion, now tolerates fosamax weekly  . Plantar fasciitis, bilateral   . PMB (postmenopausal bleeding)   . Pre-diabetes   . Sleep apnea   . Thrombocytopenia ([place])   . TR (tricuspid regurgitation) 06/25/2015   Trace, noted on ECHO   . Tubular adenoma polyp of rectum    8/12, Dr Fuller Plan, 5 yr follow up   . Uterine fibroid 10/19/2017   noted on CT pelvis  . Vitamin D deficiency     Family History  Problem Relation Age of Onset  . Hypertension Mother   . Heart disease Mother   . Allergic rhinitis Mother   . Hypertension Father   . Hypertension Sister   . Cancer Maternal Grandmother   . Stomach cancer Paternal Grandmother   . Hypertension Son   . Hypertension Son   . Colon cancer Neg Hx     Past Surgical History:  Procedure  Laterality Date  . AUGMENTATION MAMMAPLASTY Bilateral   . BREAST ENHANCEMENT SURGERY    . BREAST REDUCTION SURGERY    . COLONOSCOPY  01/19/2016  . DILATION AND CURETTAGE OF UTERUS N/A 06/24/2015   Procedure: DILATATION AND CURETTAGE;  Surgeon: Emily Filbert, MD;  Location: [facility];  Service: Gynecology;  Laterality: N/A;  . KNEE ARTHROSCOPY WITH SUBCHONDROPLASTY Right 05/03/2018   Procedure: RIGHT  KNEE ARTHROSCOPY WITH DEBRIDEMENT, PARTIAL MEDIAL MENISCECTOMY SUBCHONDROPLASTY MEDIAL TIBIAL PLATEAU AND MEDIAL FEMORAL CONDYLE;  Surgeon: Mcarthur Rossetti, MD;  Location: [facility];  Service: Orthopedics;  Laterality: Right;  . REDUCTION MAMMAPLASTY Bilateral   . TOE SURGERY     removal of bone in each foot  . TUBAL LIGATION     Social History   Occupational History  . Occupation: Housekeeping- [employer]  Tobacco Use  . Smoking status: Former Smoker    Years: 7.00    Types: Cigars    Quit date: 08/11/2008    Years since quitting: 10.9  . Smokeless tobacco: Never Used  . Tobacco comment: smoked Black&Milds, 1 pack per day (5 in a pack)  Substance and Sexual Activity  . Alcohol use: Yes    Alcohol/week: 5.0 standard drinks    Types: 5 Cans of beer per week    Comment: occ   . Drug use: Never  . Sexual activity: Not Currently    Partners: Male    Birth control/protection: Surgical

## 2019-08-12 ENCOUNTER — Ambulatory Visit: Payer: 59 | Admitting: Family Medicine

## 2019-08-12 ENCOUNTER — Telehealth: Payer: Self-pay | Admitting: Orthopedic Surgery

## 2019-08-12 NOTE — Telephone Encounter (Signed)
Michelle Huerta called to let [name] know that she did not need her to fill out her paperwork.  She states that she is disabled and will not be returning to work.  She will ask another one of her physicians to fill it out.

## 2019-08-23 ENCOUNTER — Ambulatory Visit
Admission: RE | Admit: 2019-08-23 | Discharge: 2019-08-23 | Disposition: A | Discharge status: Home / Self Care | Payer: 59 | Source: Ambulatory Visit | Attending: Family Medicine | Admitting: Family Medicine

## 2019-08-23 ENCOUNTER — Other Ambulatory Visit: Payer: Self-pay

## 2019-08-23 DIAGNOSIS — Z1231 Encounter for screening mammogram for malignant neoplasm of breast: Secondary | ICD-10-CM

## 2019-08-29 IMAGING — CT CT HEAD WITHOUT CONTRAST
3 series · 16 of 47 positions shown, 19 images · non-contrast
Comparison: May 01, 2013

CLINICAL DATA: Headache for 3 days

EXAM:
CT HEAD WITHOUT CONTRAST
TECHNIQUE: Contiguous axial images were obtained from the base of the skull
through the vertex without intravenous contrast.

[Series 2: head wo · axial · 0.44mm/px · z∈[-149,-19]mm · 10 of 32 slices shown, 13 images]
[im 3/32  brain]
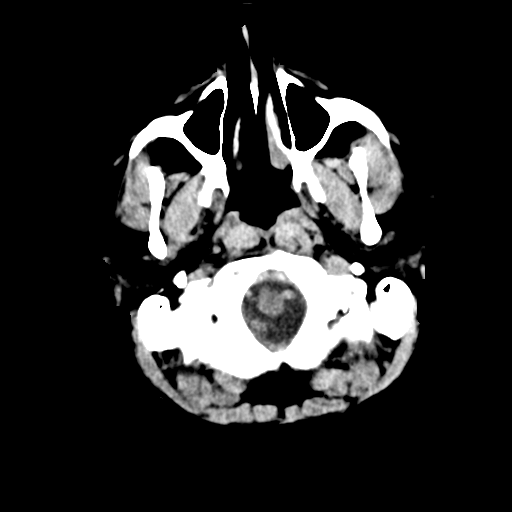
[im 3/32  bone]
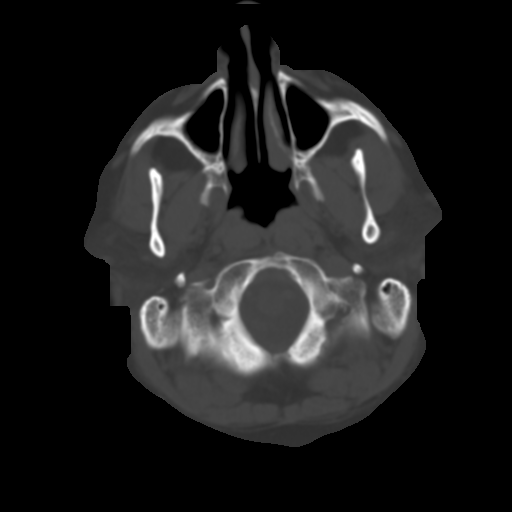
[im 6/32  brain]
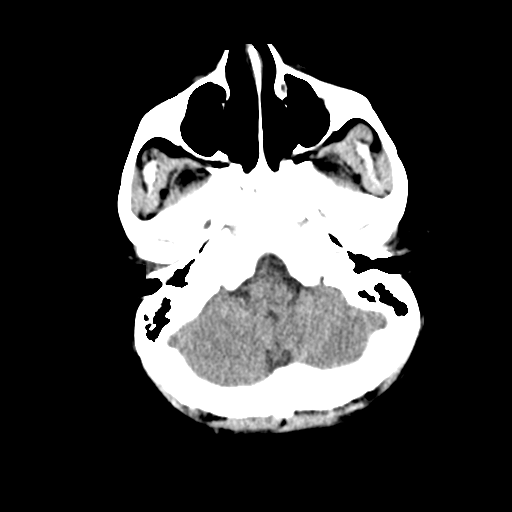
[im 9/32  brain]
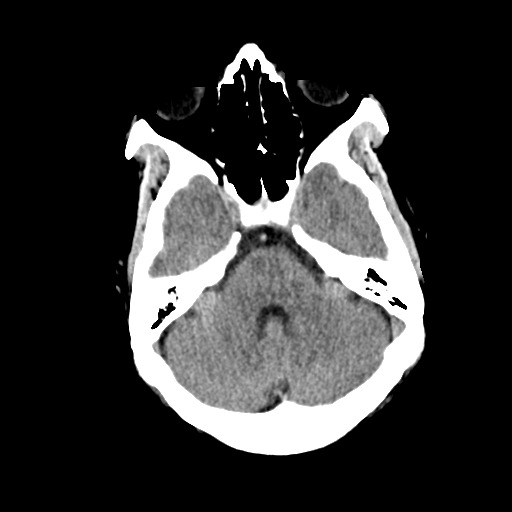
[im 11/32  brain]
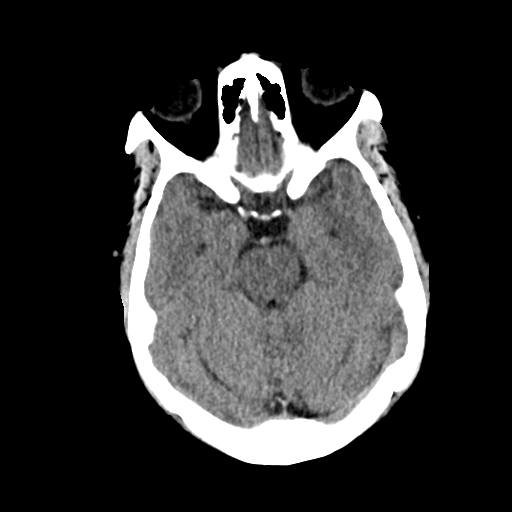
[im 14/32  brain]
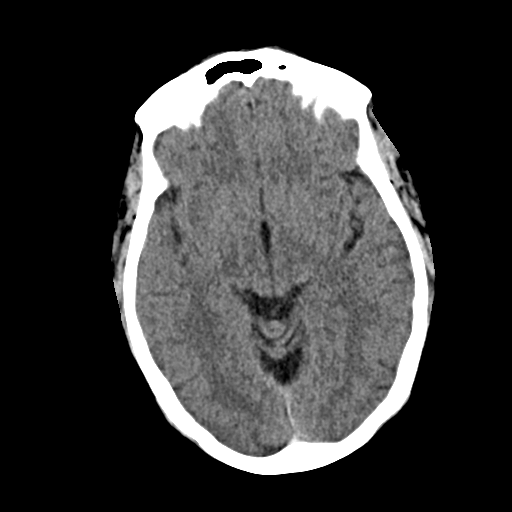
[im 14/32  bone]
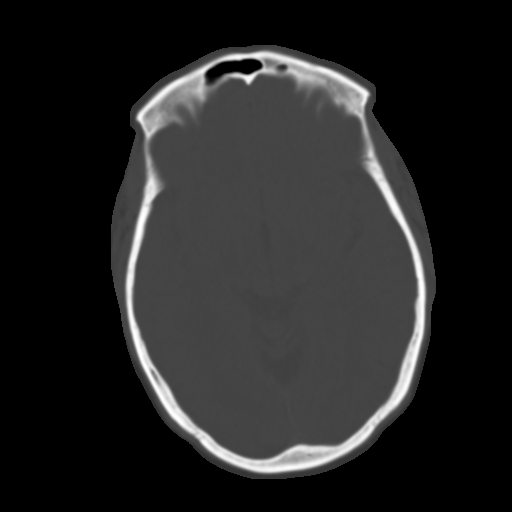
[im 18/32  brain]
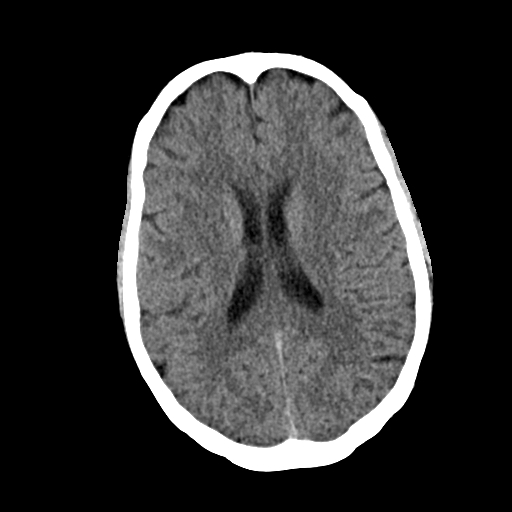
[im 21/32  brain]
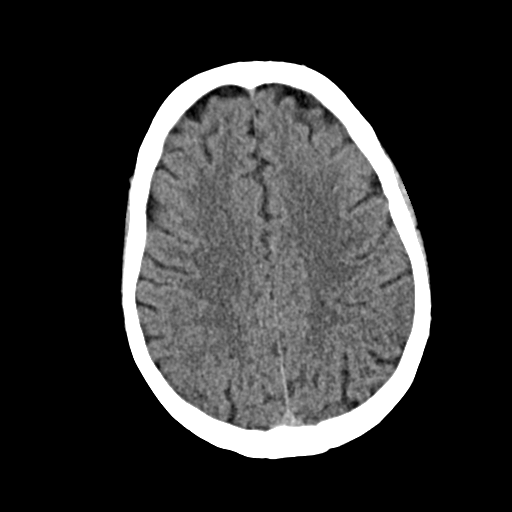
[im 24/32  brain]
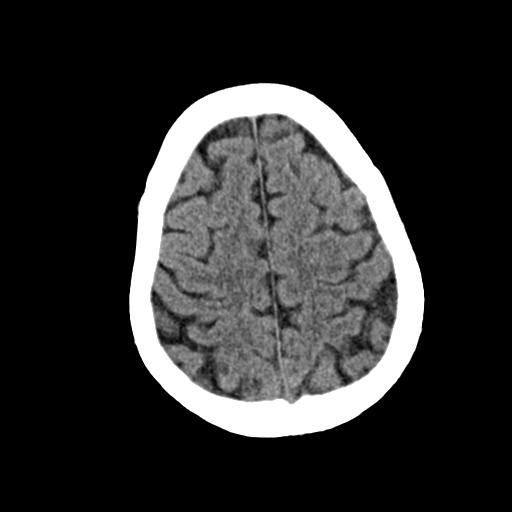
[im 26/32  brain]
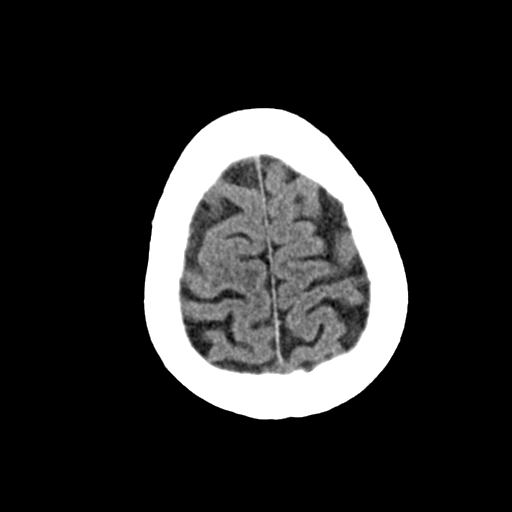
[im 26/32  bone]
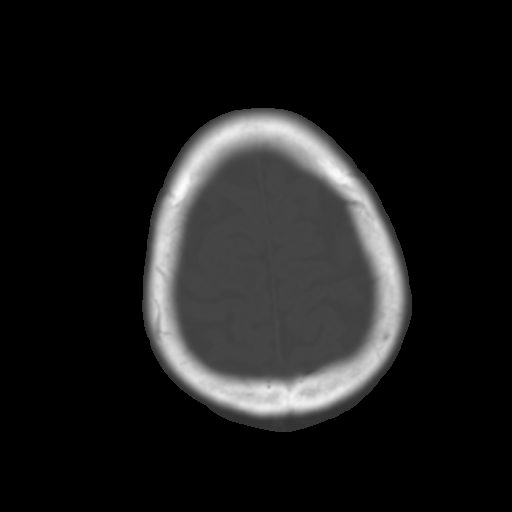
[im 29/32  brain]
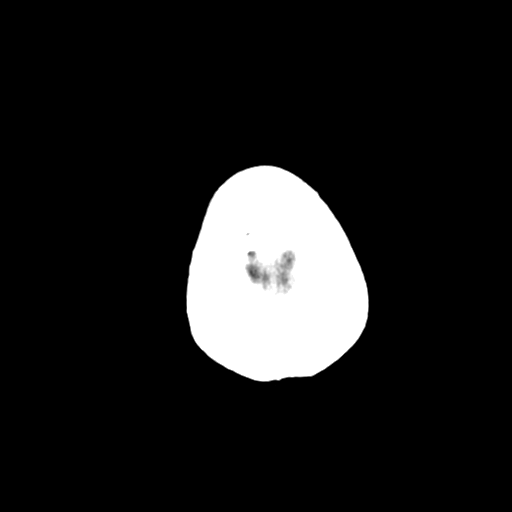

[Series 4: coronal soft · coronal · 0.37mm/px · 3 of 72 slices shown]
[im 24/72  brain]
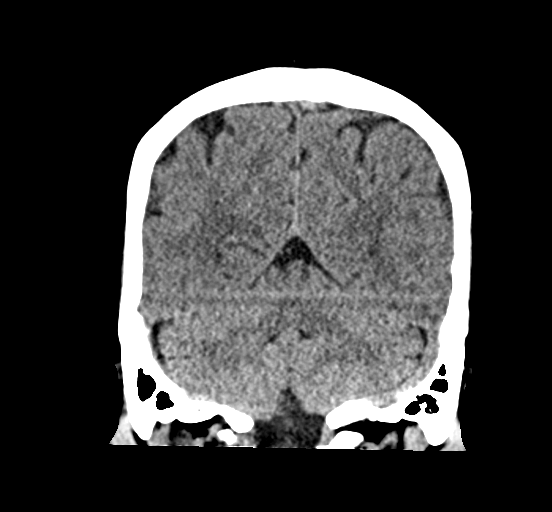
[im 32/72  brain]
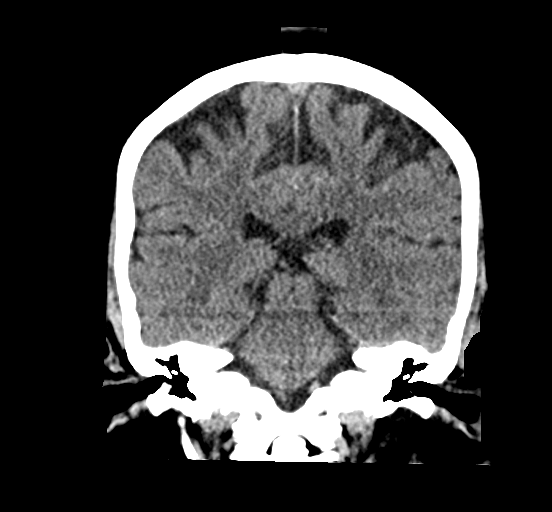
[im 40/72  brain]
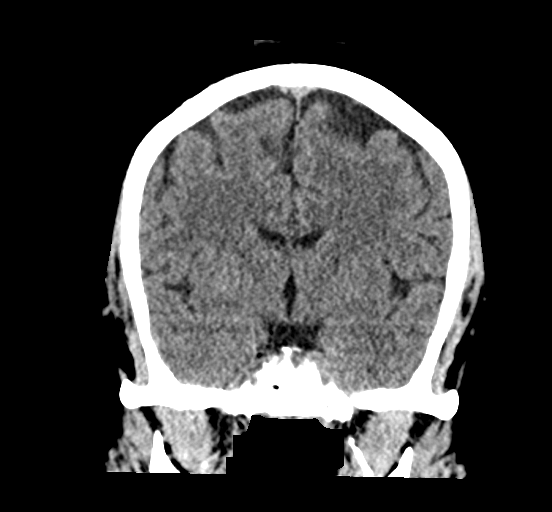

[Series 5: sag soft · sagittal · 0.37mm/px · 3 of 69 slices shown]
[im 23/69  brain]
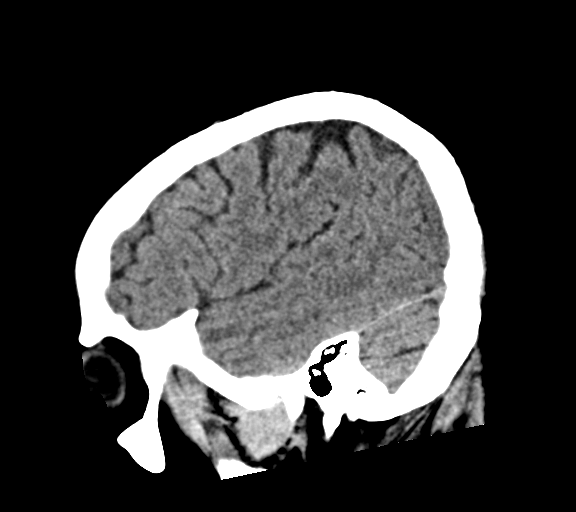
[im 35/69  brain]
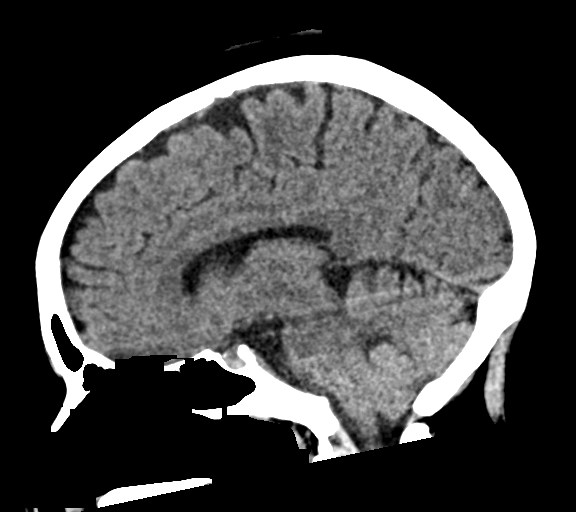
[im 46/69  brain]
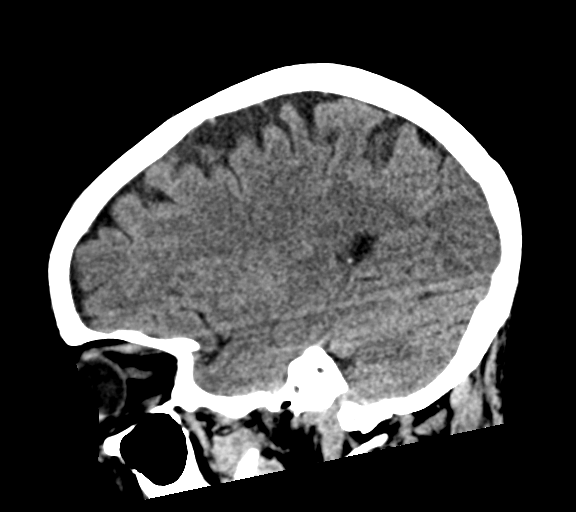

[16 of 47 positions shown; findings below may reference images not displayed]

FINDINGS: Brain: Ventricles are normal in size and configuration. There is no
intracranial mass, hemorrhage, extra-axial fluid collection, or
midline shift. The brain parenchyma appears unremarkable. No acute
infarct evident.

Vascular: There is no hyperdense vessel. There is no appreciable
vascular calcification.

Skull: The bony calvarium appears intact.

Sinuses/Orbits: Paranasal sinuses are clear. Orbits appear symmetric
bilaterally.

Other: Mastoid air cells are clear. There is debris in each external
auditory canal.
IMPRESSION: Probable cerumen in each external auditory canal. Study otherwise
unremarkable.

## 2019-08-30 ENCOUNTER — Other Ambulatory Visit (HOSPITAL_COMMUNITY): Payer: Self-pay | Admitting: Family Medicine

## 2019-08-30 DIAGNOSIS — I10 Essential (primary) hypertension: Secondary | ICD-10-CM | POA: Diagnosis not present

## 2019-08-30 DIAGNOSIS — G4700 Insomnia, unspecified: Secondary | ICD-10-CM | POA: Diagnosis not present

## 2019-08-30 DIAGNOSIS — Z0000 Encounter for general adult medical examination without abnormal findings: Secondary | ICD-10-CM | POA: Diagnosis not present

## 2019-08-30 DIAGNOSIS — L930 Discoid lupus erythematosus: Secondary | ICD-10-CM | POA: Diagnosis not present

## 2019-08-30 DIAGNOSIS — K8020 Calculus of gallbladder without cholecystitis without obstruction: Secondary | ICD-10-CM | POA: Diagnosis not present

## 2019-08-30 DIAGNOSIS — E559 Vitamin D deficiency, unspecified: Secondary | ICD-10-CM | POA: Diagnosis not present

## 2019-08-30 DIAGNOSIS — M889 Osteitis deformans of unspecified bone: Secondary | ICD-10-CM | POA: Diagnosis not present

## 2019-08-30 MED FILL — TELMISARTAN 80 MG TABLET: 80 | 90 days supply | Qty: 90 | Fill #0

## 2019-08-30 MED FILL — traZODone HCL 50 MG TABS: 50 | 90 days supply | Qty: 180 | Fill #0

## 2019-08-30 MED FILL — metFORMIN HCL 500 MG TABS: 500 | 90 days supply | Qty: 90 | Fill #0

## 2019-08-30 MED FILL — VIT D2 1.25 MG (50,000 UNIT: 1.25 MG | 84 days supply | Qty: 12 | Fill #0

## 2019-09-03 ENCOUNTER — Other Ambulatory Visit: Payer: Self-pay | Admitting: Family Medicine

## 2019-09-03 DIAGNOSIS — A6000 Herpesviral infection of urogenital system, unspecified: Secondary | ICD-10-CM

## 2019-09-03 MED FILL — VALACYCLOVIR HCL 500 MG TAB: 500 | 90 days supply | Qty: 90 | Fill #0

## 2019-09-03 MED FILL — TELMISARTAN 80 MG TABLET: 80 | 90 days supply | Qty: 90 | Fill #0

## 2019-09-03 NOTE — Telephone Encounter (Signed)
Requested Prescriptions  Pending Prescriptions Disp Refills  . valACYclovir (VALTREX) 500 MG tablet [Pharmacy Med Name: VALACYCLOVIR HCL 500 MG TAB 500 Tablet] 90 tablet 0    Sig: TAKE 1 TABLET BY MOUTH DAILY.     Antimicrobials:  Antiviral Agents - Anti-Herpetic Passed - 09/03/2019  2:14 PM      Passed - Valid encounter within last 12 months    Recent Outpatient Visits          2 months ago Allergic urticaria   [facility], Delfino Lovett, NP   3 months ago Essential (primary) hypertension   Primary Care at [facility], [place], MD   4 months ago Rash and nonspecific skin eruption   [facility], Delfino Lovett, NP   4 months ago Urticaria   [facility], Ranell Patrick, MD   4 months ago Allergic rhinitis, unspecified seasonality, unspecified trigger   [facility] at [facility], NP      Future Appointments            In 1 week Kennith Gain, MD Allergy and [place]   In 2 months Carlis Abbott, Gillermo Murdoch, PA-C [place]

## 2019-09-11 ENCOUNTER — Ambulatory Visit: Payer: 59 | Admitting: Allergy

## 2019-09-27 ENCOUNTER — Other Ambulatory Visit: Payer: Self-pay | Admitting: Physical Medicine and Rehabilitation

## 2019-09-30 ENCOUNTER — Other Ambulatory Visit: Payer: Self-pay | Admitting: Physical Medicine and Rehabilitation

## 2019-09-30 DIAGNOSIS — M5136 Other intervertebral disc degeneration, lumbar region: Secondary | ICD-10-CM

## 2019-10-10 ENCOUNTER — Other Ambulatory Visit: Payer: 59

## 2019-11-05 MED FILL — AMLODIPINE BESYLATE 10 MG T: 10 | 90 days supply | Qty: 90 | Fill #0

## 2019-11-05 MED FILL — FLUTICASONE PROP 50 MCG SPR: 50 | 30 days supply | Qty: 16 | Fill #3

## 2019-11-05 MED FILL — metFORMIN HCL 500 MG TABS: 500 | 90 days supply | Qty: 90 | Fill #0

## 2019-11-06 ENCOUNTER — Ambulatory Visit: Payer: 59 | Admitting: Physician Assistant

## 2019-11-06 MED FILL — ALBUTEROL SULFATE HFA 108 (: 108 (90 BAS | 25 days supply | Qty: 18 | Fill #0

## 2019-11-12 ENCOUNTER — Encounter (INDEPENDENT_AMBULATORY_CARE_PROVIDER_SITE_OTHER): Payer: Self-pay | Admitting: Bariatrics

## 2019-11-12 ENCOUNTER — Other Ambulatory Visit: Payer: Self-pay

## 2019-11-12 ENCOUNTER — Ambulatory Visit (INDEPENDENT_AMBULATORY_CARE_PROVIDER_SITE_OTHER): Payer: 59 | Admitting: Bariatrics

## 2019-11-12 VITALS — BP 115/78 | HR 90 | Temp 98.5°F | Ht 66.0 in | Wt 286.0 lb

## 2019-11-12 DIAGNOSIS — R7303 Prediabetes: Secondary | ICD-10-CM | POA: Diagnosis not present

## 2019-11-12 DIAGNOSIS — Z6841 Body Mass Index (BMI) 40.0 and over, adult: Secondary | ICD-10-CM

## 2019-11-12 DIAGNOSIS — I10 Essential (primary) hypertension: Secondary | ICD-10-CM | POA: Diagnosis not present

## 2019-11-12 DIAGNOSIS — E66813 Obesity, class 3: Secondary | ICD-10-CM

## 2019-11-12 MED FILL — ZOLPIDEM TARTRATE 10 MG TAB: 10 | 30 days supply | Qty: 30 | Fill #0

## 2019-11-12 NOTE — Progress Notes (Signed)
Chief Complaint:   OBESITY Michelle Huerta is here to discuss her progress with her obesity treatment plan along with follow-up of her obesity related diagnoses. Michelle Huerta is on the Category 3 Plan and states she is following her eating plan approximately 0% of the time. Michelle Huerta states she is exercising 0 minutes 0 times per week.  Today's visit was #: 35 Starting weight: 296 lbs Starting date: 05/30/2018 Today's weight: 286 lbs Today's date: 11/12/2019 Total lbs lost to date: 10 Total lbs lost since last in-office visit: 0  Interim History: Michelle Huerta is up 11 lbs since her last visit on 06/24/2019.  Subjective:   Prediabetes. Michelle Huerta has a diagnosis of prediabetes based on her elevated HgA1c and was informed this puts her at greater risk of developing diabetes. She continues to work on diet and exercise to decrease her risk of diabetes. She denies nausea or hypoglycemia. Michelle Huerta is taking metformin.  Lab Results  Component Value Date   HGBA1C 5.4 06/24/2019   Lab Results  Component Value Date   INSULIN 36.3 (H) 06/24/2019   INSULIN 37.3 (H) 02/07/2019   INSULIN 41.4 (H) 05/30/2018   Essential hypertension. Roanne is taking Norvasc.  BP Readings from Last 3 Encounters:  11/12/19 115/78  07/02/19 111/68  06/24/19 122/80   Lab Results  Component Value Date   CREATININE 0.82 06/05/2019   CREATININE 0.92 12/07/2018   CREATININE 0.86 12/02/2018   Assessment/Plan:   Prediabetes. Michelle Huerta will continue to work on weight loss, exercise, and decreasing simple carbohydrates to help decrease the risk of diabetes. She will continue her medication as directed.  Essential hypertension. Michelle Huerta is working on healthy weight loss and exercise to improve blood pressure control. We will watch for signs of hypotension as she continues her lifestyle modifications. She will continue her medication as directed.  Class 3 severe obesity with serious comorbidity and body mass index (BMI) of 45.0 to 49.9 in adult,  unspecified obesity type ([place]).  Ben is currently in the action stage of change. As such, her goal is to continue with weight loss efforts. She has agreed to the Category 3 Plan.   She will work on meal planning, intentional eating, increasing her water intake, and being more adherent to the plan.  Exercise goals: Michelle Huerta will return to the gym.  Behavioral modification strategies: increasing lean protein intake, decreasing simple carbohydrates, increasing vegetables, increasing water intake, decreasing eating out, no skipping meals, meal planning and cooking strategies, keeping healthy foods in the home and keeping a strict food journal.  Michelle Huerta has agreed to follow-up with our clinic in 2 weeks. She was informed of the importance of frequent follow-up visits to maximize her success with intensive lifestyle modifications for her multiple health conditions.   Objective:   Blood pressure 115/78, pulse 90, temperature 98.5 F (36.9 C), height 5\' 6"  (1.676 m), weight 286 lb (129.7 kg), last menstrual period 08/01/2012, SpO2 99 %. Body mass index is 46.16 kg/m.  General: Cooperative, alert, well developed, in no acute distress. HEENT: Conjunctivae and lids unremarkable. Cardiovascular: Regular rhythm.  Lungs: Normal work of breathing. Neurologic: No focal deficits.   Lab Results  Component Value Date   CREATININE 0.82 06/05/2019   BUN 16 06/05/2019   NA 142 06/05/2019   K 3.9 06/05/2019   CL 103 06/05/2019   CO2 19 (L) 06/05/2019   Lab Results  Component Value Date   ALT 38 (H) 06/05/2019   AST 30 06/05/2019   ALKPHOS 101  06/05/2019   BILITOT 0.6 06/05/2019   Lab Results  Component Value Date   HGBA1C 5.4 06/24/2019   HGBA1C 5.5 02/07/2019   HGBA1C 5.9 (A) 09/10/2018   HGBA1C 6.3 (H) 05/30/2018   HGBA1C 6.0 (A) 03/09/2018   Lab Results  Component Value Date   INSULIN 36.3 (H) 06/24/2019   INSULIN 37.3 (H) 02/07/2019   INSULIN 41.4 (H) 05/30/2018   Lab Results    Component Value Date   TSH 1.410 12/07/2018   Lab Results  Component Value Date   CHOL 134 05/30/2018   HDL 75 05/30/2018   LDLCALC 42 05/30/2018   TRIG 85 05/30/2018   CHOLHDL 1.8 05/30/2018   Lab Results  Component Value Date   WBC 5.8 12/07/2018   HGB 12.7 12/07/2018   HCT 37.5 12/07/2018   MCV 80 12/07/2018   PLT 138 (L) 12/07/2018   No results found for: IRON, TIBC, FERRITIN  Attestation Statements:   Reviewed by clinician on day of visit: allergies, medications, problem list, medical history, surgical history, family history, social history, and previous encounter notes.  Time spent on visit including pre-visit chart review and post-visit charting and care was 20 minutes.   Migdalia Dk, am acting as Location manager for [employer], DO   I have reviewed the above documentation for accuracy and completeness, and I agree with the above. Jearld Lesch, DO

## 2019-11-13 ENCOUNTER — Encounter (INDEPENDENT_AMBULATORY_CARE_PROVIDER_SITE_OTHER): Payer: Self-pay | Admitting: Bariatrics

## 2019-11-13 MED FILL — CEPHALEXIN 500 MG CAPSULE: 500 | 10 days supply | Qty: 20 | Fill #0

## 2019-11-14 MED FILL — VALACYCLOVIR HCL 500 MG TAB: 500 | 90 days supply | Qty: 90 | Fill #0

## 2019-11-19 ENCOUNTER — Other Ambulatory Visit: Payer: Self-pay

## 2019-11-19 ENCOUNTER — Ambulatory Visit (INDEPENDENT_AMBULATORY_CARE_PROVIDER_SITE_OTHER): Payer: 59 | Admitting: Cardiovascular Disease

## 2019-11-19 ENCOUNTER — Encounter: Payer: Self-pay | Admitting: Cardiovascular Disease

## 2019-11-19 VITALS — BP 118/78 | HR 78 | Ht 66.5 in | Wt 290.0 lb

## 2019-11-19 DIAGNOSIS — R0602 Shortness of breath: Secondary | ICD-10-CM

## 2019-11-19 DIAGNOSIS — G4733 Obstructive sleep apnea (adult) (pediatric): Secondary | ICD-10-CM

## 2019-11-19 DIAGNOSIS — I10 Essential (primary) hypertension: Secondary | ICD-10-CM

## 2019-11-19 DIAGNOSIS — R072 Precordial pain: Secondary | ICD-10-CM

## 2019-11-19 DIAGNOSIS — Z9989 Dependence on other enabling machines and devices: Secondary | ICD-10-CM

## 2019-11-19 MED ORDER — METOPROLOL TARTRATE 100 MG PO TABS
ORAL_TABLET | ORAL | 0 refills | Status: DC
Start: 2019-11-19 — End: 2020-06-10

## 2019-11-19 NOTE — Progress Notes (Signed)
Cardiology Office Note   Date:  11/20/2019   ID:  Michelle Huerta, DOB [birth year], MRN [id number]  PCP:  Harlan Stains, MD  Cardiologist:   Skeet Latch, MD   No chief complaint on file.   History of Present Illness: TIAH Michelle Huerta is a 59 y.o. female with hypertension, morbid obesity, Lupus, and Paget's disease who presents follow up.  She was initially seen 06/2015 for evaluation prior to starting Belviq.  At that appointment she reported symptoms concerning for OSA and was referred for a sleep study.  She was found to have OSA and started on a CPAP.  She was referred for an echo 06/2015 that revealed LVEF 55-60% with grade 1 diastolic dysfunction.   Since her last appointment [name] stopped working due to chronic pain.  She is on disability.  She struggles with chronic knee pain.  She resigned from work due it.  She is unable to get much exercise due to to this.  She denies chest pain. She is much less active since she left work.  She struggles with shortness of breath with minimal exertion.  She has no LE edema, orthopnea or PND.  She denies palpitations, lightheadedness or dizziness.  She has occasional mild edema only when eating salty foods.  She struggles with using her CPAP regularly.   She struggles with using her CPAP during allergy season.  She has sinus drainage and feels like she is choking on her CPAP.     Past Medical History:  Diagnosis Date  . Allergic urticaria 07/02/2019  . Allergy   . Arthritis of both knees   . Asthma   . Bilateral chronic knee pain   . Chronic back pain   . Constipation   . DDD (degenerative disc disease), cervical   . Depression    no meds  . Genital herpes    HSV type 2 at rectum, culture positive 3/15  . Glaucoma   . Grade I diastolic dysfunction [id number]   noted on ECHO   . Heart murmur   . History of colon polyps   . HLD (hyperlipidemia)   . Hypertension   . Hypothyroidism   . Insomnia   . Internal hemorrhoids   . Lactose  intolerance   . Lupus ([place])   . LVH (left ventricular hypertrophy) 06/25/2015   Mild, noted on ECHO   . Mild sleep apnea    1/17 CPAP Dr Claiborne Billings does not use cpap   . Morbid obesity ([place]) 06/11/2015  . Osteoporosis   . Paget's disease   . Paget's disease of bone    Dr Trudie Reed, Reclast insufion 02/14/10 - bad reaction to infusion, now tolerates fosamax weekly  . Plantar fasciitis, bilateral   . PMB (postmenopausal bleeding)   . Pre-diabetes   . Sleep apnea   . Thrombocytopenia ([place])   . TR (tricuspid regurgitation) 06/25/2015   Trace, noted on ECHO   . Tubular adenoma polyp of rectum    8/12, Dr Fuller Plan, 5 yr follow up   . Uterine fibroid 10/19/2017   noted on CT pelvis  . Vitamin D deficiency     Past Surgical History:  Procedure Laterality Date  . AUGMENTATION MAMMAPLASTY Bilateral   . BREAST ENHANCEMENT SURGERY    . BREAST REDUCTION SURGERY    . COLONOSCOPY  01/19/2016  . DILATION AND CURETTAGE OF UTERUS N/A 06/24/2015   Procedure: DILATATION AND CURETTAGE;  Surgeon: Emily Filbert, MD;  Location: [facility];  Service: Gynecology;  Laterality: N/A;  . KNEE ARTHROSCOPY WITH SUBCHONDROPLASTY Right 05/03/2018   Procedure: RIGHT KNEE ARTHROSCOPY WITH DEBRIDEMENT, PARTIAL MEDIAL MENISCECTOMY SUBCHONDROPLASTY MEDIAL TIBIAL PLATEAU AND MEDIAL FEMORAL CONDYLE;  Surgeon: Mcarthur Rossetti, MD;  Location: [facility];  Service: Orthopedics;  Laterality: Right;  . REDUCTION MAMMAPLASTY Bilateral   . TOE SURGERY     removal of bone in each foot  . TUBAL LIGATION       Current Outpatient Medications  Medication Sig Dispense Refill  . albuterol (VENTOLIN HFA) 108 (90 Base) MCG/ACT inhaler INHALE 2 PUFFS INTO THE LUNGS EVERY 6 HOURS AS NEEDED FOR WHEEZING OR SHORTNESS OF BREATH. 18 g 0  . famotidine (PEPCID) 20 MG tablet Take 1 tablet (20 mg total) by mouth 2 (two) times daily. 60 tablet 5  . fluticasone (FLONASE) 50 MCG/ACT nasal spray Place 2 sprays into both nostrils daily. 16 g 6  . ibuprofen  (ADVIL) 800 MG tablet TAKE 1 TABLET (800 MG TOTAL) BY MOUTH EVERY 8 HOURS AS NEEDED. 90 tablet 0  . MELATONIN PO Take by mouth.    . metFORMIN (GLUCOPHAGE) 500 MG tablet Take 1 tablet (500 mg total) by mouth 2 (two) times daily with a meal. 60 tablet 0  . telmisartan (MICARDIS) 80 MG tablet TAKE 1 TABLET BY MOUTH DAILY. 90 tablet 0  . traZODone (DESYREL) 50 MG tablet Take 1-2 tablets (50-100 mg total) by mouth at bedtime. 180 tablet 3  . valACYclovir (VALTREX) 500 MG tablet TAKE 1 TABLET BY MOUTH DAILY. 90 tablet 0  . zolpidem (AMBIEN) 10 MG tablet Take 10 mg by mouth at bedtime as needed for sleep.    . metoprolol tartrate (LOPRESSOR) 100 MG tablet TAKE 1 TABLET 2 HOURS PRIOR TO CT 1 tablet 0   No current facility-administered medications for this visit.    Allergies:   Penicillins and Reclast [zoledronic acid]    Social History:  The patient  reports that she quit smoking about 11 years ago. Her smoking use included cigars. She quit after 7.00 years of use. She has never used smokeless tobacco. She reports current alcohol use of about 5.0 standard drinks of alcohol per week. She reports that she does not use drugs.   Family History:  The patient's family history includes Allergic rhinitis in her mother; Cancer in her maternal grandmother; Heart disease in her mother; Hypertension in her father, mother, sister, son, and son; Stomach cancer in her paternal grandmother.    ROS:  Please see the history of present illness.   Otherwise, review of systems are positive for none.   All other systems are reviewed and negative.    PHYSICAL EXAM: VS:  BP 118/78   Pulse 78   Ht 5' 6.5" (1.689 m)   Wt 290 lb (131.5 kg)   LMP 08/01/2012   BMI 46.11 kg/m  , BMI Body mass index is 46.11 kg/m. GENERAL:  Well appearing.  No acute distress.  HEENT:  Pupils equal round and reactive, fundi not visualized, oral mucosa unremarkable NECK:  No jugular venous distention, waveform within normal limits,  carotid upstroke brisk and symmetric, no bruits LUNGS:  Clear to auscultation bilaterally.  No crackles, wheezes or rhonci HEART:  RRR.  PMI not displaced or sustained,S1 and S2 within normal limits, no S3, no S4, no clicks, no rubs, no murmurs ABD:  Flat, positive bowel sounds normal in frequency in pitch, no bruits, no rebound, no guarding, no midline pulsatile mass, no hepatomegaly, no splenomegaly.  Obese EXT:  2 plus pulses throughout, no edema, no cyanosis no clubbing SKIN:  No rashes no nodules NEURO:  Cranial nerves II through XII grossly intact, motor grossly intact throughout PSYCH:  Cognitively intact, oriented to person place and time   EKG:  EKG is ordered today. The ekg ordered 06/11/15 demonstrates sinus rhythm rate 83 bpm. Early R wave progression. 01/16/17: Sinus rhythm. Rate 75 bpm. 11/19/19: Sinus rhythm.  Rate 78 bpm  Echo 06/25/15: Study Conclusions  - Left ventricle: The cavity size was normal. Wall thickness was   increased in a pattern of mild LVH. Systolic function was normal.   The estimated ejection fraction was in the range of 55% to 60%.   Regional wall motion abnormalities cannot be excluded. Doppler   parameters are consistent with abnormal left ventricular   relaxation (grade 1 diastolic dysfunction).  Impressions:  - Technically difficult; normal LV systolic function; grade 1   diastolic dysfunction; mild LVH; trace TR.  Recent Labs: 12/07/2018: Hemoglobin 12.7; Platelets 138; TSH 1.410 06/05/2019: ALT 38; BUN 16; Creatinine, Ser 0.82; Potassium 3.9; Sodium 142    Lipid Panel    Component Value Date/Time   CHOL 134 05/30/2018 1116   TRIG 85 05/30/2018 1116   HDL 75 05/30/2018 1116   CHOLHDL 1.8 05/30/2018 1116   LDLCALC 42 05/30/2018 1116   03/25/15: WBC 5.5, hematocrit 36.7, platelets 138 Sodium 141, potassium 3.7, BUN 15, creatinine 0.79 AST 17 ALT 18 TSH 1.33 Total cholesterol 150, triglycerides 95, HDL 70, LDL61  Wt Readings from  Last 3 Encounters:  11/19/19 290 lb (131.5 kg)  11/12/19 286 lb (129.7 kg)  08/07/19 286 lb (129.7 kg)      ASSESSMENT AND PLAN:  # Shortness of breath:  # Atypical chest pain:  Symptoms are atypical.  I suspect it is attributable to obestiy and deconditioning.  We will get a coronayry CT-A to evaluate for obstructive coronary disease.  # Morbid obesity: Continue working with healthy weight and wellness clinic.  Increase exerice to 150 minutes weekly.  # OSA: Continue CPAP.  Recommend treatment for seasonal allergies to prevent post nasal drip.  # Hypertension: Blood pressure is well-controlled on metoprolol and telmisartan.   Current medicines are reviewed at length with the patient today.  The patient does not have concerns regarding medicines.  The following changes have been made:  no change  Labs/ tests ordered today include:   Orders Placed This Encounter  Procedures  . CT CORONARY MORPH W/CTA COR W/SCORE W/CA W/CM &/OR WO/CM  . CT CORONARY FRACTIONAL FLOW RESERVE DATA PREP  . CT CORONARY FRACTIONAL FLOW RESERVE FLUID ANALYSIS  . Basic metabolic panel  . EKG 12-Lead     Disposition:   FU with Titiana Severa C. Oval Linsey, MD, [facility] in 1 year.   Signed, Anijah Spohr C. Oval Linsey, [place], [facility]  11/20/2019 8:17 AM    [place]

## 2019-11-19 NOTE — Patient Instructions (Signed)
Medication Instructions:  TAKE METOPROLOL 100 MG 2 HOURS PRIOR TO CARDIAC CT  *If you need a refill on your cardiac medications before your next appointment, please call your pharmacy*  Lab Work: BMET 1 WEEK PRIOR TO CT   If you have labs (blood work) drawn today and your tests are completely normal, you will receive your results only by:  [place] (if you have MyChart) OR  A paper copy in the mail If you have any lab test that is abnormal or we need to change your treatment, we will call you to review the results.  Testing/Procedures: Your physician has requested that you have cardiac CT. Cardiac computed tomography (CT) is a painless test that uses an x-ray machine to take clear, detailed pictures of your heart. For further information please visit [email or web address].[email or web address]. Please follow instruction sheet as given.  ONCE YOUR INSURANCE HAS APPROVED THE OFFICE WILL CALL YOU TO ARRANGE. IF YOU HAVE NOT HEARD FROM THE OFFICE IN 2 WEEKS PLEASE CALL TO FOLLOW UP   Follow-Up: At [facility], you and your health needs are our priority.  As part of our continuing mission to provide you with exceptional heart care, we have created designated Provider Care Teams.  These Care Teams include your primary Cardiologist (physician) and Advanced Practice Providers (APPs -  Physician Assistants and Nurse Practitioners) who all work together to provide you with the care you need, when you need it.  We recommend signing up for the patient portal called "MyChart".  Sign up information is provided on this After Visit Summary.  MyChart is used to connect with patients for Virtual Visits (Telemedicine).  Patients are able to view lab/test results, encounter notes, upcoming appointments, etc.  Non-urgent messages can be sent to your provider as well.   To learn more about what you can do with MyChart, go to [email or web address].    Your next appointment:   12 month(s) You will receive a reminder letter  in the mail two months in advance. If you don't receive a letter, please call our office to schedule the follow-up appointment.   The format for your next appointment:   In Person  Provider:   You may see DR [facility]  or one of the following Advanced Practice Providers on your designated Care Team:    Kerin Ransom, PA-C  [place], [place]  Coletta Memos, [place]  Other Instructions  Your cardiac CT will be scheduled at one of the below locations:   [facility] [address] [place], [place] [address] [phone]  [place] [address] [place], [place] [address] [phone]  If scheduled at [facility], please arrive at the [facility] main entrance of [facility] 30 minutes prior to test start time. Proceed to the [facility] Radiology Department (first floor) to check-in and test prep.  If scheduled at [facility], please arrive 15 mins early for check-in and test prep.  Please follow these instructions carefully (unless otherwise directed):  Hold all erectile dysfunction medications at least 3 days (72 hrs) prior to test.  On the Night Before the Test:  Be sure to Drink plenty of water.  Do not consume any caffeinated/decaffeinated beverages or chocolate 12 hours prior to your test.  Do not take any antihistamines 12 hours prior to your test.  If you take Metformin do not take 24 hours prior to test.  If the patient has contrast allergy: ?  Patient will need a prescription for Prednisone and very clear instructions (as follows): 1. Prednisone 50 mg - take 13 hours prior to test 2. Take another Prednisone 50 mg 7 hours prior to test 3. Take another Prednisone 50 mg 1 hour prior to test 4. Take Benadryl 50 mg 1 hour prior to test  Patient must complete all four doses of above prophylactic medications.  Patient will need a ride after test due to  Benadryl.  On the Day of the Test:  Drink plenty of water. Do not drink any water within one hour of the test.  Do not eat any food 4 hours prior to the test.  You may take your regular medications prior to the test.   Take metoprolol (Lopressor) two hours prior to test.  HOLD Furosemide/Hydrochlorothiazide morning of the test.  FEMALES- please wear underwire-free bra if available      After the Test:  Drink plenty of water.  After receiving IV contrast, you may experience a mild flushed feeling. This is normal.  On occasion, you may experience a mild rash up to 24 hours after the test. This is not dangerous. If this occurs, you can take Benadryl 25 mg and increase your fluid intake.  If you experience trouble breathing, this can be serious. If it is severe call 911 IMMEDIATELY. If it is mild, please call our office.  If you take any of these medications: Glipizide/Metformin, Avandament, Glucavance, please do not take 48 hours after completing test unless otherwise instructed.   Once we have confirmed authorization from your insurance company, we will call you to set up a date and time for your test.   For non-scheduling related questions, please contact the cardiac imaging nurse navigator should you have any questions/concerns: Marchia Bond, RN Navigator Cardiac Imaging [facility] [phone] office  For scheduling needs, including cancellations and rescheduling, please call [phone].   Cardiac CT Angiogram A cardiac CT angiogram is a procedure to look at the heart and the area around the heart. It may be done to help find the cause of chest pains or other symptoms of heart disease. During this procedure, a substance called contrast dye is injected into the blood vessels in the area to be checked. A large X-ray machine, called a CT scanner, then takes detailed pictures of the heart and the surrounding area. The procedure is also sometimes  called a coronary CT angiogram, coronary artery scanning, or CTA. A cardiac CT angiogram allows the health care provider to see how well blood is flowing to and from the heart. The health care provider will be able to see if there are any problems, such as:  Blockage or narrowing of the coronary arteries in the heart.  Fluid around the heart.  Signs of weakness or disease in the muscles, valves, and tissues of the heart. Tell a health care provider about:  Any allergies you have. This is especially important if you have had a previous allergic reaction to contrast dye.  All medicines you are taking, including vitamins, herbs, eye drops, creams, and over-the-counter medicines.  Any blood disorders you have.  Any surgeries you have had.  Any medical conditions you have.  Whether you are pregnant or may be pregnant.  Any anxiety disorders, chronic pain, or other conditions you have that may increase your stress or prevent you from lying still. What are the risks? Generally, this is a safe procedure. However, problems may occur, including:  Bleeding.  Infection.  Allergic reactions to medicines or dyes.  Damage to other structures or organs.  Kidney damage from the contrast dye that is used.  Increased risk of cancer from radiation exposure. This risk is low. Talk with your health care provider about: ? The risks and benefits of testing. ? How you can receive the lowest dose of radiation. What happens before the procedure?  Wear comfortable clothing and remove any jewelry, glasses, dentures, and hearing aids.  Follow instructions from your health care provider about eating and drinking. This may include: ? For 12 hours before the procedure -- avoid caffeine. This includes tea, coffee, soda, energy drinks, and diet pills. Drink plenty of water or other fluids that do not have caffeine in them. Being well hydrated can prevent complications. ? For 4-6 hours before the procedure  -- stop eating and drinking. The contrast dye can cause nausea, but this is less likely if your stomach is empty.  Ask your health care provider about changing or stopping your regular medicines. This is especially important if you are taking diabetes medicines, blood thinners, or medicines to treat problems with erections (erectile dysfunction). What happens during the procedure?   Hair on your chest may need to be removed so that small sticky patches called electrodes can be placed on your chest. These will transmit information that helps to monitor your heart during the procedure.  An IV will be inserted into one of your veins.  You might be given a medicine to control your heart rate during the procedure. This will help to ensure that good images are obtained.  You will be asked to lie on an exam table. This table will slide in and out of the CT machine during the procedure.  Contrast dye will be injected into the IV. You might feel warm, or you may get a metallic taste in your mouth.  You will be given a medicine called nitroglycerin. This will relax or dilate the arteries in your heart.  The table that you are lying on will move into the CT machine tunnel for the scan.  The person running the machine will give you instructions while the scans are being done. You may be asked to: ? Keep your arms above your head. ? Hold your breath. ? Stay very still, even if the table is moving.  When the scanning is complete, you will be moved out of the machine.  The IV will be removed. The procedure may vary among health care providers and hospitals. What can I expect after the procedure? After your procedure, it is common to have:  A metallic taste in your mouth from the contrast dye.  A feeling of warmth.  A headache from the nitroglycerin. Follow these instructions at home:  Take over-the-counter and prescription medicines only as told by your health care provider.  If you are  told, drink enough fluid to keep your urine pale yellow. This will help to flush the contrast dye out of your body.  Most people can return to their normal activities right after the procedure. Ask your health care provider what activities are safe for you.  It is up to you to get the results of your procedure. Ask your health care provider, or the department that is doing the procedure, when your results will be ready.  Keep all follow-up visits as told by your health care provider. This is important. Contact a health care provider if:  You have any symptoms of allergy to  the contrast dye. These include: ? Shortness of breath. ? Rash or hives. ? A racing heartbeat. Summary  A cardiac CT angiogram is a procedure to look at the heart and the area around the heart. It may be done to help find the cause of chest pains or other symptoms of heart disease.  During this procedure, a large X-ray machine, called a CT scanner, takes detailed pictures of the heart and the surrounding area after a contrast dye has been injected into blood vessels in the area.  Ask your health care provider about changing or stopping your regular medicines before the procedure. This is especially important if you are taking diabetes medicines, blood thinners, or medicines to treat erectile dysfunction.  If you are told, drink enough fluid to keep your urine pale yellow. This will help to flush the contrast dye out of your body. This information is not intended to replace advice given to you by your health care provider. Make sure you discuss any questions you have with your health care provider. Document Revised: 02/20/2019 Document Reviewed: 02/20/2019 Elsevier Patient Education  [place].

## 2019-11-20 ENCOUNTER — Encounter: Payer: Self-pay | Admitting: Cardiovascular Disease

## 2019-12-02 ENCOUNTER — Ambulatory Visit (INDEPENDENT_AMBULATORY_CARE_PROVIDER_SITE_OTHER): Payer: 59 | Admitting: Bariatrics

## 2019-12-04 MED FILL — TELMISARTAN 80 MG TABLET: 80 | 90 days supply | Qty: 90 | Fill #1

## 2019-12-04 MED FILL — FLUTICASONE PROP 50 MCG SPR: 50 | 30 days supply | Qty: 16 | Fill #4

## 2019-12-10 ENCOUNTER — Other Ambulatory Visit: Payer: Self-pay

## 2019-12-10 ENCOUNTER — Encounter (INDEPENDENT_AMBULATORY_CARE_PROVIDER_SITE_OTHER): Payer: Self-pay | Admitting: Bariatrics

## 2019-12-10 ENCOUNTER — Ambulatory Visit (INDEPENDENT_AMBULATORY_CARE_PROVIDER_SITE_OTHER): Payer: 59 | Admitting: Bariatrics

## 2019-12-10 VITALS — BP 132/83 | HR 76 | Temp 97.9°F | Ht 66.0 in | Wt 290.0 lb

## 2019-12-10 DIAGNOSIS — M1711 Unilateral primary osteoarthritis, right knee: Secondary | ICD-10-CM

## 2019-12-10 DIAGNOSIS — I10 Essential (primary) hypertension: Secondary | ICD-10-CM

## 2019-12-10 DIAGNOSIS — Z6841 Body Mass Index (BMI) 40.0 and over, adult: Secondary | ICD-10-CM

## 2019-12-10 DIAGNOSIS — Z9189 Other specified personal risk factors, not elsewhere classified: Secondary | ICD-10-CM | POA: Diagnosis not present

## 2019-12-10 DIAGNOSIS — E8881 Metabolic syndrome: Secondary | ICD-10-CM | POA: Diagnosis not present

## 2019-12-10 NOTE — Progress Notes (Signed)
Chief Complaint:   OBESITY Michelle Huerta is here to discuss her progress with her obesity treatment plan along with follow-up of her obesity related diagnoses. Michelle Huerta is on the Category 3 Plan and states she is following her eating plan approximately 20% of the time. Michelle Huerta states she is exercising 0 minutes 0 times per week.  Today's visit was #: 20 Starting weight: 296 lbs Starting date: 05/30/2018 Today's weight: 290 lbs Today's date: 12/10/2019 Total lbs lost to date: 6 Total lbs lost since last in-office visit: 0  Interim History: Michelle Huerta is up 4 lbs. She has been eating more chips and drinking soda.  Subjective:   Osteoarthritis of right knee, unspecified osteoarthritis type. Michelle Huerta states right knee continues to hurt intermittently.  Essential hypertension. Michelle Huerta is taking Lopressor. Blood pressure is reasonably well controlled.  BP Readings from Last 3 Encounters:  11/19/19 118/78  11/12/19 115/78  07/02/19 111/68   Lab Results  Component Value Date   CREATININE 0.82 06/05/2019   CREATININE 0.92 12/07/2018   CREATININE 0.86 12/02/2018   Insulin resistance. Michelle Huerta has a diagnosis of insulin resistance based on her elevated fasting insulin level >5. She continues to work on diet and exercise to decrease her risk of diabetes.  Lab Results  Component Value Date   INSULIN 36.3 (H) 06/24/2019   INSULIN 37.3 (H) 02/07/2019   INSULIN 41.4 (H) 05/30/2018   Lab Results  Component Value Date   HGBA1C 5.4 06/24/2019   At risk for activity intolerance. Michelle Huerta is at risk of exercise intolerance due to obesity.  Assessment/Plan:   Osteoarthritis of right knee, unspecified osteoarthritis type. Tarena will walk for exercise and will avoid pounding exercises. VITAMIN D 25 Hydroxy (Vit-D Deficiency, Fractures) labs will be drawn today.  Essential hypertension. Michelle Huerta is working on healthy weight loss and exercise to improve blood pressure control. We will watch for signs of hypotension as  she continues her lifestyle modifications. She will continue her medication as directed. Comprehensive metabolic panel, Lipid Panel With LDL/HDL Ratio, T3, T4, free, TSH labs will be drawn today.  Insulin resistance. Michelle Huerta will continue to work on weight loss, exercise, and decreasing simple carbohydrates to help decrease the risk of diabetes. Michelle Huerta agreed to follow-up with [place] as directed to closely monitor her progress. Hemoglobin A1c, Insulin, random labs will be drawn today.  At risk for activity intolerance. Michelle Huerta was given approximately 15 minutes of exercise intolerance counseling today. She is 59 y.o. female and has risk factors exercise intolerance including obesity. We discussed intensive lifestyle modifications today with an emphasis on specific weight loss instructions and strategies. Michelle Huerta will slowly increase activity as tolerated.  Repetitive spaced learning was employed today to elicit superior memory formation and behavioral change.  Class 3 severe obesity with serious comorbidity and body mass index (BMI) of 45.0 to 49.9 in adult, unspecified obesity type ([place]).   Michelle Huerta is currently in the action stage of change. As such, her goal is to continue with weight loss efforts. She has agreed to the Category 3 Plan.   She will work on meal planning, intentional eating, avoiding chips or soda, increasing her water intake, and being more adherent to the plan.  Exercise goals: Shawnell will go to the [facility] to walk the track.  Behavioral modification strategies: increasing lean protein intake, decreasing simple carbohydrates, increasing vegetables, increasing water intake, decreasing eating out, no skipping meals, meal planning and cooking strategies, keeping healthy foods in the home and planning for success.  Michelle Huerta has agreed to follow-up with our clinic in 2 weeks. She was informed of the importance of frequent follow-up visits to maximize her success with intensive lifestyle modifications for her  multiple health conditions.   Michelle Huerta was informed we would discuss her lab results at her next visit unless there is a critical issue that needs to be addressed sooner. Michelle Huerta agreed to keep her next visit at the agreed upon time to discuss these results.  Objective:   Pulse 76, temperature 97.9 F (36.6 C), height 5\' 6"  (1.676 m), weight 290 lb (131.5 kg), last menstrual period 08/01/2012, SpO2 100 %. Body mass index is 46.81 kg/m.  General: Cooperative, alert, well developed, in no acute distress. HEENT: Conjunctivae and lids unremarkable. Cardiovascular: Regular rhythm.  Lungs: Normal work of breathing. Neurologic: No focal deficits.   Lab Results  Component Value Date   CREATININE 0.82 06/05/2019   BUN 16 06/05/2019   NA 142 06/05/2019   K 3.9 06/05/2019   CL 103 06/05/2019   CO2 19 (L) 06/05/2019   Lab Results  Component Value Date   ALT 38 (H) 06/05/2019   AST 30 06/05/2019   ALKPHOS 101 06/05/2019   BILITOT 0.6 06/05/2019   Lab Results  Component Value Date   HGBA1C 5.4 06/24/2019   HGBA1C 5.5 02/07/2019   HGBA1C 5.9 (A) 09/10/2018   HGBA1C 6.3 (H) 05/30/2018   HGBA1C 6.0 (A) 03/09/2018   Lab Results  Component Value Date   INSULIN 36.3 (H) 06/24/2019   INSULIN 37.3 (H) 02/07/2019   INSULIN 41.4 (H) 05/30/2018   Lab Results  Component Value Date   TSH 1.410 12/07/2018   Lab Results  Component Value Date   CHOL 134 05/30/2018   HDL 75 05/30/2018   LDLCALC 42 05/30/2018   TRIG 85 05/30/2018   CHOLHDL 1.8 05/30/2018   Lab Results  Component Value Date   WBC 5.8 12/07/2018   HGB 12.7 12/07/2018   HCT 37.5 12/07/2018   MCV 80 12/07/2018   PLT 138 (L) 12/07/2018   No results found for: IRON, TIBC, FERRITIN  Attestation Statements:   Reviewed by clinician on day of visit: allergies, medications, problem list, medical history, surgical history, family history, social history, and previous encounter notes.  Migdalia Dk, am acting as  Location manager for [employer], DO   I have reviewed the above documentation for accuracy and completeness, and I agree with the above. Jearld Lesch, DO

## 2019-12-11 LAB — LIPID PANEL WITH LDL/HDL RATIO
Cholesterol, Total: 151 mg/dL (ref 100–199)
HDL: 66 mg/dL (ref >39)
LDL Chol Calc (NIH): 67 mg/dL (ref 0–99)
LDL/HDL Ratio: 1 ratio (ref 0.0–3.2)
Triglycerides: 98 mg/dL (ref 0–149)
VLDL Cholesterol Cal: 18 mg/dL (ref 5–40)

## 2019-12-11 LAB — COMPREHENSIVE METABOLIC PANEL
ALT: 23 IU/L (ref 0–32)
AST: 20 IU/L (ref 0–40)
Albumin/Globulin Ratio: 1 — ABNORMAL LOW (ref 1.2–2.2)
Albumin: 4.1 g/dL (ref 3.8–4.9)
Alkaline Phosphatase: 116 IU/L (ref 48–121)
BUN/Creatinine Ratio: 25 — ABNORMAL HIGH (ref 9–23)
BUN: 18 mg/dL (ref 6–24)
Bilirubin Total: 0.2 mg/dL (ref 0.0–1.2)
CO2: 21 mmol/L (ref 20–29)
Calcium: 9.4 mg/dL (ref 8.7–10.2)
Chloride: 107 mmol/L — ABNORMAL HIGH (ref 96–106)
Creatinine, Ser: 0.73 mg/dL (ref 0.57–1.00)
GFR calc Af Amer: 106 mL/min/{1.73_m2} (ref >59)
GFR calc non Af Amer: 92 mL/min/{1.73_m2} (ref >59)
Globulin, Total: 4 g/dL (ref 1.5–4.5)
Glucose: 92 mg/dL (ref 65–99)
Potassium: 4 mmol/L (ref 3.5–5.2)
Sodium: 141 mmol/L (ref 134–144)
Total Protein: 8.1 g/dL (ref 6.0–8.5)

## 2019-12-11 LAB — HEMOGLOBIN A1C
Est. average glucose Bld gHb Est-mCnc: 120 mg/dL
Hgb A1c MFr Bld: 5.8 % — ABNORMAL HIGH (ref 4.8–5.6)

## 2019-12-11 LAB — T3: T3, Total: 119 ng/dL (ref 71–180)

## 2019-12-11 LAB — VITAMIN D 25 HYDROXY (VIT D DEFICIENCY, FRACTURES): Vit D, 25-Hydroxy: 41.3 ng/mL (ref 30.0–100.0)

## 2019-12-11 LAB — INSULIN, RANDOM: INSULIN: 44.4 u[IU]/mL — ABNORMAL HIGH (ref 2.6–24.9)

## 2019-12-11 LAB — T4, FREE: Free T4: 1.22 ng/dL (ref 0.82–1.77)

## 2019-12-11 LAB — TSH: TSH: 2.63 u[IU]/mL (ref 0.450–4.500)

## 2019-12-17 ENCOUNTER — Telehealth: Payer: Self-pay | Admitting: Cardiovascular Disease

## 2019-12-17 NOTE — Telephone Encounter (Signed)
Patient returned call to schedule CT. Call transferred to [employer].

## 2019-12-17 NOTE — Telephone Encounter (Signed)
Will forward message to [place] and prior auth re sscheduling test .Adonis Housekeeper

## 2019-12-23 ENCOUNTER — Telehealth (HOSPITAL_COMMUNITY): Payer: Self-pay | Admitting: *Deleted

## 2019-12-23 NOTE — Telephone Encounter (Signed)
Attempted to call patient regarding upcoming cardiac CT appointment. °Left message on voicemail with name and callback number ° °Shealee Yordy Tai RN Navigator Cardiac Imaging °[facility] °[phone] Office °[phone] Cell ° °

## 2019-12-23 NOTE — Telephone Encounter (Signed)
Pt returning call regarding upcoming cardiac imaging study; pt verbalizes understanding of appt date/time, parking situation and where to check in, pre-test NPO status and medications ordered, and verified current allergies; name and call back number provided for further questions should they arise  Iwalani Templeton Tai RN Navigator Cardiac Imaging [facility] [phone] office [phone] cell  

## 2019-12-24 ENCOUNTER — Ambulatory Visit (HOSPITAL_COMMUNITY)
Admission: RE | Admit: 2019-12-24 | Discharge: 2019-12-24 | Disposition: A | Discharge status: Home / Self Care | Payer: 59 | Source: Ambulatory Visit | Attending: Cardiovascular Disease | Admitting: Cardiovascular Disease

## 2019-12-24 ENCOUNTER — Other Ambulatory Visit: Payer: Self-pay

## 2019-12-24 ENCOUNTER — Encounter (HOSPITAL_COMMUNITY): Payer: Self-pay

## 2019-12-24 DIAGNOSIS — I10 Essential (primary) hypertension: Secondary | ICD-10-CM

## 2019-12-24 DIAGNOSIS — R072 Precordial pain: Secondary | ICD-10-CM

## 2019-12-24 DIAGNOSIS — R0602 Shortness of breath: Secondary | ICD-10-CM

## 2019-12-26 ENCOUNTER — Other Ambulatory Visit (HOSPITAL_COMMUNITY): Payer: Self-pay | Admitting: Rheumatology

## 2019-12-26 MED FILL — IBUPROFEN 800 MG TABS: 800 | 30 days supply | Qty: 60 | Fill #0

## 2019-12-26 MED FILL — VIT D2 1.25 MG (50,000 UNIT: 1.25 MG | 84 days supply | Qty: 12 | Fill #1

## 2019-12-31 ENCOUNTER — Telehealth: Payer: Self-pay | Admitting: Cardiovascular Disease

## 2019-12-31 DIAGNOSIS — I10 Essential (primary) hypertension: Secondary | ICD-10-CM

## 2019-12-31 DIAGNOSIS — R0602 Shortness of breath: Secondary | ICD-10-CM

## 2019-12-31 DIAGNOSIS — R072 Precordial pain: Secondary | ICD-10-CM

## 2019-12-31 NOTE — Telephone Encounter (Signed)
Advised patient, verbalized understanding Order placed and message sent to scheduling

## 2019-12-31 NOTE — Telephone Encounter (Signed)
-----  Message from Skeet Latch, MD sent at 12/25/2019  8:32 AM EDT ----- OK thank you.  Rip Harbour, can we please get her a Lexicographer at [facility]. Instead?  Thanks! ----- Message ----- From: Zeb Comfort, RN Sent: 12/25/2019   8:08 AM EDT To: Skeet Latch, MD  Hi Dr. Oval Linsey,  Just letting you know we couldn't do this pt's CTA because her heart rhythm was irregular when we attempted on June 15.  [place]

## 2019-12-31 NOTE — Telephone Encounter (Signed)
Patient is called stating she couldn't have her CT done because of an irregular heartbeat.  She wants to know what is the next step Dr. Oval Linsey has planned for her. Please advise.

## 2020-01-01 ENCOUNTER — Encounter (HOSPITAL_COMMUNITY): Payer: Self-pay | Admitting: *Deleted

## 2020-01-01 ENCOUNTER — Encounter: Payer: Self-pay | Admitting: Cardiovascular Disease

## 2020-01-01 ENCOUNTER — Telehealth (HOSPITAL_COMMUNITY): Payer: Self-pay | Admitting: *Deleted

## 2020-01-01 NOTE — Telephone Encounter (Signed)
Spoke with patient regarding appointment for a 2 day Lexiscan myoview ordered by Dr. Donivan Scull Monday 01/06/20 and Tuesday 01/07/20 at 1:15 pm at our [facility] locations --[address], [address].  Patient voiced her understanding and I will mail instructions and appointment information to her.

## 2020-01-01 NOTE — Telephone Encounter (Signed)
Left message on voicemail per DPR in reference to upcoming appointment scheduled on 01/06/2020 at 115pm with detailed instructions given per Myocardial Perfusion Study Information Sheet for the test. LM to arrive 15 minutes early, and that it is imperative to arrive on time for appointment to keep from having the test rescheduled. If you need to cancel or reschedule your appointment, please call the office within 24 hours of your appointment. Failure to do so may result in a cancellation of your appointment, and a $50 no show fee. Phone number given for call back for any questions. Mychart letter sent with instructions.Michelle Huerta, Michelle Huerta

## 2020-01-02 ENCOUNTER — Encounter (INDEPENDENT_AMBULATORY_CARE_PROVIDER_SITE_OTHER): Payer: Self-pay | Admitting: Bariatrics

## 2020-01-02 ENCOUNTER — Ambulatory Visit (INDEPENDENT_AMBULATORY_CARE_PROVIDER_SITE_OTHER): Payer: 59 | Admitting: Bariatrics

## 2020-01-02 ENCOUNTER — Other Ambulatory Visit: Payer: Self-pay

## 2020-01-02 VITALS — BP 129/87 | HR 79 | Temp 97.9°F | Ht 66.0 in | Wt 291.0 lb

## 2020-01-02 DIAGNOSIS — I10 Essential (primary) hypertension: Secondary | ICD-10-CM

## 2020-01-02 DIAGNOSIS — R7303 Prediabetes: Secondary | ICD-10-CM | POA: Diagnosis not present

## 2020-01-02 DIAGNOSIS — Z6841 Body Mass Index (BMI) 40.0 and over, adult: Secondary | ICD-10-CM

## 2020-01-02 NOTE — Progress Notes (Signed)
Chief Complaint:   OBESITY Michelle Huerta is here to discuss her progress with her obesity treatment plan along with follow-up of her obesity related diagnoses. Michelle Huerta is on the Category 3 Plan and states she is following her eating plan approximately 30% of the time. Michelle Huerta states she is exercising 0 minutes 0 times per week.  Today's visit was #: 21 Starting weight: 296 lbs Starting date: 05/30/2018 Today's weight: 291 lbs Today's date: 01/02/2020 Total lbs lost to date: 5 Total lbs lost since last in-office visit: 0  Interim History: Michelle Huerta is up 1 lb. She needs to do better with her water intake.  Subjective:   Prediabetes. Michelle Huerta has a diagnosis of prediabetes based on her elevated HgA1c and was informed this puts her at greater risk of developing diabetes. She continues to work on diet and exercise to decrease her risk of diabetes. She denies nausea or hypoglycemia. Michelle Huerta is taking metformin.  Lab Results  Component Value Date   HGBA1C 5.8 (H) 12/10/2019   Lab Results  Component Value Date   INSULIN 44.4 (H) 12/10/2019   INSULIN 36.3 (H) 06/24/2019   INSULIN 37.3 (H) 02/07/2019   INSULIN 41.4 (H) 05/30/2018   Essential hypertension. Michelle Huerta is taking Lopressor and Micardis. Blood pressure is reasonably well controlled.  BP Readings from Last 3 Encounters:  01/02/20 129/87  12/10/19 132/83  11/19/19 118/78   Lab Results  Component Value Date   CREATININE 0.73 12/10/2019   CREATININE 0.82 06/05/2019   CREATININE 0.92 12/07/2018   Assessment/Plan:   Prediabetes. Michelle Huerta will continue to work on weight loss, exercise, increasing healthy fats and protein, and decreasing simple carbohydrates to help decrease the risk of diabetes. She will continue metformin as directed.  Essential hypertension. Michelle Huerta is working on healthy weight loss and exercise to improve blood pressure control. We will watch for signs of hypotension as she continues her lifestyle modifications. She will  continue her medications as directed.  Class 3 severe obesity with serious comorbidity and body mass index (BMI) of 45.0 to 49.9 in adult, unspecified obesity type ([place]).  Michelle Huerta is currently in the action stage of change. As such, her goal is to continue with weight loss efforts. She has agreed to the Category 3 Plan.   She will work on meal planning, intentional eating, and increasing her water intake. She will pack her lunch and snacks.  Exercise goals: All adults should avoid inactivity. Some physical activity is better than none, and adults who participate in any amount of physical activity gain some health benefits.  Behavioral modification strategies: increasing lean protein intake, decreasing simple carbohydrates, increasing vegetables, increasing water intake, decreasing eating out, no skipping meals, meal planning and cooking strategies, keeping healthy foods in the home and planning for success.  Michelle Huerta has agreed to follow-up with our clinic in 2 weeks. She was informed of the importance of frequent follow-up visits to maximize her success with intensive lifestyle modifications for her multiple health conditions.   Objective:   Blood pressure 129/87, pulse 79, temperature 97.9 F (36.6 C), height 5\' 6"  (1.676 m), weight 291 lb (132 kg), last menstrual period 08/01/2012, SpO2 100 %. Body mass index is 46.97 kg/m.  General: Cooperative, alert, well developed, in no acute distress. HEENT: Conjunctivae and lids unremarkable. Cardiovascular: Regular rhythm.  Lungs: Normal work of breathing. Neurologic: No focal deficits.   Lab Results  Component Value Date   CREATININE 0.73 12/10/2019   BUN 18 12/10/2019   NA  141 12/10/2019   K 4.0 12/10/2019   CL 107 (H) 12/10/2019   CO2 21 12/10/2019   Lab Results  Component Value Date   ALT 23 12/10/2019   AST 20 12/10/2019   ALKPHOS 116 12/10/2019   BILITOT 0.2 12/10/2019   Lab Results  Component Value Date   HGBA1C 5.8 (H)  12/10/2019   HGBA1C 5.4 06/24/2019   HGBA1C 5.5 02/07/2019   HGBA1C 5.9 (A) 09/10/2018   HGBA1C 6.3 (H) 05/30/2018   Lab Results  Component Value Date   INSULIN 44.4 (H) 12/10/2019   INSULIN 36.3 (H) 06/24/2019   INSULIN 37.3 (H) 02/07/2019   INSULIN 41.4 (H) 05/30/2018   Lab Results  Component Value Date   TSH 2.630 12/10/2019   Lab Results  Component Value Date   CHOL 151 12/10/2019   HDL 66 12/10/2019   LDLCALC 67 12/10/2019   TRIG 98 12/10/2019   CHOLHDL 1.8 05/30/2018   Lab Results  Component Value Date   WBC 5.8 12/07/2018   HGB 12.7 12/07/2018   HCT 37.5 12/07/2018   MCV 80 12/07/2018   PLT 138 (L) 12/07/2018   No results found for: IRON, TIBC, FERRITIN  Attestation Statements:   Reviewed by clinician on day of visit: allergies, medications, problem list, medical history, surgical history, family history, social history, and previous encounter notes.  Time spent on visit including pre-visit chart review and post-visit charting and care was 20 minutes.   Migdalia Dk, am acting as Location manager for [employer], DO   I have reviewed the above documentation for accuracy and completeness, and I agree with the above. Jearld Lesch, DO

## 2020-01-06 ENCOUNTER — Other Ambulatory Visit: Payer: Self-pay

## 2020-01-06 ENCOUNTER — Ambulatory Visit (HOSPITAL_COMMUNITY): Payer: 59 | Attending: Cardiovascular Disease

## 2020-01-06 DIAGNOSIS — R072 Precordial pain: Secondary | ICD-10-CM

## 2020-01-06 DIAGNOSIS — R0602 Shortness of breath: Secondary | ICD-10-CM | POA: Diagnosis not present

## 2020-01-06 DIAGNOSIS — I10 Essential (primary) hypertension: Secondary | ICD-10-CM

## 2020-01-06 MED ORDER — TECHNETIUM TC 99M TETROFOSMIN IV KIT
32.7000 | PACK | Freq: Once | INTRAVENOUS | Status: AC | PRN
  Administered 2020-01-06: 32.7 via INTRAVENOUS
  Filled 2020-01-06: qty 33

## 2020-01-07 ENCOUNTER — Ambulatory Visit (HOSPITAL_COMMUNITY): Payer: 59 | Attending: Cardiovascular Disease

## 2020-01-07 ENCOUNTER — Other Ambulatory Visit: Payer: Self-pay

## 2020-01-07 DIAGNOSIS — R0602 Shortness of breath: Secondary | ICD-10-CM | POA: Diagnosis present

## 2020-01-07 DIAGNOSIS — I10 Essential (primary) hypertension: Secondary | ICD-10-CM | POA: Diagnosis present

## 2020-01-07 DIAGNOSIS — R072 Precordial pain: Secondary | ICD-10-CM | POA: Insufficient documentation

## 2020-01-07 LAB — MYOCARDIAL PERFUSION IMAGING
LV dias vol: 81 mL (ref 46–106)
LV sys vol: 41 mL
Peak HR: 110 {beats}/min
Rest HR: 89 {beats}/min
SDS: 0
SRS: 0
SSS: 0
TID: 1.09

## 2020-01-07 MED ORDER — REGADENOSON 0.4 MG/5ML IV SOLN
0.4000 mg | Freq: Once | INTRAVENOUS | Status: AC
  Administered 2020-01-07: 0.4 mg via INTRAVENOUS

## 2020-01-07 MED ORDER — TECHNETIUM TC 99M TETROFOSMIN IV KIT
31.7000 | PACK | Freq: Once | INTRAVENOUS | Status: AC | PRN
  Administered 2020-01-07: 31.7 via INTRAVENOUS
  Filled 2020-01-07: qty 32

## 2020-01-08 ENCOUNTER — Telehealth: Payer: Self-pay | Admitting: *Deleted

## 2020-01-08 DIAGNOSIS — R072 Precordial pain: Secondary | ICD-10-CM

## 2020-01-08 DIAGNOSIS — R9430 Abnormal result of cardiovascular function study, unspecified: Secondary | ICD-10-CM

## 2020-01-08 NOTE — Telephone Encounter (Signed)
Advised patient, order placed and message sent to scheduling to arrange Echo

## 2020-01-08 NOTE — Telephone Encounter (Signed)
-----   Message from Skeet Latch, MD sent at 01/08/2020  1:30 PM EDT ----- Stress test without sign of ischemia.  However it looks like her heart muscle may be weak.  Please get an echo to better assess.

## 2020-01-23 ENCOUNTER — Ambulatory Visit (INDEPENDENT_AMBULATORY_CARE_PROVIDER_SITE_OTHER): Payer: 59 | Admitting: Bariatrics

## 2020-01-31 ENCOUNTER — Ambulatory Visit (HOSPITAL_COMMUNITY): Payer: 59 | Attending: Cardiology

## 2020-01-31 ENCOUNTER — Other Ambulatory Visit: Payer: Self-pay

## 2020-01-31 DIAGNOSIS — R072 Precordial pain: Secondary | ICD-10-CM | POA: Diagnosis present

## 2020-01-31 DIAGNOSIS — R9430 Abnormal result of cardiovascular function study, unspecified: Secondary | ICD-10-CM | POA: Insufficient documentation

## 2020-01-31 LAB — ECHOCARDIOGRAM COMPLETE
Area-P 1/2: 3.12 cm2
S' Lateral: 3.1 cm

## 2020-02-05 ENCOUNTER — Encounter (INDEPENDENT_AMBULATORY_CARE_PROVIDER_SITE_OTHER): Payer: Self-pay | Admitting: Bariatrics

## 2020-02-05 ENCOUNTER — Other Ambulatory Visit: Payer: Self-pay

## 2020-02-05 ENCOUNTER — Ambulatory Visit (INDEPENDENT_AMBULATORY_CARE_PROVIDER_SITE_OTHER): Payer: 59 | Admitting: Bariatrics

## 2020-02-05 VITALS — BP 108/73 | HR 85 | Temp 98.3°F | Ht 66.0 in | Wt 295.0 lb

## 2020-02-05 DIAGNOSIS — Z9189 Other specified personal risk factors, not elsewhere classified: Secondary | ICD-10-CM | POA: Diagnosis not present

## 2020-02-05 DIAGNOSIS — R7303 Prediabetes: Secondary | ICD-10-CM

## 2020-02-05 DIAGNOSIS — Z6841 Body Mass Index (BMI) 40.0 and over, adult: Secondary | ICD-10-CM

## 2020-02-05 DIAGNOSIS — I10 Essential (primary) hypertension: Secondary | ICD-10-CM

## 2020-02-05 NOTE — Progress Notes (Signed)
Chief Complaint:   OBESITY Michelle Huerta is here to discuss her progress with her obesity treatment plan along with follow-up of her obesity related diagnoses. Michelle Huerta is on the Category 3 Plan and states she is following her eating plan approximately 20% of the time. Michelle Huerta states she is exercising 0 minutes 0 times per week.  Today's visit was #: 22 Starting weight: 296 lbs Starting date: 05/30/2018 Today's weight: 295 lbs Today's date: 02/05/2020 Total lbs lost to date: 1 Total lbs lost since last in-office visit: 0  Interim History: Michelle Huerta is up 4 lbs since her last visit on 01/02/2020. Her right knee is bothering her and she will be seeing the orthopedist.  Subjective:   Prediabetes. Michelle Huerta has a diagnosis of prediabetes based on her elevated HgA1c and was informed this puts her at greater risk of developing diabetes. She continues to work on diet and exercise to decrease her risk of diabetes. She denies nausea or hypoglycemia. Michelle Huerta reports having a normal appetite.  Lab Results  Component Value Date   HGBA1C 5.8 (H) 12/10/2019   Lab Results  Component Value Date   INSULIN 44.4 (H) 12/10/2019   INSULIN 36.3 (H) 06/24/2019   INSULIN 37.3 (H) 02/07/2019   INSULIN 41.4 (H) 05/30/2018   Essential hypertension. Blood pressure is controlled.  BP Readings from Last 3 Encounters:  02/05/20 108/73  01/02/20 129/87  12/10/19 132/83   Lab Results  Component Value Date   CREATININE 0.73 12/10/2019   CREATININE 0.82 06/05/2019   CREATININE 0.92 12/07/2018   At risk for heart disease. Michelle Huerta is at a higher than average risk for cardiovascular disease due to hypertension.   Assessment/Plan:   Prediabetes. Michelle Huerta will continue to work on weight loss, exercise, and decreasing simple carbohydrates to help decrease the risk of diabetes. Prescription was given for metformin 500 mg 1 PO BID with meals #60 with 0 refills. She will work on meal planning.  Essential hypertension. Michelle Huerta is  working on healthy weight loss and exercise to improve blood pressure control. We will watch for signs of hypotension as she continues her lifestyle modifications. Michelle Huerta will continue her medications as directed.   At risk for heart disease. Michelle Huerta was given approximately 15 minutes of coronary artery disease prevention counseling today. She is 59 y.o. female and has risk factors for heart disease including obesity. We discussed intensive lifestyle modifications today with an emphasis on specific weight loss instructions and strategies.   Repetitive spaced learning was employed today to elicit superior memory formation and behavioral change.  Class 3 severe obesity with serious comorbidity and body mass index (BMI) of 45.0 to 49.9 in adult, unspecified obesity type (Michelle Huerta).  Michelle Huerta is currently in the action stage of change. As such, her goal is to continue with weight loss efforts. She has agreed to the Category 3 Plan.   She will work on meal planning, intentional eating, being more adherent to the plan, increasing water intake, decreasing carbohydrates, and eating out less.  Handout was provided on "[place] [employer]  Exercise goals: Michelle Huerta will get a knee brace.  Behavioral modification strategies: increasing lean protein intake, decreasing simple carbohydrates, increasing vegetables, increasing water intake, decreasing eating out, no skipping meals, meal planning and cooking strategies, keeping healthy foods in the home and planning for success.  Michelle Huerta has agreed to follow-up with our clinic in 2 weeks. She was informed of the importance of frequent follow-up visits to maximize her success with intensive lifestyle  modifications for her multiple health conditions.   Objective:   Blood pressure 108/73, pulse 85, temperature 98.3 F (36.8 C), height 5\' 6"  (1.676 m), weight (!) 295 lb (133.8 kg), last menstrual period 08/01/2012, SpO2 99 %. Body mass index is 47.61 kg/m.  General: Cooperative, alert,  well developed, in no acute distress. HEENT: Conjunctivae and lids unremarkable. Cardiovascular: Regular rhythm.  Lungs: Normal work of breathing. Neurologic: No focal deficits.   Lab Results  Component Value Date   CREATININE 0.73 12/10/2019   BUN 18 12/10/2019   NA 141 12/10/2019   K 4.0 12/10/2019   CL 107 (H) 12/10/2019   CO2 21 12/10/2019   Lab Results  Component Value Date   ALT 23 12/10/2019   AST 20 12/10/2019   ALKPHOS 116 12/10/2019   BILITOT 0.2 12/10/2019   Lab Results  Component Value Date   HGBA1C 5.8 (H) 12/10/2019   HGBA1C 5.4 06/24/2019   HGBA1C 5.5 02/07/2019   HGBA1C 5.9 (A) 09/10/2018   HGBA1C 6.3 (H) 05/30/2018   Lab Results  Component Value Date   INSULIN 44.4 (H) 12/10/2019   INSULIN 36.3 (H) 06/24/2019   INSULIN 37.3 (H) 02/07/2019   INSULIN 41.4 (H) 05/30/2018   Lab Results  Component Value Date   TSH 2.630 12/10/2019   Lab Results  Component Value Date   CHOL 151 12/10/2019   HDL 66 12/10/2019   LDLCALC 67 12/10/2019   TRIG 98 12/10/2019   CHOLHDL 1.8 05/30/2018   Lab Results  Component Value Date   WBC 5.8 12/07/2018   HGB 12.7 12/07/2018   HCT 37.5 12/07/2018   MCV 80 12/07/2018   PLT 138 (L) 12/07/2018   No results found for: IRON, TIBC, FERRITIN  Attestation Statements:   Reviewed by clinician on day of visit: allergies, medications, problem list, medical history, surgical history, family history, social history, and previous encounter notes.  Migdalia Dk, am acting as Location manager for [employer], DO   I have reviewed the above documentation for accuracy and completeness, and I agree with the above. Jearld Lesch, DO

## 2020-02-10 MED FILL — AMLODIPINE BESYLATE 10 MG T: 10 | 90 days supply | Qty: 90 | Fill #1

## 2020-02-12 ENCOUNTER — Other Ambulatory Visit (HOSPITAL_COMMUNITY): Payer: Self-pay | Admitting: Family Medicine

## 2020-02-12 MED FILL — KETOCONAZOLE 2% CREAM: 2 | 14 days supply | Qty: 30 | Fill #0

## 2020-02-12 MED FILL — ZOLPIDEM TARTRATE 10 MG TAB: 10 | 45 days supply | Qty: 45 | Fill #0

## 2020-02-12 MED FILL — traZODone HCL 50 MG TABS: 50 | 90 days supply | Qty: 90 | Fill #0

## 2020-02-26 ENCOUNTER — Other Ambulatory Visit (INDEPENDENT_AMBULATORY_CARE_PROVIDER_SITE_OTHER): Payer: Self-pay | Admitting: Bariatrics

## 2020-02-26 ENCOUNTER — Other Ambulatory Visit: Payer: Self-pay

## 2020-02-26 ENCOUNTER — Encounter (INDEPENDENT_AMBULATORY_CARE_PROVIDER_SITE_OTHER): Payer: Self-pay | Admitting: Bariatrics

## 2020-02-26 ENCOUNTER — Ambulatory Visit (INDEPENDENT_AMBULATORY_CARE_PROVIDER_SITE_OTHER): Payer: 59 | Admitting: Bariatrics

## 2020-02-26 VITALS — BP 120/85 | HR 86 | Temp 98.5°F | Ht 66.0 in | Wt 296.0 lb

## 2020-02-26 DIAGNOSIS — Z9189 Other specified personal risk factors, not elsewhere classified: Secondary | ICD-10-CM | POA: Diagnosis not present

## 2020-02-26 DIAGNOSIS — I10 Essential (primary) hypertension: Secondary | ICD-10-CM | POA: Diagnosis not present

## 2020-02-26 DIAGNOSIS — R7303 Prediabetes: Secondary | ICD-10-CM

## 2020-02-26 DIAGNOSIS — Z6841 Body Mass Index (BMI) 40.0 and over, adult: Secondary | ICD-10-CM

## 2020-02-26 MED ORDER — METFORMIN HCL 500 MG PO TABS: 500.0000 mg | ORAL_TABLET | Freq: Two times a day (BID) | ORAL | 0 refills | Status: DC

## 2020-02-26 MED FILL — TELMISARTAN 80 MG TABS: 80 | 90 days supply | Qty: 90 | Fill #0

## 2020-02-26 MED FILL — VALACYCLOVIR HCL 500 MG TAB: 500 | 90 days supply | Qty: 90 | Fill #1

## 2020-02-26 MED FILL — METFORMIN HCL 500 MG TABS: 500 | 90 days supply | Qty: 90 | Fill #0

## 2020-02-26 MED FILL — FAMOTIDINE 20 MG TABS: 20 | 30 days supply | Qty: 60 | Fill #1

## 2020-02-27 ENCOUNTER — Ambulatory Visit (INDEPENDENT_AMBULATORY_CARE_PROVIDER_SITE_OTHER): Payer: 59 | Admitting: Bariatrics

## 2020-02-27 ENCOUNTER — Encounter (INDEPENDENT_AMBULATORY_CARE_PROVIDER_SITE_OTHER): Payer: Self-pay | Admitting: Bariatrics

## 2020-02-27 NOTE — Progress Notes (Signed)
Chief Complaint:   OBESITY Michelle Huerta is here to discuss her progress with her obesity treatment plan along with follow-up of her obesity related diagnoses. Michelle Huerta is on the Category 3 Plan and states she is following her eating plan approximately 0% of the time. Michelle Huerta states she is exercising 0 minutes 0 times per week.  Today's visit was #: 23 Starting weight: 296 lbs Starting date: 05/30/2018 Today's weight: 296 lbs Today's date: 02/26/2020 Total lbs lost to date: 0 Total lbs lost since last in-office visit: 0  Interim History: Michelle Huerta is up 1 lb since her last visit. She will see the orthopedist today for possible knee replacement.  Subjective:   Prediabetes. Teola has a diagnosis of prediabetes based on her elevated HgA1c and was informed this puts her at greater risk of developing diabetes. She continues to work on diet and exercise to decrease her risk of diabetes. She denies nausea or hypoglycemia. No polyphagia.  Lab Results  Component Value Date   HGBA1C 5.8 (H) 12/10/2019   Lab Results  Component Value Date   INSULIN 44.4 (H) 12/10/2019   INSULIN 36.3 (H) 06/24/2019   INSULIN 37.3 (H) 02/07/2019   INSULIN 41.4 (H) 05/30/2018   Essential hypertension. Michelle Huerta is taking Lopressor and Micardis. Blood pressure is controlled.  BP Readings from Last 3 Encounters:  02/26/20 120/85  02/05/20 108/73  01/02/20 129/87   Lab Results  Component Value Date   CREATININE 0.73 12/10/2019   CREATININE 0.82 06/05/2019   CREATININE 0.92 12/07/2018   At risk for osteoporosis. Michelle Huerta is at higher risk of osteopenia and osteoporosis due to Vitamin D deficiency.   Assessment/Plan:   Prediabetes. Michelle Huerta will continue to work on weight loss, exercise, and decreasing simple carbohydrates to help decrease the risk of diabetes. Prescription was given for metFORMIN (GLUCOPHAGE) 500 MG tablet 1 PO BID with meals #60 with 0 refills.  Essential hypertension. Michelle Huerta is working on healthy weight  loss and exercise to improve blood pressure control. We will watch for signs of hypotension as she continues her lifestyle modifications. She will continue her medications as directed.   At risk for osteoporosis. Michelle Huerta was given approximately 15 minutes of osteoporosis prevention counseling today. Michelle Huerta is at risk for osteopenia and osteoporosis due to her Vitamin D deficiency. She was encouraged to take her Vitamin D and follow her higher calcium diet and increase strengthening exercise to help strengthen her bones and decrease her risk of osteopenia and osteoporosis.  Repetitive spaced learning was employed today to elicit superior memory formation and behavioral change.  Class 3 severe obesity with serious comorbidity and body mass index (BMI) of 45.0 to 49.9 in adult, unspecified obesity type ([place]).  Michelle Huerta is currently in the action stage of change. As such, her goal is to continue with weight loss efforts. She has agreed to the Category 3 Plan.   She will work on meal planning, intentional eating, and adhering to the plan.  Exercise goals: Michelle Huerta has pain in the right knee, which has decreased her exercising.  Behavioral modification strategies: increasing lean protein intake, decreasing simple carbohydrates, increasing vegetables, increasing water intake, decreasing eating out, no skipping meals, meal planning and cooking strategies, keeping healthy foods in the home and planning for success.  Michelle Huerta has agreed to follow-up with our clinic in 2-3 weeks. She was informed of the importance of frequent follow-up visits to maximize her success with intensive lifestyle modifications for her multiple health conditions.   Objective:  Blood pressure 120/85, pulse 86, temperature 98.5 F (36.9 C), height 5\' 6"  (1.676 m), weight 296 lb (134.3 kg), last menstrual period 08/01/2012, SpO2 100 %. Body mass index is 47.78 kg/m.  General: Cooperative, alert, well developed, in no acute distress. HEENT:  Conjunctivae and lids unremarkable. Cardiovascular: Regular rhythm.  Lungs: Normal work of breathing. Neurologic: No focal deficits.   Lab Results  Component Value Date   CREATININE 0.73 12/10/2019   BUN 18 12/10/2019   NA 141 12/10/2019   K 4.0 12/10/2019   CL 107 (H) 12/10/2019   CO2 21 12/10/2019   Lab Results  Component Value Date   ALT 23 12/10/2019   AST 20 12/10/2019   ALKPHOS 116 12/10/2019   BILITOT 0.2 12/10/2019   Lab Results  Component Value Date   HGBA1C 5.8 (H) 12/10/2019   HGBA1C 5.4 06/24/2019   HGBA1C 5.5 02/07/2019   HGBA1C 5.9 (A) 09/10/2018   HGBA1C 6.3 (H) 05/30/2018   Lab Results  Component Value Date   INSULIN 44.4 (H) 12/10/2019   INSULIN 36.3 (H) 06/24/2019   INSULIN 37.3 (H) 02/07/2019   INSULIN 41.4 (H) 05/30/2018   Lab Results  Component Value Date   TSH 2.630 12/10/2019   Lab Results  Component Value Date   CHOL 151 12/10/2019   HDL 66 12/10/2019   LDLCALC 67 12/10/2019   TRIG 98 12/10/2019   CHOLHDL 1.8 05/30/2018   Lab Results  Component Value Date   WBC 5.8 12/07/2018   HGB 12.7 12/07/2018   HCT 37.5 12/07/2018   MCV 80 12/07/2018   PLT 138 (L) 12/07/2018   No results found for: IRON, TIBC, FERRITIN  Attestation Statements:   Reviewed by clinician on day of visit: allergies, medications, problem list, medical history, surgical history, family history, social history, and previous encounter notes.  Migdalia Dk, am acting as Location manager for [employer], DO   I have reviewed the above documentation for accuracy and completeness, and I agree with the above. Jearld Lesch, DO

## 2020-03-17 ENCOUNTER — Ambulatory Visit (INDEPENDENT_AMBULATORY_CARE_PROVIDER_SITE_OTHER): Payer: 59 | Admitting: Bariatrics

## 2020-03-17 ENCOUNTER — Encounter (INDEPENDENT_AMBULATORY_CARE_PROVIDER_SITE_OTHER): Payer: Self-pay | Admitting: Bariatrics

## 2020-03-17 ENCOUNTER — Other Ambulatory Visit: Payer: Self-pay

## 2020-03-17 VITALS — BP 137/78 | HR 75 | Temp 97.9°F | Ht 66.0 in | Wt 298.0 lb

## 2020-03-17 DIAGNOSIS — Z9189 Other specified personal risk factors, not elsewhere classified: Secondary | ICD-10-CM

## 2020-03-17 DIAGNOSIS — R7303 Prediabetes: Secondary | ICD-10-CM

## 2020-03-17 DIAGNOSIS — I10 Essential (primary) hypertension: Secondary | ICD-10-CM | POA: Diagnosis not present

## 2020-03-17 DIAGNOSIS — M1711 Unilateral primary osteoarthritis, right knee: Secondary | ICD-10-CM | POA: Diagnosis not present

## 2020-03-17 DIAGNOSIS — Z6841 Body Mass Index (BMI) 40.0 and over, adult: Secondary | ICD-10-CM

## 2020-03-17 MED ORDER — METFORMIN HCL 500 MG PO TABS: 500.0000 mg | ORAL_TABLET | Freq: Two times a day (BID) | ORAL | 0 refills | Status: DC

## 2020-03-17 NOTE — Progress Notes (Signed)
Chief Complaint:   OBESITY Michelle Huerta is here to discuss her progress with her obesity treatment plan along with follow-up of her obesity related diagnoses. Michelle Huerta is on the Category 3 Plan and states she is following her eating plan approximately 20-30% of the time. Colby states she is exercising 0 minutes 0 times per week.-  Today's visit was #: 24 Starting weight: 296 lbs Starting date: 05/30/2018 Today's weight: 298 lbs Today's date: 03/17/2020 Total lbs lost to date: 0 Total lbs lost since last in-office visit: 0  Interim History: Michelle Huerta is up 2 lbs. She reports doing better with her water intake. She does state she has been more stressed. She needs a knee replacement and needs to lose weight in order to have this procedure.  Subjective:   Prediabetes. Klover has a diagnosis of prediabetes based on her elevated HgA1c and was informed this puts her at greater risk of developing diabetes. She continues to work on diet and exercise to decrease her risk of diabetes. She denies nausea or hypoglycemia. No polyphagia.  Lab Results  Component Value Date   HGBA1C 5.8 (H) 12/10/2019   Lab Results  Component Value Date   INSULIN 44.4 (H) 12/10/2019   INSULIN 36.3 (H) 06/24/2019   INSULIN 37.3 (H) 02/07/2019   INSULIN 41.4 (H) 05/30/2018   Essential hypertension. Blood pressure is controlled.  BP Readings from Last 3 Encounters:  03/17/20 137/78  02/26/20 120/85  02/05/20 108/73   Lab Results  Component Value Date   CREATININE 0.73 12/10/2019   CREATININE 0.82 06/05/2019   CREATININE 0.92 12/07/2018   Osteoarthritis of right knee, unspecified osteoarthritis type. Libertie continues to have pain and uses Advil as needed.  At risk for diabetes mellitus. Michelle Huerta is at higher than average risk for developing diabetes due to prediabetes and obesity.   Assessment/Plan:   Prediabetes. Danasha will continue to work on weight loss, exercise, and decreasing simple carbohydrates to help  decrease the risk of diabetes. Prescription was given for metFORMIN (GLUCOPHAGE) 500 MG tablet 1 BID with meals #60 with 0 refills.  Essential hypertension. Michelle Huerta is working on healthy weight loss and exercise to improve blood pressure control. We will watch for signs of hypotension as she continues her lifestyle modifications. She will continue her medication as directed.   Osteoarthritis of right knee, unspecified osteoarthritis type. Michelle Huerta was instructed to avoid pounding exercises.  At risk for diabetes mellitus. Michelle Huerta was given approximately 15 minutes of diabetes education and counseling today. We discussed intensive lifestyle modifications today with an emphasis on weight loss as well as increasing exercise and decreasing simple carbohydrates in her diet. We also reviewed medication options with an emphasis on risk versus benefit of those discussed.   Repetitive spaced learning was employed today to elicit superior memory formation and behavioral change.  Class 3 severe obesity with serious comorbidity and body mass index (BMI) of 45.0 to 49.9 in adult, unspecified obesity type ([place]).  Michelle Huerta is currently in the action stage of change. As such, her goal is to continue with weight loss efforts. She has agreed to the Category 3 Plan.   She will work on meal planning, intentional eating, increasing her water and protein intake, and decreasing her carbs.   Exercise goals: All adults should avoid inactivity. Some physical activity is better than none, and adults who participate in any amount of physical activity gain some health benefits.  Behavioral modification strategies: increasing lean protein intake, decreasing simple carbohydrates, increasing  vegetables, increasing water intake, decreasing eating out, no skipping meals, meal planning and cooking strategies and keeping healthy foods in the home.  Zacari has agreed to follow-up with our clinic in 3 weeks. She was informed of the importance of  frequent follow-up visits to maximize her success with intensive lifestyle modifications for her multiple health conditions.   Objective:   Blood pressure 137/78, pulse 75, temperature 97.9 F (36.6 C), height 5\' 6"  (1.676 m), weight 298 lb (135.2 kg), last menstrual period 08/01/2012, SpO2 98 %. Body mass index is 48.1 kg/m.  General: Cooperative, alert, well developed, in no acute distress. HEENT: Conjunctivae and lids unremarkable. Cardiovascular: Regular rhythm.  Lungs: Normal work of breathing. Neurologic: No focal deficits.   Lab Results  Component Value Date   CREATININE 0.73 12/10/2019   BUN 18 12/10/2019   NA 141 12/10/2019   K 4.0 12/10/2019   CL 107 (H) 12/10/2019   CO2 21 12/10/2019   Lab Results  Component Value Date   ALT 23 12/10/2019   AST 20 12/10/2019   ALKPHOS 116 12/10/2019   BILITOT 0.2 12/10/2019   Lab Results  Component Value Date   HGBA1C 5.8 (H) 12/10/2019   HGBA1C 5.4 06/24/2019   HGBA1C 5.5 02/07/2019   HGBA1C 5.9 (A) 09/10/2018   HGBA1C 6.3 (H) 05/30/2018   Lab Results  Component Value Date   INSULIN 44.4 (H) 12/10/2019   INSULIN 36.3 (H) 06/24/2019   INSULIN 37.3 (H) 02/07/2019   INSULIN 41.4 (H) 05/30/2018   Lab Results  Component Value Date   TSH 2.630 12/10/2019   Lab Results  Component Value Date   CHOL 151 12/10/2019   HDL 66 12/10/2019   LDLCALC 67 12/10/2019   TRIG 98 12/10/2019   CHOLHDL 1.8 05/30/2018   Lab Results  Component Value Date   WBC 5.8 12/07/2018   HGB 12.7 12/07/2018   HCT 37.5 12/07/2018   MCV 80 12/07/2018   PLT 138 (L) 12/07/2018   No results found for: IRON, TIBC, FERRITIN  Attestation Statements:   Reviewed by clinician on day of visit: allergies, medications, problem list, medical history, surgical history, family history, social history, and previous encounter notes.  Migdalia Dk, am acting as Location manager for [employer], DO   I have reviewed the above documentation for accuracy  and completeness, and I agree with the above. Jearld Lesch, DO

## 2020-03-19 ENCOUNTER — Encounter (INDEPENDENT_AMBULATORY_CARE_PROVIDER_SITE_OTHER): Payer: Self-pay | Admitting: Bariatrics

## 2020-04-06 ENCOUNTER — Telehealth: Payer: Self-pay | Admitting: Physician Assistant

## 2020-04-06 NOTE — Telephone Encounter (Signed)
Pt states only half her records were sent to [facility] and she would like a CB when the second half has been sent   [phone]

## 2020-04-06 NOTE — Telephone Encounter (Signed)
Ic, spoke with patient. She states [facility] told her they only received part 1 of a 2 part fax. I refaxed part 2, per patients request [phone]

## 2020-04-07 ENCOUNTER — Other Ambulatory Visit (INDEPENDENT_AMBULATORY_CARE_PROVIDER_SITE_OTHER): Payer: Self-pay | Admitting: Bariatrics

## 2020-04-07 ENCOUNTER — Telehealth (INDEPENDENT_AMBULATORY_CARE_PROVIDER_SITE_OTHER): Payer: Self-pay | Admitting: Bariatrics

## 2020-04-07 ENCOUNTER — Encounter (INDEPENDENT_AMBULATORY_CARE_PROVIDER_SITE_OTHER): Payer: Self-pay | Admitting: Bariatrics

## 2020-04-07 ENCOUNTER — Ambulatory Visit (INDEPENDENT_AMBULATORY_CARE_PROVIDER_SITE_OTHER): Payer: 59 | Admitting: Bariatrics

## 2020-04-07 ENCOUNTER — Other Ambulatory Visit: Payer: Self-pay

## 2020-04-07 VITALS — BP 138/89 | HR 78 | Temp 97.6°F | Ht 66.0 in | Wt 298.0 lb

## 2020-04-07 DIAGNOSIS — Z6841 Body Mass Index (BMI) 40.0 and over, adult: Secondary | ICD-10-CM

## 2020-04-07 DIAGNOSIS — R7303 Prediabetes: Secondary | ICD-10-CM | POA: Diagnosis not present

## 2020-04-07 DIAGNOSIS — Z9189 Other specified personal risk factors, not elsewhere classified: Secondary | ICD-10-CM | POA: Diagnosis not present

## 2020-04-07 DIAGNOSIS — E559 Vitamin D deficiency, unspecified: Secondary | ICD-10-CM | POA: Diagnosis not present

## 2020-04-07 DIAGNOSIS — I10 Essential (primary) hypertension: Secondary | ICD-10-CM | POA: Diagnosis not present

## 2020-04-07 MED ORDER — VITAMIN D (ERGOCALCIFEROL) 1.25 MG (50000 UNIT) PO CAPS: 50000.0000 [IU] | ORAL_CAPSULE | ORAL | 0 refills | Status: AC

## 2020-04-07 MED ORDER — METFORMIN HCL 500 MG PO TABS: 500.0000 mg | ORAL_TABLET | Freq: Two times a day (BID) | ORAL | 0 refills | Status: DC

## 2020-04-07 MED FILL — VIT D2 1.25 MG (50,000 UNIT: 1.25 MG | 28 days supply | Qty: 4 | Fill #0

## 2020-04-07 NOTE — Telephone Encounter (Signed)
Pt requested Vit D

## 2020-04-07 NOTE — Telephone Encounter (Signed)
Sent to Dr. Owens Shark

## 2020-04-08 ENCOUNTER — Encounter (INDEPENDENT_AMBULATORY_CARE_PROVIDER_SITE_OTHER): Payer: Self-pay | Admitting: Bariatrics

## 2020-04-08 NOTE — Progress Notes (Signed)
Chief Complaint:   OBESITY Michelle Huerta is here to discuss her progress with her obesity treatment plan along with follow-up of her obesity related diagnoses. Michelle Huerta is on the Category 3 Plan and states she is following her eating plan approximately 30% of the time. Michelle Huerta states she is exercising for 0 minutes 0 times per week.  Today's visit was #: 25 Starting weight: 296 lbs Starting date: 05/30/2018 Today's weight: 298 lbs Today's date: 04/07/2020 Total lbs lost to date: 0 Total lbs lost since last in-office visit: 0  Interim History: Michelle Huerta's weight remains the same as at her last visit.  She has been very busy.  Subjective:   1. Essential hypertension Review: taking medications as instructed, no medication side effects noted, no chest pain on exertion, no dyspnea on exertion, no swelling of ankles.  Reasonably well-controlled.  She did not take her medication this morning.  BP Readings from Last 3 Encounters:  04/07/20 138/89  03/17/20 137/78  02/26/20 120/85   2. Prediabetes Michelle Huerta has a diagnosis of prediabetes based on her elevated HgA1c and was informed this puts her at greater risk of developing diabetes. She continues to work on diet and exercise to decrease her risk of diabetes. She denies nausea or hypoglycemia.  Denies polyphagia.  Lab Results  Component Value Date   HGBA1C 5.8 (H) 12/10/2019   Lab Results  Component Value Date   INSULIN 44.4 (H) 12/10/2019   INSULIN 36.3 (H) 06/24/2019   INSULIN 37.3 (H) 02/07/2019   INSULIN 41.4 (H) 05/30/2018   3. Vitamin D deficiency Michelle Huerta's Vitamin D level was 41.3 on 12/10/2019. She is currently taking prescription vitamin D 50,000 IU each week. She denies nausea, vomiting or muscle weakness.  4. At risk for heart disease Michelle Huerta is at a higher than average risk for cardiovascular disease due to obesity and hypertension.   Assessment/Plan:   1. Essential hypertension Michelle Huerta is working on healthy weight loss and exercise to improve  blood pressure control. We will watch for signs of hypotension as she continues her lifestyle modifications.  Continue medication.  Will monitor closely.  2. Prediabetes Michelle Huerta will continue to work on weight loss, exercise, and decreasing simple carbohydrates to help decrease the risk of diabetes.  Refill metformin, as per below, and decrease intake of carbohydrates.  - metFORMIN (GLUCOPHAGE) 500 MG tablet; Take 1 tablet (500 mg total) by mouth 2 (two) times daily with a meal.  Dispense: 60 tablet; Refill: 0  3. Vitamin D deficiency Low Vitamin D level contributes to fatigue and are associated with obesity, breast, and colon cancer. She agrees to continue to take prescription Vitamin D @50 ,000 IU every week and will follow-up for routine testing of Vitamin D, at least 2-3 times per year to avoid over-replacement.  - Vitamin D, Ergocalciferol, (DRISDOL) 1.25 MG (50000 UNIT) CAPS capsule; Take 1 capsule (50,000 Units total) by mouth every 7 (seven) days.  Dispense: 4 capsule; Refill: 0  4. At risk for heart disease Michelle Huerta was given approximately 15 minutes of coronary artery disease prevention counseling today. She is 59 y.o. female and has risk factors for heart disease including obesity. We discussed intensive lifestyle modifications today with an emphasis on specific weight loss instructions and strategies.   Repetitive spaced learning was employed today to elicit superior memory formation and behavioral change.  5. Class 3 severe obesity with serious comorbidity and body mass index (BMI) of 45.0 to 49.9 in adult, unspecified obesity type (HCC)  Michelle Huerta is  currently in the action stage of change. As such, her goal is to continue with weight loss efforts. She has agreed to the Category 3 Plan.   She will work on meal planning and intentional eating.  She will take her water bottle and will adhere more closely to the plan.  Exercise goals: For substantial health benefits, adults should do at least  150 minutes (2 hours and 30 minutes) a week of moderate-intensity, or 75 minutes (1 hour and 15 minutes) a week of vigorous-intensity aerobic physical activity, or an equivalent combination of moderate- and vigorous-intensity aerobic activity. Aerobic activity should be performed in episodes of at least 10 minutes, and preferably, it should be spread throughout the week.  Behavioral modification strategies: increasing lean protein intake, decreasing simple carbohydrates, increasing vegetables, increasing water intake, decreasing eating out, no skipping meals, meal planning and cooking strategies, keeping healthy foods in the home and planning for success.  Michelle Huerta has agreed to follow-up with our clinic in 2 weeks. She was informed of the importance of frequent follow-up visits to maximize her success with intensive lifestyle modifications for her multiple health conditions.   Objective:   Blood pressure 138/89, pulse 78, temperature 97.6 F (36.4 C), height 5\' 6"  (1.676 m), weight 298 lb (135.2 kg), last menstrual period 08/01/2012, SpO2 100 %. Body mass index is 48.1 kg/m.  General: Cooperative, alert, well developed, in no acute distress. HEENT: Conjunctivae and lids unremarkable. Cardiovascular: Regular rhythm.  Lungs: Normal work of breathing. Neurologic: No focal deficits.   Lab Results  Component Value Date   CREATININE 0.73 12/10/2019   BUN 18 12/10/2019   NA 141 12/10/2019   K 4.0 12/10/2019   CL 107 (H) 12/10/2019   CO2 21 12/10/2019   Lab Results  Component Value Date   ALT 23 12/10/2019   AST 20 12/10/2019   ALKPHOS 116 12/10/2019   BILITOT 0.2 12/10/2019   Lab Results  Component Value Date   HGBA1C 5.8 (H) 12/10/2019   HGBA1C 5.4 06/24/2019   HGBA1C 5.5 02/07/2019   HGBA1C 5.9 (A) 09/10/2018   HGBA1C 6.3 (H) 05/30/2018   Lab Results  Component Value Date   INSULIN 44.4 (H) 12/10/2019   INSULIN 36.3 (H) 06/24/2019   INSULIN 37.3 (H) 02/07/2019   INSULIN 41.4  (H) 05/30/2018   Lab Results  Component Value Date   TSH 2.630 12/10/2019   Lab Results  Component Value Date   CHOL 151 12/10/2019   HDL 66 12/10/2019   LDLCALC 67 12/10/2019   TRIG 98 12/10/2019   CHOLHDL 1.8 05/30/2018   Lab Results  Component Value Date   WBC 5.8 12/07/2018   HGB 12.7 12/07/2018   HCT 37.5 12/07/2018   MCV 80 12/07/2018   PLT 138 (L) 12/07/2018   Attestation Statements:   Reviewed by clinician on day of visit: allergies, medications, problem list, medical history, surgical history, family history, social history, and previous encounter notes.  I, Water quality scientist, CMA, am acting as Location manager for [employer], DO  I have reviewed the above documentation for accuracy and completeness, and I agree with the above. Jearld Lesch, DO

## 2020-04-16 ENCOUNTER — Other Ambulatory Visit: Payer: Self-pay | Admitting: Family Medicine

## 2020-04-16 DIAGNOSIS — Z1231 Encounter for screening mammogram for malignant neoplasm of breast: Secondary | ICD-10-CM

## 2020-04-28 ENCOUNTER — Ambulatory Visit (INDEPENDENT_AMBULATORY_CARE_PROVIDER_SITE_OTHER): Payer: 59 | Admitting: Bariatrics

## 2020-05-05 ENCOUNTER — Other Ambulatory Visit (HOSPITAL_COMMUNITY): Payer: Self-pay | Admitting: Family Medicine

## 2020-05-05 MED FILL — VIT D2 1.25 MG (50,000 UNIT: 1.25 MG | 84 days supply | Qty: 12 | Fill #2

## 2020-05-05 MED FILL — IBUPROFEN 800 MG TABS: 800 | 30 days supply | Qty: 60 | Fill #1

## 2020-05-05 MED FILL — ZOLPIDEM TARTRATE 10 MG TAB: 10 | 45 days supply | Qty: 45 | Fill #1

## 2020-05-06 MED FILL — FLUTICASONE PROP 50 MCG SPR: 50 | 30 days supply | Qty: 16 | Fill #0

## 2020-05-06 MED FILL — ALBUTEROL SULFATE HFA 108 (: 108 (90 BAS | 25 days supply | Qty: 18 | Fill #0

## 2020-05-19 ENCOUNTER — Ambulatory Visit (INDEPENDENT_AMBULATORY_CARE_PROVIDER_SITE_OTHER): Payer: 59 | Admitting: Bariatrics

## 2020-05-19 ENCOUNTER — Encounter (INDEPENDENT_AMBULATORY_CARE_PROVIDER_SITE_OTHER): Payer: Self-pay | Admitting: Bariatrics

## 2020-05-19 ENCOUNTER — Other Ambulatory Visit: Payer: Self-pay

## 2020-05-19 VITALS — BP 135/86 | HR 71 | Temp 97.7°F | Ht 66.0 in | Wt 292.0 lb

## 2020-05-19 DIAGNOSIS — Z9189 Other specified personal risk factors, not elsewhere classified: Secondary | ICD-10-CM

## 2020-05-19 DIAGNOSIS — Z6841 Body Mass Index (BMI) 40.0 and over, adult: Secondary | ICD-10-CM

## 2020-05-19 DIAGNOSIS — I10 Essential (primary) hypertension: Secondary | ICD-10-CM | POA: Diagnosis not present

## 2020-05-19 DIAGNOSIS — R7303 Prediabetes: Secondary | ICD-10-CM | POA: Diagnosis not present

## 2020-05-19 DIAGNOSIS — E559 Vitamin D deficiency, unspecified: Secondary | ICD-10-CM

## 2020-05-19 NOTE — Progress Notes (Signed)
Chief Complaint:   OBESITY Michelle Huerta is here to discuss her progress with her obesity treatment plan along with follow-up of her obesity related diagnoses. Michelle Huerta is on the Category 3 Plan and states she is following her eating plan approximately 20% of the time. Michelle Huerta states she is exercising 0 minutes 0 times per week.  Today's visit was #: 80 Starting weight: 296 lbs Starting date: 05/30/2018 Today's weight: 292 lbs Today's date: 05/19/2020 Total lbs lost to date: 4 Total lbs lost since last in-office visit: 6  Interim History: Michelle Huerta is down 6 lbs since her last visit.  Subjective:   Vitamin D deficiency. Michelle Huerta is taking high dose Vitamin D supplementation.    Ref. Range 12/10/2019 10:46  Vitamin D, 25-Hydroxy Latest Ref Range: 30.0 - 100.0 ng/mL 41.3   Prediabetes. Michelle Huerta has a diagnosis of prediabetes based on her elevated HgA1c and was informed this puts her at greater risk of developing diabetes. She continues to work on diet and exercise to decrease her risk of diabetes. She denies nausea or hypoglycemia. Michelle Huerta is taking metformin.  Lab Results  Component Value Date   HGBA1C 5.8 (H) 12/10/2019   Lab Results  Component Value Date   INSULIN 44.4 (H) 12/10/2019   INSULIN 36.3 (H) 06/24/2019   INSULIN 37.3 (H) 02/07/2019   INSULIN 41.4 (H) 05/30/2018   Essential hypertension. Michelle Huerta saw her PCP last week. She is taking Lopressor and Micardis. Blood pressure is elevated today at 135/86.  BP Readings from Last 3 Encounters:  05/19/20 135/86  04/07/20 138/89  03/17/20 137/78   Lab Results  Component Value Date   CREATININE 0.73 12/10/2019   CREATININE 0.82 06/05/2019   CREATININE 0.92 12/07/2018   At risk for hypoglycemia. Michelle Huerta is at increased risk for hypoglycemia due to prediabetes.  Assessment/Plan:   Vitamin D deficiency. Low Vitamin D level contributes to fatigue and are associated with obesity, breast, and colon cancer. VITAMIN D 25 Hydroxy (Vit-D Deficiency,  Fractures) level will be checked today.   Prediabetes. Michelle Huerta will continue to work on weight loss, exercise, and decreasing simple carbohydrates to help decrease the risk of diabetes. Comprehensive metabolic panel, Insulin, random, Hemoglobin A1c labs will be checked today.   Essential hypertension.  Michelle Huerta is working on healthy weight loss and exercise to improve blood pressure control. We will watch for signs of hypotension as she continues her lifestyle modifications. Michelle Huerta will follow-up with her PCP tomorrow. Comprehensive metabolic panel will be checked today.  At risk for hypoglycemia. Michelle Huerta was given approximately 15 minutes of counseling today regarding prevention of hypoglycemia. She was advised of symptoms of hypoglycemia. Michelle Huerta was instructed to avoid skipping meals, eat regular protein rich meals and schedule low calorie snacks as needed.   Repetitive spaced learning was employed today to elicit superior memory formation and behavioral change.  Class 3 severe obesity with serious comorbidity and body mass index (BMI) of 45.0 to 49.9 in adult, unspecified obesity type (Michelle Huerta).  Michelle Huerta is currently in the action stage of change. As such, her goal is to continue with weight loss efforts. She has agreed to the Category 3 Plan.   She will work on meal planning and will adhere closely to the plan.  Exercise goals: All adults should avoid inactivity. Some physical activity is better than none, and adults who participate in any amount of physical activity gain some health benefits.  Behavioral modification strategies: increasing lean protein intake, decreasing simple carbohydrates, increasing vegetables, increasing  water intake, decreasing eating out, no skipping meals, meal planning and cooking strategies, keeping healthy foods in the home and planning for success.  Michelle Huerta has agreed to follow-up with our clinic in 3 weeks. She was informed of the importance of frequent follow-up visits to maximize her  success with intensive lifestyle modifications for her multiple health conditions.   Michelle Huerta was informed we would discuss her lab results at her next visit unless there is a critical issue that needs to be addressed sooner. Michelle Huerta agreed to keep her next visit at the agreed upon time to discuss these results.  Objective:   Blood pressure 135/86, pulse 71, temperature 97.7 F (36.5 C), temperature source Oral, height 5\' 6"  (1.676 m), weight 292 lb (132.5 kg), last menstrual period 08/01/2012, SpO2 100 %. Body mass index is 47.13 kg/m.  General: Cooperative, alert, well developed, in no acute distress. HEENT: Conjunctivae and lids unremarkable. Cardiovascular: Regular rhythm.  Lungs: Normal work of breathing. Neurologic: No focal deficits.   Lab Results  Component Value Date   CREATININE 0.73 12/10/2019   BUN 18 12/10/2019   NA 141 12/10/2019   K 4.0 12/10/2019   CL 107 (H) 12/10/2019   CO2 21 12/10/2019   Lab Results  Component Value Date   ALT 23 12/10/2019   AST 20 12/10/2019   ALKPHOS 116 12/10/2019   BILITOT 0.2 12/10/2019   Lab Results  Component Value Date   HGBA1C 5.8 (H) 12/10/2019   HGBA1C 5.4 06/24/2019   HGBA1C 5.5 02/07/2019   HGBA1C 5.9 (A) 09/10/2018   HGBA1C 6.3 (H) 05/30/2018   Lab Results  Component Value Date   INSULIN 44.4 (H) 12/10/2019   INSULIN 36.3 (H) 06/24/2019   INSULIN 37.3 (H) 02/07/2019   INSULIN 41.4 (H) 05/30/2018   Lab Results  Component Value Date   TSH 2.630 12/10/2019   Lab Results  Component Value Date   CHOL 151 12/10/2019   HDL 66 12/10/2019   LDLCALC 67 12/10/2019   TRIG 98 12/10/2019   CHOLHDL 1.8 05/30/2018   Lab Results  Component Value Date   WBC 5.8 12/07/2018   HGB 12.7 12/07/2018   HCT 37.5 12/07/2018   MCV 80 12/07/2018   PLT 138 (L) 12/07/2018   No results found for: IRON, TIBC, FERRITIN  Attestation Statements:   Reviewed by clinician on day of visit: allergies, medications, problem list, medical  history, surgical history, family history, social history, and previous encounter notes.  Migdalia Dk, am acting as Location manager for [employer], DO   I have reviewed the above documentation for accuracy and completeness, and I agree with the above. Jearld Lesch, DO

## 2020-05-20 ENCOUNTER — Other Ambulatory Visit (INDEPENDENT_AMBULATORY_CARE_PROVIDER_SITE_OTHER): Payer: Self-pay | Admitting: Bariatrics

## 2020-05-20 ENCOUNTER — Other Ambulatory Visit (INDEPENDENT_AMBULATORY_CARE_PROVIDER_SITE_OTHER): Payer: Self-pay

## 2020-05-20 DIAGNOSIS — R7303 Prediabetes: Secondary | ICD-10-CM

## 2020-05-20 LAB — COMPREHENSIVE METABOLIC PANEL
ALT: 24 IU/L (ref 0–32)
AST: 20 IU/L (ref 0–40)
Albumin/Globulin Ratio: 1.1 — ABNORMAL LOW (ref 1.2–2.2)
Albumin: 4.3 g/dL (ref 3.8–4.9)
Alkaline Phosphatase: 99 IU/L (ref 44–121)
BUN/Creatinine Ratio: 17 (ref 9–23)
BUN: 13 mg/dL (ref 6–24)
Bilirubin Total: 0.6 mg/dL (ref 0.0–1.2)
CO2: 24 mmol/L (ref 20–29)
Calcium: 9.5 mg/dL (ref 8.7–10.2)
Chloride: 104 mmol/L (ref 96–106)
Creatinine, Ser: 0.77 mg/dL (ref 0.57–1.00)
GFR calc Af Amer: 99 mL/min/{1.73_m2} (ref >59)
GFR calc non Af Amer: 86 mL/min/{1.73_m2} (ref >59)
Globulin, Total: 3.9 g/dL (ref 1.5–4.5)
Glucose: 86 mg/dL (ref 65–99)
Potassium: 4 mmol/L (ref 3.5–5.2)
Sodium: 141 mmol/L (ref 134–144)
Total Protein: 8.2 g/dL (ref 6.0–8.5)

## 2020-05-20 LAB — HEMOGLOBIN A1C
Est. average glucose Bld gHb Est-mCnc: 120 mg/dL
Hgb A1c MFr Bld: 5.8 % — ABNORMAL HIGH (ref 4.8–5.6)

## 2020-05-20 LAB — INSULIN, RANDOM: INSULIN: 32.9 u[IU]/mL — ABNORMAL HIGH (ref 2.6–24.9)

## 2020-05-20 LAB — VITAMIN D 25 HYDROXY (VIT D DEFICIENCY, FRACTURES): Vit D, 25-Hydroxy: 44.3 ng/mL (ref 30.0–100.0)

## 2020-05-20 MED ORDER — METFORMIN HCL 500 MG PO TABS: 500.0000 mg | ORAL_TABLET | Freq: Two times a day (BID) | ORAL | 0 refills | Status: DC

## 2020-05-20 MED FILL — TELMISARTAN 80 MG TABS: 80 | 90 days supply | Qty: 90 | Fill #1

## 2020-05-20 MED FILL — METFORMIN HCL 500 MG TABS: 500 | 30 days supply | Qty: 60 | Fill #0

## 2020-05-20 MED FILL — AMLODIPINE BESYLATE 10 MG T: 10 | 90 days supply | Qty: 90 | Fill #0

## 2020-06-08 MED FILL — FLUTICASONE PROP 50 MCG SPR: 50 | 30 days supply | Qty: 16 | Fill #1

## 2020-06-08 MED FILL — BETAMETHASONE DP 0.05% CRM: 0.05 | 30 days supply | Qty: 45 | Fill #1

## 2020-06-09 ENCOUNTER — Other Ambulatory Visit (HOSPITAL_COMMUNITY): Payer: Self-pay | Admitting: Family Medicine

## 2020-06-09 MED FILL — VALACYCLOVIR HCL 500 MG TAB: 500 | 90 days supply | Qty: 90 | Fill #0

## 2020-06-10 ENCOUNTER — Other Ambulatory Visit: Payer: Self-pay

## 2020-06-10 ENCOUNTER — Encounter (INDEPENDENT_AMBULATORY_CARE_PROVIDER_SITE_OTHER): Payer: Self-pay | Admitting: Bariatrics

## 2020-06-10 ENCOUNTER — Ambulatory Visit (INDEPENDENT_AMBULATORY_CARE_PROVIDER_SITE_OTHER): Payer: 59 | Admitting: Bariatrics

## 2020-06-10 VITALS — BP 120/81 | HR 79 | Temp 97.9°F | Ht 66.0 in | Wt 293.0 lb

## 2020-06-10 DIAGNOSIS — Z9189 Other specified personal risk factors, not elsewhere classified: Secondary | ICD-10-CM | POA: Diagnosis not present

## 2020-06-10 DIAGNOSIS — R7303 Prediabetes: Secondary | ICD-10-CM | POA: Diagnosis not present

## 2020-06-10 DIAGNOSIS — I10 Essential (primary) hypertension: Secondary | ICD-10-CM

## 2020-06-10 DIAGNOSIS — Z6841 Body Mass Index (BMI) 40.0 and over, adult: Secondary | ICD-10-CM

## 2020-06-10 DIAGNOSIS — E559 Vitamin D deficiency, unspecified: Secondary | ICD-10-CM

## 2020-06-10 NOTE — Progress Notes (Signed)
Chief Complaint:   OBESITY Michelle Huerta is here to discuss her progress with her obesity treatment plan along with follow-up of her obesity related diagnoses. Michelle Huerta is on the Category 3 Plan and states she is following her eating plan approximately 0% of the time. Michelle Huerta states she is exercising 0 minutes 0 times per week.  Today's visit was #: 27 Starting weight: 296 lbs Starting date: 05/30/2018 Today's weight: 293 lbs Today's date: 06/10/2020 Total lbs lost to date: 3 Total lbs lost since last in-office visit: 0  Interim History: Michelle Huerta is up 1 lb over the Thanksgiving holiday. She reports drinking more water.  Subjective:   Prediabetes. Michelle Huerta has a diagnosis of prediabetes based on her elevated HgA1c and was informed this puts her at greater risk of developing diabetes. She continues to work on diet and exercise to decrease her risk of diabetes. She denies nausea or hypoglycemia. Michelle Huerta is taking metformin.  Lab Results  Component Value Date   HGBA1C 5.8 (H) 05/19/2020   Lab Results  Component Value Date   INSULIN 32.9 (H) 05/19/2020   INSULIN 44.4 (H) 12/10/2019   INSULIN 36.3 (H) 06/24/2019   INSULIN 37.3 (H) 02/07/2019   INSULIN 41.4 (H) 05/30/2018   Vitamin D deficiency. Michelle Huerta is taking high dose Vitamin D supplementation.    Ref. Range 05/19/2020 08:22  Vitamin D, 25-Hydroxy Latest Ref Range: 30.0 - 100.0 ng/mL 44.3   Essential hypertension. Michelle Huerta is taking Micardis and HCTZ.  BP Readings from Last 3 Encounters:  06/10/20 120/81  05/19/20 135/86  04/07/20 138/89   Lab Results  Component Value Date   CREATININE 0.77 05/19/2020   CREATININE 0.73 12/10/2019   CREATININE 0.82 06/05/2019   At risk for hypoglycemia. Michelle Huerta is at increased risk for hypoglycemia due to prediabetes.  Assessment/Plan:   Prediabetes. Michelle Huerta will continue to work on weight loss, exercise, and decreasing simple carbohydrates to help decrease the risk of diabetes. She will continue metformin  as directed.   Vitamin D deficiency. Low Vitamin D level contributes to fatigue and are associated with obesity, breast, and colon cancer. She agrees to continue to take Vitamin D as directed and will follow-up for routine testing of Vitamin D, at least 2-3 times per year to avoid over-replacement.  Essential hypertension. Michelle Huerta is working on healthy weight loss and exercise to improve blood pressure control. We will watch for signs of hypotension as she continues her lifestyle modifications. She will continue her medications as directed and will decrease sodium in her diet.  At risk for hypoglycemia. Michelle Huerta was given approximately 15 minutes of counseling today regarding prevention of hypoglycemia. She was advised of symptoms of hypoglycemia. Michelle Huerta was instructed to avoid skipping meals, eat regular protein rich meals and schedule low calorie snacks as needed.   Repetitive spaced learning was employed today to elicit superior memory formation and behavioral change.   Class 3 severe obesity with serious comorbidity and body mass index (BMI) of 45.0 to 49.9 in adult, unspecified obesity type (Michelle Huerta).  Michelle Huerta is currently in the action stage of change. As such, her goal is to continue with weight loss efforts. She has agreed to practicing portion control and making smarter food choices, such as increasing vegetables and decreasing simple carbohydrates.   She will work on increasing her water intake.  We independently reviewed with the patient labs including CMP, Vitamin D, A1c, and insulin.  Exercise goals: Michelle Huerta will start walking at the track for exercise.  Behavioral  modification strategies: increasing lean protein intake, decreasing simple carbohydrates, increasing vegetables, increasing water intake, decreasing eating out, no skipping meals, meal planning and cooking strategies, keeping healthy foods in the home and planning for success.  Michelle Huerta has agreed to follow-up with our clinic in 2-3 weeks. She  was informed of the importance of frequent follow-up visits to maximize her success with intensive lifestyle modifications for her multiple health conditions.   Objective:   Blood pressure 120/81, pulse 79, temperature 97.9 F (36.6 C), height 5\' 6"  (1.676 m), weight 293 lb (132.9 kg), last menstrual period 08/01/2012, SpO2 100 %. Body mass index is 47.29 kg/m.  General: Cooperative, alert, well developed, in no acute distress. HEENT: Conjunctivae and lids unremarkable. Cardiovascular: Regular rhythm.  Lungs: Normal work of breathing. Neurologic: No focal deficits.   Lab Results  Component Value Date   CREATININE 0.77 05/19/2020   BUN 13 05/19/2020   NA 141 05/19/2020   K 4.0 05/19/2020   CL 104 05/19/2020   CO2 24 05/19/2020   Lab Results  Component Value Date   ALT 24 05/19/2020   AST 20 05/19/2020   ALKPHOS 99 05/19/2020   BILITOT 0.6 05/19/2020   Lab Results  Component Value Date   HGBA1C 5.8 (H) 05/19/2020   HGBA1C 5.8 (H) 12/10/2019   HGBA1C 5.4 06/24/2019   HGBA1C 5.5 02/07/2019   HGBA1C 5.9 (A) 09/10/2018   Lab Results  Component Value Date   INSULIN 32.9 (H) 05/19/2020   INSULIN 44.4 (H) 12/10/2019   INSULIN 36.3 (H) 06/24/2019   INSULIN 37.3 (H) 02/07/2019   INSULIN 41.4 (H) 05/30/2018   Lab Results  Component Value Date   TSH 2.630 12/10/2019   Lab Results  Component Value Date   CHOL 151 12/10/2019   HDL 66 12/10/2019   LDLCALC 67 12/10/2019   TRIG 98 12/10/2019   CHOLHDL 1.8 05/30/2018   Lab Results  Component Value Date   WBC 5.8 12/07/2018   HGB 12.7 12/07/2018   HCT 37.5 12/07/2018   MCV 80 12/07/2018   PLT 138 (L) 12/07/2018   No results found for: IRON, TIBC, FERRITIN  Attestation Statements:   Reviewed by clinician on day of visit: allergies, medications, problem list, medical history, surgical history, family history, social history, and previous encounter notes.  Migdalia Dk, am acting as Location manager for [employer], DO   I have reviewed the above documentation for accuracy and completeness, and I agree with the above. Jearld Lesch, DO

## 2020-07-01 ENCOUNTER — Ambulatory Visit (INDEPENDENT_AMBULATORY_CARE_PROVIDER_SITE_OTHER): Payer: 59 | Admitting: Bariatrics

## 2020-07-13 ENCOUNTER — Other Ambulatory Visit (HOSPITAL_COMMUNITY): Payer: Self-pay | Admitting: Physician Assistant

## 2020-07-17 MED FILL — FLUTICASONE PROP 50 MCG SPR: 50 | 30 days supply | Qty: 16 | Fill #2

## 2020-07-17 MED FILL — METFORMIN HCL 500 MG TABS: 500 | 30 days supply | Qty: 60 | Fill #0

## 2020-07-18 ENCOUNTER — Other Ambulatory Visit (HOSPITAL_COMMUNITY): Payer: Self-pay | Admitting: Family Medicine

## 2020-07-21 ENCOUNTER — Ambulatory Visit (INDEPENDENT_AMBULATORY_CARE_PROVIDER_SITE_OTHER): Payer: 59 | Admitting: Bariatrics

## 2020-08-08 MED FILL — METFORMIN HCL 500 MG TABS: 500 | 30 days supply | Qty: 60 | Fill #0

## 2020-08-21 ENCOUNTER — Other Ambulatory Visit (HOSPITAL_COMMUNITY): Payer: Self-pay | Admitting: Family Medicine

## 2020-08-21 MED FILL — VIT D2 1.25 MG (50,000 UNIT: 1.25 MG | 84 days supply | Qty: 12 | Fill #3

## 2020-08-21 MED FILL — TELMISARTAN 80 MG TABS: 80 | 90 days supply | Qty: 90 | Fill #0

## 2020-08-21 MED FILL — FLUTICASONE PROP 50 MCG SPR: 50 | 30 days supply | Qty: 16 | Fill #0

## 2020-08-24 ENCOUNTER — Other Ambulatory Visit: Payer: Self-pay

## 2020-08-24 ENCOUNTER — Ambulatory Visit
Admission: RE | Admit: 2020-08-24 | Discharge: 2020-08-24 | Disposition: A | Discharge status: Home / Self Care | Payer: 59 | Source: Ambulatory Visit | Attending: Family Medicine | Admitting: Family Medicine

## 2020-08-24 DIAGNOSIS — Z1231 Encounter for screening mammogram for malignant neoplasm of breast: Secondary | ICD-10-CM

## 2020-09-07 ENCOUNTER — Other Ambulatory Visit (HOSPITAL_COMMUNITY): Payer: Self-pay | Admitting: Family Medicine

## 2020-09-07 MED FILL — KETOCONAZOLE 2% CREAM: 2 | 14 days supply | Qty: 30 | Fill #1

## 2020-09-07 MED FILL — VALACYCLOVIR HCL 500 MG TAB: 500 | 90 days supply | Qty: 90 | Fill #1

## 2020-09-07 MED FILL — ALBUTEROL SULFATE HFA 108 (: 108 (90 BAS | 25 days supply | Qty: 18 | Fill #0

## 2020-09-07 MED FILL — AMLODIPINE BESYLATE 10 MG T: 10 | 90 days supply | Qty: 90 | Fill #1

## 2020-09-22 ENCOUNTER — Other Ambulatory Visit (HOSPITAL_COMMUNITY): Payer: Self-pay | Admitting: Family Medicine

## 2020-09-23 ENCOUNTER — Other Ambulatory Visit (HOSPITAL_COMMUNITY): Payer: Self-pay | Admitting: Family Medicine

## 2020-10-10 ENCOUNTER — Other Ambulatory Visit (HOSPITAL_COMMUNITY): Payer: Self-pay

## 2020-11-18 NOTE — Progress Notes (Signed)
Cardiology Office Note   Date:  11/19/2020   ID:  Michelle Huerta, DOB 09-Jun-1961, MRN [id number]  PCP:  Harlan Stains, MD  Cardiologist:   Skeet Latch, MD   Chief Complaint  Patient presents with  . Follow-up    History of Present Illness: Michelle Huerta is a 60 y.o. female with hypertension, morbid obesity, Lupus, and Paget's disease who presents follow up.  She was initially seen 06/2015 for evaluation prior to starting Belviq.  At that appointment she reported symptoms concerning for OSA and was referred for a sleep study.  She was found to have OSA and started on a CPAP.  She was referred for an echo 06/2015 that revealed LVEF 55-60% with grade 1 diastolic dysfunction.   Ms. Michelle Huerta stopped working due to chronic pain.  She is on disability.  She struggles with chronic knee pain.  She has difficulty using her CPAP due to sinus drainage. At her last appointment she reported atypical chest pain and was referred for a Coronary CTA but never had it. Today, she is feeling good overall. To stay busy, she tries to walk but it is difficult because she needs a knee replacement. She needs to lose 50 pounds before the procedure. She goes to the [facility] for formal exercise, however she states she does not go often enough. Her breathing is better overall, and only rarely needs to breathe harder, nothing that alarms her. She no longer has her CPAP linked to her smart phone due to phone network issues, and this has caused her to lose the motivation from seeing her sleep information. Mostly at night and occasionally after drinking alcohol, she notes LE edema around her ankles. She denies any chest pain, shortness of breath, or palpitations. No pre-syncope, syncope, or lightheadedness to note.   Past Medical History:  Diagnosis Date  . Allergic urticaria 07/02/2019  . Allergy   . Arthritis of both knees   . Asthma   . Bilateral chronic knee pain   . Chronic back pain   . Constipation   . DDD  (degenerative disc disease), cervical   . Depression    no meds  . Genital herpes    HSV type 2 at rectum, culture positive 3/15  . Glaucoma   . Grade I diastolic dysfunction 38/04/1750   noted on ECHO   . Heart murmur   . History of colon polyps   . HLD (hyperlipidemia)   . Hypertension   . Hypothyroidism   . Insomnia   . Internal hemorrhoids   . Lactose intolerance   . Lupus ([place])   . LVH (left ventricular hypertrophy) 06/25/2015   Mild, noted on ECHO   . Mild sleep apnea    1/17 CPAP Dr Claiborne Billings does not use cpap   . Morbid obesity ([place]) 06/11/2015  . Osteoporosis   . Paget's disease   . Paget's disease of bone    Dr Trudie Reed, Reclast insufion 02/14/10 - bad reaction to infusion, now tolerates fosamax weekly  . Plantar fasciitis, bilateral   . PMB (postmenopausal bleeding)   . Pre-diabetes   . Sleep apnea   . Thrombocytopenia ([place])   . TR (tricuspid regurgitation) 06/25/2015   Trace, noted on ECHO   . Tubular adenoma polyp of rectum    8/12, Dr Fuller Plan, 5 yr follow up   . Uterine fibroid 10/19/2017   noted on CT pelvis  . Vitamin D deficiency     Past Surgical History:  Procedure Laterality  Date  . AUGMENTATION MAMMAPLASTY Bilateral   . BREAST ENHANCEMENT SURGERY    . BREAST REDUCTION SURGERY    . COLONOSCOPY  01/19/2016  . DILATION AND CURETTAGE OF UTERUS N/A 06/24/2015   Procedure: DILATATION AND CURETTAGE;  Surgeon: Emily Filbert, MD;  Location: [facility];  Service: Gynecology;  Laterality: N/A;  . KNEE ARTHROSCOPY WITH SUBCHONDROPLASTY Right 05/03/2018   Procedure: RIGHT KNEE ARTHROSCOPY WITH DEBRIDEMENT, PARTIAL MEDIAL MENISCECTOMY SUBCHONDROPLASTY MEDIAL TIBIAL PLATEAU AND MEDIAL FEMORAL CONDYLE;  Surgeon: Mcarthur Rossetti, MD;  Location: [facility];  Service: Orthopedics;  Laterality: Right;  . REDUCTION MAMMAPLASTY Bilateral   . TOE SURGERY     removal of bone in each foot  . TUBAL LIGATION       Current Outpatient Medications  Medication Sig Dispense Refill   . albuterol (VENTOLIN HFA) 108 (90 Base) MCG/ACT inhaler INHALE 2 PUFFS INTO THE LUNGS EVERY 6 HOURS AS NEEDED FOR WHEEZING OR SHORTNESS OF BREATH. 18 g 0  . amLODipine (NORVASC) 10 MG tablet TAKE 1 TABLET BY MOUTH ONCE A DAY 90 tablet 1  . fluticasone (FLONASE) 50 MCG/ACT nasal spray Place 2 sprays into both nostrils daily. 16 g 6  . ibuprofen (ADVIL) 800 MG tablet TAKE 1 TABLET (800 MG TOTAL) BY MOUTH EVERY 8 HOURS AS NEEDED. 90 tablet 0  . ketoconazole (NIZORAL) 2 % cream APPLY TOPICALLY TO AFFECTED AREA TWICE DAILY AS NEEDED. 30 g 0  . MELATONIN PO Take by mouth.    . metFORMIN (GLUCOPHAGE) 500 MG tablet TAKE 1 TABLET (500 MG TOTAL) BY MOUTH 2 (TWO) TIMES DAILY WITH A MEAL. 60 tablet 0  . telmisartan (MICARDIS) 80 MG tablet TAKE 1 TABLET BY MOUTH DAILY. 90 tablet 0  . valACYclovir (VALTREX) 500 MG tablet TAKE 1 TABLET BY MOUTH DAILY. 90 tablet 0  . Vitamin D, Ergocalciferol, (DRISDOL) 1.25 MG (50000 UNIT) CAPS capsule Take 1 capsule (50,000 Units total) by mouth every 7 (seven) days. 4 capsule 0  . zolpidem (AMBIEN) 10 MG tablet Take 10 mg by mouth at bedtime as needed for sleep.     No current facility-administered medications for this visit.    Allergies:   Penicillins and Reclast [zoledronic acid]    Social History:  The patient  reports that she quit smoking about 12 years ago. Her smoking use included cigars. She quit after 7.00 years of use. She has never used smokeless tobacco. She reports current alcohol use of about 5.0 standard drinks of alcohol per week. She reports that she does not use drugs.   Family History:  The patient's family history includes Allergic rhinitis in her mother; Cancer in her maternal grandmother; Heart disease in her mother; Hypertension in her father, mother, sister, son, and son; Stomach cancer in her paternal grandmother.    ROS:   Please see the history of present illness. (+) LE edema, mostly around ankles All other systems are reviewed and  negative.    PHYSICAL EXAM: VS:  BP 128/80 (BP Location: Left Arm, Patient Position: Sitting, Cuff Size: Large)   Pulse 88   Ht 5\' 7"  (1.702 m)   Wt 293 lb (132.9 kg)   LMP 08/01/2012   BMI 45.89 kg/m  , BMI Body mass index is 45.89 kg/m. GENERAL:  Well appearing.  No acute distress.  HEENT:  Pupils equal round and reactive, fundi not visualized, oral mucosa unremarkable NECK:  No jugular venous distention, waveform within normal limits, carotid upstroke brisk and symmetric, no bruits LUNGS:  Clear to auscultation bilaterally.  No crackles, wheezes or rhonci HEART:  RRR.  PMI not displaced or sustained,S1 and S2 within normal limits, no S3, no S4, no clicks, no rubs, no murmurs ABD:  Flat, positive bowel sounds normal in frequency in pitch, no bruits, no rebound, no guarding, no midline pulsatile mass, no hepatomegaly, no splenomegaly.  Obese EXT:  2 plus pulses throughout, no edema, no cyanosis no clubbing SKIN:  No rashes no nodules NEURO:  Cranial nerves II through XII grossly intact, motor grossly intact throughout PSYCH:  Cognitively intact, oriented to person place and time   EKG:   11/19/2020: Sinus rhythm. Rate 88 bpm. 11/19/19: Sinus rhythm.  Rate 78 bpm 01/16/2017: Sinus rhythm. Rate 75 bpm. 06/11/2015: Sinus rhythm. Rate 83 bpm. Early R wave progression.  Echo 01/31/2020: 1. Left ventricular ejection fraction, by estimation, is 60 to 65%. The  left ventricle has normal function. The left ventricle has no regional  wall motion abnormalities. Left ventricular diastolic parameters are  consistent with Grade I diastolic  dysfunction (impaired relaxation).  2. Right ventricular systolic function is normal. The right ventricular  size is normal. Tricuspid regurgitation signal is inadequate for assessing  PA pressure.  3. The mitral valve is normal in structure. No evidence of mitral valve  regurgitation. No evidence of mitral stenosis.  4. The aortic valve is normal in  structure. Aortic valve regurgitation is  not visualized. No aortic stenosis is present.   Myoview Lexiscan 01/07/2020:  The left ventricular ejection fraction is mildly decreased (45-54%).  Nuclear stress EF: 49%.  There was no ST segment deviation noted during stress.  This is an intermediate risk study.  Inferior hypokinesis noted. Consider echo to better assess systolic function.  There is no evidence of ischemia.  Echo 06/25/15: Study Conclusions  - Left ventricle: The cavity size was normal. Wall thickness was   increased in a pattern of mild LVH. Systolic function was normal.   The estimated ejection fraction was in the range of 55% to 60%.   Regional wall motion abnormalities cannot be excluded. Doppler   parameters are consistent with abnormal left ventricular   relaxation (grade 1 diastolic dysfunction).  Impressions:  - Technically difficult; normal LV systolic function; grade 1   diastolic dysfunction; mild LVH; trace TR.  Recent Labs: 12/10/2019: TSH 2.630 05/19/2020: ALT 24; BUN 13; Creatinine, Ser 0.77; Potassium 4.0; Sodium 141    Lipid Panel    Component Value Date/Time   CHOL 151 12/10/2019 1046   TRIG 98 12/10/2019 1046   HDL 66 12/10/2019 1046   CHOLHDL 1.8 05/30/2018 1116   LDLCALC 67 12/10/2019 1046   03/25/15: WBC 5.5, hematocrit 36.7, platelets 138 Sodium 141, potassium 3.7, BUN 15, creatinine 0.79 AST 17 ALT 18 TSH 1.33 Total cholesterol 150, triglycerides 95, HDL 70, LDL61  Wt Readings from Last 3 Encounters:  11/19/20 293 lb (132.9 kg)  06/10/20 293 lb (132.9 kg)  05/19/20 292 lb (132.5 kg)      ASSESSMENT AND PLAN: Benign essential HTN Blood pressures well-controlled on amlodipine and telmisartan.  We will refer her to the PREP exercise program through the [facility].  OSA on CPAP Continue CPAP.  Morbid obesity ([place]) Continue working on diet and exercise.  We will refer her to the PREP exercise program to the [facility].   Current  medicines are reviewed at length with the patient today.  The patient does not have concerns regarding medicines.  The following changes have been made:  no change  Labs/ tests ordered today include:   Orders Placed This Encounter  Procedures  . EKG 12-Lead     Disposition:   FU with Liliahna Cudd C. Oval Linsey, MD, [facility] in 1 year.  I,Mathew Stumpf,acting as a Education administrator for Skeet Latch, MD.,have documented all relevant documentation on the behalf of Skeet Latch, MD,as directed by  Skeet Latch, MD while in the presence of Skeet Latch, MD.  I, Wayne Oval Linsey, MD have reviewed all documentation for this visit.  The documentation of the exam, diagnosis, procedures, and orders on 11/19/2020 are all accurate and complete.   Signed, Eulene Pekar C. Oval Linsey, MD, [facility]  11/19/2020 11:41 AM    [place]

## 2020-11-19 ENCOUNTER — Telehealth: Payer: Self-pay

## 2020-11-19 ENCOUNTER — Ambulatory Visit: Payer: Medicare Other | Admitting: Cardiovascular Disease

## 2020-11-19 ENCOUNTER — Other Ambulatory Visit: Payer: Self-pay

## 2020-11-19 ENCOUNTER — Encounter: Payer: Self-pay | Admitting: Cardiovascular Disease

## 2020-11-19 DIAGNOSIS — G4733 Obstructive sleep apnea (adult) (pediatric): Secondary | ICD-10-CM

## 2020-11-19 DIAGNOSIS — Z9989 Dependence on other enabling machines and devices: Secondary | ICD-10-CM

## 2020-11-19 DIAGNOSIS — I10 Essential (primary) hypertension: Secondary | ICD-10-CM | POA: Diagnosis not present

## 2020-11-19 NOTE — Telephone Encounter (Signed)
LVMT pt requesting call back to discuss referral to PREP 

## 2020-11-19 NOTE — Assessment & Plan Note (Signed)
Blood pressures well-controlled on amlodipine and telmisartan.  We will refer her to the PREP exercise program through the [facility].

## 2020-11-19 NOTE — Assessment & Plan Note (Signed)
Continue CPAP.  

## 2020-11-19 NOTE — Assessment & Plan Note (Signed)
Continue working on diet and exercise.  We will refer her to the PREP exercise program to the [facility].

## 2020-11-19 NOTE — Patient Instructions (Signed)
Medication Instructions:  Your physician recommends that you continue on your current medications as directed. Please refer to the Current Medication list given to you today.   *If you need a refill on your cardiac medications before your next appointment, please call your pharmacy*  Lab Work: NONE   Testing/Procedures: NONE   Follow-Up: At [facility], you and your health needs are our priority.  As part of our continuing mission to provide you with exceptional heart care, we have created designated Provider Care Teams.  These Care Teams include your primary Cardiologist (physician) and Advanced Practice Providers (APPs -  Physician Assistants and Nurse Practitioners) who all work together to provide you with the care you need, when you need it.  We recommend signing up for the patient portal called "MyChart".  Sign up information is provided on this After Visit Summary.  MyChart is used to connect with patients for Virtual Visits (Telemedicine).  Patients are able to view lab/test results, encounter notes, upcoming appointments, etc.  Non-urgent messages can be sent to your provider as well.   To learn more about what you can do with MyChart, go to [email or web address].    Your next appointment:   12 month(s)  The format for your next appointment:   In Person  Provider:   DR [place] PA AT [place]    Other Instructions  SOMEONE FROM THE PREP ([facility]) TEAM WILL BE IN [place]

## 2020-11-26 ENCOUNTER — Encounter: Payer: Self-pay | Admitting: Podiatry

## 2020-11-26 ENCOUNTER — Other Ambulatory Visit: Payer: Self-pay

## 2020-11-26 ENCOUNTER — Ambulatory Visit (INDEPENDENT_AMBULATORY_CARE_PROVIDER_SITE_OTHER): Payer: Medicare Other

## 2020-11-26 ENCOUNTER — Ambulatory Visit: Payer: Medicare Other | Admitting: Podiatry

## 2020-11-26 DIAGNOSIS — M79675 Pain in left toe(s): Secondary | ICD-10-CM

## 2020-11-26 DIAGNOSIS — M2041 Other hammer toe(s) (acquired), right foot: Secondary | ICD-10-CM

## 2020-11-26 DIAGNOSIS — M779 Enthesopathy, unspecified: Secondary | ICD-10-CM

## 2020-11-26 DIAGNOSIS — M79674 Pain in right toe(s): Secondary | ICD-10-CM

## 2020-11-26 DIAGNOSIS — M2042 Other hammer toe(s) (acquired), left foot: Secondary | ICD-10-CM

## 2020-11-26 MED ORDER — TRIAMCINOLONE ACETONIDE 10 MG/ML IJ SUSP
20.0000 mg | Freq: Once | INTRAMUSCULAR | Status: AC
  Administered 2020-11-26: 20 mg

## 2020-11-26 NOTE — Progress Notes (Signed)
Subjective:   Patient ID: Michelle Huerta, female   DOB: 60 y.o.   MRN: [id number]   HPI Patient presents with pain in the dorsum of both feet stating that its been sore for the last several months and that she cannot do a lot of walking and has moderate obesity and is not currently smoking.  Patient states the pain does become quite tender and patient does try to be active but is due to have knee replacement if she can lose weight   Review of Systems  All other systems reviewed and are negative.       Objective:  Physical Exam Vitals and nursing note reviewed.  Constitutional:      Appearance: She is well-developed.  Pulmonary:     Effort: Pulmonary effort is normal.  Musculoskeletal:        General: Normal range of motion.  Skin:    General: Skin is warm.  Neurological:     Mental Status: She is alert.     Neurovascular status intact muscle strength adequate range of motion adequate patient found to have inflammation around the second MPJ both feet with pain upon palpation and discomfort with pressure.  Patient has good digital perfusion well oriented x3     Assessment:  Inflammatory capsulitis second MPJ bilateral with moderate hammertoe deformity contributing and obesity contributing     Plan:  H&P x-rays reviewed condition discussed.  At this time I did sterile prep and injected periarticular second third MPJs bilateral 3 mg Dexasone Kenalog 5 mg Xylocaine and went ahead and advised on rigid bottom shoes.  Reappoint to recheck as needed  X-rays indicate there is short first metatarsal bilateral no indications of arthritis or advanced deformity or stress fracture

## 2020-12-01 ENCOUNTER — Telehealth: Payer: Self-pay

## 2020-12-01 NOTE — Telephone Encounter (Signed)
Pt returned call Explained PREP program Can do a daytime class starting 12/22/20 1pm to 215pm Intake scheduled for 12/22/20 at 2pm

## 2020-12-23 NOTE — Progress Notes (Signed)
[facility] PREP Progress Report   Patient Details  Name: Michelle Huerta MRN: [id number] Date of Birth: 04-12-1961 Age: 60 y.o. PCP: Harlan Stains, MD  Vitals:   12/22/20 1400  BP: 128/90  Pulse: 83  SpO2: 98%  Weight: 290 lb 12.8 oz (131.9 kg)  Height: 5\' 7"  (1.702 m)      [facility] Eval - 12/23/20 1100       Referral    Referring Provider Oval Linsey    Reason for referral Obesitity/Overweight;Inactivity;Hypertension;Other;Orthopedic   PREdiabetic, Needs BMI lower to have surgery   Program Start Date 12/29/20   T/TH 1pm-215pm x 12 wks at [facility]   Waist Circumference 55 inches    Hip Circumference 56.5 inches    Body fat --   to high to measure     Information for Trainer   Goals Lose 30 lbs BMI goal 40    Current Exercise none    Orthopedic Concerns R knee, needs TKR, OA    Pertinent Medical History Pagets, lupus, HTN, irreg HR    Current Barriers maybe appts    Restrictions/Precautions --   Knee pain   Medications that affect exercise Medication causing dizziness/drowsiness      Timed Up and Go (TUGS)   Timed Up and Go Low risk <9 seconds      Mobility and Daily Activities   I find it easy to walk up or down two or more flights of stairs. 3    I have no trouble taking out the trash. 4    I do housework such as vacuuming and dusting on my own without difficulty. 4    I can easily lift a gallon of milk (8lbs). 4    I can easily walk a mile. 1    I have no trouble reaching into high cupboards or reaching down to pick up something from the floor. 1    I do not have trouble doing out-door work such as Armed forces logistics/support/administrative officer, raking leaves, or gardening. 2      Mobility and Daily Activities   I feel younger than my age. 1    I feel independent. 4    I feel energetic. 1    I live an active life.  1    I feel strong. 2    I feel healthy. 1    I feel active as other people my age. 1      How fit and strong are you.   Fit and Strong Total Score 30             Past Medical History:  Diagnosis Date   Allergic urticaria 07/02/2019   Allergy    Arthritis of both knees    Asthma    Bilateral chronic knee pain    Chronic back pain    Constipation    DDD (degenerative disc disease), cervical    Depression    no meds   Genital herpes    HSV type 2 at rectum, culture positive 3/15   Glaucoma    Grade I diastolic dysfunction 40/98/1191   noted on ECHO    Heart murmur    History of colon polyps    HLD (hyperlipidemia)    Hypertension    Hypothyroidism    Insomnia    Internal hemorrhoids    Lactose intolerance    Lupus ([facility])    LVH (left ventricular hypertrophy) 06/25/2015   Mild, noted on ECHO  Mild sleep apnea    1/17 CPAP Dr Claiborne Billings does not use cpap    Morbid obesity ([place]) 06/11/2015   Osteoporosis    Paget's disease    Paget's disease of bone    Dr Trudie Reed, Reclast insufion 02/14/10 - bad reaction to infusion, now tolerates fosamax weekly   Plantar fasciitis, bilateral    PMB (postmenopausal bleeding)    Pre-diabetes    Sleep apnea    Thrombocytopenia ([facility])    TR (tricuspid regurgitation) 06/25/2015   Trace, noted on ECHO    Tubular adenoma polyp of rectum    8/12, Dr Fuller Plan, 5 yr follow up    Uterine fibroid 10/19/2017   noted on CT pelvis   Vitamin D deficiency    Past Surgical History:  Procedure Laterality Date   AUGMENTATION MAMMAPLASTY Bilateral    BREAST ENHANCEMENT SURGERY     BREAST REDUCTION SURGERY     COLONOSCOPY  01/19/2016   DILATION AND CURETTAGE OF UTERUS N/A 06/24/2015   Procedure: DILATATION AND CURETTAGE;  Surgeon: Emily Filbert, MD;  Location: [facility];  Service: Gynecology;  Laterality: N/A;   KNEE ARTHROSCOPY WITH SUBCHONDROPLASTY Right 05/03/2018   Procedure: RIGHT KNEE ARTHROSCOPY WITH DEBRIDEMENT, PARTIAL MEDIAL MENISCECTOMY SUBCHONDROPLASTY MEDIAL TIBIAL PLATEAU AND MEDIAL FEMORAL CONDYLE;  Surgeon: Mcarthur Rossetti, MD;  Location: [facility];  Service: Orthopedics;  Laterality: Right;    REDUCTION MAMMAPLASTY Bilateral    TOE SURGERY     removal of bone in each foot   TUBAL LIGATION     Social History   Tobacco Use  Smoking Status Former   Pack years: 0.00   Types: Cigars   Quit date: 08/11/2008   Years since quitting: 12.3  Smokeless Tobacco Never  Tobacco Comments   smoked Black&Milds, 1 pack per day (5 in a pack)    Would benefit from a low inflammatory diet   Barnett Hatter 12/23/2020, 11:08 AM

## 2020-12-29 NOTE — Progress Notes (Signed)
[facility] PREP Weekly Session   Patient Details  Name: Michelle Huerta MRN: [id number] Date of Birth: June 21, 1961 Age: 60 y.o. PCP: Harlan Stains, MD  There were no vitals filed for this visit.   [facility] Weekly seesion - 12/29/20 1700       Weekly Session   Topic Discussed Goal setting and welcome to the program   scale of perceived exertion reviewed   Classes attended to date [place] 12/29/2020, 5:21 PM

## 2021-01-05 NOTE — Progress Notes (Signed)
[facility] PREP Weekly Session   Patient Details  Name: Michelle Huerta MRN: [id number] Date of Birth: [birth year] Age: 60 y.o. PCP: Harlan Stains, MD  Vitals:   01/05/21 1501  Weight: 293 lb 6.4 oz (133.1 kg)     [facility] Weekly seesion - 01/05/21 1500       Weekly Session   Topic Discussed Importance of resistance training;Other ways to be active    Classes attended to date 3            Class held at [facility] 01/05/2021, 3:02 PM

## 2021-01-12 NOTE — Progress Notes (Signed)
[facility] PREP Weekly Session   Patient Details  Name: Michelle Huerta MRN: [id number] Date of Birth: 1961-01-16 Age: 60 y.o. PCP: Harlan Stains, MD  Vitals:   01/12/21 1508  Weight: 295 lb (133.8 kg)     [facility] Weekly seesion - 01/12/21 1500       Weekly Session   Topic Discussed Healthy eating tips   Keep added sugar below 24 g / day   Minutes exercised this week 30 minutes    Classes attended to date 4            Class held at [facility] 01/12/2021, 3:10 PM

## 2021-01-19 ENCOUNTER — Telehealth: Payer: Self-pay | Admitting: Gastroenterology

## 2021-01-19 NOTE — Telephone Encounter (Signed)
Inbound call from patient requesting to schedule colonoscopy.  Informed patient not due until 2024.  Stated she was under the impression she needed to have it 2022 and is requesting to speak with a nurse to further discuss.

## 2021-01-19 NOTE — Telephone Encounter (Signed)
Left message for patient to call back Recall changed at [facility] 03/14/19 for 7 years. Due 01/2023.

## 2021-01-19 NOTE — Progress Notes (Signed)
[facility] PREP Weekly Session   Patient Details  Name: Michelle Huerta MRN: [id number] Date of Birth: Sep 02, 1960 Age: 60 y.o. PCP: Harlan Stains, MD  Vitals:   01/19/21 1446  Weight: 295 lb 9.6 oz (134.1 kg)     [facility] Weekly seesion - 01/19/21 1400       Weekly Session   Topic Discussed Health habits;Water    Minutes exercised this week 80 minutes    Classes attended to date 6           Class at [facility] 01/19/2021, 2:47 PM

## 2021-01-20 NOTE — Telephone Encounter (Signed)
Left message for patient to call back  

## 2021-01-21 NOTE — Telephone Encounter (Signed)
I left a detailed message for the patient with new recall date based on [facility] 03/14/19.  She is asked to call back for any additional questions or concerns.

## 2021-01-26 NOTE — Progress Notes (Signed)
[facility] PREP Weekly Session   Patient Details  Name: Michelle Huerta MRN: [id number] Date of Birth: 16-Feb-1961 Age: 60 y.o. PCP: Harlan Stains, MD  Vitals:   01/26/21 1541  Weight: 295 lb 9.6 oz (134.1 kg)     [facility] Weekly seesion - 01/26/21 1500       Weekly Session   Topic Discussed Restaurant Eating   sugar demo   Minutes exercised this week 30 minutes   plus 30 for weights   Classes attended to date 7            Class held at [facility] 01/26/2021, 3:42 PM

## 2021-02-16 NOTE — Progress Notes (Signed)
Has missed 5 classes due to knee pain  Text pt to find out status. Is doing better however will not return to class until 02/23/21.

## 2021-02-19 ENCOUNTER — Other Ambulatory Visit: Payer: Self-pay | Admitting: Family Medicine

## 2021-02-19 DIAGNOSIS — R1010 Upper abdominal pain, unspecified: Secondary | ICD-10-CM

## 2021-03-04 ENCOUNTER — Ambulatory Visit
Admission: RE | Admit: 2021-03-04 | Discharge: 2021-03-04 | Disposition: A | Discharge status: Home / Self Care | Payer: Medicare Other | Source: Ambulatory Visit | Attending: Family Medicine | Admitting: Family Medicine

## 2021-03-04 DIAGNOSIS — R1010 Upper abdominal pain, unspecified: Secondary | ICD-10-CM

## 2021-03-18 ENCOUNTER — Ambulatory Visit: Payer: Medicare Other | Admitting: Gastroenterology

## 2021-03-18 ENCOUNTER — Encounter: Payer: Self-pay | Admitting: Gastroenterology

## 2021-03-18 VITALS — BP 124/74 | HR 72 | Ht 66.5 in | Wt 284.1 lb

## 2021-03-18 DIAGNOSIS — K219 Gastro-esophageal reflux disease without esophagitis: Secondary | ICD-10-CM | POA: Diagnosis not present

## 2021-03-18 DIAGNOSIS — Z860101 Personal history of adenomatous and serrated colon polyps: Secondary | ICD-10-CM

## 2021-03-18 DIAGNOSIS — Z86010 Personal history of colonic polyps: Secondary | ICD-10-CM | POA: Diagnosis not present

## 2021-03-18 DIAGNOSIS — D509 Iron deficiency anemia, unspecified: Secondary | ICD-10-CM

## 2021-03-18 MED ORDER — PLENVU 140 G PO SOLR: 1.0000 | Freq: Once | ORAL | 0 refills | Status: AC

## 2021-03-18 NOTE — Progress Notes (Signed)
    History of Present Illness: This is a 60 year old female with new iron deficiency anemia. She has family history of colon polyps (mother), a personal history of one colon tubular adenoma in 2012 and a cecal AVM on colonoscopy in 2012.  She was recently found to have a mild anemia, hemoglobin=11.9 iron saturation=16% and iron=51.  She had an episode of abdominal pain after eating pizza her abdominal pain promptly resolved.  Abdominal ultrasound was performed on March 04, 2021 showing gallstones without biliary dilatation or findings of acute cholecystitis.  She has not had a recurrence of abdominal pain.  Abdominal ultrasound also showed changes of hepatic steatosis and a 1 cm hypoechoic area in the left lobe suspicious for vascular malformation.  Current Medications, Allergies, Past Medical History, Past Surgical History, Family History and Social History were reviewed in [employer] record.   Physical Exam: General: Well developed, well nourished, no acute distress Head: Normocephalic and atraumatic Eyes: Sclerae anicteric, EOMI Ears: Normal auditory acuity Mouth: Not examined, mask on during Covid-19 pandemic Lungs: Clear throughout to auscultation Heart: Regular rate and rhythm; no murmurs, rubs or bruits Abdomen: Soft, non tender and non distended. No masses, hepatosplenomegaly or hernias noted. Normal Bowel sounds Rectal: Deferred to colonoscopy Musculoskeletal: Symmetrical with no gross deformities  Pulses:  Normal pulses noted Extremities: No clubbing, cyanosis, edema or deformities noted Neurological: Alert oriented x 4, grossly nonfocal Psychological:  Alert and cooperative. Normal mood and affect   Assessment and Recommendations:  IDA. Potential source is known colonic AVM.  Personal history of a tubular adenoma in 2012.  Family history of colon polyps, first-degree relative (mother). GERD.  R/O colorectal neoplasms, ulcer, gastritis, esophagitis and  other disorders. FeSO4 325 mg po qd and hold 5 days prior to colonoscopy prep.  Follow antireflux measures.  Continue omeprazole 40 mg daily.  Schedule colonoscopy and EGD. The risks (including bleeding, perforation, infection, missed lesions, medication reactions and possible hospitalization or surgery if complications occur), benefits, and alternatives to colonoscopy with possible biopsy and possible polypectomy were discussed with the patient and they consent to proceed.  The risks (including bleeding, perforation, infection, missed lesions, medication reactions and possible hospitalization or surgery if complications occur), benefits, and alternatives to endoscopy with possible biopsy and possible dilation were discussed with the patient and they consent to proceed. Cholelithiasis, asymptomatic. OSA on CPAP.   BMI=45.17.

## 2021-03-18 NOTE — Patient Instructions (Signed)
You have been scheduled for an endoscopy and colonoscopy. Please follow the written instructions given to you at your visit today. Please pick up your prep supplies at the pharmacy within the next 1-3 days. If you use inhalers (even only as needed), please bring them with you on the day of your procedure.  The [facility] providers would like to encourage you to use MYCHART to communicate with providers for non-urgent requests or questions.  Due to long hold times on the telephone, sending your provider a message by MYCHART may be a faster and more efficient way to get a response.  Please allow 48 business hours for a response.  Please remember that this is for non-urgent requests.   Due to recent changes in healthcare laws, you may see the results of your imaging and laboratory studies on MyChart before your provider has had a chance to review them.  We understand that in some cases there may be results that are confusing or concerning to you. Not all laboratory results come back in the same time frame and the provider may be waiting for multiple results in order to interpret others.  Please give us 48 hours in order for your provider to thoroughly review all the results before contacting the office for clarification of your results.   Thank you for choosing me and [facility].  Malcolm T. Stark, Jr., MD., FACG  

## 2021-03-19 ENCOUNTER — Telehealth: Payer: Self-pay | Admitting: Gastroenterology

## 2021-03-22 NOTE — Telephone Encounter (Signed)
Patient states even after the coupon her prep is $60 and she thought we could possibly have a sample in the office. Informed patient we do not have samples at the moment but we should be getting some before her procedure. Informed patient I will contact her if we get a free sample in the office.

## 2021-04-07 ENCOUNTER — Encounter: Payer: Self-pay | Admitting: Gastroenterology

## 2021-04-07 ENCOUNTER — Ambulatory Visit (AMBULATORY_SURGERY_CENTER): Payer: Medicare Other | Admitting: Gastroenterology

## 2021-04-07 ENCOUNTER — Other Ambulatory Visit: Payer: Self-pay

## 2021-04-07 VITALS — BP 132/64 | HR 78 | Temp 97.8°F | Resp 14 | Ht 66.0 in | Wt 284.0 lb

## 2021-04-07 DIAGNOSIS — K2970 Gastritis, unspecified, without bleeding: Secondary | ICD-10-CM | POA: Diagnosis not present

## 2021-04-07 DIAGNOSIS — Z86010 Personal history of colonic polyps: Secondary | ICD-10-CM

## 2021-04-07 DIAGNOSIS — K2980 Duodenitis without bleeding: Secondary | ICD-10-CM

## 2021-04-07 DIAGNOSIS — K31A0 Gastric intestinal metaplasia, unspecified: Secondary | ICD-10-CM

## 2021-04-07 DIAGNOSIS — K5730 Diverticulosis of large intestine without perforation or abscess without bleeding: Secondary | ICD-10-CM | POA: Diagnosis not present

## 2021-04-07 DIAGNOSIS — K5520 Angiodysplasia of colon without hemorrhage: Secondary | ICD-10-CM

## 2021-04-07 DIAGNOSIS — K269 Duodenal ulcer, unspecified as acute or chronic, without hemorrhage or perforation: Secondary | ICD-10-CM

## 2021-04-07 DIAGNOSIS — D120 Benign neoplasm of cecum: Secondary | ICD-10-CM

## 2021-04-07 DIAGNOSIS — K640 First degree hemorrhoids: Secondary | ICD-10-CM | POA: Diagnosis not present

## 2021-04-07 DIAGNOSIS — D125 Benign neoplasm of sigmoid colon: Secondary | ICD-10-CM

## 2021-04-07 DIAGNOSIS — D509 Iron deficiency anemia, unspecified: Secondary | ICD-10-CM

## 2021-04-07 DIAGNOSIS — K635 Polyp of colon: Secondary | ICD-10-CM | POA: Diagnosis not present

## 2021-04-07 DIAGNOSIS — B9681 Helicobacter pylori [H. pylori] as the cause of diseases classified elsewhere: Secondary | ICD-10-CM

## 2021-04-07 DIAGNOSIS — Z8371 Family history of colonic polyps: Secondary | ICD-10-CM

## 2021-04-07 MED ORDER — SODIUM CHLORIDE 0.9 % IV SOLN
500.0000 mL | Freq: Once | INTRAVENOUS | Status: DC
Start: 2021-04-07 — End: 2021-04-07

## 2021-04-07 NOTE — Op Note (Signed)
[facility] Patient Name: Michelle Huerta Procedure Date: 04/07/2021 1:14 PM MRN: [id number] Endoscopist: Ladene Artist , MD Age: 60 Referring MD:  Date of Birth: 1960/08/28 Gender: Female Account #: [id number] Procedure:                Upper GI endoscopy Indications:              Unexplained iron deficiency anemia Medicines:                Monitored Anesthesia Care Procedure:                Pre-Anesthesia Assessment:                           - Prior to the procedure, a History and Physical                            was performed, and patient medications and                            allergies were reviewed. The patient's tolerance of                            previous anesthesia was also reviewed. The risks                            and benefits of the procedure and the sedation                            options and risks were discussed with the patient.                            All questions were answered, and informed consent                            was obtained. Prior Anticoagulants: The patient has                            taken no previous anticoagulant or antiplatelet                            agents. ASA Grade Assessment: III - A patient with                            severe systemic disease. After reviewing the risks                            and benefits, the patient was deemed in                            satisfactory condition to undergo the procedure.                           After obtaining informed consent, the endoscope was  passed under direct vision. Throughout the                            procedure, the patient's blood pressure, pulse, and                            oxygen saturations were monitored continuously. The                            GIF [id number] #[id number] was introduced through the                            mouth, and advanced to the second part of duodenum.                            The upper GI endoscopy  was accomplished without                            difficulty. The patient tolerated the procedure                            well. Scope In: Scope Out: Findings:                 The examined esophagus was normal.                           Patchy mildly erythematous mucosa without bleeding                            was found in the entire examined stomach. Biopsies                            were taken with a cold forceps for histology.                           The exam of the stomach was otherwise normal.                           A single 5 mm mucosal nodule with a localized                            distribution was found in the duodenal bulb                            immediately distal to the pylorus. Photo did not                            take. Biopsies were taken with a cold forceps for                            histology.                           A mild post-ulcer deformity was  found in the                            duodenal bulb.                           The exam of the duodenum was otherwise normal. Complications:            No immediate complications. Estimated Blood Loss:     Estimated blood loss was minimal. Impression:               - Normal esophagus.                           - Erythematous mucosa in the stomach. Biopsied.                           - Mucosal nodule found in the duodenum. Biopsied.                           - Duodenal deformity. Recommendation:           - Patient has a contact number available for                            emergencies. The signs and symptoms of potential                            delayed complications were discussed with the                            patient. Return to normal activities tomorrow.                            Written discharge instructions were provided to the                            patient.                           - Resume previous diet.                           - Continue present medications.                            - Await pathology results. Ladene Artist, MD 04/07/2021 2:16:55 PM This report has been signed electronically.

## 2021-04-07 NOTE — Progress Notes (Signed)
Pt in recovery with monitors in place, VSS. Report given to receiving RN. Bite guard was placed with pt awake to ensure comfort. No dental or soft tissue damage noted. 

## 2021-04-07 NOTE — Op Note (Signed)
[facility] Patient Name: Michelle Huerta Procedure Date: 04/07/2021 1:14 PM MRN: [id number] Endoscopist: Ladene Artist , MD Age: 60 Referring MD:  Date of Birth: 21-Apr-1961 Gender: Female Account #: [id number] Procedure:                Colonoscopy Indications:              Unexplained iron deficiency anemia, Personal                            history of adenomatous colon polyps, Family history                            of colon polyps. Medicines:                Monitored Anesthesia Care Procedure:                Pre-Anesthesia Assessment:                           - Prior to the procedure, a History and Physical                            was performed, and patient medications and                            allergies were reviewed. The patient's tolerance of                            previous anesthesia was also reviewed. The risks                            and benefits of the procedure and the sedation                            options and risks were discussed with the patient.                            All questions were answered, and informed consent                            was obtained. Prior Anticoagulants: The patient has                            taken no previous anticoagulant or antiplatelet                            agents. ASA Grade Assessment: III - A patient with                            severe systemic disease. After reviewing the risks                            and benefits, the patient was deemed in  satisfactory condition to undergo the procedure.                           After obtaining informed consent, the colonoscope                            was passed under direct vision. Throughout the                            procedure, the patient's blood pressure, pulse, and                            oxygen saturations were monitored continuously. The                            CF HQ190L #[id number] was introduced through the  anus                            and advanced to the the cecum, identified by                            appendiceal orifice and ileocecal valve. The                            ileocecal valve, appendiceal orifice, and rectum                            were photographed. The quality of the bowel                            preparation was adequate after moderate lavage,                            suction. The colonoscopy was performed without                            difficulty. The patient tolerated the procedure                            well. Scope In: 1:44:31 PM Scope Out: 2:00:43 PM Scope Withdrawal Time: 0 hours 13 minutes 22 seconds  Total Procedure Duration: 0 hours 16 minutes 12 seconds  Findings:                 The perianal and digital rectal examinations were                            normal.                           Two sessile polyps were found in the sigmoid colon                            and ileocecal valve. The polyps were 6 to 9 mm in  size. These polyps were removed with a cold snare.                            Resection and retrieval were complete.                           A single medium-sized localized angiodysplastic                            lesion without bleeding was found in the cecum.                           A few small-mouthed diverticula were found in the                            left colon. There was no evidence of diverticular                            bleeding.                           Internal hemorrhoids were found during                            retroflexion. The hemorrhoids were moderate and                            Grade I (internal hemorrhoids that do not prolapse).                           The exam was otherwise without abnormality on                            direct and retroflexion views. Complications:            No immediate complications. Estimated blood loss:                             None. Estimated Blood Loss:     Estimated blood loss: none. Impression:               - Two 6 to 9 mm polyps in the sigmoid colon and at                            the ileocecal valve, removed with a cold snare.                            Resected and retrieved.                           - A single non-bleeding colonic angiodysplastic                            lesion.                           -  Mild diverticulosis in the left colon.                           - Internal hemorrhoids.                           - The examination was otherwise normal on direct                            and retroflexion views. Recommendation:           - Repeat colonoscopy, likely 5 years, after studies                            are complete for surveillance based on pathology                            results.                           - Patient has a contact number available for                            emergencies. The signs and symptoms of potential                            delayed complications were discussed with the                            patient. Return to normal activities tomorrow.                            Written discharge instructions were provided to the                            patient.                           - High fiber diet.                           - Continue present medications.                           - Await pathology results.                           - Iron deficiency likely secondary to cecal AVM. Ladene Artist, MD 04/07/2021 2:04:53 PM This report has been signed electronically.

## 2021-04-07 NOTE — Progress Notes (Signed)
VS completed by [place].   Medical history reviewed and updated.

## 2021-04-07 NOTE — Progress Notes (Signed)
See 03/18/2021 H&P, no changes. This patient is appropriate for endoscopic procedures in the ambulatory setting.

## 2021-04-07 NOTE — Patient Instructions (Signed)
Read all of the handouts given to you by your recovery room nurse.  Resume your previous  medications.  YOU HAD AN ENDOSCOPIC PROCEDURE TODAY AT [facility]:   Refer to the procedure report that was given to you for any specific questions about what was found during the examination.  If the procedure report does not answer your questions, please call your gastroenterologist to clarify.  If you requested that your care partner not be given the details of your procedure findings, then the procedure report has been included in a sealed envelope for you to review at your convenience later.  YOU SHOULD EXPECT: Some feelings of bloating in the abdomen. Passage of more gas than usual.  Walking can help get rid of the air that was put into your GI tract during the procedure and reduce the bloating. If you had a lower endoscopy (such as a colonoscopy or flexible sigmoidoscopy) you may notice spotting of blood in your stool or on the toilet paper. If you underwent a bowel prep for your procedure, you may not have a normal bowel movement for a few days.  Please Note:  You might notice some irritation and congestion in your nose or some drainage.  This is from the oxygen used during your procedure.  There is no need for concern and it should clear up in a day or so.  SYMPTOMS TO REPORT IMMEDIATELY:  Following lower endoscopy (colonoscopy or flexible sigmoidoscopy):  Excessive amounts of blood in the stool  Significant tenderness or worsening of abdominal pains  Swelling of the abdomen that is new, acute  Fever of 100F or higher  Following upper endoscopy (EGD)  Vomiting of blood or coffee ground material  New chest pain or pain under the shoulder blades  Painful or persistently difficult swallowing  New shortness of breath  Fever of 100F or higher  Black, tarry-looking stools  For urgent or emergent issues, a gastroenterologist can be reached at any hour by calling [phone]. Do  not use MyChart messaging for urgent concerns.    DIET:  We do recommend a small meal at first, but then you may proceed to your regular diet.  Drink plenty of fluids but you should avoid alcoholic beverages for 24 hours. Try to eat a high fiber diet, and drink plenty of water.  ACTIVITY:  You should plan to take it easy for the rest of today and you should NOT DRIVE or use heavy machinery until tomorrow (because of the sedation medicines used during the test).    FOLLOW UP: Our staff will call the number listed on your records 48-72 hours following your procedure to check on you and address any questions or concerns that you may have regarding the information given to you following your procedure. If we do not reach you, we will leave a message.  We will attempt to reach you two times.  During this call, we will ask if you have developed any symptoms of COVID 19. If you develop any symptoms (ie: fever, flu-like symptoms, shortness of breath, cough etc.) before then, please call [phone].  If you test positive for Covid 19 in the 2 weeks post procedure, please call and report this information to [place].    If any biopsies were taken you will be contacted by phone or by letter within the next 1-3 weeks.  Please call us at [phone] if you have not heard about the biopsies in 3 weeks.    SIGNATURES/CONFIDENTIALITY:  You and/or your care partner have signed paperwork which will be entered into your electronic medical record.  These signatures attest to the fact that that the information above on your After Visit Summary has been reviewed and is understood.  Full responsibility of the confidentiality of this discharge information lies with you and/or your care-partner.

## 2021-04-07 NOTE — Progress Notes (Signed)
Called to room to assist during endoscopic procedure.  Patient ID and intended procedure confirmed with present staff. Received instructions for my participation in the procedure from the performing physician.  

## 2021-04-09 ENCOUNTER — Telehealth: Payer: Self-pay | Admitting: *Deleted

## 2021-04-09 NOTE — Telephone Encounter (Signed)
  Follow up Call-  Call back number 04/07/2021  Post procedure Call Back phone  # [phone]  Permission to leave phone message Yes  Some recent data might be hidden     Patient questions:  Do you have a fever, pain , or abdominal swelling? No. Pain Score  0 *  Have you tolerated food without any problems? Yes.    Have you been able to return to your normal activities? Yes.    Do you have any questions about your discharge instructions: Diet   No. Medications  No. Follow up visit  No.  Do you have questions or concerns about your Care? No.  Actions: * If pain score is 4 or above: No action needed, pain <4.

## 2021-04-15 ENCOUNTER — Ambulatory Visit: Payer: Self-pay | Admitting: Surgery

## 2021-04-15 NOTE — H&P (Signed)
Michelle Huerta [id number]   Referring Provider:  Caren Macadam, MD   Subjective  Chief Complaint: Gallstones   History of Present Illness: 60 year old woman with multiple medical problems including chronic back pain, constipation, depression, grade 1 diastolic dysfunction, hypertension, hypothyroidism, lupus, sleep apnea, morbid obesity, Paget's disease of the bone, osteoporosis, prediabetes uterine fibroids who is referred for evaluation of gallstones. She had an episode of upper abdominal pain in early August, this came on after eating and was sharp, nonradiating and lasted for about 24 hours.  No associated emesis, fever, chills or other associated symptoms.  She has had previous similar symptoms.  Does note intermittent indigestion, queasiness/nausea in her stomach "not feeling right", denies any appetite loss, GERD, change in bowel function.  Lab work was checked at her follow-up visit with her primary care doctor on 8/12 and included a CMP which was within normal limits, and a CBC noting mildly decreased hemoglobin at 11.9 but otherwise no marked abnormalities.  Subsequent iron panel noted iron deficiency and she has subsequently undergone endoscopic evaluation as noted below.  Ultrasound 03/04/2021 demonstrates gallstones without cholecystitis.  Common bile duct 5 mm, increasing heterogenous liver echogenicity with a possible 1 cm nonspecific lesion in the left lobe may be vascular malformation  Endoscopy 04/07/2021 done for unexplained iron deficiency anemia (Dr. Janalee Dane mildly erythematous mucosa without bleeding in the entire stomach, 5 mm mucosal nodule in the duodenal bulb immediately distal to the pylorus, pulsed ulcer deformity in the duodenal bulb.  Pathology demonstrated H. pylori gastritis with intestinal metaplasia and the polyp was hyperplastic gastric type with inflammation Colonoscopy, same day with 2 polyps excised, a single medium sized angiodysplastic lesion without  bleeding in the cecum, and diverticulosis as well as internal hemorrhoids were noted.  Pathology was benign.  Previous abdominal surgery includes tubal ligation  Review of Systems: A complete review of systems was obtained from the patient.  I have reviewed this information and discussed as appropriate with the patient.  See HPI as well for other ROS.   Medical History: Past Medical History: Diagnosis Date  Anemia   Hypertension   Paget's bone disease 2011  Sleep apnea    Patient Active Problem List Diagnosis  H/O Paget's disease of bone  Benign essential HTN  Discoid lupus  Morbid obesity (CMS-HCC)  OSA on CPAP   History reviewed. No pertinent surgical history.   Allergies Allergen Reactions  Penicillins Rash  Zoledronic Acid Nausea And Vomiting   Current Outpatient Medications on File Prior to Visit Medication Sig Dispense Refill  acyclovir (ZOVIRAX) 400 MG tablet acyclovir 400 mg tablet  TAKE 1 TABLET BY MOUTH TWICE DAILY    albuterol 90 mcg/actuation inhaler albuterol sulfate HFA 90 mcg/actuation aerosol inhaler  INHALE 2 PUFFS INTO THE LUNGS EVERY 6 HOURS AS NEEDED FOR WHEEZING/SHORTNESS OF BREATH    cyclobenzaprine (FLEXERIL) 10 MG tablet cyclobenzaprine 10 mg tablet  TAKE 1 TABLET BY MOUTH EVERY DAY AT BEDTIME AS NEEDED    ergocalciferol, vitamin D2, 1,250 mcg (50,000 unit) capsule ergocalciferol (vitamin D2) 1,250 mcg (50,000 unit) capsule  TAKE 1 CAPSULE BY MOUTH 1 TIME A WEEK    ibuprofen (MOTRIN) 800 MG tablet ibuprofen 800 mg tablet  TAKE 1 TABLET BY MOUTH WITH FOOD OR MILK 2 TIMES A DAY AS NEEDED    ketoconazole (NIZORAL) 2 % cream ketoconazole 2 % topical cream  APPLY TOPICALLY TO AFFECTED AREA TWICE DAILY AS NEEDED.    metFORMIN (GLUCOPHAGE) 500 MG tablet metformin 500 mg tablet  TAKE 1 TABLET BY MOUTH EVERY DAY    oxybutynin (DITROPAN-XL) 10 MG XL tablet oxybutynin chloride ER 10 mg tablet,extended release 24 hr  TAKE 1 TABLET BY MOUTH EVERY  DAY    amLODIPine (NORVASC) 5 MG tablet Take 5 mg by mouth once daily  5  cetirizine (ZYRTEC) 10 mg capsule Take 10 mg by mouth once daily    hydroxychloroquine (PLAQUENIL) 200 mg tablet Take 200 mg by mouth once daily  3  omeprazole (PRILOSEC) 40 MG DR capsule omeprazole 40 mg capsule,delayed release  TAKE 1 CAPSULE BY MOUTH EVERY DAY 30 MINUTES BEFORE BREAKFAST    telmisartan (MICARDIS) 80 MG tablet Take 80 mg by mouth once daily  1  temazepam (RESTORIL) 30 mg capsule TAKE 1 CAPSULE BY MOUTH AT BEDTIME AS NEEDED  1  triamcinolone 0.1 % cream triamcinolone acetonide 0.1 % topical cream  APPLY TOPICALLY TO THE AFFECTED AREA TWICE DAILY AS NEEDED    valACYclovir (VALTREX) 500 MG tablet Take 500 mg by mouth once daily  3  zolpidem (AMBIEN) 10 mg tablet zolpidem 10 mg tablet  TAKE 1 TABLET BY MOUTH EVERY DAY AT BEDTIME AS NEEDED    No current facility-administered medications on file prior to visit.   Family History Problem Relation Age of Onset  High blood pressure (Hypertension) Mother   High blood pressure (Hypertension) Father   Paget's disease of bone Sister     Social History  Tobacco Use Smoking Status Former Smoker Smokeless Tobacco Former Systems developer    Social History  Socioeconomic History  Marital status: Single Tobacco Use  Smoking status: Former Smoker  Smokeless tobacco: Former Administrator, [employer] Use: Never used Substance and Sexual Activity  Alcohol use: Yes  Drug use: Defer  Sexual activity: Defer   Objective:   Vitals:  04/15/21 0906 Pulse: 68 Temp: 36.7 C (98 F) SpO2: 99% Weight: (!) 128.4 kg (283 lb) Height: 168.9 cm (5' 6.5")   Body mass index is 44.99 kg/m.  Alert, no distress Unlabored respirations Abdomen is obese, nontender  Assessment and Plan: Diagnoses and all orders for this visit:  Biliary colic -     CCS Case Posting Request; Future    I recommend proceeding with laparoscopic or robotic cholecystectomy with possible  cholangiogram.  Discussed the relevant anatomy using a diagram to demonstrate, and went over surgical technique.  Discussed risks of surgery including bleeding, pain, scarring, intraabdominal injury specifically to the common bile duct and sequelae, bile leak, conversion to open surgery, failure to resolve symptoms, blood clots/ pulmonary embolus, heart attack, pneumonia, stroke, death. Questions welcomed and answered to patient's satisfaction.  She will contact [place] to follow-up on treatment for the H. pylori as she has not started this yet.  Carrick Rijos Raquel James, MD

## 2021-04-21 ENCOUNTER — Encounter: Payer: Medicare Other | Admitting: Gastroenterology

## 2021-04-21 DIAGNOSIS — R531 Weakness: Secondary | ICD-10-CM | POA: Diagnosis not present

## 2021-04-21 DIAGNOSIS — G4489 Other headache syndrome: Secondary | ICD-10-CM | POA: Diagnosis not present

## 2021-04-21 DIAGNOSIS — I10 Essential (primary) hypertension: Secondary | ICD-10-CM | POA: Diagnosis not present

## 2021-04-21 DIAGNOSIS — I499 Cardiac arrhythmia, unspecified: Secondary | ICD-10-CM | POA: Diagnosis not present

## 2021-04-26 ENCOUNTER — Other Ambulatory Visit: Payer: Self-pay

## 2021-04-26 MED ORDER — METRONIDAZOLE 250 MG PO TABS: 250.0000 mg | ORAL_TABLET | Freq: Four times a day (QID) | ORAL | 0 refills | Status: AC

## 2021-04-26 MED ORDER — BISMUTH SUBSALICYLATE 262 MG PO CHEW: 524.0000 mg | CHEWABLE_TABLET | Freq: Four times a day (QID) | ORAL | 0 refills | Status: AC

## 2021-04-26 MED ORDER — OMEPRAZOLE 40 MG PO CPDR: 40.0000 mg | DELAYED_RELEASE_CAPSULE | Freq: Two times a day (BID) | ORAL | 0 refills | Status: DC

## 2021-04-26 MED ORDER — DOXYCYCLINE HYCLATE 100 MG PO CAPS: 100.0000 mg | ORAL_CAPSULE | Freq: Two times a day (BID) | ORAL | 0 refills | Status: AC

## 2021-05-05 ENCOUNTER — Other Ambulatory Visit: Payer: Self-pay

## 2021-05-17 NOTE — Progress Notes (Signed)
Michelle Huerta  05/17/2021   Your procedure is scheduled on:       05/31/21   Report to [facility] Main  Entrance   Report to admitting at    115 pm     Call this number if you have problems the morning of surgery [phone]    Remember: Do not eat food , candy gum or mints :After Midnight. You may have clear liquids from midnight until __   1230pm   CLEAR LIQUID DIET   Foods Allowed                                                                       Coffee and tea, regular and decaf                              Plain Jell-O any favor except red or purple                                            Fruit ices (not with fruit pulp)                                      Iced Popsicles                                     Carbonated beverages, regular and diet                                    Cranberry, grape and apple juices Sports drinks like Gatorade Lightly seasoned clear broth or consume(fat free) Sugar   _____________________________________________________________________    BRUSH YOUR TEETH MORNING OF SURGERY AND RINSE YOUR MOUTH OUT, NO CHEWING GUM CANDY OR MINTS.     Take these medicines the morning of surgery with A SIP OF WATER:   inhalers as usual and bring, amlodipine, flomax, omeprazole if due to take , ditropan   DO NOT TAKE ANY DIABETIC MEDICATIONS DAY OF YOUR SURGERY                               You may not have any metal on your body including hair pins and              piercings  Do not wear jewelry, make-up, lotions, powders or perfumes, deodorant             Do not wear nail polish on your fingernails.  Do not shave  48 hours prior to surgery.              Men may shave face and neck.   Do not bring valuables to the hospital. [facility] IS NOT  RESPONSIBLE   FOR VALUABLES.  Contacts, dentures or bridgework may not be worn into surgery.  Leave suitcase in the car. After surgery it may be brought to your  room.     Patients discharged the day of surgery will not be allowed to drive home. IF YOU ARE HAVING SURGERY AND GOING HOME THE SAME DAY, YOU MUST HAVE AN ADULT TO DRIVE YOU HOME AND BE WITH YOU FOR 24 HOURS. YOU MAY GO HOME BY TAXI OR UBER OR ORTHERWISE, BUT AN ADULT MUST ACCOMPANY YOU HOME AND STAY WITH YOU FOR 24 HOURS.  Name and phone number of your driver:  Special Instructions: N/A              Please read over the following fact sheets you were given: _____________________________________________________________________  [facility] - Preparing for Surgery Before surgery, you can play an important role.  Because skin is not sterile, your skin needs to be as free of germs as possible.  You can reduce the number of germs on your skin by washing with CHG (chlorahexidine gluconate) soap before surgery.  CHG is an antiseptic cleaner which kills germs and bonds with the skin to continue killing germs even after washing. Please DO NOT use if you have an allergy to CHG or antibacterial soaps.  If your skin becomes reddened/irritated stop using the CHG and inform your nurse when you arrive at Short Stay. Do not shave (including legs and underarms) for at least 48 hours prior to the first CHG shower.  You may shave your face/neck. Please follow these instructions carefully:  1.  Shower with CHG Soap the night before surgery and the  morning of Surgery.  2.  If you choose to wash your hair, wash your hair first as usual with your  normal  shampoo.  3.  After you shampoo, rinse your hair and body thoroughly to remove the  shampoo.                           4.  Use CHG as you would any other liquid soap.  You can apply chg directly  to the skin and wash                       Gently with a scrungie or clean washcloth.  5.  Apply the CHG Soap to your body ONLY FROM THE NECK DOWN.   Do not use on face/ open                           Wound or open sores. Avoid contact with eyes, ears mouth and genitals  (private parts).                       Wash face,  Genitals (private parts) with your normal soap.             6.  Wash thoroughly, paying special attention to the area where your surgery  will be performed.  7.  Thoroughly rinse your body with warm water from the neck down.  8.  DO NOT shower/wash with your normal soap after using and rinsing off  the CHG Soap.                9.  Pat yourself dry with a clean towel.            10.  Wear clean pajamas.            11.  Place clean sheets on your bed the night of your first shower and do not  sleep with pets. Day of Surgery : Do not apply any lotions/deodorants the morning of surgery.  Please wear clean clothes to the hospital/surgery center.  FAILURE TO FOLLOW THESE INSTRUCTIONS MAY RESULT IN THE CANCELLATION OF YOUR SURGERY PATIENT SIGNATURE_________________________________  NURSE SIGNATURE__________________________________  ________________________________________________________________________

## 2021-05-17 NOTE — Progress Notes (Addendum)
Anesthesia Review:  PCP: DR Caren Griffins white  Cardiologist : DR Skeet Latch LOV 11/19/20  Chest x-ray : EKG : 11/19/20  Echo : 01/31/20  Stress test:01/06/20  Cardiac Cath :  Activity level: can do a flight of stairs without difficulty  Sleep Study/ CPAP : has cpap  Fasting Blood Sugar :      / Checks Blood Sugar -- times a day:   Blood Thinner/ Instructions /Last Dose: ASA / Instructions/ Last Dose :  Prediabetic- doe snot check glucose at home  HGBA1 C- 05/20/21- 5.9  Glucose 65 at preop appt - PT asymptomatic.  PT reported she ahd [place] sausage and egg this am.  PT given peanut butter and crackers along with bottle water.  Pt completed all of crackers and water.  Shawn Stall made aware.  Recheck of glucose was 74.  PT voices no complaints.  States she feels fine.  [place], [place] aware.  No new orders given.  PT discharged to home and instructed to eat lunch.  PT sent home with aother bottle of water and pack of crackers.

## 2021-05-20 ENCOUNTER — Encounter (HOSPITAL_COMMUNITY)
Admission: RE | Admit: 2021-05-20 | Discharge: 2021-05-20 | Disposition: A | Discharge status: Home / Self Care | Payer: Medicare Other | Source: Ambulatory Visit | Attending: Surgery | Admitting: Surgery

## 2021-05-20 ENCOUNTER — Other Ambulatory Visit: Payer: Self-pay

## 2021-05-20 ENCOUNTER — Encounter (HOSPITAL_COMMUNITY): Payer: Self-pay

## 2021-05-20 VITALS — BP 132/88 | HR 85 | Temp 97.9°F | Resp 16 | Ht 66.5 in | Wt 283.0 lb

## 2021-05-20 DIAGNOSIS — M329 Systemic lupus erythematosus, unspecified: Secondary | ICD-10-CM | POA: Insufficient documentation

## 2021-05-20 DIAGNOSIS — Z87891 Personal history of nicotine dependence: Secondary | ICD-10-CM | POA: Diagnosis not present

## 2021-05-20 DIAGNOSIS — G4730 Sleep apnea, unspecified: Secondary | ICD-10-CM | POA: Diagnosis not present

## 2021-05-20 DIAGNOSIS — K8050 Calculus of bile duct without cholangitis or cholecystitis without obstruction: Secondary | ICD-10-CM | POA: Insufficient documentation

## 2021-05-20 DIAGNOSIS — Z01812 Encounter for preprocedural laboratory examination: Secondary | ICD-10-CM | POA: Diagnosis not present

## 2021-05-20 DIAGNOSIS — K219 Gastro-esophageal reflux disease without esophagitis: Secondary | ICD-10-CM | POA: Insufficient documentation

## 2021-05-20 DIAGNOSIS — I10 Essential (primary) hypertension: Secondary | ICD-10-CM | POA: Insufficient documentation

## 2021-05-20 DIAGNOSIS — R7303 Prediabetes: Secondary | ICD-10-CM | POA: Diagnosis not present

## 2021-05-20 DIAGNOSIS — E139 Other specified diabetes mellitus without complications: Secondary | ICD-10-CM

## 2021-05-20 HISTORY — DX: Gastro-esophageal reflux disease without esophagitis: K21.9

## 2021-05-20 LAB — BASIC METABOLIC PANEL
Anion gap: 10 (ref 5–15)
BUN: 18 mg/dL (ref 6–20)
CO2: 23 mmol/L (ref 22–32)
Calcium: 9.5 mg/dL (ref 8.9–10.3)
Chloride: 103 mmol/L (ref 98–111)
Creatinine, Ser: 0.7 mg/dL (ref 0.44–1.00)
GFR, Estimated: >60 mL/min (ref >60)
Glucose, Bld: 83 mg/dL (ref 70–99)
Potassium: 4.5 mmol/L (ref 3.5–5.1)
Sodium: 136 mmol/L (ref 135–145)

## 2021-05-20 LAB — CBC
HCT: 39.9 % (ref 36.0–46.0)
Hemoglobin: 12.6 g/dL (ref 12.0–15.0)
MCH: 27.3 pg (ref 26.0–34.0)
MCHC: 31.6 g/dL (ref 30.0–36.0)
MCV: 86.6 fL (ref 80.0–100.0)
Platelets: 166 10*3/uL (ref 150–400)
RBC: 4.61 MIL/uL (ref 3.87–5.11)
RDW: 15.6 % — ABNORMAL HIGH (ref 11.5–15.5)
WBC: 6.9 10*3/uL (ref 4.0–10.5)
nRBC: 0 % (ref 0.0–0.2)

## 2021-05-20 LAB — GLUCOSE, CAPILLARY
Glucose-Capillary: 65 mg/dL — ABNORMAL LOW (ref 70–99)
Glucose-Capillary: 74 mg/dL (ref 70–99)

## 2021-05-20 LAB — HEMOGLOBIN A1C
Hgb A1c MFr Bld: 5.9 % — ABNORMAL HIGH (ref 4.8–5.6)
Mean Plasma Glucose: 122.63 mg/dL

## 2021-05-21 ENCOUNTER — Encounter (HOSPITAL_COMMUNITY): Payer: Self-pay | Admitting: Physician Assistant

## 2021-05-21 NOTE — Progress Notes (Signed)
Anesthesia Chart Review   Case: [id number] Date/Time: 05/31/21 1515   Procedure: XI ROBOTIC ASSISTED LAPAROSCOPIC CHOLECYSTECTOMY   Anesthesia type: General   Pre-op diagnosis: BILIARY COLIC   Location: [facility] ROOM 02 / [facility]   Surgeons: Clovis Riley, MD       DISCUSSION:60 y.o. former smoker with h/o GERD, HTN, Lupus, Paget's disease, sleep apnea with CPAP, pre-diabetes, biliary colic scheduled for above procedure 05/31/2021 with Dr. Romana Juniper.    Pt last seen by cardiology 11/19/2020. BP well controlled. 1 year follow up made at this visit.   Anticipate pt can proceed with planned procedure barring acute status change.   VS: BP 132/88   Pulse 85   Temp 36.6 C (Oral)   Resp 16   Ht 5' 6.5" (1.689 m)   Wt 128.4 kg   LMP 08/01/2012   SpO2 100%   BMI 44.99 kg/m   PROVIDERS: Harlan Stains, MD is PCP   Skeet Latch, MD is Cardiologist  LABS: Labs reviewed: Acceptable for surgery. (all labs ordered are listed, but only abnormal results are displayed)  Labs Reviewed  GLUCOSE, CAPILLARY - Abnormal; Notable for the following components:      Result Value   Glucose-Capillary 65 (*)    All other components within normal limits  HEMOGLOBIN A1C - Abnormal; Notable for the following components:   Hgb A1c MFr Bld 5.9 (*)    All other components within normal limits  CBC - Abnormal; Notable for the following components:   RDW 15.6 (*)    All other components within normal limits  BASIC METABOLIC PANEL  GLUCOSE, CAPILLARY     IMAGES:   EKG: 11/19/2020 Rate 88 bpm  NSR  CV: Echo 01/31/2020  1. Left ventricular ejection fraction, by estimation, is 60 to 65%. The  left ventricle has normal function. The left ventricle has no regional  wall motion abnormalities. Left ventricular diastolic parameters are  consistent with Grade I diastolic  dysfunction (impaired relaxation).   2. Right ventricular systolic function is normal. The right ventricular  size is  normal. Tricuspid regurgitation signal is inadequate for assessing  PA pressure.   3. The mitral valve is normal in structure. No evidence of mitral valve  regurgitation. No evidence of mitral stenosis.   4. The aortic valve is normal in structure. Aortic valve regurgitation is  not visualized. No aortic stenosis is present.  Stress Test 01/07/2020 The left ventricular ejection fraction is mildly decreased (45-54%). Nuclear stress EF: 49%. There was no ST segment deviation noted during stress. This is an intermediate risk study. Inferior hypokinesis noted. Consider echo to better assess systolic function. There is no evidence of ischemia. Past Medical History:  Diagnosis Date   Allergic urticaria 07/02/2019   Allergy    Arthritis of both knees    Asthma    Bilateral chronic knee pain    Chronic back pain    Constipation    DDD (degenerative disc disease), cervical    Genital herpes    HSV type 2 at rectum, culture positive 3/15   GERD (gastroesophageal reflux disease)    Glaucoma    Grade I diastolic dysfunction 46/65/9935   noted on ECHO    Heart murmur    History of colon polyps    HLD (hyperlipidemia)    Hypertension    Insomnia    Internal hemorrhoids    Lactose intolerance    Lupus (HCC)    Lupus (HCC)    LVH (left ventricular  hypertrophy) 06/25/2015   Mild, noted on ECHO    Mild sleep apnea    cpap   Morbid obesity ([place]) 06/11/2015   Osteoporosis    Paget's disease    Paget's disease of bone    Dr Trudie Reed, Reclast insufion 02/14/10 - bad reaction to infusion, now tolerates fosamax weekly   Plantar fasciitis, bilateral    PMB (postmenopausal bleeding)    Pre-diabetes    Sleep apnea    Thrombocytopenia (HCC)    TR (tricuspid regurgitation) 06/25/2015   Trace, noted on ECHO    Tubular adenoma polyp of rectum    8/12, Dr Fuller Plan, 5 yr follow up    Uterine fibroid 10/19/2017   noted on CT pelvis   Vitamin D deficiency     Past Surgical History:  Procedure  Laterality Date   AUGMENTATION MAMMAPLASTY Bilateral    BREAST ENHANCEMENT SURGERY     BREAST REDUCTION SURGERY     COLONOSCOPY  01/19/2016   DILATION AND CURETTAGE OF UTERUS N/A 06/24/2015   Procedure: DILATATION AND CURETTAGE;  Surgeon: Emily Filbert, MD;  Location: [facility];  Service: Gynecology;  Laterality: N/A;   KNEE ARTHROSCOPY WITH SUBCHONDROPLASTY Right 05/03/2018   Procedure: RIGHT KNEE ARTHROSCOPY WITH DEBRIDEMENT, PARTIAL MEDIAL MENISCECTOMY SUBCHONDROPLASTY MEDIAL TIBIAL PLATEAU AND MEDIAL FEMORAL CONDYLE;  Surgeon: Mcarthur Rossetti, MD;  Location: [facility];  Service: Orthopedics;  Laterality: Right;   POLYPECTOMY     REDUCTION MAMMAPLASTY Bilateral    TOE SURGERY     removal of bone in each foot   TUBAL LIGATION      MEDICATIONS:  acetaminophen (TYLENOL) 500 MG tablet   acyclovir (ZOVIRAX) 400 MG tablet   albuterol (VENTOLIN HFA) 108 (90 Base) MCG/ACT inhaler   amLODipine (NORVASC) 10 MG tablet   ARTIFICIAL TEAR SOLUTION OP   cetirizine (ZYRTEC) 10 MG tablet   ciclopirox (LOPROX) 0.77 % cream   cyclobenzaprine (FLEXERIL) 10 MG tablet   docusate sodium (COLACE) 100 MG capsule   fluticasone (FLONASE) 50 MCG/ACT nasal spray   ketoconazole (NIZORAL) 2 % cream   Melatonin 10 MG CAPS   metFORMIN (GLUCOPHAGE) 500 MG tablet   Multiple Vitamins-Minerals (ADULT GUMMY PO)   omeprazole (PRILOSEC) 40 MG capsule   oxybutynin (DITROPAN-XL) 10 MG 24 hr tablet   Probiotic Product (PROBIOTIC PO)   telmisartan (MICARDIS) 80 MG tablet   telmisartan-hydrochlorothiazide (MICARDIS HCT) 80-12.5 MG tablet   Vitamin D, Ergocalciferol, (DRISDOL) 1.25 MG (50000 UNIT) CAPS capsule   zolpidem (AMBIEN) 10 MG tablet   No current facility-administered medications for this encounter.    Konrad Felix Ward, PA-C [facility] [phone]

## 2021-05-31 ENCOUNTER — Ambulatory Visit (HOSPITAL_COMMUNITY): Admission: RE | Admit: 2021-05-31 | Payer: Medicare Other | Source: Ambulatory Visit | Admitting: Surgery

## 2021-05-31 ENCOUNTER — Encounter (HOSPITAL_COMMUNITY): Admission: RE | Payer: Self-pay | Source: Ambulatory Visit

## 2021-05-31 SURGERY — CHOLECYSTECTOMY, ROBOT-ASSISTED, LAPAROSCOPIC
Anesthesia: General

## 2021-06-24 ENCOUNTER — Encounter (HOSPITAL_COMMUNITY): Payer: Self-pay

## 2021-06-24 NOTE — Patient Instructions (Addendum)
DUE TO COVID-19 ONLY ONE VISITOR IS ALLOWED TO COME WITH YOU AND STAY IN THE WAITING ROOM ONLY DURING PRE OP AND PROCEDURE DAY OF SURGERY IF YOU ARE GOING HOME AFTER SURGERY. IF YOU ARE SPENDING THE NIGHT 2 PEOPLE MAY VISIT WITH YOU IN YOUR PRIVATE ROOM AFTER SURGERY UNTIL VISITING  HOURS ARE OVER AT 800 PM AND 1  VISITOR  MAY  SPEND THE NIGHT.              Michelle Huerta     Your procedure is scheduled on: 07/01/21   Report to [facility] Main  Entrance   Report to admitting at  12:45 PM     Call this number if you have problems the morning of surgery [phone]    Remember: Do not eat food  :After Midnight the night before your surgery,   You may have clear liquids from midnight until  9:00 am    CLEAR LIQUID DIET   Foods Allowed                                                                     Foods Excluded  Coffee and tea, regular and decaf                             liquids that you cannot  Plain Jell-O any favor except red or purple                                           see through such as: Fruit ices (not with fruit pulp)                                     milk, soups, orange juice  Iced Popsicles                                    All solid food Carbonated beverages, regular and diet                                    Cranberry, grape and apple juices Sports drinks like Gatorade Lightly seasoned clear broth or consume(fat free) Sugar     BRUSH YOUR TEETH MORNING OF SURGERY AND RINSE YOUR MOUTH OUT, NO CHEWING GUM CANDY OR MINTS.     Take these medicines the morning of surgery with A SIP OF WATER: Amlodipine, Omeprazole, Oxybutynin.  Use your inhaler and bring it with you  How to Manage Your Diabetes Before and After Surgery  Why is it important to control my blood sugar before and after surgery? Improving blood sugar levels before and after surgery helps healing and can limit problems. A way of improving blood sugar control is eating a healthy  diet by:  Eating less sugar and carbohydrates  Increasing activity/exercise  Talking with your doctor about reaching your blood sugar goals  High blood sugars (greater than 180 mg/dL) can raise your risk of infections and slow your recovery, so you will need to focus on controlling your diabetes during the weeks before surgery. Make sure that the doctor who takes care of your diabetes knows about your planned surgery including the date and location.  How do I manage my blood sugar before surgery? Check your blood sugar at least 4 times a day, starting 2 days before surgery, to make sure that the level is not too high or low. Check your blood sugar the morning of your surgery when you wake up and every 2 hours until you get to the Short Stay unit. If your blood sugar is less than 70 mg/dL, you will need to treat for low blood sugar: Do not take insulin. Treat a low blood sugar (less than 70 mg/dL) with  cup of clear juice (cranberry or apple), 4 glucose tablets, OR glucose gel. Recheck blood sugar in 15 minutes after treatment (to make sure it is greater than 70 mg/dL). If your blood sugar is not greater than 70 mg/dL on recheck, call [phone] for further instructions. Report your blood sugar to the short stay nurse when you get to Short Stay.  If you are admitted to the hospital after surgery: Your blood sugar will be checked by the staff and you will probably be given insulin after surgery (instead of oral diabetes medicines) to make sure you have good blood sugar levels. The goal for blood sugar control after surgery is 80-180 mg/dL.   WHAT DO I DO ABOUT MY DIABETES MEDICATION?  Do not take oral diabetes medicines (pills) the morning of surgery.                                 You may not have any metal on your body including hair pins and              piercings  Do not wear jewelry, make-up, lotions, powders or perfumes, deodorant             Do not wear nail polish on your  fingernails.  Do not shave  48 hours prior to surgery.                 Do not bring valuables to the hospital. [place].  Contacts, dentures or bridgework may not be worn into surgery.     Patients discharged the day of surgery will not be allowed to drive home.   IF YOU ARE HAVING SURGERY AND GOING HOME THE SAME DAY, YOU MUST HAVE AN ADULT TO DRIVE YOU HOME AND BE WITH YOU FOR 24 HOURS.  YOU MAY GO HOME BY [employer] OR UBER OR ORTHERWISE, BUT AN ADULT MUST ACCOMPANY YOU HOME AND STAY WITH YOU FOR 24 HOURS.  Name and phone number of your driver:  Special Instructions: N/A              Please read over the following fact sheets you were given: _____________________________________________________________________             [facility] - Preparing for Surgery Before surgery, you can play an important role.  Because skin is not sterile, your skin needs to be as free of germs as possible.  You can reduce the number of germs on  your skin by washing with CHG (chlorahexidine gluconate) soap before surgery.  CHG is an antiseptic cleaner which kills germs and bonds with the skin to continue killing germs even after washing. Please DO NOT use if you have an allergy to CHG or antibacterial soaps.  If your skin becomes reddened/irritated stop using the CHG and inform your nurse when you arrive at Short Stay. Do not shave (including legs and underarms) for at least 48 hours prior to the first CHG shower Please follow these instructions carefully:  1.  Shower with CHG Soap the night before surgery and the  morning of Surgery.  2.  If you choose to wash your hair, wash your hair first as usual with your  normal  shampoo.  3.  After you shampoo, rinse your hair and body thoroughly to remove the  shampoo.                            4.  Use CHG as you would any other liquid soap.  You can apply chg directly  to the skin and wash                       Gently  with a scrungie or clean washcloth.  5.  Apply the CHG Soap to your body ONLY FROM THE NECK DOWN.   Do not use on face/ open                           Wound or open sores. Avoid contact with eyes, ears mouth and genitals (private parts).                       Wash face,  Genitals (private parts) with your normal soap.             6.  Wash thoroughly, paying special attention to the area where your surgery  will be performed.  7.  Thoroughly rinse your body with warm water from the neck down.  8.  DO NOT shower/wash with your normal soap after using and rinsing off  the CHG Soap.             9.  Pat yourself dry with a clean towel.            10.  Wear clean pajamas.            11.  Place clean sheets on your bed the night of your first shower and do not  sleep with pets. Day of Surgery : Do not apply any lotions/deodorants the morning of surgery.  Please wear clean clothes to the hospital/surgery center.  FAILURE TO FOLLOW THESE INSTRUCTIONS MAY RESULT IN THE CANCELLATION OF YOUR SURGERY PATIENT SIGNATURE_________________________________  NURSE SIGNATURE__________________________________  ________________________________________________________________________

## 2021-06-25 ENCOUNTER — Other Ambulatory Visit: Payer: Self-pay

## 2021-06-25 ENCOUNTER — Encounter (HOSPITAL_COMMUNITY)
Admission: RE | Admit: 2021-06-25 | Discharge: 2021-06-25 | Disposition: A | Discharge status: Home / Self Care | Payer: Medicare Other | Source: Ambulatory Visit | Attending: Surgery | Admitting: Surgery

## 2021-06-25 ENCOUNTER — Encounter (HOSPITAL_COMMUNITY): Payer: Self-pay

## 2021-06-25 VITALS — BP 118/69 | HR 80 | Temp 98.1°F | Resp 18 | Ht 67.0 in | Wt 280.0 lb

## 2021-06-25 DIAGNOSIS — R7303 Prediabetes: Secondary | ICD-10-CM | POA: Diagnosis not present

## 2021-06-25 DIAGNOSIS — Z01812 Encounter for preprocedural laboratory examination: Secondary | ICD-10-CM | POA: Insufficient documentation

## 2021-06-25 LAB — BASIC METABOLIC PANEL
Anion gap: 8 (ref 5–15)
BUN: 18 mg/dL (ref 6–20)
CO2: 27 mmol/L (ref 22–32)
Calcium: 9 mg/dL (ref 8.9–10.3)
Chloride: 106 mmol/L (ref 98–111)
Creatinine, Ser: 0.92 mg/dL (ref 0.44–1.00)
GFR, Estimated: >60 mL/min (ref >60)
Glucose, Bld: 109 mg/dL — ABNORMAL HIGH (ref 70–99)
Potassium: 3.9 mmol/L (ref 3.5–5.1)
Sodium: 141 mmol/L (ref 135–145)

## 2021-06-25 LAB — CBC
HCT: 38.1 % (ref 36.0–46.0)
Hemoglobin: 11.8 g/dL — ABNORMAL LOW (ref 12.0–15.0)
MCH: 27.3 pg (ref 26.0–34.0)
MCHC: 31 g/dL (ref 30.0–36.0)
MCV: 88 fL (ref 80.0–100.0)
Platelets: 199 10*3/uL (ref 150–400)
RBC: 4.33 MIL/uL (ref 3.87–5.11)
RDW: 16.4 % — ABNORMAL HIGH (ref 11.5–15.5)
WBC: 8.9 10*3/uL (ref 4.0–10.5)
nRBC: 0 % (ref 0.0–0.2)

## 2021-06-25 LAB — GLUCOSE, CAPILLARY: Glucose-Capillary: 96 mg/dL (ref 70–99)

## 2021-06-25 NOTE — Progress Notes (Signed)
COVID test- NA   PCP - Dr. Deland Pretty Cardiologist - Dr. Berneice Gandy  Chest x-ray - no EKG - 11/19/20-epic Stress Test - 01/07/20-epic ECHO - 01/31/20-epic Cardiac Cath - no Pacemaker/ICD device last checked:NA  Sleep Study - yes CPAP - yes  Fasting Blood Sugar - Pt doesn't test. She says that she is pre-diabetic but takes Metformin every day. Checks Blood Sugar _____ times a day  Blood Thinner Instructions:NA Aspirin Instructions: Last Dose:  Anesthesia review: yes  Patient denies shortness of breath, fever, cough and chest pain at PAT appointment Pt has a BMI of 43.8. She gets SOB on one flight of stairs, sometimes while doing housework but not while getting dressed in the am.  Patient verbalized understanding of instructions that were given to them at the PAT appointment. Patient was also instructed that they will need to review over the PAT instructions again at home before surgery. yes

## 2021-07-01 ENCOUNTER — Other Ambulatory Visit: Payer: Self-pay

## 2021-07-01 ENCOUNTER — Encounter (HOSPITAL_COMMUNITY)
Admission: RE | Disposition: A | Discharge status: Home / Self Care | Payer: Self-pay | Source: Ambulatory Visit | Attending: Surgery

## 2021-07-01 ENCOUNTER — Ambulatory Visit (HOSPITAL_COMMUNITY): Payer: Medicare Other | Admitting: Physician Assistant

## 2021-07-01 ENCOUNTER — Ambulatory Visit (HOSPITAL_COMMUNITY)
Admission: RE | Admit: 2021-07-01 | Discharge: 2021-07-01 | Disposition: A | Discharge status: Home / Self Care | Payer: Medicare Other | Source: Ambulatory Visit | Attending: Surgery | Admitting: Surgery

## 2021-07-01 ENCOUNTER — Encounter (HOSPITAL_COMMUNITY): Payer: Self-pay | Admitting: Surgery

## 2021-07-01 ENCOUNTER — Ambulatory Visit (HOSPITAL_COMMUNITY): Payer: Medicare Other | Admitting: Certified Registered"

## 2021-07-01 DIAGNOSIS — M889 Osteitis deformans of unspecified bone: Secondary | ICD-10-CM | POA: Insufficient documentation

## 2021-07-01 DIAGNOSIS — Z6841 Body Mass Index (BMI) 40.0 and over, adult: Secondary | ICD-10-CM | POA: Insufficient documentation

## 2021-07-01 DIAGNOSIS — E039 Hypothyroidism, unspecified: Secondary | ICD-10-CM | POA: Insufficient documentation

## 2021-07-01 DIAGNOSIS — Z7984 Long term (current) use of oral hypoglycemic drugs: Secondary | ICD-10-CM | POA: Insufficient documentation

## 2021-07-01 DIAGNOSIS — Z87891 Personal history of nicotine dependence: Secondary | ICD-10-CM | POA: Insufficient documentation

## 2021-07-01 DIAGNOSIS — G8929 Other chronic pain: Secondary | ICD-10-CM | POA: Diagnosis not present

## 2021-07-01 DIAGNOSIS — R7303 Prediabetes: Secondary | ICD-10-CM | POA: Insufficient documentation

## 2021-07-01 DIAGNOSIS — G4733 Obstructive sleep apnea (adult) (pediatric): Secondary | ICD-10-CM | POA: Insufficient documentation

## 2021-07-01 DIAGNOSIS — I10 Essential (primary) hypertension: Secondary | ICD-10-CM | POA: Insufficient documentation

## 2021-07-01 DIAGNOSIS — M810 Age-related osteoporosis without current pathological fracture: Secondary | ICD-10-CM | POA: Insufficient documentation

## 2021-07-01 DIAGNOSIS — Z79899 Other long term (current) drug therapy: Secondary | ICD-10-CM | POA: Insufficient documentation

## 2021-07-01 DIAGNOSIS — M329 Systemic lupus erythematosus, unspecified: Secondary | ICD-10-CM | POA: Diagnosis not present

## 2021-07-01 DIAGNOSIS — K8044 Calculus of bile duct with chronic cholecystitis without obstruction: Secondary | ICD-10-CM | POA: Insufficient documentation

## 2021-07-01 LAB — GLUCOSE, CAPILLARY
Glucose-Capillary: 91 mg/dL (ref 70–99)
Glucose-Capillary: 97 mg/dL (ref 70–99)

## 2021-07-01 SURGERY — CHOLECYSTECTOMY, ROBOT-ASSISTED, LAPAROSCOPIC
Anesthesia: General | Site: Abdomen

## 2021-07-01 MED ORDER — FENTANYL CITRATE (PF) 250 MCG/5ML IJ SOLN
INTRAMUSCULAR | Status: AC
  Filled 2021-07-01: qty 5

## 2021-07-01 MED ORDER — CHLORHEXIDINE GLUCONATE 4 % EX LIQD: 60.0000 mL | Freq: Once | CUTANEOUS | Status: DC

## 2021-07-01 MED ORDER — BUPIVACAINE-EPINEPHRINE 0.25% -1:200000 IJ SOLN
INTRAMUSCULAR | Status: DC | PRN
  Administered 2021-07-01: 30 mL

## 2021-07-01 MED ORDER — PHENYLEPHRINE 40 MCG/ML (10ML) SYRINGE FOR IV PUSH (FOR BLOOD PRESSURE SUPPORT)
PREFILLED_SYRINGE | INTRAVENOUS | Status: AC
  Filled 2021-07-01: qty 10

## 2021-07-01 MED ORDER — MIDAZOLAM HCL 2 MG/2ML IJ SOLN
INTRAMUSCULAR | Status: AC
  Filled 2021-07-01: qty 2

## 2021-07-01 MED ORDER — BUPIVACAINE LIPOSOME 1.3 % IJ SUSP
INTRAMUSCULAR | Status: DC | PRN
  Administered 2021-07-01: 20 mL

## 2021-07-01 MED ORDER — CHLORHEXIDINE GLUCONATE 0.12 % MT SOLN
15.0000 mL | Freq: Once | OROMUCOSAL | Status: AC
  Administered 2021-07-01: 13:00:00 15 mL via OROMUCOSAL

## 2021-07-01 MED ORDER — LIDOCAINE 2% (20 MG/ML) 5 ML SYRINGE
INTRAMUSCULAR | Status: DC | PRN
  Administered 2021-07-01: 20 mg via INTRAVENOUS

## 2021-07-01 MED ORDER — ACETAMINOPHEN 500 MG PO TABS
1000.0000 mg | ORAL_TABLET | Freq: Once | ORAL | Status: AC
  Administered 2021-07-01: 13:00:00 1000 mg via ORAL
  Filled 2021-07-01: qty 2

## 2021-07-01 MED ORDER — SODIUM CHLORIDE 0.9% FLUSH: 3.0000 mL | INTRAVENOUS | Status: DC | PRN

## 2021-07-01 MED ORDER — ROCURONIUM BROMIDE 10 MG/ML (PF) SYRINGE
PREFILLED_SYRINGE | INTRAVENOUS | Status: DC | PRN
  Administered 2021-07-01: 10 mg via INTRAVENOUS
  Administered 2021-07-01: 50 mg via INTRAVENOUS

## 2021-07-01 MED ORDER — EPHEDRINE 5 MG/ML INJ
INTRAVENOUS | Status: AC
  Filled 2021-07-01: qty 5

## 2021-07-01 MED ORDER — MIDAZOLAM HCL 2 MG/2ML IJ SOLN
INTRAMUSCULAR | Status: DC | PRN
  Administered 2021-07-01 (×2): 1 mg via INTRAVENOUS

## 2021-07-01 MED ORDER — ORAL CARE MOUTH RINSE: 15.0000 mL | Freq: Once | OROMUCOSAL | Status: AC

## 2021-07-01 MED ORDER — TRAMADOL HCL 50 MG PO TABS: 50.0000 mg | ORAL_TABLET | Freq: Four times a day (QID) | ORAL | 0 refills | Status: DC | PRN

## 2021-07-01 MED ORDER — LACTATED RINGERS IV SOLN: INTRAVENOUS | Status: DC

## 2021-07-01 MED ORDER — BUPIVACAINE LIPOSOME 1.3 % IJ SUSP
INTRAMUSCULAR | Status: AC
  Filled 2021-07-01: qty 20

## 2021-07-01 MED ORDER — BUPIVACAINE-EPINEPHRINE (PF) 0.25% -1:200000 IJ SOLN
INTRAMUSCULAR | Status: AC
  Filled 2021-07-01: qty 30

## 2021-07-01 MED ORDER — SUCCINYLCHOLINE CHLORIDE 200 MG/10ML IV SOSY
PREFILLED_SYRINGE | INTRAVENOUS | Status: DC | PRN
Start: 2021-07-01 — End: 2021-07-01
  Administered 2021-07-01: 200 mg via INTRAVENOUS

## 2021-07-01 MED ORDER — PROPOFOL 10 MG/ML IV BOLUS
INTRAVENOUS | Status: AC
  Filled 2021-07-01: qty 20

## 2021-07-01 MED ORDER — INDOCYANINE GREEN 25 MG IV SOLR
2.5000 mg | Freq: Once | INTRAVENOUS | Status: AC
  Administered 2021-07-01: 13:00:00 2.5 mg via INTRAVENOUS
  Filled 2021-07-01: qty 1

## 2021-07-01 MED ORDER — FENTANYL CITRATE PF 50 MCG/ML IJ SOSY: 25.0000 ug | PREFILLED_SYRINGE | INTRAMUSCULAR | Status: DC | PRN

## 2021-07-01 MED ORDER — DEXAMETHASONE SODIUM PHOSPHATE 10 MG/ML IJ SOLN
INTRAMUSCULAR | Status: DC | PRN
  Administered 2021-07-01: 10 mg via INTRAVENOUS

## 2021-07-01 MED ORDER — ROCURONIUM BROMIDE 10 MG/ML (PF) SYRINGE
PREFILLED_SYRINGE | INTRAVENOUS | Status: AC
  Filled 2021-07-01: qty 20

## 2021-07-01 MED ORDER — SODIUM CHLORIDE 0.9 % IV SOLN: 250.0000 mL | INTRAVENOUS | Status: DC | PRN

## 2021-07-01 MED ORDER — ONDANSETRON HCL 4 MG/2ML IJ SOLN
INTRAMUSCULAR | Status: DC | PRN
  Administered 2021-07-01: 4 mg via INTRAVENOUS

## 2021-07-01 MED ORDER — ONDANSETRON HCL 4 MG/2ML IJ SOLN
INTRAMUSCULAR | Status: AC
  Filled 2021-07-01: qty 2

## 2021-07-01 MED ORDER — DEXAMETHASONE SODIUM PHOSPHATE 10 MG/ML IJ SOLN
INTRAMUSCULAR | Status: AC
  Filled 2021-07-01: qty 1

## 2021-07-01 MED ORDER — LIDOCAINE 2% (20 MG/ML) 5 ML SYRINGE
INTRAMUSCULAR | Status: AC
  Filled 2021-07-01: qty 5

## 2021-07-01 MED ORDER — FENTANYL CITRATE (PF) 250 MCG/5ML IJ SOLN
INTRAMUSCULAR | Status: DC | PRN
  Administered 2021-07-01 (×2): 50 ug via INTRAVENOUS
  Administered 2021-07-01: 25 ug via INTRAVENOUS
  Administered 2021-07-01 (×5): 50 ug via INTRAVENOUS
  Administered 2021-07-01: 25 ug via INTRAVENOUS

## 2021-07-01 MED ORDER — ACETAMINOPHEN 650 MG RE SUPP
650.0000 mg | RECTAL | Status: DC | PRN
  Filled 2021-07-01: qty 1

## 2021-07-01 MED ORDER — OXYCODONE HCL 5 MG PO TABS: 5.0000 mg | ORAL_TABLET | Freq: Once | ORAL | Status: DC | PRN

## 2021-07-01 MED ORDER — 0.9 % SODIUM CHLORIDE (POUR BTL) OPTIME
TOPICAL | Status: DC | PRN
  Administered 2021-07-01: 14:00:00 1000 mL

## 2021-07-01 MED ORDER — PROPOFOL 10 MG/ML IV BOLUS
INTRAVENOUS | Status: DC | PRN
  Administered 2021-07-01: 10 mg via INTRAVENOUS
  Administered 2021-07-01: 170 mg via INTRAVENOUS
  Administered 2021-07-01 (×2): 10 mg via INTRAVENOUS

## 2021-07-01 MED ORDER — ACETAMINOPHEN 325 MG PO TABS: 650.0000 mg | ORAL_TABLET | ORAL | Status: DC | PRN

## 2021-07-01 MED ORDER — SODIUM CHLORIDE 0.9% FLUSH: 3.0000 mL | Freq: Two times a day (BID) | INTRAVENOUS | Status: DC

## 2021-07-01 MED ORDER — OXYCODONE HCL 5 MG PO TABS: 5.0000 mg | ORAL_TABLET | ORAL | Status: DC | PRN

## 2021-07-01 MED ORDER — SUCCINYLCHOLINE CHLORIDE 200 MG/10ML IV SOSY
PREFILLED_SYRINGE | INTRAVENOUS | Status: AC
  Filled 2021-07-01: qty 10

## 2021-07-01 MED ORDER — OXYCODONE HCL 5 MG/5ML PO SOLN: 5.0000 mg | Freq: Once | ORAL | Status: DC | PRN

## 2021-07-01 MED ORDER — LABETALOL HCL 5 MG/ML IV SOLN
INTRAVENOUS | Status: AC
  Filled 2021-07-01: qty 4

## 2021-07-01 MED ORDER — ONDANSETRON HCL 4 MG/2ML IJ SOLN: 4.0000 mg | Freq: Once | INTRAMUSCULAR | Status: DC | PRN

## 2021-07-01 MED ORDER — CEFAZOLIN IN SODIUM CHLORIDE 3-0.9 GM/100ML-% IV SOLN
3.0000 g | INTRAVENOUS | Status: AC
  Administered 2021-07-01: 14:00:00 3 g via INTRAVENOUS
  Filled 2021-07-01: qty 100

## 2021-07-01 MED ORDER — AMISULPRIDE (ANTIEMETIC) 5 MG/2ML IV SOLN: 10.0000 mg | Freq: Once | INTRAVENOUS | Status: DC | PRN

## 2021-07-01 MED ORDER — SUGAMMADEX SODIUM 500 MG/5ML IV SOLN
INTRAVENOUS | Status: DC | PRN
  Administered 2021-07-01: 400 mg via INTRAVENOUS

## 2021-07-01 SURGICAL SUPPLY — 50 items
APPLIER CLIP 5 13 M/L LIGAMAX5 (MISCELLANEOUS)
BAG COUNTER SPONGE SURGICOUNT (BAG) ×2 IMPLANT
BAG SURGICOUNT SPONGE COUNTING (BAG) ×1
BLADE SURG SZ11 CARB STEEL (BLADE) ×3 IMPLANT
CHLORAPREP W/TINT 26 (MISCELLANEOUS) ×3 IMPLANT
CLIP APPLIE 5 13 M/L LIGAMAX5 (MISCELLANEOUS) IMPLANT
CLIP LIGATING HEM O LOK PURPLE (MISCELLANEOUS) ×3 IMPLANT
COVER TIP SHEARS 8 DVNC (MISCELLANEOUS) ×1 IMPLANT
COVER TIP SHEARS 8MM DA VINCI (MISCELLANEOUS) ×3
DECANTER SPIKE VIAL GLASS SM (MISCELLANEOUS) ×3 IMPLANT
DERMABOND ADVANCED (GAUZE/BANDAGES/DRESSINGS)
DERMABOND ADVANCED .7 DNX12 (GAUZE/BANDAGES/DRESSINGS) IMPLANT
DRAPE ARM DVNC X/XI (DISPOSABLE) ×4 IMPLANT
DRAPE COLUMN DVNC XI (DISPOSABLE) ×1 IMPLANT
DRAPE DA VINCI XI ARM (DISPOSABLE) ×12
DRAPE DA VINCI XI COLUMN (DISPOSABLE) ×3
ELECT REM PT RETURN 15FT ADLT (MISCELLANEOUS) ×3 IMPLANT
GLOVE SURG ENC MOIS LTX SZ6 (GLOVE) ×6 IMPLANT
GLOVE SURG MICRO LTX SZ6 (GLOVE) ×3 IMPLANT
GLOVE SURG UNDER LTX SZ6.5 (GLOVE) ×6 IMPLANT
GOWN STRL REUS W/TWL LRG LVL3 (GOWN DISPOSABLE) ×6 IMPLANT
GOWN STRL REUS W/TWL XL LVL3 (GOWN DISPOSABLE) IMPLANT
GRASPER SUT TROCAR 14GX15 (MISCELLANEOUS) ×3 IMPLANT
IRRIG SUCT STRYKERFLOW 2 WTIP (MISCELLANEOUS)
IRRIGATION SUCT STRKRFLW 2 WTP (MISCELLANEOUS) IMPLANT
KIT BASIN OR (CUSTOM PROCEDURE TRAY) ×3 IMPLANT
KIT PROCEDURE DA VINCI SI (MISCELLANEOUS)
KIT PROCEDURE DVNC SI (MISCELLANEOUS) IMPLANT
KIT TURNOVER KIT A (KITS) IMPLANT
MANIFOLD NEPTUNE II (INSTRUMENTS) ×3 IMPLANT
NDL INSUFFLATION 14GA 120MM (NEEDLE) ×1 IMPLANT
NEEDLE HYPO 22GX1.5 SAFETY (NEEDLE) ×3 IMPLANT
NEEDLE INSUFFLATION 14GA 120MM (NEEDLE) ×3 IMPLANT
PACK CARDIOVASCULAR III (CUSTOM PROCEDURE TRAY) ×3 IMPLANT
SEAL CANN UNIV 5-8 DVNC XI (MISCELLANEOUS) ×4 IMPLANT
SEAL XI 5MM-8MM UNIVERSAL (MISCELLANEOUS) ×12
SEALER VESSEL DA VINCI XI (MISCELLANEOUS)
SEALER VESSEL EXT DVNC XI (MISCELLANEOUS) IMPLANT
SOL ANTI FOG 6CC (MISCELLANEOUS) ×1 IMPLANT
SOLUTION ANTI FOG 6CC (MISCELLANEOUS) ×2
SOLUTION ELECTROLUBE (MISCELLANEOUS) ×3 IMPLANT
SPONGE T-LAP 18X18 ~~LOC~~+RFID (SPONGE) ×3 IMPLANT
SUT MNCRL AB 4-0 PS2 18 (SUTURE) ×3 IMPLANT
SYR 20ML LL LF (SYRINGE) ×3 IMPLANT
SYS RETRIEVAL 5MM INZII UNIV (BASKET) ×3
SYSTEM RETRIEVL 5MM INZII UNIV (BASKET) ×1 IMPLANT
TOWEL OR 17X26 10 PK STRL BLUE (TOWEL DISPOSABLE) ×3 IMPLANT
TOWEL OR NON WOVEN STRL DISP B (DISPOSABLE) ×3 IMPLANT
TRAY FOLEY MTR SLVR 16FR STAT (SET/KITS/TRAYS/PACK) IMPLANT
TUBING INSUFFLATION 10FT LAP (TUBING) ×3 IMPLANT

## 2021-07-01 NOTE — Transfer of Care (Signed)
Immediate Anesthesia Transfer of Care Note  Patient: Michelle Huerta  Procedure(s) Performed: XI ROBOTIC ASSISTED LAPAROSCOPIC CHOLECYSTECTOMY (Abdomen)  Patient Location: PACU  Anesthesia Type:General  Level of Consciousness: awake and alert   Airway & Oxygen Therapy: Patient Spontanous Breathing and Patient connected to face mask oxygen  Post-op Assessment: Report given to RN and Post -op Vital signs reviewed and stable  Post vital signs: Reviewed and stable  Last Vitals:  Vitals Value Taken Time  BP    Temp    Pulse    Resp    SpO2      Last Pain:  Vitals:   07/01/21 1221  TempSrc: Oral  PainSc: 0-No pain         Complications: No notable events documented.

## 2021-07-01 NOTE — Anesthesia Preprocedure Evaluation (Addendum)
Anesthesia Evaluation  Patient identified by MRN, date of birth, ID band Patient awake    Reviewed: Allergy & Precautions, NPO status , Patient's Chart, lab work & pertinent test results  History of Anesthesia Complications Negative for: history of anesthetic complications  Airway Mallampati: III  TM Distance: >3 FB Neck ROM: Full    Dental  (+) Dental Advisory Given   Pulmonary asthma , sleep apnea and Continuous Positive Airway Pressure Ventilation , former smoker,    Pulmonary exam normal        Cardiovascular hypertension, Pt. on medications Normal cardiovascular exam     Neuro/Psych negative neurological ROS     GI/Hepatic Neg liver ROS, GERD  ,  Endo/Other  diabetes, Oral Hypoglycemic AgentsMorbid obesity  Renal/GU negative Renal ROS  negative genitourinary   Musculoskeletal  (+) Arthritis ,   Abdominal   Peds  Hematology  (+) anemia ,   Anesthesia Other Findings   Reproductive/Obstetrics                           Anesthesia Physical Anesthesia Plan  ASA: 3  Anesthesia Plan: General   Post-op Pain Management: Tylenol PO (pre-op) and Toradol IV (intra-op)   Induction: Intravenous  PONV Risk Score and Plan: 4 or greater and Ondansetron, Dexamethasone, Treatment may vary due to age or medical condition and Midazolam  Airway Management Planned: Oral ETT  Additional Equipment: None  Intra-op Plan:   Post-operative Plan: Extubation in OR  Informed Consent: I have reviewed the patients History and Physical, chart, labs and discussed the procedure including the risks, benefits and alternatives for the proposed anesthesia with the patient or authorized representative who has indicated his/her understanding and acceptance.     Dental advisory given  Plan Discussed with:   Anesthesia Plan Comments:        Anesthesia Quick Evaluation

## 2021-07-01 NOTE — Op Note (Signed)
Operative Note  ITALY WARRINER  [id number]  [id number]  07/01/2021   Surgeon: Clovis Riley MD   Procedure performed: Robotic cholecystectomy    Preop diagnosis: biliary colic Post-op diagnosis/intraop findings: same, chronic cholecystitis   Specimens: gallbladder Retained items: no  EBL: minimal cc Complications: none   Description of procedure:  After obtaining informed consent the patient was brought to the operating room. Antibiotics were administered. SCD's were applied. General endotracheal anesthesia was initiated and a formal time-out was performed. The abdomen was prepped and draped in the usual sterile fashion and peritoneal access gained using a left subcostal veress needle after instilling the site with local. Insufflation to 24mmHg was obtained, periumbilical 65mm trocar and camera inserted, and gross inspection revealed no evidence of injury from our entry or other intraabdominal abnormalities.  Bilateral laparoscopic assisted taps blocks were performed with Exparel mixed with quarter percent Marcaine.  Three additional 8 mm robotictrocars were introduced under direct visualization and following infiltration with local.  The robot was then docked and instruments inserted under direct visualization.   The gallbladder seemed to be essentially retroperitoneal, completely ensconced in a fat infiltrated layer of peritoneum.  This was carefully opened and the outline of the gallbladder followed until it could be lifted out of this plane and the fundus could be grasped and retracted.  The gallbladder was retracted cephalad and the infundibulum was retracted laterally after continued gentle blunt and careful cautery dissection were able to expose this. A combination of hook electrocautery and blunt dissection was utilized to clear the peritoneum from the neck and cystic duct, circumferentially isolating the cystic artery and cystic duct and lifting the gallbladder from the cystic plate. The  critical view of safety was achieved with the cystic artery, cystic duct, and liver bed visualized between them with no other structures.  This was concordant with the view on firefly.  The artery was clipped with a single clip proximally and divided distally with cautery; the cystic duct was ligated with three clips on the proximal end and 1 distally and then divided with the scissors. The gallbladder was dissected from the liver plate using electrocautery.  Hemostasis along the liver bed was ensured with cautery as the dissection progressed.  There was a fairly redundant fat infiltrated peritoneum along the liver bed towards the fundus, and this was left in situ. Once freed the gallbladder was placed in an endocatch bag and removed intact through the left upper quadrant trocar site.  The gallbladder did rupture within the bag, spilling some bile on the field.  Hemostasis was once again confirmed, and reinspection of the abdomen revealed no injuries. The clips were well opposed without any bile leak from the duct or the liver bed on direct visualization nor on firefly. The left upper quadrant extraction port site was closed with a 0 vicryl in the fascia under direct visualization using a PMI device. The abdomen was desufflated and all trocars removed. The skin incisions were closed with subcuticular monocryl and Dermabond. The patient was awakened, extubated and transported to the recovery room in stable condition.     All counts were correct at the completion of the case.

## 2021-07-01 NOTE — H&P (Signed)
Michelle Huerta [id number]    Referring Provider:  Caren Macadam, MD     Subjective   Chief Complaint: Gallstones     History of Present Illness: 60 year old woman with multiple medical problems including chronic back pain, constipation, depression, grade 1 diastolic dysfunction, hypertension, hypothyroidism, lupus, sleep apnea, morbid obesity, Paget's disease of the bone, osteoporosis, prediabetes uterine fibroids who is referred for evaluation of gallstones. She had an episode of upper abdominal pain in early August, this came on after eating and was sharp, nonradiating and lasted for about 24 hours.  No associated emesis, fever, chills or other associated symptoms.  She has had previous similar symptoms.  Does note intermittent indigestion, queasiness/nausea in her stomach "not feeling right", denies any appetite loss, GERD, change in bowel function.  Lab work was checked at her follow-up visit with her primary care doctor on 8/12 and included a CMP which was within normal limits, and a CBC noting mildly decreased hemoglobin at 11.9 but otherwise no marked abnormalities.  Subsequent iron panel noted iron deficiency and she has subsequently undergone endoscopic evaluation as noted below.   Ultrasound 03/04/2021 demonstrates gallstones without cholecystitis.  Common bile duct 5 mm, increasing heterogenous liver echogenicity with a possible 1 cm nonspecific lesion in the left lobe may be vascular malformation   Endoscopy 04/07/2021 done for unexplained iron deficiency anemia (Dr. Janalee Dane mildly erythematous mucosa without bleeding in the entire stomach, 5 mm mucosal nodule in the duodenal bulb immediately distal to the pylorus, pulsed ulcer deformity in the duodenal bulb.  Pathology demonstrated H. pylori gastritis with intestinal metaplasia and the polyp was hyperplastic gastric type with inflammation Colonoscopy, same day with 2 polyps excised, a single medium sized angiodysplastic lesion without  bleeding in the cecum, and diverticulosis as well as internal hemorrhoids were noted.  Pathology was benign.   Previous abdominal surgery includes tubal ligation   Review of Systems: A complete review of systems was obtained from the patient.  I have reviewed this information and discussed as appropriate with the patient.  See HPI as well for other ROS.     Medical History: Past Medical History: Diagnosis        Date            Anemia                        Hypertension               Paget's bone disease  2011            Sleep apnea         Patient Active Problem List Diagnosis            H/O Paget's disease of bone            Benign essential HTN            Discoid lupus            Morbid obesity (CMS-HCC)            OSA on CPAP     History reviewed. No pertinent surgical history.    Allergies Allergen           Reactions            Penicillins        Rash            Zoledronic Acid           Nausea And Vomiting  Current Outpatient Medications on File Prior to Visit Medication       Sig       Dispense         Refill            acyclovir (ZOVIRAX) 400 MG tablet   acyclovir 400 mg tablet  TAKE 1 TABLET BY MOUTH TWICE DAILY                                   albuterol 90 mcg/actuation inhaler     albuterol sulfate HFA 90 mcg/actuation aerosol inhaler  INHALE 2 PUFFS INTO THE LUNGS EVERY 6 HOURS AS NEEDED FOR WHEEZING/SHORTNESS OF BREATH                              cyclobenzaprine (FLEXERIL) 10 MG tablet   cyclobenzaprine 10 mg tablet  TAKE 1 TABLET BY MOUTH EVERY DAY AT BEDTIME AS NEEDED                            ergocalciferol, vitamin D2, 1,250 mcg (50,000 unit) capsule ergocalciferol (vitamin D2) 1,250 mcg (50,000 unit) capsule  TAKE 1 CAPSULE BY MOUTH 1 TIME A WEEK                            ibuprofen (MOTRIN) 800 MG tablet   ibuprofen 800 mg tablet  TAKE 1 TABLET BY MOUTH WITH FOOD OR MILK 2 TIMES A DAY AS NEEDED                                ketoconazole  (NIZORAL) 2 % cream ketoconazole 2 % topical cream  APPLY TOPICALLY TO AFFECTED AREA TWICE DAILY AS NEEDED.                         metFORMIN (GLUCOPHAGE) 500 MG tablet          metformin 500 mg tablet  TAKE 1 TABLET BY MOUTH EVERY DAY                          oxybutynin (DITROPAN-XL) 10 MG XL tablet           oxybutynin chloride ER 10 mg tablet,extended release 24 hr  TAKE 1 TABLET BY MOUTH EVERY DAY                          amLODIPine (NORVASC) 5 MG tablet          Take 5 mg by mouth once daily                     5            cetirizine (ZYRTEC) 10 mg capsule   Take 10 mg by mouth once daily                               hydroxychloroquine (PLAQUENIL) 200 mg tablet     Take 200 mg by mouth once daily                3  omeprazole (PRILOSEC) 40 MG DR capsule           omeprazole 40 mg capsule,delayed release  TAKE 1 CAPSULE BY MOUTH EVERY DAY 30 MINUTES BEFORE BREAKFAST                                 telmisartan (MICARDIS) 80 MG tablet           Take 80 mg by mouth once daily                   1            temazepam (RESTORIL) 30 mg capsule       TAKE 1 CAPSULE BY MOUTH AT BEDTIME AS NEEDED                     1            triamcinolone 0.1 % cream     triamcinolone acetonide 0.1 % topical cream  APPLY TOPICALLY TO THE AFFECTED AREA TWICE DAILY AS NEEDED                              valACYclovir (VALTREX) 500 MG tablet       Take 500 mg by mouth once daily                 3            zolpidem (AMBIEN) 10 mg tablet       zolpidem 10 mg tablet  TAKE 1 TABLET BY MOUTH EVERY DAY AT BEDTIME AS NEEDED                   No current facility-administered medications on file prior to visit.     Family History Problem           Relation           Age of Onset            High blood pressure (Hypertension)   Mother             High blood pressure (Hypertension)   Father              Paget's disease of bone         Sister        Social History   Tobacco Use Smoking Status            Former Smoker Smokeless Tobacco   Former Systems developer     Social History   Socioeconomic History            Marital status:  Single Tobacco Use            Smoking status:          Former Smoker            Smokeless tobacco:    Former Pension scheme manager Use:    Never used Substance and Sexual Activity            Alcohol use:    Yes            Drug use:        Defer            Sexual activity:  Defer     Objective:     Vitals:             04/15/21 0906 Pulse:  68 Temp:  36.7 C (98 F) SpO2:  99% Weight:            (!) 128.4 kg (283 lb) Height: 168.9 cm (5' 6.5")   Body mass index is 44.99 kg/m.   Alert, no distress Unlabored respirations Abdomen is obese, nontender   Assessment and Plan: Diagnoses and all orders for this visit:   Biliary colic -     CCS Case Posting Request; Future     I recommend proceeding with laparoscopic or robotic cholecystectomy with possible cholangiogram.  Discussed the relevant anatomy using a diagram to demonstrate, and went over surgical technique.  Discussed risks of surgery including bleeding, pain, scarring, intraabdominal injury specifically to the common bile duct and sequelae, bile leak, conversion to open surgery, failure to resolve symptoms, blood clots/ pulmonary embolus, heart attack, pneumonia, stroke, death. Questions welcomed and answered to patient's satisfaction.  She will contact [facility] to follow-up on treatment for the H. pylori as she has not started this yet.   Tatym Schermer Raquel James, MD

## 2021-07-01 NOTE — Anesthesia Procedure Notes (Signed)
Procedure Name: Intubation Date/Time: 07/01/2021 2:09 PM Performed by: Cynda Familia, CRNA Pre-anesthesia Checklist: Patient identified, Emergency Drugs available, Suction available and Patient being monitored Patient Re-evaluated:Patient Re-evaluated prior to induction Oxygen Delivery Method: Circle System Utilized Preoxygenation: Pre-oxygenation with 100% oxygen Induction Type: IV induction Ventilation: Mask ventilation without difficulty Laryngoscope Size: Miller and 2 Grade View: Grade I Tube type: Oral Number of attempts: 1 Airway Equipment and Method: Stylet Placement Confirmation: ETT inserted through vocal cords under direct vision, positive ETCO2 and breath sounds checked- equal and bilateral Secured at: 22 cm Tube secured with: Tape Dental Injury: Teeth and Oropharynx as per pre-operative assessment  Comments: IV induction Houser- intubation AM CRNA atraumatic- teeth and mouth as preop- bilat BS

## 2021-07-01 NOTE — Discharge Instructions (Signed)
LAPAROSCOPIC SURGERY: POST OP INSTRUCTIONS   EAT Gradually transition to a high fiber diet with a fiber supplement over the next few weeks after discharge.  Start with a pureed / full liquid diet (see below)  WALK Walk an hour a day (cumulative, not all at once).  Control your pain to do that.    CONTROL PAIN Control pain so that you can walk, sleep, tolerate sneezing/coughing, go up/down stairs.  HAVE A BOWEL MOVEMENT DAILY Keep your bowels regular to avoid problems.  OK to try a laxative to override constipation.  OK to use an antidairrheal to slow down diarrhea.  Call if not better after 2 tries  CALL IF YOU HAVE PROBLEMS/CONCERNS Call if you are still struggling despite following these instructions. Call if you have concerns not answered by these instructions    DIET: Follow a light bland diet & liquids the first 24 hours after arrival home, such as soup, liquids, starches, etc.  Be sure to drink plenty of fluids.  Quickly advance to a usual solid diet within a few days.  Avoid fast food or heavy meals as your are more likely to get nauseated or have irregular bowels.  A low-sugar, high-fiber diet for the rest of your life is ideal.  Take your usually prescribed home medications unless otherwise directed.  PAIN CONTROL: Pain is best controlled by a usual combination of three different methods TOGETHER: Ice/Heat Over the counter pain medication Prescription pain medication Most patients will experience some swelling and bruising around the incisions.  Ice packs or heating pads (30-60 minutes up to 6 times a day) will help. Use ice for the first few days to help decrease swelling and bruising, then switch to heat to help relax tight/sore spots and speed recovery.  Some people prefer to use ice alone, heat alone, alternating between ice & heat.  Experiment to what works for you.  Swelling and bruising can take several weeks to resolve.   It is helpful to take an over-the-counter pain  medication regularly for the first few days: Naproxen (Aleve, etc)  Two 220mg  tabs twice a day OR Ibuprofen (Advil, etc) Three 200mg  tabs four times a day (every meal & bedtime) AND Acetaminophen (Tylenol, etc) 500-650mg  four times a day (every meal & bedtime) A  prescription for pain medication (such as oxycodone, hydrocodone, tramadol, gabapentin, methocarbamol, etc) should be given to you upon discharge.  Take your pain medication as prescribed, IF NEEDED.  If you are having problems/concerns with the prescription medicine (does not control pain, nausea, vomiting, rash, itching, etc), please call us [phone] to see if we need to switch you to a different pain medicine that will work better for you and/or control your side effect better. If you need a refill on your pain medication, please give [place] 48 hour notice.  contact your pharmacy.  They will contact our office to request authorization. Prescriptions will not be filled after 5 pm or on week-ends  Avoid getting constipated.   Between the surgery and the pain medications, it is common to experience some constipation.   Increasing fluid intake and taking a fiber supplement (such as Metamucil, Citrucel, FiberCon, MiraLax, etc) 1-2 times a day regularly will usually help prevent this problem from occurring.   A mild laxative (prune juice, Milk of Magnesia, MiraLax, etc) should be taken according to package directions if there are no bowel movements after 48 hours.   Watch out for diarrhea.   If you have many loose  bowel movements, simplify your diet to bland foods & liquids for a few days.   Stop any stool softeners and decrease your fiber supplement.   Switching to mild anti-diarrheal medications (Kayopectate, Pepto Bismol) can help.   If this worsens or does not improve, please call us.  Wash / shower every day.  You may shower over the skin glue which is waterproof  Glue will flake off after about 2 weeks.  You may leave the incision  open to air.  You may replace a dressing/Band-Aid to cover the incision for comfort if you wish.   ACTIVITIES as tolerated:   You may resume regular (light) daily activities beginning the next day--such as daily self-care, walking, climbing stairs--gradually increasing activities as tolerated.  If you can walk 30 minutes without difficulty, it is safe to try more intense activity such as jogging, treadmill, bicycling, low-impact aerobics, swimming, etc. Save the most intensive and strenuous activity for last such as sit-ups, heavy lifting, contact sports, etc  Refrain from any heavy lifting or straining until you are off narcotics for pain control.   DO NOT PUSH THROUGH PAIN.  Let pain be your guide: If it hurts to do something, don't do it.  Pain is your body warning you to avoid that activity for another week until the pain goes down. You may drive when you are no longer taking prescription pain medication, you can comfortably wear a seatbelt, and you can safely maneuver your car and apply brakes. You may have sexual intercourse when it is comfortable.  FOLLOW UP in our office Please call [facility] at [phone] to set up an appointment to see your surgeon in the office for a follow-up appointment approximately 2-3 weeks after your surgery. Make sure that you call for this appointment the day you arrive home to insure a convenient appointment time.  10. IF YOU HAVE DISABILITY OR FAMILY LEAVE FORMS, BRING THEM TO THE OFFICE FOR PROCESSING.  DO NOT GIVE THEM TO YOUR DOCTOR.   WHEN TO CALL us [phone]: Poor pain control Reactions / problems with new medications (rash/itching, nausea, etc)  Fever over 101.5 F (38.5 C) Inability to urinate Nausea and/or vomiting Worsening swelling or bruising Continued bleeding from incision. Increased pain, redness, or drainage from the incision   The clinic staff is available to answer your questions during regular business hours (8:30am-5pm).  Please  dont hesitate to call and ask to speak to one of our nurses for clinical concerns.   If you have a medical emergency, go to the nearest emergency room or call 911.  A surgeon from [facility] is always on call at the [facility], [place], [place], [place], [place]  [address] ? MAIN: [phone] ? TOLL FREE: [phone] ?  FAX [phone] [email or web address]

## 2021-07-01 NOTE — Anesthesia Procedure Notes (Signed)
Date/Time: 07/01/2021 3:27 PM Performed by: Cynda Familia, CRNA Oxygen Delivery Method: Simple face mask Placement Confirmation: positive ETCO2 and breath sounds checked- equal and bilateral Dental Injury: Teeth and Oropharynx as per pre-operative assessment

## 2021-07-02 LAB — SURGICAL PATHOLOGY

## 2021-07-06 NOTE — Anesthesia Postprocedure Evaluation (Signed)
Anesthesia Post Note  Patient: Michelle Huerta  Procedure(s) Performed: XI ROBOTIC ASSISTED LAPAROSCOPIC CHOLECYSTECTOMY (Abdomen)     Patient location during evaluation: PACU Anesthesia Type: General Level of consciousness: awake and alert Pain management: pain level controlled Vital Signs Assessment: post-procedure vital signs reviewed and stable Respiratory status: spontaneous breathing, nonlabored ventilation, respiratory function stable and patient connected to nasal cannula oxygen Cardiovascular status: blood pressure returned to baseline and stable Postop Assessment: no apparent nausea or vomiting Anesthetic complications: no   No notable events documented.  Last Vitals:  Vitals:   07/01/21 1600 07/01/21 1615  BP: 120/74 (!) 154/85  Pulse: 83 87  Resp: 12   Temp: 36.6 C   SpO2: 98% 98%    Last Pain:  Vitals:   07/01/21 1716  TempSrc:   PainSc: 0-No pain                 Barnet Glasgow

## 2021-07-21 ENCOUNTER — Telehealth: Payer: Self-pay | Admitting: Cardiovascular Disease

## 2021-07-21 NOTE — Telephone Encounter (Signed)
STAT if HR is under 50 or over 120 (normal HR is 60-100 beats per minute)  What is your heart rate? 120 yesterday  Do you have a log of your heart rate readings (document readings)? Yes   Do you have any other symptoms? Unexplainable feeling

## 2021-07-21 NOTE — Telephone Encounter (Signed)
Returned call to patient to speak about rapid heart rate.   Patient states she has been wearing her apple watch. Had her at 120 yesterday and today. Patient denies a racing heart feeling or chest pain.    Patient states she was given an inhaler for asthma by another doctor, but has not used it like she was supposed to for a few days and noticed that her breathing has gotten worse. RN advised patient that she should resume using inhaler as prescribed and follow up with the doctor that prescribed it if she is not feeling better.

## 2021-07-22 ENCOUNTER — Other Ambulatory Visit: Payer: Self-pay | Admitting: Family Medicine

## 2021-07-22 DIAGNOSIS — Z1231 Encounter for screening mammogram for malignant neoplasm of breast: Secondary | ICD-10-CM

## 2021-08-25 ENCOUNTER — Other Ambulatory Visit: Payer: Self-pay

## 2021-08-25 ENCOUNTER — Ambulatory Visit
Admission: RE | Admit: 2021-08-25 | Discharge: 2021-08-25 | Disposition: A | Discharge status: Home / Self Care | Payer: Medicare Other | Source: Ambulatory Visit | Attending: Family Medicine | Admitting: Family Medicine

## 2021-08-25 DIAGNOSIS — Z1231 Encounter for screening mammogram for malignant neoplasm of breast: Secondary | ICD-10-CM

## 2021-10-28 DIAGNOSIS — K8020 Calculus of gallbladder without cholecystitis without obstruction: Secondary | ICD-10-CM | POA: Insufficient documentation

## 2021-10-28 NOTE — Progress Notes (Signed)
? ? ?10/29/2021 ?[name] ?[id number] ?[birth year] ? ?Referring provider: Harlan Stains, MD ?Primary GI doctor: Dr. Fuller Plan ? ?ASSESSMENT AND PLAN:  ? ?Abdominal pain, unspecified abdominal location ?? AB discomfort versus abnormal noises ?With family history of cirrhosis in son and mother will get AB [place] and recheck LFTs ?If abnormal may want to do complete hepatocellular work up.  ?If negative likely concern/noises is from constipation, given information about how to treat ?3 month OV ?-     US Abdomen Complete; Future ?-     CBC with Differential/Platelet; Future ?-     Comprehensive metabolic panel; Future ? ?Gastroesophageal reflux disease, unspecified whether esophagitis present with history of H pylori ?Unable to tolerate quadruple therapy ?-     metroNIDAZOLE (FLAGYL) 500 MG tablet; Take 1 tablet (500 mg total) by mouth 3 (three) times daily for 14 days. ?-     clarithromycin (BIAXIN) 500 MG tablet; Take 1 tablet (500 mg total) by mouth 2 (two) times daily for 14 days. ?Will do PPI which patient has and will take daily, flagyl with PCN allergy and clarithromycin.  ?Emphasize need to complete the pack, if they have any issues let us know.  ?Most common side effects include nausea, vomiting, diarrhea, stomach upset and rash.  ?Avoid alcohol use while on antibiotics.  ?Need to start Florastor probiotics twice a day.   ?Follow up 8 weeks for repeat H pylori testing with stool off PPI to ensure eradication. ? ?Hx of adenomatous colonic polyps ?04/07/2021 EGD colon For IDA ?Erythematous mucosa in stomach, mucosal nodule duodenum, duodenal deformity, biopsy showed H. pylori gastritis ?Two 6 to 9 mm sessile serrated polyps, single nonbleeding AVM status post APC, diverticulosis and internal hemorrhoids.   ?Recall 5 years 03/2026 ? ? ?History of Present Illness:  ?61 y.o. female  with a past medical history OSA on CPAP, morbid obesity, family history of colon polyps (mother), a personal history of one colon tubular  adenoma in 2012 and a cecal AVM on colonoscopy in 2012, IDA , GERD with history of H. pylori treated with quadruple therapy 05/04/2021 and others listed below, returns to clinic today for evaluation of abdominal pain lower quadrant.  ? ?Patient states she took one days worth, did not take any of the quadruple therapy for the H pylori.  ?Metronidazole 500 mg TID rather than amoxicillin due to allergy.  ?Son and mother both recently diagnosed with cirrhosis, son with sarcoidosis.   ? ?Patient states she is afraid she has fluid on her AB because she has a "gush" of fluid when she pushes on her AB or when she bends over.  ?Has noticed x 6 months.  ?She has constipation, takes probiotic and stool softner which helps, has BM every other day.  ?She states no increase in AB size, some swelling in her legs at night.  ?She does have SOB, no chest pain.  ?Denies AB pain.  ?Denies nausea, vomiting.  ? ?HGB 11.8 Platelets 199 ?AST 20 ALT 24  ?Alkphos 99 TBili 0.6 ?GFR >60  ? ?03/18/2021 office visit with Dr. Fuller Plan for iron deficiency anemia ?04/07/2021 EGD colon For IDA ?Erythematous mucosa in stomach, mucosal nodule duodenum, duodenal deformity, biopsy showed H. pylori gastritis ?Two 6 to 9 mm sessile serrated polyps, single nonbleeding AVM status post APC, diverticulosis and internal hemorrhoids.  Recall 5 years 03/2026 ?03/04/2021 abdominal ultrasound showing cholelithiasis without inflammation, without biliary dilatation, hepatic steatosis, s/p cholestectomy 06/2021.  ? ?Current Medications:  ? ?Current Outpatient Medications (Endocrine &  Metabolic):  ?  metFORMIN (GLUCOPHAGE) 500 MG tablet, TAKE 1 TABLET (500 MG TOTAL) BY MOUTH 2 (TWO) TIMES DAILY WITH A MEAL. (Patient taking differently: Take 500 mg by mouth daily.) ? ?Current Outpatient Medications (Cardiovascular):  ?  telmisartan-hydrochlorothiazide (MICARDIS HCT) 80-12.5 MG tablet, Take 1 tablet by mouth daily. ?  amLODipine (NORVASC) 10 MG tablet, TAKE 1 TABLET BY  MOUTH ONCE A DAY ? ?Current Outpatient Medications (Respiratory):  ?  albuterol (VENTOLIN HFA) 108 (90 Base) MCG/ACT inhaler, INHALE 2 PUFFS INTO THE LUNGS EVERY 6 HOURS AS NEEDED FOR WHEEZING OR SHORTNESS OF BREATH. ?  cetirizine (ZYRTEC) 10 MG tablet, Take 10 mg by mouth daily. ?  fluticasone (FLONASE) 50 MCG/ACT nasal spray, Place 2 sprays into both nostrils daily. (Patient taking differently: Place 2 sprays into both nostrils daily as needed for allergies.) ? ?Current Outpatient Medications (Analgesics):  ?  acetaminophen (TYLENOL) 500 MG tablet, Take 1,000 mg by mouth every 6 (six) hours as needed for moderate pain. ?  ibuprofen (ADVIL) 800 MG tablet, Take 800 mg by mouth every 8 (eight) hours as needed for mild pain or moderate pain. ?  traMADol (ULTRAM) 50 MG tablet, Take 1 tablet (50 mg total) by mouth every 6 (six) hours as needed. ? ? ?Current Outpatient Medications (Other):  ?  acyclovir (ZOVIRAX) 400 MG tablet, Take 400 mg by mouth 2 (two) times daily. ?  ARTIFICIAL TEAR SOLUTION OP, Place 1 drop into both eyes daily. ?  clarithromycin (BIAXIN) 500 MG tablet, Take 1 tablet (500 mg total) by mouth 2 (two) times daily for 14 days. ?  cyclobenzaprine (FLEXERIL) 10 MG tablet, Take 10 mg by mouth at bedtime as needed for muscle spasms. ?  docusate sodium (COLACE) 100 MG capsule, Take 100 mg by mouth daily. ?  famotidine (PEPCID) 20 MG tablet, Take 20 mg by mouth at bedtime. ?  Melatonin 10 MG CAPS, Take 20 mg by mouth at bedtime. ?  metroNIDAZOLE (FLAGYL) 500 MG tablet, Take 1 tablet (500 mg total) by mouth 3 (three) times daily for 14 days. ?  Multiple Vitamins-Minerals (ADULT GUMMY PO), Take 1 capsule by mouth daily. alive ?  omeprazole (PRILOSEC) 40 MG capsule, Take 1 capsule (40 mg total) by mouth 2 (two) times daily. (Patient taking differently: Take 40 mg by mouth daily.) ?  oxybutynin (DITROPAN-XL) 10 MG 24 hr tablet, Take 10 mg by mouth daily. ?  Probiotic Product (PROBIOTIC PO), Take 1 capsule by  mouth daily. ?  Vitamin D, Ergocalciferol, (DRISDOL) 1.25 MG (50000 UNIT) CAPS capsule, Take 1 capsule (50,000 Units total) by mouth every 7 (seven) days. ?  zolpidem (AMBIEN) 10 MG tablet, Take 10 mg by mouth every other day. At bedtime ? ?Surgical History:  ?She  has a past surgical history that includes Tubal ligation (1991); Breast reduction surgery (Bilateral, 1999); Toe Surgery; Dilation and curettage of uterus (N/A, 06/24/2015); Augmentation mammaplasty (Bilateral, 2001); Colonoscopy (01/19/2016); Knee arthroscopy with subchondroplasty (Right, 05/03/2018); Polypectomy; and Reduction mammaplasty. ?Family History:  ?Her family history includes Allergic rhinitis in her mother; Cancer in her maternal grandmother; Heart disease in her mother; Hypertension in her father, mother, sister, son, and son; Stomach cancer in her paternal grandmother. ?Social History:  ? reports that she quit smoking about 13 years ago. Her smoking use included cigars. She has never used smokeless tobacco. She reports current alcohol use of about 5.0 standard drinks per week. She reports that she does not use drugs. ? ?Current Medications, Allergies, Past Medical  History, Past Surgical History, Family History and Social History were reviewed in [employer] record. ? ?Physical Exam: ?BP 120/76   Pulse 70   Ht '5\' 6"'$  (1.676 m)   Wt 282 lb 6.4 oz (128.1 kg)   LMP 08/01/2012   SpO2 99%   BMI 45.58 kg/m?  ?General:   Pleasant, well developed female in no acute distress ?Eyes: No sclera icterus ?Heart:   Irregular, irregular rhythm, normal rate, with no murmurs.  ?Pulm: Clear anteriorly; no wheezing ?Abdomen:  Soft, Obese AB, Sluggish bowel sounds. No tenderness . , No organomegaly appreciated. No dullness in flanks.  ?Extremities:  without  edema. ?Neurologic:  Alert and  oriented x4;  No focal deficits.  ?Psych:  Cooperative. Normal mood and affect. ? ? ?Vladimir Crofts, PA-C ?10/29/21 ?

## 2021-10-29 ENCOUNTER — Ambulatory Visit: Payer: Medicare Other | Admitting: Physician Assistant

## 2021-10-29 ENCOUNTER — Other Ambulatory Visit (INDEPENDENT_AMBULATORY_CARE_PROVIDER_SITE_OTHER): Payer: Medicare Other

## 2021-10-29 ENCOUNTER — Other Ambulatory Visit: Payer: Self-pay

## 2021-10-29 ENCOUNTER — Encounter: Payer: Self-pay | Admitting: Physician Assistant

## 2021-10-29 VITALS — BP 120/76 | HR 70 | Ht 66.0 in | Wt 282.4 lb

## 2021-10-29 DIAGNOSIS — R109 Unspecified abdominal pain: Secondary | ICD-10-CM | POA: Diagnosis not present

## 2021-10-29 DIAGNOSIS — A048 Other specified bacterial intestinal infections: Secondary | ICD-10-CM

## 2021-10-29 DIAGNOSIS — D509 Iron deficiency anemia, unspecified: Secondary | ICD-10-CM

## 2021-10-29 DIAGNOSIS — K8020 Calculus of gallbladder without cholecystitis without obstruction: Secondary | ICD-10-CM

## 2021-10-29 DIAGNOSIS — K760 Fatty (change of) liver, not elsewhere classified: Secondary | ICD-10-CM

## 2021-10-29 DIAGNOSIS — K219 Gastro-esophageal reflux disease without esophagitis: Secondary | ICD-10-CM

## 2021-10-29 DIAGNOSIS — Z86010 Personal history of colonic polyps: Secondary | ICD-10-CM

## 2021-10-29 LAB — COMPREHENSIVE METABOLIC PANEL
ALT: 33 U/L (ref 0–35)
AST: 27 U/L (ref 0–37)
Albumin: 3.8 g/dL (ref 3.5–5.2)
Alkaline Phosphatase: 75 U/L (ref 39–117)
BUN: 17 mg/dL (ref 6–23)
CO2: 26 mEq/L (ref 19–32)
Calcium: 9.3 mg/dL (ref 8.4–10.5)
Chloride: 103 mEq/L (ref 96–112)
Creatinine, Ser: 0.78 mg/dL (ref 0.40–1.20)
GFR: 83.31 mL/min (ref >60.00)
Glucose, Bld: 88 mg/dL (ref 70–99)
Potassium: 4 mEq/L (ref 3.5–5.1)
Sodium: 137 mEq/L (ref 135–145)
Total Bilirubin: 0.6 mg/dL (ref 0.2–1.2)
Total Protein: 8.1 g/dL (ref 6.0–8.3)

## 2021-10-29 LAB — CBC WITH DIFFERENTIAL/PLATELET
Basophils Absolute: 0 10*3/uL (ref 0.0–0.1)
Basophils Relative: 0.8 % (ref 0.0–3.0)
Eosinophils Absolute: 0.1 10*3/uL (ref 0.0–0.7)
Eosinophils Relative: 1.8 % (ref 0.0–5.0)
HCT: 36 % (ref 36.0–46.0)
Hemoglobin: 11.7 g/dL — ABNORMAL LOW (ref 12.0–15.0)
Lymphocytes Relative: 40.1 % (ref 12.0–46.0)
Lymphs Abs: 2.3 10*3/uL (ref 0.7–4.0)
MCHC: 32.6 g/dL (ref 30.0–36.0)
MCV: 83.3 fl (ref 78.0–100.0)
Monocytes Absolute: 0.4 10*3/uL (ref 0.1–1.0)
Monocytes Relative: 6.7 % (ref 3.0–12.0)
Neutro Abs: 2.9 10*3/uL (ref 1.4–7.7)
Neutrophils Relative %: 50.6 % (ref 43.0–77.0)
Platelets: 131 10*3/uL — ABNORMAL LOW (ref 150.0–400.0)
RBC: 4.32 Mil/uL (ref 3.87–5.11)
RDW: 15 % (ref 11.5–15.5)
WBC: 5.7 10*3/uL (ref 4.0–10.5)

## 2021-10-29 LAB — VITAMIN B12: Vitamin B-12: 321 pg/mL (ref 211–911)

## 2021-10-29 LAB — IRON: Iron: 67 ug/dL (ref 42–145)

## 2021-10-29 MED ORDER — METRONIDAZOLE 500 MG PO TABS: 500.0000 mg | ORAL_TABLET | Freq: Three times a day (TID) | ORAL | 0 refills | Status: AC

## 2021-10-29 MED ORDER — CLARITHROMYCIN 500 MG PO TABS: 500.0000 mg | ORAL_TABLET | Freq: Two times a day (BID) | ORAL | 0 refills | Status: AC

## 2021-10-29 NOTE — Patient Instructions (Addendum)
Please complete the antibiotics for H pylori, will try triple therapy without ABX since patient  ?H pylori is bacterial infection that can cause ulcers and if not treated lead to increased risk of stomach cancer.  ? ?Clarithromycin 500 mg BID + Metronidazole '500mg'$  TID+ take the omeprazole 40 mg once daily x 14 days  ? ?Emphasize need to complete the pack, if they have any issues let us know.  ?Most common side effects include nausea, vomiting, diarrhea, stomach upset and rash.  ?Avoid alcohol use while on antibiotics.  ?Need to start Florastor probiotics twice a day.   ?Follow up 8 weeks for repeat H pylori testing with stool off PPI to ensure eradication. We will contact you to remind you to come to the lab. ? ?Recommend starting on a fiber supplement, can try metamucil first but if this causes gas/bloating switch to benefiber ?Take with fiber with with a full 8 oz glass of water once a day. This can take 1 month to start helping, so try for at least one month.  ?Recommend increasing water and activity.  ?Get a squatty potty to use at home or try a stool, goal is to get your knees above your hips during a bowel movement. ? ?- Drink at least 64-80 ounces of water/liquid per day. ?- Establish a time to try to move your bowels every day.  For many people, this is after a cup of coffee or after a meal such as breakfast. ?- Sit all of the way back on the toilet keeping your back fairly straight and while sitting up, try to rest the tops of your forearms on your upper thighs.   ?- Raising your feet with a step stool/squatty potty can be helpful to improve the angle that allows your stool to pass through the rectum. ?- Relax the rectum feeling it bulge toward the toilet water.  If you feel your rectum raising toward your body, you are contracting rather than relaxing. ?- Breathe in and slowly exhale. "Belly breath" by expanding your belly towards your belly button. Keep belly expanded as you gently direct pressure down  and back to the anus.  A low pitched GRRR sound can assist with increasing intra-abdominal pressure.  ?- Repeat 3-4 times. If unsuccessful, contract the pelvic floor to restore normal tone and get off the toilet.  Avoid excessive straining. ?- To reduce excessive wiping by teaching your anus to normally contract, place hands on outer aspect of knees and resist knee movement outward.  Hold 5-10 second then place hands just inside of knees and resist inward movement of knees.  Hold 5 seconds.  Repeat a few times each way. ? ?Go to the ER if unable to pass gas, severe AB pain, unable to hold down food, any shortness of breath of chest pain.  ? ?You have been scheduled for an abdominal ultrasound at [facility] (1st floor of hospital) on 11/02/21 at 8:30 am. Please arrive 15 minutes prior to your appointment for registration. Make certain not to have anything to eat or drink 6 hours prior to your appointment. Should you need to reschedule your appointment, please contact radiology at [phone]. This test typically takes about 30 minutes to perform. ? ? ?Constipation, Adult ?Constipation is when a person has fewer than three bowel movements in a week, has difficulty having a bowel movement, or has stools (feces) that are dry, hard, or larger than normal. Constipation may be caused by an underlying condition. It may become worse  with age if a person takes certain medicines and does not take in enough fluids. ?Follow these instructions at home: ?Eating and drinking ? ?Eat foods that have a lot of fiber, such as beans, whole grains, and fresh fruits and vegetables. ?Limit foods that are low in fiber and high in fat and processed sugars, such as fried or sweet foods. These include french fries, hamburgers, cookies, candies, and soda. ?Drink enough fluid to keep your urine pale yellow. ?General instructions ?Exercise regularly or as told by your health care provider. Try to do 150 minutes of moderate exercise each  week. ?Use the bathroom when you have the urge to go. Do not hold it in. ?Take over-the-counter and prescription medicines only as told by your health care provider. This includes any fiber supplements. ?During bowel movements: ?Practice deep breathing while relaxing the lower abdomen. ?Practice pelvic floor relaxation. ?Watch your condition for any changes. Let your health care provider know about them. ?Keep all follow-up visits as told by your health care provider. This is important. ?Contact a health care provider if: ?You have pain that gets worse. ?You have a fever. ?You do not have a bowel movement after 4 days. ?You vomit. ?You are not hungry or you lose weight. ?You are bleeding from the opening between the buttocks (anus). ?You have thin, pencil-like stools. ?Get help right away if: ?You have a fever and your symptoms suddenly get worse. ?You leak stool or have blood in your stool. ?Your abdomen is bloated. ?You have severe pain in your abdomen. ?You feel dizzy or you faint. ?Summary ?Constipation is when a person has fewer than three bowel movements in a week, has difficulty having a bowel movement, or has stools (feces) that are dry, hard, or larger than normal. ?Eat foods that have a lot of fiber, such as beans, whole grains, and fresh fruits and vegetables. ?Drink enough fluid to keep your urine pale yellow. ?Take over-the-counter and prescription medicines only as told by your health care provider. This includes any fiber supplements. ?This information is not intended to replace advice given to you by your health care provider. Make sure you discuss any questions you have with your health care provider. ?Document Revised: 05/15/2019 Document Reviewed: 05/15/2019 ?Elsevier Patient Education ? 2022 [place]. ? ? ?

## 2021-11-01 ENCOUNTER — Other Ambulatory Visit: Payer: Self-pay

## 2021-11-01 DIAGNOSIS — D509 Iron deficiency anemia, unspecified: Secondary | ICD-10-CM

## 2021-11-02 ENCOUNTER — Ambulatory Visit (HOSPITAL_COMMUNITY)
Admission: RE | Admit: 2021-11-02 | Discharge: 2021-11-02 | Disposition: A | Discharge status: Home / Self Care | Payer: Medicare Other | Source: Ambulatory Visit | Attending: Physician Assistant | Admitting: Physician Assistant

## 2021-11-02 DIAGNOSIS — R109 Unspecified abdominal pain: Secondary | ICD-10-CM | POA: Diagnosis present

## 2021-11-03 NOTE — Addendum Note (Signed)
Addended by: Vicie Mutters on: 11/03/2021 08:10 AM ? ? Modules accepted: Orders ? ?

## 2021-11-04 ENCOUNTER — Other Ambulatory Visit (INDEPENDENT_AMBULATORY_CARE_PROVIDER_SITE_OTHER): Payer: Medicare Other

## 2021-11-04 DIAGNOSIS — K760 Fatty (change of) liver, not elsewhere classified: Secondary | ICD-10-CM

## 2021-11-04 DIAGNOSIS — D509 Iron deficiency anemia, unspecified: Secondary | ICD-10-CM

## 2021-11-04 LAB — PROTIME-INR
INR: 1 ratio (ref 0.8–1.0)
Prothrombin Time: 11.5 s (ref 9.6–13.1)

## 2021-11-04 LAB — FERRITIN: Ferritin: 115.6 ng/mL (ref 10.0–291.0)

## 2021-11-05 LAB — IRON AND TIBC
Iron Saturation: 27 % (ref 15–55)
Iron: 71 ug/dL (ref 27–159)
Total Iron Binding Capacity: 260 ug/dL (ref 250–450)
UIBC: 189 ug/dL (ref 131–425)

## 2021-11-09 LAB — ANTI-NUCLEAR AB-TITER (ANA TITER)
ANA TITER: 1:320 {titer} — ABNORMAL HIGH
ANA Titer 1: 1:40 {titer} — ABNORMAL HIGH

## 2021-11-09 LAB — MITOCHONDRIAL ANTIBODIES: Mitochondrial M2 Ab, IgG: <=20 U (ref <=20.0)

## 2021-11-09 LAB — ANA: Anti Nuclear Antibody (ANA): POSITIVE — AB

## 2021-11-09 LAB — IGG: IgG (Immunoglobin G), Serum: 2375 mg/dL — ABNORMAL HIGH (ref 600–1640)

## 2021-11-09 LAB — ANTI-SMOOTH MUSCLE ANTIBODY, IGG: Actin (Smooth Muscle) Antibody (IGG): <20 U (ref <20)

## 2021-12-16 ENCOUNTER — Telehealth: Payer: Self-pay | Admitting: Physician Assistant

## 2021-12-16 NOTE — Telephone Encounter (Signed)
I have spoke with the pt and she is aware that she will stop PPI for 2 weeks then return the stool sample.  She has verbalized understanding of the instruction and will call back with any further questions.

## 2021-12-16 NOTE — Telephone Encounter (Signed)
Patient called and stated she was supposed to do a stool sample, but can't find her paper that told her when she needed to do it upon completion of her antibiotics.  Please call patient and advise.  Thank you.

## 2022-01-03 ENCOUNTER — Other Ambulatory Visit: Payer: Medicare Other

## 2022-01-03 DIAGNOSIS — A048 Other specified bacterial intestinal infections: Secondary | ICD-10-CM

## 2022-01-04 LAB — HELICOBACTER PYLORI  SPECIAL ANTIGEN
MICRO NUMBER:: 13571923
RESULT:: DETECTED — AB
SPECIMEN QUALITY: ADEQUATE

## 2022-01-05 ENCOUNTER — Other Ambulatory Visit: Payer: Self-pay

## 2022-01-05 DIAGNOSIS — A048 Other specified bacterial intestinal infections: Secondary | ICD-10-CM

## 2022-01-05 MED ORDER — OMEPRAZOLE 40 MG PO CPDR: 40.0000 mg | DELAYED_RELEASE_CAPSULE | Freq: Two times a day (BID) | ORAL | 2 refills | Status: DC

## 2022-01-05 MED ORDER — BISMUTH SUBSALICYLATE 262 MG PO TABS: 524.0000 mg | ORAL_TABLET | Freq: Four times a day (QID) | ORAL | 0 refills | Status: AC

## 2022-01-05 MED ORDER — DOXYCYCLINE HYCLATE 100 MG PO CAPS
100.0000 mg | ORAL_CAPSULE | Freq: Two times a day (BID) | ORAL | 0 refills | Status: AC
Start: 2022-01-05 — End: 2022-01-19

## 2022-01-05 MED ORDER — METRONIDAZOLE 250 MG PO TABS
250.0000 mg | ORAL_TABLET | Freq: Four times a day (QID) | ORAL | 0 refills | Status: AC
Start: 2022-01-05 — End: 2022-01-19

## 2022-01-20 ENCOUNTER — Telehealth: Payer: Self-pay | Admitting: Physician Assistant

## 2022-01-20 NOTE — Telephone Encounter (Signed)
Spoke with patient & made her aware that she is currently taking pepto for H. Pylori treatment which can make her stools dark. All questions answered in regards to her medication regimen & when to come back for stool sample. Patient advised to call back with any questions.

## 2022-01-24 ENCOUNTER — Telehealth: Payer: Self-pay

## 2022-01-24 NOTE — Telephone Encounter (Signed)
Patient came in to the office to discuss H. Pylori medication regimen. She has been provided written & verbal information, and was able to verbalize all understanding.

## 2022-02-16 ENCOUNTER — Encounter (INDEPENDENT_AMBULATORY_CARE_PROVIDER_SITE_OTHER): Payer: Self-pay

## 2022-03-02 ENCOUNTER — Encounter (HOSPITAL_BASED_OUTPATIENT_CLINIC_OR_DEPARTMENT_OTHER): Payer: Self-pay | Admitting: Cardiovascular Disease

## 2022-03-02 ENCOUNTER — Ambulatory Visit (HOSPITAL_BASED_OUTPATIENT_CLINIC_OR_DEPARTMENT_OTHER): Payer: Medicare Other | Admitting: Cardiovascular Disease

## 2022-03-02 VITALS — BP 110/72 | HR 85 | Ht 66.0 in | Wt 277.7 lb

## 2022-03-02 DIAGNOSIS — I10 Essential (primary) hypertension: Secondary | ICD-10-CM | POA: Diagnosis not present

## 2022-03-02 DIAGNOSIS — G4733 Obstructive sleep apnea (adult) (pediatric): Secondary | ICD-10-CM | POA: Diagnosis not present

## 2022-03-02 DIAGNOSIS — Z9989 Dependence on other enabling machines and devices: Secondary | ICD-10-CM

## 2022-03-02 NOTE — Patient Instructions (Signed)
Medication Instructions:  Continue current medications  *If you need a refill on your cardiac medications before your next appointment, please call your pharmacy*   Lab Work: None Ordered   Testing/Procedures: None Ordered   Follow-Up: At [facility], you and your health needs are our priority.  As part of our continuing mission to provide you with exceptional heart care, we have created designated Provider Care Teams.  These Care Teams include your primary Cardiologist (physician) and Advanced Practice Providers (APPs -  Physician Assistants and Nurse Practitioners) who all work together to provide you with the care you need, when you need it.  We recommend signing up for the patient portal called "MyChart".  Sign up information is provided on this After Visit Summary.  MyChart is used to connect with patients for Virtual Visits (Telemedicine).  Patients are able to view lab/test results, encounter notes, upcoming appointments, etc.  Non-urgent messages can be sent to your provider as well.   To learn more about what you can do with MyChart, go to [email or web address].    Your next appointment:   1 year(s)  The format for your next appointment:   In Person  Provider:   Skeet Latch, MD    Other Instructions

## 2022-03-02 NOTE — Assessment & Plan Note (Signed)
She was congratulated on her weight loss thus far.  Work on increasing exercise to at least 150 minutes weekly.  Referred to PREP.  Keep up the Weight Watchers.

## 2022-03-02 NOTE — Assessment & Plan Note (Signed)
Blood pressure is well-controlled.  Continue amlodipine, telmisartan and HCTZ.  She was encouraged to increase her exercise.  Refer to [employer] program.

## 2022-03-02 NOTE — Progress Notes (Signed)
Cardiology Office Note  Date:  03/02/2022   ID:  Sybel, Standish [birth year], MRN [id number]  PCP:  Harlan Stains, MD  Cardiologist:   Skeet Latch, MD   No chief complaint on file.   History of Present Illness: Michelle Huerta is a 61 y.o. female with hypertension, morbid obesity, Lupus, and Paget's disease who presents for follow up.  She was initially seen 06/2015 for evaluation prior to starting Belviq.  At that appointment she reported symptoms concerning for OSA and was referred for a sleep study.  She was found to have OSA and started on a CPAP.  She was referred for an echo 06/2015 that revealed LVEF 55-60% with grade 1 diastolic dysfunction. She had a nuclear stress test 12/2019 that was negative for ischemia.  Michelle Huerta stopped working due to chronic pain.  She is on disability.  She struggles with chronic knee pain.  She has difficulty using her CPAP due to sinus drainage. She called our office 07/2021 reporting rapid heart rates but has not been seen recently. She has been seen by GI and has been diagnosed with H. pylori.  Today, she continues to work on weight loss in order to have her right knee replacement. She has successfully lost weight. Last year in clinic she was 293 lbs; today she is 277 lbs. However, she has not been as active as she would like. Her exercise has been significantly limited by back pain, right knee pain, and a hernia. From April to May she thought she was having chest pains, but she reports that her work up at that time was reassuring. She denies any recent concerns for chest pain. She is not monitoring her blood pressure at home. In clinic today her blood pressure is 110/72. Usually she takes her antihypertensives between 8-10 AM, and her second dose between 2-3 PM. Also she is compliant with her treatment for H. pylori. In October she will be retested. She denies any palpitations, shortness of breath, or peripheral edema. No lightheadedness, headaches,  syncope, orthopnea, or PND.   Past Medical History:  Diagnosis Date   Allergic urticaria 07/02/2019   Allergy    Arthritis of both knees    Asthma    Bilateral chronic knee pain    Chronic back pain    Constipation    DDD (degenerative disc disease), cervical    Genital herpes    HSV type 2 at rectum, culture positive 3/15   GERD (gastroesophageal reflux disease)    Glaucoma    Grade I diastolic dysfunction 84/69/6295   .noted on ECHO. , Murmur   History of colon polyps    HLD (hyperlipidemia)    Hypertension    Insomnia    Internal hemorrhoids    Lactose intolerance    Lupus ([place]) 2008   LVH (left ventricular hypertrophy) 06/25/2015   Mild, noted on ECHO    Mild sleep apnea    cpap   Morbid obesity ([place]) 06/11/2015   Osteoporosis    Plantar fasciitis, bilateral    Pre-diabetes    Pt takes metformin   Thrombocytopenia (HCC)    TR (tricuspid regurgitation) 06/25/2015   Trace, noted on ECHO    Tubular adenoma polyp of rectum    8/12, Dr Fuller Plan, 5 yr follow up    Uterine fibroid 10/19/2017   noted on CT pelvis   Vitamin D deficiency     Past Surgical History:  Procedure Laterality Date   AUGMENTATION MAMMAPLASTY Bilateral 2001  BREAST REDUCTION SURGERY Bilateral 1999   COLONOSCOPY  01/19/2016   DILATION AND CURETTAGE OF UTERUS N/A 06/24/2015   Procedure: DILATATION AND CURETTAGE;  Surgeon: Emily Filbert, MD;  Location: [facility];  Service: Gynecology;  Laterality: N/A;   KNEE ARTHROSCOPY WITH SUBCHONDROPLASTY Right 05/03/2018   Procedure: RIGHT KNEE ARTHROSCOPY WITH DEBRIDEMENT, PARTIAL MEDIAL MENISCECTOMY SUBCHONDROPLASTY MEDIAL TIBIAL PLATEAU AND MEDIAL FEMORAL CONDYLE;  Surgeon: Mcarthur Rossetti, MD;  Location: [facility];  Service: Orthopedics;  Laterality: Right;   POLYPECTOMY     REDUCTION MAMMAPLASTY     TOE SURGERY     removal of bone in each foot   TUBAL LIGATION  1991     Current Outpatient Medications  Medication Sig Dispense Refill    acetaminophen (TYLENOL) 500 MG tablet Take 1,000 mg by mouth every 6 (six) hours as needed for moderate pain.     acyclovir (ZOVIRAX) 400 MG tablet Take 400 mg by mouth 2 (two) times daily.     albuterol (VENTOLIN HFA) 108 (90 Base) MCG/ACT inhaler INHALE 2 PUFFS INTO THE LUNGS EVERY 6 HOURS AS NEEDED FOR WHEEZING OR SHORTNESS OF BREATH. 18 g 0   ARTIFICIAL TEAR SOLUTION OP Place 1 drop into both eyes daily.     cetirizine (ZYRTEC) 10 MG tablet Take 10 mg by mouth daily.     cyclobenzaprine (FLEXERIL) 10 MG tablet Take 10 mg by mouth at bedtime as needed for muscle spasms.     docusate sodium (COLACE) 100 MG capsule Take 100 mg by mouth daily.     famotidine (PEPCID) 20 MG tablet Take 20 mg by mouth at bedtime.     fluticasone (FLONASE) 50 MCG/ACT nasal spray Place 2 sprays into both nostrils daily. (Patient taking differently: Place 2 sprays into both nostrils daily as needed for allergies.) 16 g 6   ibuprofen (ADVIL) 800 MG tablet Take 800 mg by mouth every 8 (eight) hours as needed for mild pain or moderate pain.     Melatonin 10 MG CAPS Take 20 mg by mouth at bedtime.     Multiple Vitamins-Minerals (ADULT GUMMY PO) Take 1 capsule by mouth daily. alive     oxybutynin (DITROPAN-XL) 10 MG 24 hr tablet Take 10 mg by mouth daily.     Probiotic Product (PROBIOTIC PO) Take 1 capsule by mouth daily.     telmisartan-hydrochlorothiazide (MICARDIS HCT) 80-12.5 MG tablet Take 1 tablet by mouth daily.     traMADol (ULTRAM) 50 MG tablet Take 1 tablet (50 mg total) by mouth every 6 (six) hours as needed. 20 tablet 0   Vitamin D, Ergocalciferol, (DRISDOL) 1.25 MG (50000 UNIT) CAPS capsule Take 1 capsule (50,000 Units total) by mouth every 7 (seven) days. 4 capsule 0   zolpidem (AMBIEN) 10 MG tablet Take 10 mg by mouth every other day. At bedtime     amLODipine (NORVASC) 10 MG tablet TAKE 1 TABLET BY MOUTH ONCE A DAY 90 tablet 1   metFORMIN (GLUCOPHAGE) 500 MG tablet TAKE 1 TABLET (500 MG TOTAL) BY MOUTH 2  (TWO) TIMES DAILY WITH A MEAL. (Patient taking differently: Take 500 mg by mouth daily.) 60 tablet 0   omeprazole (PRILOSEC) 40 MG capsule Take 1 capsule (40 mg total) by mouth 2 (two) times daily. 60 capsule 2   No current facility-administered medications for this visit.    Allergies:   Naltrexone-bupropion hcl er, Penicillins, and Reclast [zoledronic acid]    Social History:  The patient  reports that she quit smoking  about 13 years ago. Her smoking use included cigars. She has never used smokeless tobacco. She reports current alcohol use of about 5.0 standard drinks of alcohol per week. She reports that she does not use drugs.   Family History:  The patient's family history includes Allergic rhinitis in her mother; Cancer in her maternal grandmother; Heart disease in her mother; Hypertension in her father, mother, sister, son, and son; Stomach cancer in her paternal grandmother.    ROS:   Please see the history of present illness. (+) Right knee pain (+) Back pain All other systems are reviewed and negative.    PHYSICAL EXAM:  VS:  BP 110/72 (BP Location: Left Arm, Patient Position: Sitting, Cuff Size: Large)   Pulse 85   Ht '5\' 6"'$  (1.676 m)   Wt 277 lb 11.2 oz (126 kg)   LMP 08/01/2012   BMI 44.82 kg/m  , BMI Body mass index is 44.82 kg/m. GENERAL:  Well appearing.  No acute distress.  HEENT:  Pupils equal round and reactive, fundi not visualized, oral mucosa unremarkable NECK:  No jugular venous distention, waveform within normal limits, carotid upstroke brisk and symmetric, no bruits LUNGS:  Clear to auscultation bilaterally.  No crackles, wheezes or rhonci HEART:  RRR.  PMI not displaced or sustained,S1 and S2 within normal limits, no S3, no S4, no clicks, no rubs, no murmurs ABD:  Flat, positive bowel sounds normal in frequency in pitch, no bruits, no rebound, no guarding, no midline pulsatile mass, no hepatomegaly, no splenomegaly.  Obese EXT:  2 plus pulses throughout,  no edema, no cyanosis, no clubbing SKIN:  No rashes no nodules NEURO:  Cranial nerves II through XII grossly intact, motor grossly intact throughout PSYCH:  Cognitively intact, oriented to person place and time   EKG:  EKG is personally reviewed. 03/02/2022:  Sinus rhythm. Rate 85 bpm. PVC. Incomplete RBBB. 11/19/2020: Sinus rhythm. Rate 88 bpm. 11/19/19: Sinus rhythm.  Rate 78 bpm 01/16/2017: Sinus rhythm. Rate 75 bpm. 06/11/2015: Sinus rhythm. Rate 83 bpm. Early R wave progression.   Echo 01/31/2020: 1. Left ventricular ejection fraction, by estimation, is 60 to 65%. The  left ventricle has normal function. The left ventricle has no regional  wall motion abnormalities. Left ventricular diastolic parameters are  consistent with Grade I diastolic  dysfunction (impaired relaxation).   2. Right ventricular systolic function is normal. The right ventricular  size is normal. Tricuspid regurgitation signal is inadequate for assessing  PA pressure.   3. The mitral valve is normal in structure. No evidence of mitral valve  regurgitation. No evidence of mitral stenosis.   4. The aortic valve is normal in structure. Aortic valve regurgitation is  not visualized. No aortic stenosis is present.   Myoview Lexiscan 01/07/2020: The left ventricular ejection fraction is mildly decreased (45-54%). Nuclear stress EF: 49%. There was no ST segment deviation noted during stress. This is an intermediate risk study. Inferior hypokinesis noted. Consider echo to better assess systolic function. There is no evidence of ischemia.  Echo 06/25/15: Study Conclusions   - Left ventricle: The cavity size was normal. Wall thickness was   increased in a pattern of mild LVH. Systolic function was normal.   The estimated ejection fraction was in the range of 55% to 60%.   Regional wall motion abnormalities cannot be excluded. Doppler   parameters are consistent with abnormal left ventricular   relaxation (grade 1  diastolic dysfunction).   Impressions:   - Technically difficult; normal  LV systolic function; grade 1   diastolic dysfunction; mild LVH; trace TR.  Recent Labs: 10/29/2021: ALT 33; BUN 17; Creatinine, Ser 0.78; Hemoglobin 11.7; Platelets 131.0; Potassium 4.0; Sodium 137    Lipid Panel    Component Value Date/Time   CHOL 151 12/10/2019 1046   TRIG 98 12/10/2019 1046   HDL 66 12/10/2019 1046   CHOLHDL 1.8 05/30/2018 1116   LDLCALC 67 12/10/2019 1046   03/25/15: WBC 5.5, hematocrit 36.7, platelets 138 Sodium 141, potassium 3.7, BUN 15, creatinine 0.79 AST 17 ALT 18 TSH 1.33 Total cholesterol 150, triglycerides 95, HDL 70, LDL61  Wt Readings from Last 3 Encounters:  03/02/22 277 lb 11.2 oz (126 kg)  10/29/21 282 lb 6.4 oz (128.1 kg)  07/01/21 280 lb (127 kg)      ASSESSMENT AND PLAN:  Benign essential HTN Blood pressure is well-controlled.  Continue amlodipine, telmisartan and HCTZ.  She was encouraged to increase her exercise.  Refer to PREP program.   OSA on CPAP Continue CPAP.  Keep working on weight loss.    Severe obesity (BMI >= 40) (HCC) She was congratulated on her weight loss thus far.  Work on increasing exercise to at least 150 minutes weekly.  Referred to PREP.  Keep up the Weight Watchers.   Disposition: FU with Schwanda Zima C. Oval Linsey, MD, [facility] in 1 year.  Medication Adjustments/Labs and Tests Ordered: Current medicines are reviewed at length with the patient today.  Concerns regarding medicines are outlined above.   Orders Placed This Encounter  Procedures   Amb Referral To Provider Referral Exercise Program (P.R.E.P)   EKG 12-Lead   No orders of the defined types were placed in this encounter.  Patient Instructions  Medication Instructions:  Continue current medications  *If you need a refill on your cardiac medications before your next appointment, please call your pharmacy*   Lab Work: None Ordered   Testing/Procedures: None  Ordered   Follow-Up: At [facility], you and your health needs are our priority.  As part of our continuing mission to provide you with exceptional heart care, we have created designated Provider Care Teams.  These Care Teams include your primary Cardiologist (physician) and Advanced Practice Providers (APPs -  Physician Assistants and Nurse Practitioners) who all work together to provide you with the care you need, when you need it.  We recommend signing up for the patient portal called "MyChart".  Sign up information is provided on this After Visit Summary.  MyChart is used to connect with patients for Virtual Visits (Telemedicine).  Patients are able to view lab/test results, encounter notes, upcoming appointments, etc.  Non-urgent messages can be sent to your provider as well.   To learn more about what you can do with MyChart, go to [email or web address].    Your next appointment:   1 year(s)  The format for your next appointment:   In Person  Provider:   Skeet Latch, MD    Other Instructions           I,Mathew Stumpf,acting as a scribe for Skeet Latch, MD.,have documented all relevant documentation on the behalf of Skeet Latch, MD,as directed by  Skeet Latch, MD while in the presence of Skeet Latch, MD.  I, [place] Oval Linsey, MD have reviewed all documentation for this visit.  The documentation of the exam, diagnosis, procedures, and orders on 03/02/2022 are all accurate and complete.  Signed, Aarianna Hoadley C. Oval Linsey, MD, [facility]  03/02/2022 2:33 PM    [place]  [facility] 

## 2022-03-02 NOTE — Assessment & Plan Note (Signed)
Continue CPAP.  Keep working on weight loss.  

## 2022-03-03 NOTE — Progress Notes (Signed)
Received a new referral for PREP.  Patient had to stop last attempt due to knee injury  Reached out via text to inquire if she was ready to try again.  She is interested. Next class M/W 230p-345p will start Oct 30th at [facility] contact her closer to start of class to complete intake Pt agreeable to plan

## 2022-03-28 ENCOUNTER — Encounter (INDEPENDENT_AMBULATORY_CARE_PROVIDER_SITE_OTHER): Payer: Self-pay | Admitting: Internal Medicine

## 2022-03-28 ENCOUNTER — Ambulatory Visit (INDEPENDENT_AMBULATORY_CARE_PROVIDER_SITE_OTHER): Payer: Medicare Other | Admitting: Internal Medicine

## 2022-03-28 VITALS — BP 142/83 | HR 75 | Temp 98.1°F | Ht 66.0 in | Wt 270.0 lb

## 2022-03-28 DIAGNOSIS — Z6841 Body Mass Index (BMI) 40.0 and over, adult: Secondary | ICD-10-CM

## 2022-03-28 DIAGNOSIS — Z0289 Encounter for other administrative examinations: Secondary | ICD-10-CM

## 2022-03-28 DIAGNOSIS — G4733 Obstructive sleep apnea (adult) (pediatric): Secondary | ICD-10-CM

## 2022-03-28 DIAGNOSIS — E559 Vitamin D deficiency, unspecified: Secondary | ICD-10-CM

## 2022-03-28 DIAGNOSIS — G8929 Other chronic pain: Secondary | ICD-10-CM

## 2022-03-28 DIAGNOSIS — R5383 Other fatigue: Secondary | ICD-10-CM

## 2022-03-28 DIAGNOSIS — R7303 Prediabetes: Secondary | ICD-10-CM

## 2022-03-28 DIAGNOSIS — I10 Essential (primary) hypertension: Secondary | ICD-10-CM

## 2022-03-28 NOTE — Progress Notes (Unsigned)
  Office: [phone]  /  Fax: [phone]  New Patient Consultation  Michelle Huerta was seen in clinic today to evaluate for obesity. We did a consultation to discuss her options for treatment and educate the patient on her disease state. She was a former patient is wants to reestablish care. She is in need of non-emergent knee and hernia surgery and was advised she lose weight to reduce risks of complications. Her peak weight was 303 lbs and is down to 270 due to dietary changes. Has been consuming seafood and vegetables several times a week.   Michelle Huerta's evaluation and workup began today. She was weighed on the bioimpedance scale and results were discussed and documented in the synopsis. Blood pressure (!) 142/83, pulse 75, temperature 98.1 F (36.7 C), height '5\' 6"'$  (1.676 m), weight 270 lb (122.5 kg), last menstrual period 08/01/2012, SpO2 99 %. Body mass index is 43.58 kg/m.   Obesity education preformed today:  We discussed obesity as a disease and the importance of a more detailed evaluation of all the factors contributing to the disease.  We discussed the importance of long term lifestyle changes which include nutrition, exercise and behavioral modifications as well as the importance of customizing this to her specific health and social needs.  We discussed the benefits of reaching a healthier weight to alleviate the symptoms or reduce the risks of biomechanical, metabolic and psychological effects of obesity.  Current health conditions we discussed, and we expect to improve include:   Class 3 severe obesity with serious comorbidity and body mass index (BMI) of 40.0 to 44.9 in adult, unspecified obesity type ([place])  Prediabetes  Vitamin D deficiency  Essential hypertension  Chronic pain of right knee  OSA on CPAP.  We discussed the goals of this program is to improve her overall health and not simply achieve a specific BMI.  Frequent visits are very important to  patient success. I plan to see her every 2 weeks for the first 3 months and then evaluate the visit frequency after that time. I explained obesity is a life-long chronic disease and long term treatments would be required. Medications to help her follow his eating plan may be offered as appropriate but are not required. All medication decisions will be made together after the initial workup is done and benefits and side effects are discussed in depth.  The clinic rules were reviewed including the late policy, cancellation policy, no show and program fees.  Michelle Huerta appears to be in the action stage of change and agrees they are ready to start intensive lifestyle modifications and behavioral modifications. We will schedule a longer visit to continue the evaluation including fasting labs, indirect calorimetry, ECG and a full nutritional and lifestyle evaluation.  25 minutes was spent today on this visit including the above counseling, pre-visit chart review, and post-visit documentation.

## 2022-04-04 ENCOUNTER — Ambulatory Visit (INDEPENDENT_AMBULATORY_CARE_PROVIDER_SITE_OTHER): Payer: Medicare Other | Admitting: Internal Medicine

## 2022-04-06 ENCOUNTER — Encounter (INDEPENDENT_AMBULATORY_CARE_PROVIDER_SITE_OTHER): Payer: Self-pay | Admitting: Internal Medicine

## 2022-04-06 ENCOUNTER — Ambulatory Visit (INDEPENDENT_AMBULATORY_CARE_PROVIDER_SITE_OTHER): Payer: Medicare Other | Admitting: Internal Medicine

## 2022-04-06 VITALS — BP 112/75 | HR 70 | Temp 97.4°F | Ht 66.0 in | Wt 268.0 lb

## 2022-04-06 DIAGNOSIS — R0602 Shortness of breath: Secondary | ICD-10-CM | POA: Diagnosis not present

## 2022-04-06 DIAGNOSIS — Z6841 Body Mass Index (BMI) 40.0 and over, adult: Secondary | ICD-10-CM

## 2022-04-06 DIAGNOSIS — Z723 Lack of physical exercise: Secondary | ICD-10-CM | POA: Diagnosis not present

## 2022-04-06 DIAGNOSIS — R7303 Prediabetes: Secondary | ICD-10-CM | POA: Diagnosis not present

## 2022-04-06 DIAGNOSIS — E66813 Obesity, class 3: Secondary | ICD-10-CM

## 2022-04-06 DIAGNOSIS — I10 Essential (primary) hypertension: Secondary | ICD-10-CM | POA: Diagnosis not present

## 2022-04-06 DIAGNOSIS — Z1331 Encounter for screening for depression: Secondary | ICD-10-CM

## 2022-04-06 DIAGNOSIS — R5383 Other fatigue: Secondary | ICD-10-CM

## 2022-04-07 ENCOUNTER — Telehealth (INDEPENDENT_AMBULATORY_CARE_PROVIDER_SITE_OTHER): Payer: Self-pay | Admitting: Internal Medicine

## 2022-04-07 NOTE — Telephone Encounter (Signed)
Michelle Huerta called has questions about her meal plan that Dr Gerarda Fraction put her own.

## 2022-04-08 ENCOUNTER — Other Ambulatory Visit: Payer: Medicare Other

## 2022-04-08 LAB — COMPREHENSIVE METABOLIC PANEL
ALT: 30 IU/L (ref 0–32)
AST: 24 IU/L (ref 0–40)
Albumin/Globulin Ratio: 1 — ABNORMAL LOW (ref 1.2–2.2)
Albumin: 4.1 g/dL (ref 3.8–4.9)
Alkaline Phosphatase: 105 IU/L (ref 44–121)
BUN/Creatinine Ratio: 22 (ref 9–23)
BUN: 16 mg/dL (ref 6–24)
Bilirubin Total: 0.6 mg/dL (ref 0.0–1.2)
CO2: 23 mmol/L (ref 20–29)
Calcium: 9.5 mg/dL (ref 8.7–10.2)
Chloride: 102 mmol/L (ref 96–106)
Creatinine, Ser: 0.74 mg/dL (ref 0.57–1.00)
Globulin, Total: 4.1 g/dL (ref 1.5–4.5)
Glucose: 84 mg/dL (ref 70–99)
Potassium: 4.2 mmol/L (ref 3.5–5.2)
Sodium: 138 mmol/L (ref 134–144)
Total Protein: 8.2 g/dL (ref 6.0–8.5)
eGFR: 93 mL/min/{1.73_m2} (ref >59)

## 2022-04-08 LAB — LIPID PANEL WITH LDL/HDL RATIO
Cholesterol, Total: 141 mg/dL (ref 100–199)
HDL: 78 mg/dL (ref >39)
LDL Chol Calc (NIH): 49 mg/dL (ref 0–99)
LDL/HDL Ratio: 0.6 ratio (ref 0.0–3.2)
Triglycerides: 70 mg/dL (ref 0–149)
VLDL Cholesterol Cal: 14 mg/dL (ref 5–40)

## 2022-04-08 LAB — CBC WITH DIFFERENTIAL/PLATELET
Basophils Absolute: 0 10*3/uL (ref 0.0–0.2)
Basos: 0 %
EOS (ABSOLUTE): 0.1 10*3/uL (ref 0.0–0.4)
Eos: 1 %
Hematocrit: 37.2 % (ref 34.0–46.6)
Hemoglobin: 12.4 g/dL (ref 11.1–15.9)
Immature Grans (Abs): 0 10*3/uL (ref 0.0–0.1)
Immature Granulocytes: 0 %
Lymphocytes Absolute: 2.4 10*3/uL (ref 0.7–3.1)
Lymphs: 35 %
MCH: 27.7 pg (ref 26.6–33.0)
MCHC: 33.3 g/dL (ref 31.5–35.7)
MCV: 83 fL (ref 79–97)
Monocytes Absolute: 0.4 10*3/uL (ref 0.1–0.9)
Monocytes: 6 %
Neutrophils Absolute: 3.8 10*3/uL (ref 1.4–7.0)
Neutrophils: 58 %
Platelets: 182 10*3/uL (ref 150–450)
RBC: 4.48 x10E6/uL (ref 3.77–5.28)
RDW: 14.1 % (ref 11.7–15.4)
WBC: 6.7 10*3/uL (ref 3.4–10.8)

## 2022-04-08 LAB — T4, FREE: Free T4: 1.3 ng/dL (ref 0.82–1.77)

## 2022-04-08 LAB — HEMOGLOBIN A1C
Est. average glucose Bld gHb Est-mCnc: 120 mg/dL
Hgb A1c MFr Bld: 5.8 % — ABNORMAL HIGH (ref 4.8–5.6)

## 2022-04-08 LAB — VITAMIN B12: Vitamin B-12: 1325 pg/mL — ABNORMAL HIGH (ref 232–1245)

## 2022-04-08 LAB — TSH: TSH: 1.3 u[IU]/mL (ref 0.450–4.500)

## 2022-04-08 LAB — INSULIN, RANDOM: INSULIN: 20 u[IU]/mL (ref 2.6–24.9)

## 2022-04-08 LAB — VITAMIN D 25 HYDROXY (VIT D DEFICIENCY, FRACTURES): Vit D, 25-Hydroxy: 58.7 ng/mL (ref 30.0–100.0)

## 2022-04-11 ENCOUNTER — Other Ambulatory Visit: Payer: Medicare Other

## 2022-04-11 DIAGNOSIS — A048 Other specified bacterial intestinal infections: Secondary | ICD-10-CM

## 2022-04-12 NOTE — Progress Notes (Unsigned)
Chief Complaint:   OBESITY Michelle Huerta (MR# [id number]) is a 61 y.o. female who presents for evaluation and treatment of obesity and related comorbidities. Current BMI is Body mass index is 43.26 kg/m. Michelle Huerta has been struggling with her weight for many years and has been unsuccessful in either losing weight, maintaining weight loss, or reaching her healthy weight goal.  Michelle Huerta is a former patient and she is reestablishing care.  She is doing weight watchers with associated weight loss.  I see decreased in comparison to 2021.  She is eating protein, fruit, and vegetables.  She reports adequate appetite craving control.  No physical activity, and has shortness of breath with exertion.  No other cardio pulmonary symptoms. Her goal weight is 240 pounds, and she needs hernia surgery.  [name] is currently in the action stage of change and ready to dedicate time achieving and maintaining a healthier weight. Michelle Huerta is interested in becoming our patient and working on intensive lifestyle modifications including (but not limited to) diet and exercise for weight loss.  Michelle Huerta's habits were reviewed today and are as follows: her desired weight loss is 28 lbs, she has been heavy most of her life, she started gaining weight with her pregnancies, her heaviest weight ever was 304 pounds, she is a picky eater and doesn't like to eat healthier foods, she has significant food cravings issues, she snacks frequently in the evenings, she is frequently drinking liquids with calories, she frequently makes poor food choices, and she struggles with emotional eating.  Depression Screen Michelle Huerta's Food and Mood (modified PHQ-9) score was 12.     04/06/2022    2:54 PM  Depression screen PHQ 2/9  Decreased Interest 1  Down, Depressed, Hopeless 1  PHQ - 2 Score 2  Altered sleeping 3  Tired, decreased energy 3  Change in appetite 2  Feeling bad or failure about yourself  0  Trouble concentrating 1  Moving slowly or  fidgety/restless 1  Suicidal thoughts 0  PHQ-9 Score 12  Difficult doing work/chores Somewhat difficult   Subjective:   1. Other fatigue [name] admits to daytime somnolence and admits to waking up still tired. Patient has a history of symptoms of daytime fatigue and morning fatigue. Michelle Huerta generally gets 2 or 3 hours of sleep per night, and states that she has nightime awakenings. Snoring is not present. Apneic episodes are present. Epworth Sleepiness Score is 3.   2. SOBOE (shortness of breath on exertion) Michelle Huerta notes increasing shortness of breath with exercising and seems to be worsening over time with weight gain. She notes getting out of breath sooner with activity than she used to. This has not gotten worse recently. [name] denies shortness of breath at rest or orthopnea.  3. Prediabetes Michelle Huerta's last A1c was 5.9 in November 2022.  She is asymptomatic, and has been counseled on risk of progression.  She is on metformin.  I discussed labs with the patient today.  4. Essential hypertension Michelle Huerta's blood pressure is at goal.  She is on CCB, ARB, hydrochlorothiazide.  No side effects were noted.  5. Inactivity, physical Michelle Huerta was counseled on the benefits of NEAT and exercise.  Assessment/Plan:   1. Other fatigue [name] does feel that her weight is causing her energy to be lower than it should be. Fatigue may be related to obesity, depression or many other causes. Labs will be ordered, and in the meanwhile, Michelle Huerta will focus on self care including making healthy food choices, increasing  physical activity and focusing on stress reduction.  - Vitamin B12 - CBC with Differential/Platelet - Comprehensive metabolic panel - Lipid Panel With LDL/HDL Ratio - VITAMIN D 25 Hydroxy (Vit-D Deficiency, Fractures) - TSH - T4, free  2. SOBOE (shortness of breath on exertion) [name] does feel that she gets out of breath more easily that she used to when she exercises. Michelle Huerta's shortness of breath appears to be  obesity related and exercise induced. She has agreed to work on weight loss and gradually increase exercise to treat her exercise induced shortness of breath. Will continue to monitor closely.  3. Prediabetes We will check labs today.  Michelle Huerta will work on her weight loss therapy and increase physical activity.  She will continue metformin.   - Hemoglobin A1c - Insulin, random  4. Essential hypertension We will check labs today. We will follow-up at Michelle Huerta's next visit.  5. Inactivity, physical Michelle Huerta will start [employer], and we will continue additional counseling at her next visit.  6. Depression screening Michelle Huerta had a positive depression screening. Depression is commonly associated with obesity and often results in emotional eating behaviors. We will monitor this closely and work on CBT to help improve the non-hunger eating patterns. Referral to Psychology may be required if no improvement is seen as she continues in our clinic.  7. Class 3 severe obesity with serious comorbidity and body mass index (BMI) of 40.0 to 44.9 in adult, unspecified obesity type (HCC) Michelle Huerta is currently in the action stage of change and her goal is to continue with weight loss efforts. I recommend Michelle Huerta begin the structured treatment plan as follows:  She has agreed to the Category 2 Plan.  Reduce calorie, low carbohydrate, high-protein meal plan.  Increase NEAT and exercise.  Exercise goals: Start [employer] in 1 month.  Behavioral modification strategies: increasing lean protein intake, decreasing simple carbohydrates, increasing water intake, no skipping meals, better snacking choices, and planning for success.  She was informed of the importance of frequent follow-up visits to maximize her success with intensive lifestyle modifications for her multiple health conditions. She was informed we would discuss her lab results at her next visit unless there is a critical issue that needs to be addressed sooner. Michelle Huerta agreed to keep  her next visit at the agreed upon time to discuss these results.  Objective:   Blood pressure 112/75, pulse 70, temperature (!) 97.4 F (36.3 C), height '5\' 6"'$  (1.676 m), weight 268 lb (121.6 kg), last menstrual period 08/01/2012, SpO2 100 %. Body mass index is 43.26 kg/m.  EKG: Normal sinus rhythm, rate 85 BPM.  Indirect Calorimeter completed today shows a VO2 of 254 and a REE of 1757.  Her calculated basal metabolic rate is 1610 thus her basal metabolic rate is worse than expected.  General: Cooperative, alert, well developed, in no acute distress. HEENT: Conjunctivae and lids unremarkable. Cardiovascular: Regular rhythm.  Lungs: Normal work of breathing. Neurologic: No focal deficits.   Lab Results  Component Value Date   CREATININE 0.74 04/06/2022   BUN 16 04/06/2022   NA 138 04/06/2022   K 4.2 04/06/2022   CL 102 04/06/2022   CO2 23 04/06/2022   Lab Results  Component Value Date   ALT 30 04/06/2022   AST 24 04/06/2022   ALKPHOS 105 04/06/2022   BILITOT 0.6 04/06/2022   Lab Results  Component Value Date   HGBA1C 5.8 (H) 04/06/2022   HGBA1C 5.9 (H) 05/20/2021   HGBA1C 5.8 (H) 05/19/2020   HGBA1C  5.8 (H) 12/10/2019   HGBA1C 5.4 06/24/2019   Lab Results  Component Value Date   INSULIN 20.0 04/06/2022   INSULIN 32.9 (H) 05/19/2020   INSULIN 44.4 (H) 12/10/2019   INSULIN 36.3 (H) 06/24/2019   INSULIN 37.3 (H) 02/07/2019   Lab Results  Component Value Date   TSH 1.300 04/06/2022   Lab Results  Component Value Date   CHOL 141 04/06/2022   HDL 78 04/06/2022   LDLCALC 49 04/06/2022   TRIG 70 04/06/2022   CHOLHDL 1.8 05/30/2018   Lab Results  Component Value Date   WBC 6.7 04/06/2022   HGB 12.4 04/06/2022   HCT 37.2 04/06/2022   MCV 83 04/06/2022   PLT 182 04/06/2022   Lab Results  Component Value Date   IRON 71 11/04/2021   TIBC 260 11/04/2021   FERRITIN 115.6 11/04/2021   Attestation Statements:   Reviewed by clinician on day of visit:  allergies, medications, problem list, medical history, surgical history, family history, social history, and previous encounter notes.  Time spent on visit including pre-visit chart review and post-visit charting and care was 40 minutes.   I, Trixie Dredge, am acting as transcriptionist for Thomes Dinning, MD.  I have reviewed the above documentation for accuracy and completeness, and I agree with the above. Thomes Dinning, MD

## 2022-04-13 LAB — H. PYLORI ANTIGEN, STOOL: H pylori Ag, Stl: NEGATIVE

## 2022-04-18 ENCOUNTER — Ambulatory Visit (INDEPENDENT_AMBULATORY_CARE_PROVIDER_SITE_OTHER): Payer: Medicare Other | Admitting: Internal Medicine

## 2022-04-20 ENCOUNTER — Ambulatory Visit (INDEPENDENT_AMBULATORY_CARE_PROVIDER_SITE_OTHER): Payer: Medicare Other | Admitting: Internal Medicine

## 2022-04-20 ENCOUNTER — Encounter (INDEPENDENT_AMBULATORY_CARE_PROVIDER_SITE_OTHER): Payer: Self-pay | Admitting: Internal Medicine

## 2022-04-20 VITALS — BP 138/85 | HR 76 | Temp 97.9°F | Ht 66.0 in | Wt 269.0 lb

## 2022-04-20 DIAGNOSIS — E559 Vitamin D deficiency, unspecified: Secondary | ICD-10-CM

## 2022-04-20 DIAGNOSIS — I10 Essential (primary) hypertension: Secondary | ICD-10-CM | POA: Diagnosis not present

## 2022-04-20 DIAGNOSIS — R638 Other symptoms and signs concerning food and fluid intake: Secondary | ICD-10-CM | POA: Diagnosis not present

## 2022-04-20 DIAGNOSIS — Z6841 Body Mass Index (BMI) 40.0 and over, adult: Secondary | ICD-10-CM | POA: Insufficient documentation

## 2022-04-20 DIAGNOSIS — R7303 Prediabetes: Secondary | ICD-10-CM

## 2022-04-20 DIAGNOSIS — E66813 Obesity, class 3: Secondary | ICD-10-CM

## 2022-04-20 DIAGNOSIS — K5909 Other constipation: Secondary | ICD-10-CM

## 2022-04-20 DIAGNOSIS — E669 Obesity, unspecified: Secondary | ICD-10-CM

## 2022-04-26 NOTE — Progress Notes (Signed)
Chief Complaint:   OBESITY Michelle Huerta is here to discuss her progress with her obesity treatment plan along with follow-up of her obesity related diagnoses. Michelle Huerta is on the Category 2 Plan and states she is following her eating plan approximately 30% of the time. Michelle Huerta states she is doing 0 minutes 0 times per week.  Today's visit was #: 2 Starting weight: 268 lbs Starting date: 04/06/2022 Today's weight: 269 lbs Today's date: 04/20/2022 Total lbs lost to date: 0 Total lbs lost since last in-office visit: 0  Interim History: Michelle Huerta reports some difficulty understanding her meal plan, portions, and structure. She has decreased eating out but she acknowledges cravings and eating sweets (pound cake and Oreos). She had also eaten liver and  fried chicken and she wasn't sure it was healthy or not.   Subjective:   1. Prediabetes Michelle Huerta's fasting blood glucose is 84, A1c is 5.8, and insulin 20. I discussed labs with the patient today.   2. Abnormal craving Michelle Huerta notes abnormal cravings due to insulin resistance, adiposity, and simple carbohydrates in her diet. I discussed labs with the patient today.   3. Vitamin D deficiency Michelle Huerta's vitamin D level is 58.7 on supplementation.  I discussed labs with the patient today.  4. Hypertension, essential Michelle Huerta's blood pressure has improved.  She is having constipation possibly compounded by CCB.  Labs reviewed.  Lipid profile favorable, and renal parameters within normal limits.  5. Other constipation Michelle Huerta had a colonoscopy in 2022.  She is on CCB.  Her diet is low in fiber with inadequate water intake.  No red flags.  Assessment/Plan:   1. Prediabetes Michelle Huerta will continue her reduced calorie meal plan with reduction/elimination of processed carbohydrates.  She will continue metformin and consider increasing dose if appetite/cravings increase.  2. Abnormal craving Michelle Huerta will continue her reduced calorie meal plan with reduction/elimination of processed  carbohydrates.  She will continue metformin and consider increasing dose if appetite/cravings increase.  3. Vitamin D deficiency Michelle Huerta will continue vitamin D, and consider switching to OTC to avoid excess supplementation.  4. Hypertension, essential Michelle Huerta will continue her blood pressure medications and weight loss therapy.  She is to increase her water intake and use MiraLAX 1 to 2 times a day.  5. Other constipation Michelle Huerta is to use MiraLAX twice a day, and increase her water intake 6-7 bottles a day. High complex carbohydrates nutritional guidance was provided.  6. Obesity with current BMI of 43.4 Michelle Huerta is currently in the action stage of change. As such, her goal is to continue with weight loss efforts. She has agreed to the Category 2 Plan.  She will benefit from additional nutritional counseling around macronutrients and healthier sources of protein.  Provided with breakfast and lunch alternative.   Continue reduced calorie meals plan, again reviewed in detail. Also reviewed healthier eating options and reading food labels.   Exercise goals: As is.   Behavioral modification strategies: no skipping meals, meal planning and cooking strategies, keeping healthy foods in the home, better snacking choices, avoiding temptations, and planning for success.  Michelle Huerta has agreed to follow-up with our clinic in 2 weeks. She was informed of the importance of frequent follow-up visits to maximize her success with intensive lifestyle modifications for her multiple health conditions.   Objective:   Blood pressure 138/85, pulse 76, temperature 97.9 F (36.6 C), height '5\' 6"'$  (1.676 m), weight 269 lb (122 kg), last menstrual period 08/01/2012, SpO2 98 %. Body mass index is  43.42 kg/m.  General: Cooperative, alert, well developed, in no acute distress. HEENT: Conjunctivae and lids unremarkable. Cardiovascular: Regular rhythm.  Lungs: Normal work of breathing. Neurologic: No focal deficits.   Lab  Results  Component Value Date   CREATININE 0.74 04/06/2022   BUN 16 04/06/2022   NA 138 04/06/2022   K 4.2 04/06/2022   CL 102 04/06/2022   CO2 23 04/06/2022   Lab Results  Component Value Date   ALT 30 04/06/2022   AST 24 04/06/2022   ALKPHOS 105 04/06/2022   BILITOT 0.6 04/06/2022   Lab Results  Component Value Date   HGBA1C 5.8 (H) 04/06/2022   HGBA1C 5.9 (H) 05/20/2021   HGBA1C 5.8 (H) 05/19/2020   HGBA1C 5.8 (H) 12/10/2019   HGBA1C 5.4 06/24/2019   Lab Results  Component Value Date   INSULIN 20.0 04/06/2022   INSULIN 32.9 (H) 05/19/2020   INSULIN 44.4 (H) 12/10/2019   INSULIN 36.3 (H) 06/24/2019   INSULIN 37.3 (H) 02/07/2019   Lab Results  Component Value Date   TSH 1.300 04/06/2022   Lab Results  Component Value Date   CHOL 141 04/06/2022   HDL 78 04/06/2022   LDLCALC 49 04/06/2022   TRIG 70 04/06/2022   CHOLHDL 1.8 05/30/2018   Lab Results  Component Value Date   VD25OH 58.7 04/06/2022   VD25OH 44.3 05/19/2020   VD25OH 41.3 12/10/2019   Lab Results  Component Value Date   WBC 6.7 04/06/2022   HGB 12.4 04/06/2022   HCT 37.2 04/06/2022   MCV 83 04/06/2022   PLT 182 04/06/2022   Lab Results  Component Value Date   IRON 71 11/04/2021   TIBC 260 11/04/2021   FERRITIN 115.6 11/04/2021   Attestation Statements:   Reviewed by clinician on day of visit: allergies, medications, problem list, medical history, surgical history, family history, social history, and previous encounter notes.  Time spent on visit including pre-visit chart review and post-visit care and charting was 40 minutes.   Wilhemena Durie, am acting as transcriptionist for Thomes Dinning, MD.  I have reviewed the above documentation for accuracy and completeness, and I agree with the above. -Thomes Dinning, MD

## 2022-05-04 ENCOUNTER — Encounter (INDEPENDENT_AMBULATORY_CARE_PROVIDER_SITE_OTHER): Payer: Self-pay | Admitting: Internal Medicine

## 2022-05-04 ENCOUNTER — Ambulatory Visit (INDEPENDENT_AMBULATORY_CARE_PROVIDER_SITE_OTHER): Payer: Medicare Other | Admitting: Internal Medicine

## 2022-05-04 VITALS — BP 128/83 | HR 83 | Temp 97.9°F | Ht 66.5 in | Wt 268.2 lb

## 2022-05-04 DIAGNOSIS — R7303 Prediabetes: Secondary | ICD-10-CM

## 2022-05-04 DIAGNOSIS — Z6841 Body Mass Index (BMI) 40.0 and over, adult: Secondary | ICD-10-CM

## 2022-05-04 DIAGNOSIS — E669 Obesity, unspecified: Secondary | ICD-10-CM | POA: Diagnosis not present

## 2022-05-04 DIAGNOSIS — I10 Essential (primary) hypertension: Secondary | ICD-10-CM | POA: Diagnosis not present

## 2022-05-17 NOTE — Progress Notes (Signed)
Chief Complaint:   OBESITY Michelle Huerta is here to discuss her progress with her obesity treatment plan along with follow-up of her obesity related diagnoses. Michelle Huerta is on the Category 2 Plan and states she is following her eating plan approximately 40% of the time. Michelle Huerta states she is doing 0 minutes 0 times per week.  Today's visit was #: 3 Starting weight: 268 lbs Starting date: 04/06/2022 Today's weight: 268 lbs Today's date: 05/04/2022 Total lbs lost to date: 0 Total lbs lost since last in-office visit: 1  Interim History: Has been working on gradual implementation of prescribed nutritional plan.  Michelle Huerta bought 12 donuts and candy bars and ate these over a span of 2 days.  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  For protein she has been eating liver putting and [place] necks, also eggs. She states eating 3 meals.  She is making pizza with slice of bread.  She eats canned collard greens, carrots, and beans.   Subjective:   1. Prediabetes Michelle Huerta's last A1c was 5.8 and insulin 20.  She has polyphagia and cravings for sweets.  2. Hypertension, essential Michelle Huerta's blood pressure is well controlled.  She is on ARB and hydrochlorothiazide.  She denies orthostasis.  Assessment/Plan:   1. Prediabetes Michelle Huerta was advised to take metformin as prescribed by her PCP, twice a day.  She will continue with her weight loss therapy and decrease simple sugars in her diet.  2. Hypertension, essential Michelle Huerta will continue with her weight loss therapy and she will continue her medications.  3. Obesity with current BMI of 42.6 Michelle Huerta is currently in the action stage of change. As such, her goal is to continue with weight loss efforts. She has agreed to the Category 2 Plan.   Patient is not implementing the meal plan completely.  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  We also need to continue to work on nutrition counseling as  she has been eating processed meats and calorie dense foods instead of lean sources of protein.  She is more in the contemplation stage of change.   Exercise goals: No exercise has been prescribed at this time.  Behavioral modification strategies: increasing lean protein intake, decreasing simple carbohydrates, increasing vegetables, increasing high fiber foods, no skipping meals, meal planning and cooking strategies, better snacking choices, and avoiding temptations.  Michelle Huerta has agreed to follow-up with our clinic in 2 weeks. She was informed of the importance of frequent follow-up visits to maximize her success with intensive lifestyle modifications for her multiple health conditions.   Objective:   Blood pressure 128/83, pulse 83, temperature 97.9 F (36.6 C), height 5' 6.5" (1.689 m), weight 268 lb 3.2 oz (121.7 kg), last menstrual period 08/01/2012, SpO2 100 %. Body mass index is 42.64 kg/m.  General: Cooperative, alert, well developed, in no acute distress. HEENT: Conjunctivae and lids unremarkable. Cardiovascular: Regular rhythm.  Lungs: Normal work of breathing. Neurologic: No focal deficits.   Lab Results  Component Value Date   CREATININE 0.74 04/06/2022   BUN 16 04/06/2022   NA 138 04/06/2022   K 4.2 04/06/2022   CL 102 04/06/2022   CO2 23 04/06/2022   Lab Results  Component Value Date   ALT 30 04/06/2022   AST 24 04/06/2022   ALKPHOS 105 04/06/2022   BILITOT 0.6 04/06/2022   Lab Results  Component Value Date   HGBA1C 5.8 (H)  04/06/2022   HGBA1C 5.9 (H) 05/20/2021   HGBA1C 5.8 (H) 05/19/2020   HGBA1C 5.8 (H) 12/10/2019   HGBA1C 5.4 06/24/2019   Lab Results  Component Value Date   INSULIN 20.0 04/06/2022   INSULIN 32.9 (H) 05/19/2020   INSULIN 44.4 (H) 12/10/2019   INSULIN 36.3 (H) 06/24/2019   INSULIN 37.3 (H) 02/07/2019   Lab Results  Component Value Date   TSH 1.300 04/06/2022   Lab Results  Component Value Date   CHOL 141 04/06/2022   HDL 78  04/06/2022   LDLCALC 49 04/06/2022   TRIG 70 04/06/2022   CHOLHDL 1.8 05/30/2018   Lab Results  Component Value Date   VD25OH 58.7 04/06/2022   VD25OH 44.3 05/19/2020   VD25OH 41.3 12/10/2019   Lab Results  Component Value Date   WBC 6.7 04/06/2022   HGB 12.4 04/06/2022   HCT 37.2 04/06/2022   MCV 83 04/06/2022   PLT 182 04/06/2022   Lab Results  Component Value Date   IRON 71 11/04/2021   TIBC 260 11/04/2021   FERRITIN 115.6 11/04/2021   Attestation Statements:   Reviewed by clinician on day of visit: allergies, medications, problem list, medical history, surgical history, family history, social history, and previous encounter notes.  Time spent on visit including pre-visit chart review and post-visit care and charting was 20 minutes.   Michelle Huerta, am acting as transcriptionist for Thomes Dinning, MD.  I have reviewed the above documentation for accuracy and completeness, and I agree with the above. -Thomes Dinning, MD

## 2022-05-26 ENCOUNTER — Encounter (INDEPENDENT_AMBULATORY_CARE_PROVIDER_SITE_OTHER): Payer: Self-pay | Admitting: Internal Medicine

## 2022-05-26 ENCOUNTER — Ambulatory Visit (INDEPENDENT_AMBULATORY_CARE_PROVIDER_SITE_OTHER): Payer: Medicare Other | Admitting: Internal Medicine

## 2022-05-26 VITALS — BP 114/75 | HR 73 | Temp 98.1°F | Ht 66.0 in | Wt 266.0 lb

## 2022-05-26 DIAGNOSIS — I10 Essential (primary) hypertension: Secondary | ICD-10-CM | POA: Diagnosis not present

## 2022-05-26 DIAGNOSIS — E669 Obesity, unspecified: Secondary | ICD-10-CM | POA: Diagnosis not present

## 2022-05-26 DIAGNOSIS — R7303 Prediabetes: Secondary | ICD-10-CM | POA: Diagnosis not present

## 2022-05-26 DIAGNOSIS — E66813 Obesity, class 3: Secondary | ICD-10-CM

## 2022-05-26 DIAGNOSIS — Z6841 Body Mass Index (BMI) 40.0 and over, adult: Secondary | ICD-10-CM | POA: Diagnosis not present

## 2022-06-12 NOTE — Progress Notes (Unsigned)
Chief Complaint:   OBESITY Michelle Huerta is here to discuss her progress with her obesity treatment plan along with follow-up of her obesity related diagnoses. Michelle Huerta is on the Category 2 Plan and states she is following her eating plan approximately 50% of the time. Michelle Huerta states she is not currently exercising.  Today's visit was #: 4 Starting weight: 268 lbs Starting date: 04/06/2022 Today's weight: 266 lbs Today's date: 05/26/2022 Total lbs lost to date: 2 Total lbs lost since last in-office visit: 2  Interim History: Michelle Huerta is doing well. She reports less eating out and reducing sweets, but pt has been eating doughnuts (although less). She is not skipping meals and has been using EVO in meal prep and eating 1/2 cup of peanuts.  Subjective:   1. Pre-diabetes Discussed labs with patient today. A1c 5.8. Michelle Huerta has a diagnosis of prediabetes based on her elevated HgA1c and was informed this puts her at greater risk of developing diabetes. She continues to work on diet and exercise to decrease her risk of diabetes. She denies nausea or hypoglycemia. Pt is on Metformin without side effects.  2. Essential hypertension Well controlled. Pt is on ARB/HCTZ. Her recent renal parameters were within normal limits.  Assessment/Plan:   1. Pre-diabetes Continue weight loss therapy and Metformin twice a day.  2. Essential hypertension Continue weight loss therapy. Continue regimen and monitor BP at home.  3. Obesity with current BMI of 43.0 Michelle Huerta is currently in the action stage of change. As such, her goal is to continue with weight loss efforts. She has agreed to the Category 2 Plan. Decrease EVO; decrease peanuts.  Exercise goals: No exercise has been prescribed at this time. We will address physical activity at next visit.  Behavioral modification strategies: increasing lean protein intake, decreasing simple carbohydrates, increasing vegetables, increasing water intake, no skipping meals, keeping  healthy foods in the home, better snacking choices, avoiding temptations, and planning for success.  Michelle Huerta has agreed to follow-up with our clinic in 2 weeks. She was informed of the importance of frequent follow-up visits to maximize her success with intensive lifestyle modifications for her multiple health conditions.   Objective:   Blood pressure 114/75, pulse 73, temperature 98.1 F (36.7 C), height '5\' 6"'$  (1.676 m), weight 266 lb (120.7 kg), last menstrual period 08/01/2012, SpO2 98 %. Body mass index is 42.93 kg/m.  General: Cooperative, alert, well developed, in no acute distress. HEENT: Conjunctivae and lids unremarkable. Cardiovascular: Regular rhythm.  Lungs: Normal work of breathing. Neurologic: No focal deficits.   Lab Results  Component Value Date   CREATININE 0.74 04/06/2022   BUN 16 04/06/2022   NA 138 04/06/2022   K 4.2 04/06/2022   CL 102 04/06/2022   CO2 23 04/06/2022   Lab Results  Component Value Date   ALT 30 04/06/2022   AST 24 04/06/2022   ALKPHOS 105 04/06/2022   BILITOT 0.6 04/06/2022   Lab Results  Component Value Date   HGBA1C 5.8 (H) 04/06/2022   HGBA1C 5.9 (H) 05/20/2021   HGBA1C 5.8 (H) 05/19/2020   HGBA1C 5.8 (H) 12/10/2019   HGBA1C 5.4 06/24/2019   Lab Results  Component Value Date   INSULIN 20.0 04/06/2022   INSULIN 32.9 (H) 05/19/2020   INSULIN 44.4 (H) 12/10/2019   INSULIN 36.3 (H) 06/24/2019   INSULIN 37.3 (H) 02/07/2019   Lab Results  Component Value Date   TSH 1.300 04/06/2022   Lab Results  Component Value Date   CHOL  141 04/06/2022   HDL 78 04/06/2022   LDLCALC 49 04/06/2022   TRIG 70 04/06/2022   CHOLHDL 1.8 05/30/2018   Lab Results  Component Value Date   VD25OH 58.7 04/06/2022   VD25OH 44.3 05/19/2020   VD25OH 41.3 12/10/2019   Lab Results  Component Value Date   WBC 6.7 04/06/2022   HGB 12.4 04/06/2022   HCT 37.2 04/06/2022   MCV 83 04/06/2022   PLT 182 04/06/2022   Lab Results  Component Value Date    IRON 71 11/04/2021   TIBC 260 11/04/2021   FERRITIN 115.6 11/04/2021   Attestation Statements:   Reviewed by clinician on day of visit: allergies, medications, problem list, medical history, surgical history, family history, social history, and previous encounter notes.  Time spent on visit including pre-visit chart review and post-visit care and charting was 20 minutes.   I, Kathlene November, BS, CMA, am acting as transcriptionist for Thomes Dinning, MD.  I have reviewed the above documentation for accuracy and completeness, and I agree with the above. -  ***

## 2022-06-14 ENCOUNTER — Ambulatory Visit (INDEPENDENT_AMBULATORY_CARE_PROVIDER_SITE_OTHER): Payer: Medicare Other | Admitting: Internal Medicine

## 2022-06-14 ENCOUNTER — Encounter (INDEPENDENT_AMBULATORY_CARE_PROVIDER_SITE_OTHER): Payer: Self-pay | Admitting: Internal Medicine

## 2022-06-14 VITALS — BP 133/82 | HR 77 | Temp 98.2°F | Ht 66.0 in | Wt 268.0 lb

## 2022-06-14 DIAGNOSIS — R7303 Prediabetes: Secondary | ICD-10-CM

## 2022-06-14 DIAGNOSIS — Z6841 Body Mass Index (BMI) 40.0 and over, adult: Secondary | ICD-10-CM

## 2022-06-14 DIAGNOSIS — E669 Obesity, unspecified: Secondary | ICD-10-CM | POA: Diagnosis not present

## 2022-06-14 MED ORDER — METFORMIN HCL 500 MG PO TABS: ORAL_TABLET | Freq: Two times a day (BID) | ORAL | 0 refills | Status: DC

## 2022-06-26 NOTE — Progress Notes (Signed)
Chief Complaint:   OBESITY Michelle Huerta is here to discuss her progress with her obesity treatment plan along with follow-up of her obesity related diagnoses. Michelle Huerta is on the Category 2 Plan and states she is following her eating plan approximately 30% of the time. Michelle Huerta states she is not currently exercising.  Today's visit was #: 5 Starting weight: 268 lbs Starting date: 04/06/2022 Today's weight: 268 lbs Today's date: 06/14/2022 Total lbs lost to date: 0 Total lbs lost since last in-office visit: +2  Interim History: Since 2022, Michelle Huerta has lost about 30 lbs. She has plateaued and has not implemented reduced calorie plan due to food choices.  During the holiday, she was gifted plates of food and had a hard time with portions and turning food away. She had been on Contrave in the past but had side effects so medication was discontinued.  Subjective:   1. Prediabetes Asymptomatic. Pt is at risk for progression. She is on Metformin twice a day without side effects.  Assessment/Plan:   1. Prediabetes Continue weight loss therapy.  Refill- metFORMIN (GLUCOPHAGE) 500 MG tablet; TAKE 1 TABLET (500 MG TOTAL) BY MOUTH 2 (TWO) TIMES DAILY WITH A MEAL.  Dispense: 60 tablet; Refill: 0  2. Obesity with current BMI of 43.3 Michelle Huerta is currently in the action stage of change. As such, her goal is to continue with weight loss efforts. She has agreed to the Category 2 Plan.   Increase adherence to nutritional therapy and awareness of calorie dense foods. Continue Metformin for incretin effect and diabetes prevention.  Exercise goals: No exercise has been prescribed at this time. Pre-contemplative.  Behavioral modification strategies: increasing lean protein intake, decreasing simple carbohydrates, increasing vegetables, increasing water intake, meal planning and cooking strategies, keeping healthy foods in the home, better snacking choices, celebration eating strategies, avoiding temptations, and planning  for success.  Michelle Huerta has agreed to follow-up with our clinic in 3 weeks. She was informed of the importance of frequent follow-up visits to maximize her success with intensive lifestyle modifications for her multiple health conditions.   Objective:   Blood pressure 133/82, pulse 77, temperature 98.2 F (36.8 C), height '5\' 6"'$  (1.676 m), weight 268 lb (121.6 kg), last menstrual period 08/01/2012, SpO2 100 %. Body mass index is 43.26 kg/m.  General: Cooperative, alert, well developed, in no acute distress. HEENT: Conjunctivae and lids unremarkable. Cardiovascular: Regular rhythm.  Lungs: Normal work of breathing. Neurologic: No focal deficits.   Lab Results  Component Value Date   CREATININE 0.74 04/06/2022   BUN 16 04/06/2022   NA 138 04/06/2022   K 4.2 04/06/2022   CL 102 04/06/2022   CO2 23 04/06/2022   Lab Results  Component Value Date   ALT 30 04/06/2022   AST 24 04/06/2022   ALKPHOS 105 04/06/2022   BILITOT 0.6 04/06/2022   Lab Results  Component Value Date   HGBA1C 5.8 (H) 04/06/2022   HGBA1C 5.9 (H) 05/20/2021   HGBA1C 5.8 (H) 05/19/2020   HGBA1C 5.8 (H) 12/10/2019   HGBA1C 5.4 06/24/2019   Lab Results  Component Value Date   INSULIN 20.0 04/06/2022   INSULIN 32.9 (H) 05/19/2020   INSULIN 44.4 (H) 12/10/2019   INSULIN 36.3 (H) 06/24/2019   INSULIN 37.3 (H) 02/07/2019   Lab Results  Component Value Date   TSH 1.300 04/06/2022   Lab Results  Component Value Date   CHOL 141 04/06/2022   HDL 78 04/06/2022   LDLCALC 49 04/06/2022  TRIG 70 04/06/2022   CHOLHDL 1.8 05/30/2018   Lab Results  Component Value Date   VD25OH 58.7 04/06/2022   VD25OH 44.3 05/19/2020   VD25OH 41.3 12/10/2019   Lab Results  Component Value Date   WBC 6.7 04/06/2022   HGB 12.4 04/06/2022   HCT 37.2 04/06/2022   MCV 83 04/06/2022   PLT 182 04/06/2022   Lab Results  Component Value Date   IRON 71 11/04/2021   TIBC 260 11/04/2021   FERRITIN 115.6 11/04/2021    Attestation Statements:   Reviewed by clinician on day of visit: allergies, medications, problem list, medical history, surgical history, family history, social history, and previous encounter notes.  Time spent on visit including pre-visit chart review and post-visit care and charting was 20 minutes.   I, Kathlene November, BS, CMA, am acting as transcriptionist for Thomes Dinning, MD.  I have reviewed the above documentation for accuracy and completeness, and I agree with the above. -Thomes Dinning, MD

## 2022-07-18 ENCOUNTER — Ambulatory Visit (INDEPENDENT_AMBULATORY_CARE_PROVIDER_SITE_OTHER): Payer: Medicare Other | Admitting: Internal Medicine

## 2022-07-18 ENCOUNTER — Other Ambulatory Visit (INDEPENDENT_AMBULATORY_CARE_PROVIDER_SITE_OTHER): Payer: Self-pay | Admitting: Internal Medicine

## 2022-07-18 ENCOUNTER — Encounter (INDEPENDENT_AMBULATORY_CARE_PROVIDER_SITE_OTHER): Payer: Self-pay | Admitting: Internal Medicine

## 2022-07-18 ENCOUNTER — Other Ambulatory Visit: Payer: Self-pay | Admitting: Family Medicine

## 2022-07-18 VITALS — BP 136/87 | HR 81 | Temp 98.0°F | Ht 66.0 in | Wt 270.0 lb

## 2022-07-18 DIAGNOSIS — Z6841 Body Mass Index (BMI) 40.0 and over, adult: Secondary | ICD-10-CM

## 2022-07-18 DIAGNOSIS — R7303 Prediabetes: Secondary | ICD-10-CM

## 2022-07-18 DIAGNOSIS — E669 Obesity, unspecified: Secondary | ICD-10-CM

## 2022-07-18 DIAGNOSIS — G4733 Obstructive sleep apnea (adult) (pediatric): Secondary | ICD-10-CM

## 2022-07-18 DIAGNOSIS — Z1231 Encounter for screening mammogram for malignant neoplasm of breast: Secondary | ICD-10-CM

## 2022-07-18 MED ORDER — METFORMIN HCL 500 MG PO TABS: ORAL_TABLET | Freq: Two times a day (BID) | ORAL | 0 refills | Status: DC

## 2022-07-18 NOTE — Progress Notes (Signed)
Chief Complaint:   OBESITY Michelle Huerta is here to discuss her progress with her obesity treatment plan along with follow-up of her obesity related diagnoses. Michelle Huerta is on the Category 2 Plan and states she is following her eating plan approximately 30% of the time. Michelle Huerta states she is doing 0 minutes 0 times per week.  Today's visit was #: 6 Starting weight: 268 lbs Starting date: 04/06/2022 Today's weight: 270 lbs Today's date: 07/18/2022 Total lbs lost to date: 0 Total lbs lost since last in-office visit: 0  Interim History: Michelle Huerta presents today for follow-up.  Her adherence with prescribed nutritional plan is low.  She has been slow to make changes and implement plan.  When asked about barriers she states the holidays.  When asked about how confident she feels about implementing plan over the next few weeks she feels very confident.  She denies abnormal cravings, problems controlling portions or consumption of liquid calories.  She is contemplating doing chair exercises for strengthening.  She drinks about 5-6 beers at a time on weekends mostly.  Subjective:   1. OSA (obstructive sleep apnea) On CPAP therapy.  She is scheduled to have a repeat sleep study today to determine adequacy of PAP therapy.  2. Prediabetes Most recent A1c was 5.8.  She is on metformin 500 mg twice a day without any adverse effects.  Assessment/Plan:   1. OSA (obstructive sleep apnea) Continue weight loss therapy.  Losing 15% of body weight will reduce AHI.  2. Prediabetes She will continue with weight loss therapy, we discussed the benefits of increasing physical activity with a goal of 150 minutes a week.  She will continue on metformin.  Medication was refilled.   - metFORMIN (GLUCOPHAGE) 500 MG tablet; TAKE 1 TABLET (500 MG TOTAL) BY MOUTH 2 (TWO) TIMES DAILY WITH A MEAL.  Dispense: 60 tablet; Refill: 0  3. Obesity with current BMI of 43.7 Patient will continue to work on implementation and plan.  She  was counseled on reducing consumption of alcohol.  We also discussed the benefits of strengthening exercises and weight management.  Michelle Huerta is currently in the action stage of change. As such, her goal is to continue with weight loss efforts. She has agreed to the Category 2 Plan.   Exercise goals: All adults should avoid inactivity. Some physical activity is better than none, and adults who participate in any amount of physical activity gain some health benefits.  Behavioral modification strategies: increasing water intake, no skipping meals, meal planning and cooking strategies, avoiding temptations, and planning for success.  Michelle Huerta has agreed to follow-up with our clinic in 3 weeks. She was informed of the importance of frequent follow-up visits to maximize her success with intensive lifestyle modifications for her multiple health conditions.   Objective:   Blood pressure 136/87, pulse 81, temperature 98 F (36.7 C), height '5\' 6"'$  (1.676 m), weight 270 lb (122.5 kg), last menstrual period 08/01/2012, SpO2 98 %. Body mass index is 43.58 kg/m.  General: Cooperative, alert, well developed, in no acute distress. HEENT: Conjunctivae and lids unremarkable. Cardiovascular: Regular rhythm.  Lungs: Normal work of breathing. Neurologic: No focal deficits.   Lab Results  Component Value Date   CREATININE 0.74 04/06/2022   BUN 16 04/06/2022   NA 138 04/06/2022   K 4.2 04/06/2022   CL 102 04/06/2022   CO2 23 04/06/2022   Lab Results  Component Value Date   ALT 30 04/06/2022   AST 24 04/06/2022  ALKPHOS 105 04/06/2022   BILITOT 0.6 04/06/2022   Lab Results  Component Value Date   HGBA1C 5.8 (H) 04/06/2022   HGBA1C 5.9 (H) 05/20/2021   HGBA1C 5.8 (H) 05/19/2020   HGBA1C 5.8 (H) 12/10/2019   HGBA1C 5.4 06/24/2019   Lab Results  Component Value Date   INSULIN 20.0 04/06/2022   INSULIN 32.9 (H) 05/19/2020   INSULIN 44.4 (H) 12/10/2019   INSULIN 36.3 (H) 06/24/2019   INSULIN 37.3  (H) 02/07/2019   Lab Results  Component Value Date   TSH 1.300 04/06/2022   Lab Results  Component Value Date   CHOL 141 04/06/2022   HDL 78 04/06/2022   LDLCALC 49 04/06/2022   TRIG 70 04/06/2022   CHOLHDL 1.8 05/30/2018   Lab Results  Component Value Date   VD25OH 58.7 04/06/2022   VD25OH 44.3 05/19/2020   VD25OH 41.3 12/10/2019   Lab Results  Component Value Date   WBC 6.7 04/06/2022   HGB 12.4 04/06/2022   HCT 37.2 04/06/2022   MCV 83 04/06/2022   PLT 182 04/06/2022   Lab Results  Component Value Date   IRON 71 11/04/2021   TIBC 260 11/04/2021   FERRITIN 115.6 11/04/2021   Attestation Statements:   Reviewed by clinician on day of visit: allergies, medications, problem list, medical history, surgical history, family history, social history, and previous encounter notes.   Wilhemena Durie, am acting as transcriptionist for Thomes Dinning, MD.  I have reviewed the above documentation for accuracy and completeness, and I agree with the above. -Thomes Dinning, MD

## 2022-07-20 ENCOUNTER — Encounter: Payer: Self-pay | Admitting: Podiatry

## 2022-07-20 ENCOUNTER — Ambulatory Visit: Payer: Medicare Other | Admitting: Podiatry

## 2022-07-20 VITALS — BP 124/73

## 2022-07-20 DIAGNOSIS — M79674 Pain in right toe(s): Secondary | ICD-10-CM | POA: Diagnosis not present

## 2022-07-20 DIAGNOSIS — M79675 Pain in left toe(s): Secondary | ICD-10-CM

## 2022-07-20 DIAGNOSIS — B351 Tinea unguium: Secondary | ICD-10-CM

## 2022-07-20 NOTE — Progress Notes (Signed)
  Subjective:  Patient ID: Michelle Huerta, female    DOB: 1960/10/13,  MRN: [id number]  Michelle Huerta presents to clinic today for painful thick toenails that are difficult to trim. Pain interferes with ambulation. Aggravating factors include wearing enclosed shoe gear. Pain is relieved with periodic professional debridement.  Chief Complaint  Patient presents with   Nail Problem    [facility] PCP-Cynthia White PCP VST -03/2022   New problem(s): None.   PCP is Harlan Stains, MD.  Allergies  Allergen Reactions   Naltrexone-Bupropion Hcl Er     Other reaction(s): did not like how she felt on it   Penicillins Rash and Other (See Comments)    Has patient had a PCN reaction causing immediate rash, facial/tongue/throat swelling, SOB or lightheadedness with hypotension: Yes Has patient had a PCN reaction causing severe rash involving mucus membranes or skin necrosis: No Has patient had a PCN reaction that required hospitalization: No Has patient had a PCN reaction occurring within the last 10 years: No If all of the above answers are "NO", then may proceed with Cephalosporin use.    Reclast [Zoledronic Acid] Nausea And Vomiting    Review of Systems: Negative except as noted in the HPI.  Objective: No changes noted in today's physical examination. Vitals:   07/20/22 1050  BP: 124/73   Michelle Huerta is a pleasant 62 y.o. female morbidly obese in NAD. AAO x 3.  Vascular Examination: Capillary refill time immediate b/l. Vascular status intact b/l with palpable pedal pulses. Pedal hair present b/l. No edema. No pain with calf compression b/l. Skin temperature gradient WNL b/l. No cyanosis or clubbing noted b/l LE.  Neurological Examination: Sensation grossly intact b/l with 10 gram monofilament. Vibratory sensation intact b/l.   Dermatological Examination: Pedal skin with normal turgor, texture and tone b/l. Toenails 1-5 b/l thick, discolored, elongated with subungual debris and pain on dorsal  palpation. No open wounds b/l LE. No interdigital macerations noted b/l LE.  Musculoskeletal Examination: Normal muscle strength 5/5 to all lower extremity muscle groups bilaterally. Hammertoe(s) noted to the bilateral 2nd toes.. No pain, crepitus or joint limitation noted with ROM b/l LE.  Patient ambulates independently without assistive aids.  Radiographs: None  Last A1c:      Latest Ref Rng & Units 04/06/2022   10:06 AM  Hemoglobin A1C  Hemoglobin-A1c 4.8 - 5.6 % 5.8    Assessment/Plan: 1. Pain due to onychomycosis of toenails of both feet     No orders of the defined types were placed in this encounter.  -Patient was evaluated and treated. All patient's and/or POA's questions/concerns answered on today's visit. -Examined patient. -Continue supportive shoe gear daily. -Toenails 1-5 b/l were debrided in length and girth with sterile nail nippers and dremel without iatrogenic bleeding.  -Patient/POA to call should there be question/concern in the interim.   Return in about 3 months (around 10/19/2022).  Marzetta Board, DPM

## 2022-08-02 ENCOUNTER — Other Ambulatory Visit (HOSPITAL_COMMUNITY): Payer: Self-pay

## 2022-08-02 MED ORDER — XIIDRA 5 % OP SOLN
1.0000 [drp] | Freq: Two times a day (BID) | OPHTHALMIC | 11 refills | Status: DC
  Filled 2022-08-02: qty 60, 30d supply, fill #0

## 2022-08-08 ENCOUNTER — Ambulatory Visit (INDEPENDENT_AMBULATORY_CARE_PROVIDER_SITE_OTHER): Payer: Medicare Other | Admitting: Internal Medicine

## 2022-08-25 ENCOUNTER — Encounter (INDEPENDENT_AMBULATORY_CARE_PROVIDER_SITE_OTHER): Payer: Self-pay | Admitting: Internal Medicine

## 2022-08-25 ENCOUNTER — Ambulatory Visit (INDEPENDENT_AMBULATORY_CARE_PROVIDER_SITE_OTHER): Payer: Medicare Other | Admitting: Internal Medicine

## 2022-08-25 VITALS — BP 132/83 | HR 74 | Temp 97.6°F | Ht 66.0 in | Wt 273.0 lb

## 2022-08-25 DIAGNOSIS — E669 Obesity, unspecified: Secondary | ICD-10-CM | POA: Diagnosis not present

## 2022-08-25 DIAGNOSIS — R632 Polyphagia: Secondary | ICD-10-CM

## 2022-08-25 DIAGNOSIS — G4733 Obstructive sleep apnea (adult) (pediatric): Secondary | ICD-10-CM | POA: Diagnosis not present

## 2022-08-25 DIAGNOSIS — Z6841 Body Mass Index (BMI) 40.0 and over, adult: Secondary | ICD-10-CM

## 2022-08-25 DIAGNOSIS — R7303 Prediabetes: Secondary | ICD-10-CM

## 2022-08-25 MED ORDER — BUPROPION HCL ER (SR) 100 MG PO TB12: 100.0000 mg | ORAL_TABLET | Freq: Every day | ORAL | 0 refills | Status: DC

## 2022-08-25 MED ORDER — METFORMIN HCL 500 MG PO TABS: ORAL_TABLET | Freq: Two times a day (BID) | ORAL | 0 refills | Status: DC

## 2022-08-25 NOTE — Progress Notes (Signed)
Office: [phone]  /  Fax: [facility] Height: 5' 6"$  (1.676 m) Weight: 273 lb (123.8 kg) Temp: 97.6 F (36.4 C) Pulse Rate: 74 BP: 132/83 SpO2: 99 % Fasting: n Labs: n Today's Visit #: 7 Weight at Last VIsit: 270 lb Weight Lost Since Last Visit: +3  Body Fat %: 50.7 % Fat Mass (lbs): 138.4 lbs Muscle Mass (lbs): 127.8 lbs Total Body Water (lbs): 84.4 lbs Visceral Fat Rating : 17 Peak Weight: 304 lb Starting Date: 03/27/22 Starting Weight: 268 lb Total Weight Loss (lbs): 0 lb (0 kg)    HPI  Chief Complaint: OBESITY  Michelle Huerta is here to discuss her progress with her obesity treatment plan. She is on the the Category 3 Plan and states she is following her eating plan approximately 0 % of the time. She states she is not.   Interval History:  Since last office visit she has gained 3 pounds.  She is still interested in losing weight but has not made dietary changes.  Physical activity levels are also low.  She states that he stays hungry all the time and has a lot of cravings which according to her makes it difficult to eat healthy.   Pharmacotherapy: None  PHYSICAL EXAM:  Blood pressure 132/83, pulse 74, temperature 97.6 F (36.4 C), height 5' 6"$  (1.676 m), weight 273 lb (123.8 kg), last menstrual period 08/01/2012, SpO2 99 %. Body mass index is 44.06 kg/m.  General: She is overweight, cooperative, alert, well developed, and in no acute distress. PSYCH: Has normal mood, affect and thought process.   HEENT: EOMI, sclerae are anicteric. Lungs: Normal breathing effort, no conversational dyspnea. Extremities: No edema.  Neurologic: No gross sensory or motor deficits. No tremors or fasciculations noted.    DIAGNOSTIC DATA REVIEWED:  BMET    Component Value Date/Time   NA 138 04/06/2022 1006   K 4.2 04/06/2022 1006   CL 102 04/06/2022 1006   CO2 23 04/06/2022 1006   GLUCOSE 84 04/06/2022 1006   GLUCOSE 88  10/29/2021 0926   BUN 16 04/06/2022 1006   CREATININE 0.74 04/06/2022 1006   CALCIUM 9.5 04/06/2022 1006   GFRNONAA >60 06/25/2021 1311   GFRAA 99 05/19/2020 0822   Lab Results  Component Value Date   HGBA1C 5.8 (H) 04/06/2022   HGBA1C 6.0 (A) 03/09/2018   Lab Results  Component Value Date   INSULIN 20.0 04/06/2022   INSULIN 41.4 (H) 05/30/2018   Lab Results  Component Value Date   TSH 1.300 04/06/2022   CBC    Component Value Date/Time   WBC 6.7 04/06/2022 1006   WBC 5.7 10/29/2021 0926   RBC 4.48 04/06/2022 1006   RBC 4.32 10/29/2021 0926   HGB 12.4 04/06/2022 1006   HCT 37.2 04/06/2022 1006   PLT 182 04/06/2022 1006   MCV 83 04/06/2022 1006   MCH 27.7 04/06/2022 1006   MCH 27.3 06/25/2021 1311   MCHC 33.3 04/06/2022 1006   MCHC 32.6 10/29/2021 0926   RDW 14.1 04/06/2022 1006   Iron Studies    Component Value Date/Time   IRON 71 11/04/2021 0853   TIBC 260 11/04/2021 0853   FERRITIN 115.6 11/04/2021 0853   IRONPCTSAT 27 11/04/2021 0853   Lipid Panel     Component Value Date/Time   CHOL 141 04/06/2022 1006   TRIG 70 04/06/2022 1006   HDL 78 04/06/2022 1006   CHOLHDL 1.8 05/30/2018 1116   LDLCALC  49 04/06/2022 1006   Hepatic Function Panel     Component Value Date/Time   PROT 8.2 04/06/2022 1006   ALBUMIN 4.1 04/06/2022 1006   AST 24 04/06/2022 1006   ALT 30 04/06/2022 1006   ALKPHOS 105 04/06/2022 1006   BILITOT 0.6 04/06/2022 1006      Component Value Date/Time   TSH 1.300 04/06/2022 1006   Nutritional Lab Results  Component Value Date   VD25OH 58.7 04/06/2022   VD25OH 44.3 05/19/2020   VD25OH 41.3 12/10/2019     ASSESSMENT AND PLAN  TREATMENT PLAN FOR OBESITY:  Recommended Dietary Goals  Zaila is currently in the action stage of change. As such, her goal is to continue weight management plan. She has agreed to change to practicing portion control and making smarter food choices, such as increasing vegetables and decreasing simple  carbohydrates.  Patient will be scheduled for 40-minute appointment to review set goals, assess nutritional knowledge and exclude an underlying eating disorder.  Behavioral Intervention  We discussed the following Behavioral Modification Strategies today: increasing lean protein intake, decreasing simple carbohydrates , increasing vegetables, increasing lower sugar fruits, avoid skipping meals, increase water intake, think about ways to increase physical activity, and avoid or reduce consumption of processed foods.  Additional resources provided today: NA  Recommended Physical Activity Goals  Sigal has been advised to work up to 150 minutes of moderate intensity aerobic activity a week and strengthening exercises 2-3 times per week for cardiovascular health, weight loss maintenance and preservation of muscle mass.   She has agreed to increase physical activity in their day and reduce sedentary time (increase NEAT).    Pharmacotherapy We discussed various medication options to help Audrianna with her weight loss efforts and we both agreed to start bupropion 100 mg 1 tablet daily to assist with cravings.  Continue metformin twice a day with primary indication of diabetes prevention  ASSOCIATED CONDITIONS ADDRESSED TODAY  OSA (obstructive sleep apnea) Assessment & Plan: Suboptimal adherence.  This may contribute to weight gain.  Counseled the importance of PAP therapy and risk of untreated sleep apnea.   Prediabetes Assessment & Plan: Most recent A1c is  Lab Results  Component Value Date   HGBA1C 5.8 (H) 04/06/2022   with associated elevated insulin levels.  Patient informed of disease state and risk of progression. This may contribute to abnormal cravings, fatigue and diabetes complications without having diabetes.   Patient has not implemented nutritional changes.  We will continue to work with her and assess knowledge And next visit.  She will continue on metformin twice a day for diabetes  prevention.   Orders: -     metFORMIN HCl; TAKE 1 TABLET (500 MG TOTAL) BY MOUTH 2 (TWO) TIMES DAILY WITH A MEAL.  Dispense: 60 tablet; Refill: 0  Obesity with current BMI of 44 -     buPROPion HCl ER (SR); Take 1 tablet (100 mg total) by mouth daily.  Dispense: 30 tablet; Refill: 0  Polyphagia Assessment & Plan: Neurohormonal rule out also psychologic.  We will start bupropion 100 mg 1 tablet daily.  Will also consider referral to behavioral health specialist we do identify that she does have an eating disorder.  It seems that she has uncontrollable urges to eat unhealthy foods which makes adherence to a reduced calorie nutrition plan difficult.       Return in about 2 weeks (around 09/08/2022) for For Weight Mangement with Dr. Gerarda Fraction needs 40 minutes.. She was informed of the  importance of frequent follow up visits to maximize her success with intensive lifestyle modifications for her multiple health conditions.   ATTESTASTION STATEMENTS:  Reviewed by clinician on day of visit: allergies, medications, problem list, medical history, surgical history, family history, social history, and previous encounter notes.   Time spent on visit including pre-visit chart review and post-visit care and charting was 30 minutes.    Thomes Dinning, MD

## 2022-08-25 NOTE — Assessment & Plan Note (Signed)
Most recent A1c is  Lab Results  Component Value Date   HGBA1C 5.8 (H) 04/06/2022   with associated elevated insulin levels.  Patient informed of disease state and risk of progression. This may contribute to abnormal cravings, fatigue and diabetes complications without having diabetes.   Patient has not implemented nutritional changes.  We will continue to work with her and assess knowledge And next visit.  She will continue on metformin twice a day for diabetes prevention.

## 2022-08-25 NOTE — Assessment & Plan Note (Signed)
Neurohormonal rule out also psychologic.  We will start bupropion 100 mg 1 tablet daily.  Will also consider referral to behavioral health specialist we do identify that she does have an eating disorder.  It seems that she has uncontrollable urges to eat unhealthy foods which makes adherence to a reduced calorie nutrition plan difficult.

## 2022-08-25 NOTE — Assessment & Plan Note (Addendum)
Suboptimal adherence.  This may contribute to weight gain.  Counseled the importance of PAP therapy and risk of untreated sleep apnea.

## 2022-08-31 ENCOUNTER — Ambulatory Visit: Payer: Medicare Other | Admitting: Podiatry

## 2022-09-09 ENCOUNTER — Ambulatory Visit
Admission: RE | Admit: 2022-09-09 | Discharge: 2022-09-09 | Disposition: A | Discharge status: Home / Self Care | Payer: Medicare Other | Source: Ambulatory Visit | Attending: Family Medicine | Admitting: Family Medicine

## 2022-09-09 DIAGNOSIS — Z1231 Encounter for screening mammogram for malignant neoplasm of breast: Secondary | ICD-10-CM

## 2022-09-14 ENCOUNTER — Ambulatory Visit (INDEPENDENT_AMBULATORY_CARE_PROVIDER_SITE_OTHER): Payer: Medicare Other | Admitting: Internal Medicine

## 2022-10-25 ENCOUNTER — Ambulatory Visit: Payer: Medicare Other | Admitting: Podiatry

## 2023-01-04 DIAGNOSIS — G4733 Obstructive sleep apnea (adult) (pediatric): Secondary | ICD-10-CM | POA: Diagnosis not present

## 2023-01-21 DIAGNOSIS — G580 Intercostal neuropathy: Secondary | ICD-10-CM | POA: Diagnosis not present

## 2023-01-27 DIAGNOSIS — M170 Bilateral primary osteoarthritis of knee: Secondary | ICD-10-CM | POA: Diagnosis not present

## 2023-01-27 DIAGNOSIS — M549 Dorsalgia, unspecified: Secondary | ICD-10-CM | POA: Diagnosis not present

## 2023-01-27 DIAGNOSIS — L930 Discoid lupus erythematosus: Secondary | ICD-10-CM | POA: Diagnosis not present

## 2023-01-27 DIAGNOSIS — M1990 Unspecified osteoarthritis, unspecified site: Secondary | ICD-10-CM | POA: Diagnosis not present

## 2023-01-27 DIAGNOSIS — M889 Osteitis deformans of unspecified bone: Secondary | ICD-10-CM | POA: Diagnosis not present

## 2023-01-27 DIAGNOSIS — M25569 Pain in unspecified knee: Secondary | ICD-10-CM | POA: Diagnosis not present

## 2023-01-30 DIAGNOSIS — M170 Bilateral primary osteoarthritis of knee: Secondary | ICD-10-CM | POA: Diagnosis not present

## 2023-02-10 ENCOUNTER — Ambulatory Visit: Payer: Medicare Other | Admitting: Podiatry

## 2023-02-10 DIAGNOSIS — M659 Synovitis and tenosynovitis, unspecified: Secondary | ICD-10-CM

## 2023-02-10 NOTE — Progress Notes (Signed)
Subjective:  Patient ID: Michelle Huerta, female    DOB: Jun 20, 1961,  MRN: [id number]  Chief Complaint  Patient presents with   Toe Pain    62 y.o. female presents with the above complaint.  Patient with right hallux IPJ capsulitis.  Patient states pain for touch is progressive and worse worse with ambulation is with pressure she has not seen MRIs prior to seeing me.  She would like for me to discuss steroid injection.  She states that has helped in the past.  She   Review of Systems: Negative except as noted in the HPI. Denies N/V/F/Ch.  Past Medical History:  Diagnosis Date   Allergic urticaria 07/02/2019   Allergy    Arthritis of both knees    Asthma    Back pain    Bilateral chronic knee pain    Chronic back pain    Constipation    DDD (degenerative disc disease), cervical    Genital herpes    HSV type 2 at rectum, culture positive 3/15   GERD (gastroesophageal reflux disease)    Glaucoma    Grade I diastolic dysfunction 06/25/2015   .noted on ECHO. , Murmur   History of colon polyps    HLD (hyperlipidemia)    Hypertension    Insomnia    Internal hemorrhoids    Lactose intolerance    Lupus (HCC) 2008   LVH (left ventricular hypertrophy) 06/25/2015   Mild, noted on ECHO    Mild sleep apnea    cpap   Morbid obesity (HCC) 06/11/2015   Osteoporosis    Plantar fasciitis, bilateral    Pre-diabetes    Pt takes metformin   Sleep apnea    Thrombocytopenia (HCC)    TR (tricuspid regurgitation) 06/25/2015   Trace, noted on ECHO    Tubular adenoma polyp of rectum    8/12, Dr Russella Dar, 5 yr follow up    Uterine fibroid 10/19/2017   noted on CT pelvis   Vitamin D deficiency     Current Outpatient Medications:    albuterol (VENTOLIN HFA) 108 (90 Base) MCG/ACT inhaler, INHALE 2 PUFFS INTO THE LUNGS EVERY 6 HOURS AS NEEDED FOR WHEEZING OR SHORTNESS OF BREATH., Disp: 18 g, Rfl: 0   B Complex-Biotin-FA (B-COMPLEX PO), Take by mouth., Disp: , Rfl:    buPROPion ER (WELLBUTRIN SR)  100 MG 12 hr tablet, Take 1 tablet (100 mg total) by mouth daily., Disp: 30 tablet, Rfl: 0   cetirizine (ZYRTEC) 10 MG tablet, Take 10 mg by mouth daily., Disp: , Rfl:    Cyanocobalamin (B-12) 500 MCG TABS, Take by mouth., Disp: , Rfl:    cyclobenzaprine (FLEXERIL) 10 MG tablet, Take 10 mg by mouth at bedtime as needed for muscle spasms., Disp: , Rfl:    docusate sodium (COLACE) 100 MG capsule, Take 100 mg by mouth daily., Disp: , Rfl:    fluticasone (FLONASE) 50 MCG/ACT nasal spray, Place 2 sprays into both nostrils daily., Disp: 16 g, Rfl: 6   ibuprofen (ADVIL) 800 MG tablet, Take 800 mg by mouth every 8 (eight) hours as needed for mild pain or moderate pain., Disp: , Rfl:    metFORMIN (GLUCOPHAGE) 500 MG tablet, TAKE 1 TABLET (500 MG TOTAL) BY MOUTH 2 (TWO) TIMES DAILY WITH A MEAL., Disp: 60 tablet, Rfl: 0   Omega-3 Fatty Acids (OMEGA-3 PO), Take by mouth. Omega-7, Disp: , Rfl:    omeprazole (PRILOSEC) 40 MG capsule, Take 1 capsule (40 mg total) by mouth 2 (two)  times daily., Disp: 60 capsule, Rfl: 2   telmisartan-hydrochlorothiazide (MICARDIS HCT) 80-12.5 MG tablet, Take 1 tablet by mouth daily., Disp: , Rfl:    Vitamin D, Ergocalciferol, (DRISDOL) 1.25 MG (50000 UNIT) CAPS capsule, Take 1 capsule (50,000 Units total) by mouth every 7 (seven) days., Disp: 4 capsule, Rfl: 0  Social History   Tobacco Use  Smoking Status Former   Current packs/day: 0.00   Types: Cigars, Cigarettes   Quit date: 08/11/2008   Years since quitting: 14.5  Smokeless Tobacco Never  Tobacco Comments   smoked Black&Milds, 1 pack per day (5 in a pack)    Allergies  Allergen Reactions   Naltrexone-Bupropion Hcl Er     Other reaction(s): did not like how she felt on it   Penicillins Rash and Other (See Comments)    Has patient had a PCN reaction causing immediate rash, facial/tongue/throat swelling, SOB or lightheadedness with hypotension: Yes Has patient had a PCN reaction causing severe rash involving mucus  membranes or skin necrosis: No Has patient had a PCN reaction that required hospitalization: No Has patient had a PCN reaction occurring within the last 10 years: No If all of the above answers are "NO", then may proceed with Cephalosporin use.    Reclast [Zoledronic Acid] Nausea And Vomiting   Objective:  There were no vitals filed for this visit. There is no height or weight on file to calculate BMI. Constitutional Well developed. Well nourished.  Vascular Dorsalis pedis pulses palpable bilaterally. Posterior tibial pulses palpable bilaterally. Capillary refill normal to all digits.  No cyanosis or clubbing noted. Pedal hair growth normal.  Neurologic Normal speech. Oriented to person, place, and time. Epicritic sensation to light touch grossly present bilaterally.  Dermatologic Nails well groomed and normal in appearance. No open wounds. No skin lesions.  Orthopedic: Pain on palpation right hallux IPJ.  Pain with range of motion of the IPJ.  No pain at the metatarsophalangeal joint no nail pain.   Radiographs: Left none  Assessment:   1. Synovitis of toe    Plan:  Patient was evaluated and treated and all questions answered.   Right hallux IPJ capsulitis/synovitis -All questions and concerns were discussed with the patient in extensive detail.  Given the amount of pain that she is having she will benefit from a steroid injection of decrease inflammatory component surgical pain.  Patient agrees with plan like proceed with steroid injection -A steroid injection was performed at right hallux IPJ using 1% plain Lidocaine and 10 mg of Kenalog. This was well tolerated. -Shoe gear modification was discussed   No follow-ups on file.

## 2023-02-27 ENCOUNTER — Ambulatory Visit (HOSPITAL_BASED_OUTPATIENT_CLINIC_OR_DEPARTMENT_OTHER): Payer: Medicare Other | Admitting: Family

## 2023-02-27 ENCOUNTER — Encounter (HOSPITAL_BASED_OUTPATIENT_CLINIC_OR_DEPARTMENT_OTHER): Payer: Self-pay | Admitting: Family

## 2023-02-27 VITALS — BP 130/82 | HR 75 | Ht 66.0 in | Wt 281.8 lb

## 2023-02-27 DIAGNOSIS — Z6841 Body Mass Index (BMI) 40.0 and over, adult: Secondary | ICD-10-CM

## 2023-02-27 DIAGNOSIS — G4733 Obstructive sleep apnea (adult) (pediatric): Secondary | ICD-10-CM

## 2023-02-27 DIAGNOSIS — I10 Essential (primary) hypertension: Secondary | ICD-10-CM | POA: Diagnosis not present

## 2023-02-27 DIAGNOSIS — R252 Cramp and spasm: Secondary | ICD-10-CM | POA: Diagnosis not present

## 2023-02-27 NOTE — Patient Instructions (Addendum)
Medication Instructions:  Continue your current medications.   *If you need a refill on your cardiac medications before your next appointment, please call your pharmacy*  Labs: Your physician recommends that you return for lab work today: BMP, magnesium  Follow-Up: At [facility], you and your health needs are our priority.  As part of our continuing mission to provide you with exceptional heart care, we have created designated Provider Care Teams.  These Care Teams include your primary Cardiologist (physician) and Advanced Practice Providers (APPs -  Physician Assistants and Nurse Practitioners) who all work together to provide you with the care you need, when you need it.  We recommend signing up for the patient portal called "MyChart".  Sign up information is provided on this After Visit Summary.  MyChart is used to connect with patients for Virtual Visits (Telemedicine).  Patients are able to view lab/test results, encounter notes, upcoming appointments, etc.  Non-urgent messages can be sent to your provider as well.   To learn more about what you can do with MyChart, go to [email or web address].    Your next appointment:   6 month(s)  Provider:   Chilton Si, MD    Other Instructions

## 2023-02-27 NOTE — Progress Notes (Signed)
Cardiology Office Note:  .   Date:  02/27/2023  ID:  Michelle Huerta, DOB 11-Jan-1961, MRN [id number] PCP: Laurann Montana, MD  [facility] Providers Cardiologist:  Chilton Si, MD    History of Present Illness: .   Michelle Huerta is a 63 y.o. female with history of hypertension, morbid obesity, lupus, Paget's disease, OSA on CPAP, H. pylori.  Initially seen 06/2015 prior to starting [facility].  Echo 06/2015 LVEF 55-60%, grade 1 diastolic dysfunction.  Nuclear stress test 12/2019 negative for ischemia.  Last seen 03/02/22.  She was working on weight loss but exercise limited by back pain, right knee pain, hernia.  Presents today for follow up.  Feeling overall well since last seen. Has not been checking her blood pressure at home as she is not confident her cuff is accurate. Encouraged her to bring to her next office visit to assess the accuracy.  Eating out more often than at home. States she has gotten a bit off track. Not presently exercising due to knee, back pain. Wearing her CPAP intermittently. Reports no shortness of breath nor dyspnea on exertion. Reports no chest pain, pressure, or tightness. No  orthopnea, PND. Reports mild pedal edema by end of day but it is not bothersome.  Reports no palpitations.    ROS: Please see the history of present illness.    All other systems reviewed and are negative.   Studies Reviewed: [name]   EKG Interpretation Date/Time:  Monday February 27 2023 08:03:36 EDT Ventricular Rate:  75 PR Interval:  146 QRS Duration:  92 QT Interval:  386 QTC Calculation: 431 R Axis:   36  Text Interpretation: Normal sinus rhythm Incomplete right bundle branch block Confirmed by Gillian Shields ([id number]) on 02/27/2023 8:08:49 AM    Cardiac Studies & Procedures     STRESS TESTS  MYOCARDIAL PERFUSION IMAGING 01/07/2020  Narrative  The left ventricular ejection fraction is mildly decreased (45-54%).  Nuclear stress EF: 49%.  There was no ST segment deviation noted  during stress.  This is an intermediate risk study.  Inferior hypokinesis noted. Consider echo to better assess systolic function.  There is no evidence of ischemia.   ECHOCARDIOGRAM  ECHOCARDIOGRAM COMPLETE 01/31/2020  Narrative ECHOCARDIOGRAM REPORT    Patient Name:   Michelle Huerta Reasons  Date of Exam: 01/31/2020 Medical Rec #:  [id number]     Height:       66.0 in Accession #:    [id number]    Weight:       290.0 lb Date of Birth:  Apr 30, 1961      BSA:          2.342 m Patient Age:    59 years      BP:           129/87 mmHg Patient Gender: F             HR:           77 bpm. Exam Location:  [address]  Procedure: 2D Echo, Cardiac Doppler and Color Doppler  Indications:    R07.9 Chest pain  History:        Patient has prior history of Echocardiogram examinations, most recent 06/25/2015. Risk Factors:Hypertension, Dyslipidemia and Morbid Obesity. Hypothyroidism. Murmur. Sleep apnea-CPAP. Lupus.  Sonographer:    Sedonia Small Rodgers-Jones RDCS Referring Phys: [id number] TIFFANY [place]  IMPRESSIONS   1. Left ventricular ejection fraction, by estimation, is 60 to 65%. The left ventricle has normal function. The left ventricle  has no regional wall motion abnormalities. Left ventricular diastolic parameters are consistent with Grade I diastolic dysfunction (impaired relaxation). 2. Right ventricular systolic function is normal. The right ventricular size is normal. Tricuspid regurgitation signal is inadequate for assessing PA pressure. 3. The mitral valve is normal in structure. No evidence of mitral valve regurgitation. No evidence of mitral stenosis. 4. The aortic valve is normal in structure. Aortic valve regurgitation is not visualized. No aortic stenosis is present.  FINDINGS Left Ventricle: Left ventricular ejection fraction, by estimation, is 60 to 65%. The left ventricle has normal function. The left ventricle has no regional wall motion abnormalities. The left ventricular  internal cavity size was normal in size. There is no left ventricular hypertrophy. Left ventricular diastolic parameters are consistent with Grade I diastolic dysfunction (impaired relaxation). Normal left ventricular filling pressure.  Right Ventricle: The right ventricular size is normal. No increase in right ventricular wall thickness. Right ventricular systolic function is normal. Tricuspid regurgitation signal is inadequate for assessing PA pressure.  Left Atrium: Left atrial size was normal in size.  Right Atrium: Right atrial size was normal in size.  Pericardium: There is no evidence of pericardial effusion.  Mitral Valve: The mitral valve is normal in structure. Normal mobility of the mitral valve leaflets. No evidence of mitral valve regurgitation. No evidence of mitral valve stenosis.  Tricuspid Valve: The tricuspid valve is normal in structure. Tricuspid valve regurgitation is not demonstrated. No evidence of tricuspid stenosis.  Aortic Valve: The aortic valve is normal in structure. Aortic valve regurgitation is not visualized. No aortic stenosis is present.  Pulmonic Valve: The pulmonic valve was normal in structure. Pulmonic valve regurgitation is not visualized. No evidence of pulmonic stenosis.  Aorta: The aortic root is normal in size and structure.  Venous: The inferior vena cava was not well visualized.  IAS/Shunts: No atrial level shunt detected by color flow Doppler.   LEFT VENTRICLE PLAX 2D LVIDd:         4.60 cm  Diastology LVIDs:         3.10 cm  LV e' lateral:   11.90 cm/s LV PW:         1.20 cm  LV E/e' lateral: 7.6 LV IVS:        1.00 cm  LV e' medial:    9.51 cm/s LVOT diam:     1.90 cm  LV E/e' medial:  9.5 LV SV:         64 LV SV Index:   27 LVOT Area:     2.84 cm   RIGHT VENTRICLE RV Basal diam:  3.50 cm RV S prime:     10.80 cm/s  LEFT ATRIUM             Index       RIGHT ATRIUM           Index LA diam:        3.60 cm 1.54 cm/m  RA Area:      11.30 cm LA Vol (A2C):   69.3 ml 29.59 ml/m RA Volume:   25.70 ml  10.97 ml/m LA Vol (A4C):   42.0 ml 17.93 ml/m LA Biplane Vol: 57.5 ml 24.55 ml/m AORTIC VALVE LVOT Vmax:   131.00 cm/s LVOT Vmean:  92.800 cm/s LVOT VTI:    0.226 m  AORTA Ao Root diam: 3.00 cm Ao Asc diam:  3.30 cm  MITRAL VALVE MV Area (PHT): 3.12 cm     SHUNTS MV Decel  Time: 243 msec     Systemic VTI:  0.23 m MV E velocity: 90.00 cm/s   Systemic Diam: 1.90 cm MV A velocity: 125.00 cm/s MV E/A ratio:  0.72  Armanda Magic MD Electronically signed by Armanda Magic MD Signature Date/Time: 01/31/2020/2:18:13 PM    Final             Risk Assessment/Calculations:             Physical Exam:   VS:  BP 130/82   Pulse 75   Ht 5\' 6"  (1.676 m)   Wt 281 lb 12.8 oz (127.8 kg)   LMP 08/01/2012   SpO2 98%   BMI 45.48 kg/m    Wt Readings from Last 3 Encounters:  02/27/23 281 lb 12.8 oz (127.8 kg)  08/25/22 273 lb (123.8 kg)  07/18/22 270 lb (122.5 kg)    GEN: Well nourished, overweight, well developed in no acute distress NECK: No JVD; No carotid bruits CARDIAC: RRR, no murmurs, rubs, gallops RESPIRATORY:  Clear to auscultation without rales, wheezing or rhonchi  ABDOMEN: Soft, non-tender, non-distended EXTREMITIES:  No edema; No deformity   ASSESSMENT AND PLAN: .    HTN- Initial 144/87 with repeat 130/82. Continue Telmisartan-hydrochlorothiazide 80-12.5mg  daily, Amlodipine 10mg  daily. Discussed to monitor BP at home at least 2 hours after medications and sitting for 5-10 minutes.   Leg cramps - One week history of worsening leg cramps. Update BMP, magnesium.   OSA on CPAP - CPAP compliance encouraged. She reports wearing "so so".   Obesity- Weight loss via diet and exercise encouraged. Discussed the impact being overweight would have on cardiovascular risk. Following with [facility]. Refer to [employer] program at [facility].       Dispo: follow up in 6 months  Signed, Alver Sorrow, NP

## 2023-02-28 LAB — BASIC METABOLIC PANEL
BUN/Creatinine Ratio: 21 (ref 12–28)
BUN: 19 mg/dL (ref 8–27)
CO2: 24 mmol/L (ref 20–29)
Calcium: 9.7 mg/dL (ref 8.7–10.3)
Chloride: 105 mmol/L (ref 96–106)
Creatinine, Ser: 0.92 mg/dL (ref 0.57–1.00)
Glucose: 95 mg/dL (ref 70–99)
Potassium: 4.9 mmol/L (ref 3.5–5.2)
Sodium: 144 mmol/L (ref 134–144)
eGFR: 71 mL/min/{1.73_m2} (ref >59)

## 2023-02-28 LAB — MAGNESIUM: Magnesium: 2.1 mg/dL (ref 1.6–2.3)

## 2023-03-03 ENCOUNTER — Telehealth: Payer: Self-pay

## 2023-03-03 NOTE — Telephone Encounter (Signed)
Asked pt to call me reference PREP.

## 2023-03-07 ENCOUNTER — Telehealth: Payer: Self-pay

## 2023-03-07 NOTE — Telephone Encounter (Signed)
Received [facility] pt yesterday returning call ref new referral to PREP.  Call to pt today. LVM requesting call back. Next PREP class at [facility] will be 05/01/23 MW 230p-345p

## 2023-03-08 DIAGNOSIS — Z03818 Encounter for observation for suspected exposure to other biological agents ruled out: Secondary | ICD-10-CM | POA: Diagnosis not present

## 2023-03-08 DIAGNOSIS — R0989 Other specified symptoms and signs involving the circulatory and respiratory systems: Secondary | ICD-10-CM | POA: Diagnosis not present

## 2023-03-08 DIAGNOSIS — B349 Viral infection, unspecified: Secondary | ICD-10-CM | POA: Diagnosis not present

## 2023-03-20 DIAGNOSIS — J069 Acute upper respiratory infection, unspecified: Secondary | ICD-10-CM | POA: Diagnosis not present

## 2023-04-14 ENCOUNTER — Telehealth: Payer: Self-pay

## 2023-04-14 NOTE — Telephone Encounter (Signed)
Call to pt to schedule intake for PREP starting on 05/01/23.  Intake scheduled fro 2pm 04/26/23 at [facility].

## 2023-04-26 NOTE — Progress Notes (Signed)
[facility] PREP Evaluation  Patient Details  Name: Michelle Huerta MRN: [id number] Date of Birth: June 16, 1961 Age: 62 y.o. PCP: Laurann Montana, MD  Vitals:   04/26/23 1405  BP: 122/80  Pulse: 93  SpO2: 99%  Weight: 289 lb (131.1 kg)     [facility] Eval - 04/26/23 1400       [facility] "PREP" Location   [facility] "PREP" Location [facility]      Referral    Referring Provider Walker    Reason for referral Hypertension;Inactivity;Obesitity/Overweight    Program Start Date 05/01/23   MW 1p-215p x 12 wks     Measurement   Waist Circumference 54 inches    Hip Circumference 55 inches    Body fat --   did not calculate (E4)     Information for Trainer   Goals Lose weight, get smaller, tone up.    Current Exercise None    Orthopedic Concerns OA bil knees, Lumbar DDD, Right shoulder pain    Pertinent Medical History HTN, Lupus, Pagets, prediabetes, OSA    Current Barriers None    Restrictions/Precautions Diabetic snack before exercise    Medications that affect exercise Medication causing dizziness/drowsiness      Timed Up and Go (TUGS)   Timed Up and Go Low risk <9 seconds   Knees occasionally give way     Mobility and Daily Activities   I find it easy to walk up or down two or more flights of stairs. 1    I have no trouble taking out the trash. 4    I do housework such as vacuuming and dusting on my own without difficulty. 4    I can easily lift a gallon of milk (8lbs). 4    I can easily walk a mile. 1    I have no trouble reaching into high cupboards or reaching down to pick up something from the floor. 2    I do not have trouble doing out-door work such as Loss adjuster, chartered, raking leaves, or gardening. 2      Mobility and Daily Activities   I feel younger than my age. 1    I feel independent. 4    I feel energetic. 2    I live an active life.  1    I feel strong. 3    I feel healthy. 2    I feel active as other people my age. 1      How fit and strong are you.   Fit and Strong Total  Score 32            Past Medical History:  Diagnosis Date   Allergic urticaria 07/02/2019   Allergy    Arthritis of both knees    Asthma    Back pain    Bilateral chronic knee pain    Chronic back pain    Constipation    DDD (degenerative disc disease), cervical    Genital herpes    HSV type 2 at rectum, culture positive 3/15   GERD (gastroesophageal reflux disease)    Glaucoma    Grade I diastolic dysfunction 06/25/2015   .noted on ECHO. , Murmur   History of colon polyps    HLD (hyperlipidemia)    Hypertension    Insomnia    Internal hemorrhoids    Lactose intolerance    Lupus (HCC) 2008   LVH (left ventricular hypertrophy) 06/25/2015   Mild, noted on ECHO    Mild sleep apnea  cpap   Morbid obesity (HCC) 06/11/2015   Osteoporosis    Plantar fasciitis, bilateral    Pre-diabetes    Pt takes metformin   Sleep apnea    Thrombocytopenia (HCC)    TR (tricuspid regurgitation) 06/25/2015   Trace, noted on ECHO    Tubular adenoma polyp of rectum    8/12, Dr Russella Dar, 5 yr follow up    Uterine fibroid 10/19/2017   noted on CT pelvis   Vitamin D deficiency    Past Surgical History:  Procedure Laterality Date   AUGMENTATION MAMMAPLASTY Bilateral 2001   BREAST REDUCTION SURGERY Bilateral 1999   CHOLECYSTECTOMY     COLONOSCOPY  01/19/2016   DILATION AND CURETTAGE OF UTERUS N/A 06/24/2015   Procedure: DILATATION AND CURETTAGE;  Surgeon: Allie Bossier, MD;  Location: [facility];  Service: Gynecology;  Laterality: N/A;   KNEE ARTHROSCOPY WITH SUBCHONDROPLASTY Right 05/03/2018   Procedure: RIGHT KNEE ARTHROSCOPY WITH DEBRIDEMENT, PARTIAL MEDIAL MENISCECTOMY SUBCHONDROPLASTY MEDIAL TIBIAL PLATEAU AND MEDIAL FEMORAL CONDYLE;  Surgeon: Kathryne Hitch, MD;  Location: [facility];  Service: Orthopedics;  Laterality: Right;   POLYPECTOMY     REDUCTION MAMMAPLASTY     TOE SURGERY     removal of bone in each foot   TUBAL LIGATION  1991   Social History   Tobacco Use  Smoking  Status Former   Current packs/day: 0.00   Types: Cigars, Cigarettes   Quit date: 08/11/2008   Years since quitting: 14.7  Smokeless Tobacco Never  Tobacco Comments   smoked Black&Milds, 1 pack per day (5 in a pack)    Pam M Corde Antonini 04/26/2023, 2:11 PM

## 2023-04-28 DIAGNOSIS — B353 Tinea pedis: Secondary | ICD-10-CM | POA: Diagnosis not present

## 2023-04-28 DIAGNOSIS — R7303 Prediabetes: Secondary | ICD-10-CM | POA: Diagnosis not present

## 2023-04-28 DIAGNOSIS — E559 Vitamin D deficiency, unspecified: Secondary | ICD-10-CM | POA: Diagnosis not present

## 2023-04-28 DIAGNOSIS — L309 Dermatitis, unspecified: Secondary | ICD-10-CM | POA: Diagnosis not present

## 2023-04-28 DIAGNOSIS — G4700 Insomnia, unspecified: Secondary | ICD-10-CM | POA: Diagnosis not present

## 2023-04-28 DIAGNOSIS — I10 Essential (primary) hypertension: Secondary | ICD-10-CM | POA: Diagnosis not present

## 2023-05-03 NOTE — Progress Notes (Signed)
[facility] PREP Weekly Session  Patient Details  Name: Michelle Huerta MRN: [id number] Date of Birth: April 09, 1961 Age: 62 y.o. PCP: Laurann Montana, MD  There were no vitals filed for this visit.   [facility] Weekly seesion - 05/03/23 1500       [facility] "PREP" Location   [facility] "PREP" Location [facility]      Weekly Session   Topic Discussed Goal setting and welcome to the program   fit testing done   Classes attended to date 2             [name] 05/03/2023, 3:15 PM

## 2023-05-04 DIAGNOSIS — M170 Bilateral primary osteoarthritis of knee: Secondary | ICD-10-CM | POA: Diagnosis not present

## 2023-05-12 DIAGNOSIS — Z23 Encounter for immunization: Secondary | ICD-10-CM | POA: Diagnosis not present

## 2023-05-22 NOTE — Progress Notes (Signed)
[facility] PREP Weekly Session  Patient Details  Name: Michelle Huerta MRN: [id number] Date of Birth: Sep 11, 1960 Age: 62 y.o. PCP: Laurann Montana, MD  Vitals:   05/22/23 1300  Weight: 291 lb (132 kg)     [facility] Weekly seesion - 05/22/23 1400       [facility] "PREP" Location   [facility]PREP" [facility]      Weekly Session   Topic Discussed Healthy eating tips    Minutes exercised this week 75 minutes    Classes attended to date 5             [name] 05/22/2023, 2:39 PM

## 2023-05-29 NOTE — Progress Notes (Signed)
[facility] PREP Weekly Session  Patient Details  Name: Michelle Huerta MRN: [id number] Date of Birth: October 14, 1960 Age: 62 y.o. PCP: Laurann Montana, MD  Vitals:   05/29/23 1442  Weight: 290 lb (131.5 kg)     [facility] Weekly seesion - 05/29/23 1400       [facility] "PREP" Location   [facility]      Weekly Session   Topic Discussed Health habits    Minutes exercised this week 50 minutes    Classes attended to date 6             [name] 05/29/2023, 2:43 PM

## 2023-05-31 DIAGNOSIS — H02886 Meibomian gland dysfunction of left eye, unspecified eyelid: Secondary | ICD-10-CM | POA: Diagnosis not present

## 2023-05-31 DIAGNOSIS — H02883 Meibomian gland dysfunction of right eye, unspecified eyelid: Secondary | ICD-10-CM | POA: Diagnosis not present

## 2023-05-31 DIAGNOSIS — H0102A Squamous blepharitis right eye, upper and lower eyelids: Secondary | ICD-10-CM | POA: Diagnosis not present

## 2023-05-31 DIAGNOSIS — H25813 Combined forms of age-related cataract, bilateral: Secondary | ICD-10-CM | POA: Diagnosis not present

## 2023-05-31 DIAGNOSIS — M329 Systemic lupus erythematosus, unspecified: Secondary | ICD-10-CM | POA: Diagnosis not present

## 2023-05-31 DIAGNOSIS — H0102B Squamous blepharitis left eye, upper and lower eyelids: Secondary | ICD-10-CM | POA: Diagnosis not present

## 2023-06-19 NOTE — Progress Notes (Signed)
[facility] PREP Weekly Session  Patient Details  Name: Michelle Huerta MRN: [id number] Date of Birth: 1961-04-19 Age: 62 y.o. PCP: Laurann Montana, MD  Vitals:   06/19/23 1452  Weight: 291 lb (132 kg)     [facility] Weekly seesion - 06/19/23 1400       [facility] "PREP" Location   [facility]      Weekly Session   Topic Discussed Expectations and non-scale victories    Minutes exercised this week 85 minutes    Classes attended to date 63             Pam Jerral Bonito 06/19/2023, 2:53 PM

## 2023-06-28 DIAGNOSIS — R350 Frequency of micturition: Secondary | ICD-10-CM | POA: Diagnosis not present

## 2023-06-28 DIAGNOSIS — R8271 Bacteriuria: Secondary | ICD-10-CM | POA: Diagnosis not present

## 2023-07-10 NOTE — Progress Notes (Signed)
[facility] PREP Weekly Session  Patient Details  Name: Michelle Huerta MRN: [id number] Date of Birth: 06/11/1961 Age: 62 y.o. PCP: Laurann Montana, MD  Vitals:   07/10/23 1427  Weight: 291 lb 12.8 oz (132.4 kg)     [facility] Weekly seesion - 07/10/23 1400       [facility] "PREP" Location   [facility] "PREP" Location [facility]      Weekly Session   Topic Discussed Finding support    Classes attended to date 12             [name] 07/10/2023, 2:27 PM

## 2023-08-01 ENCOUNTER — Other Ambulatory Visit (HOSPITAL_COMMUNITY): Payer: Self-pay

## 2023-08-01 DIAGNOSIS — L930 Discoid lupus erythematosus: Secondary | ICD-10-CM | POA: Diagnosis not present

## 2023-08-01 DIAGNOSIS — M1990 Unspecified osteoarthritis, unspecified site: Secondary | ICD-10-CM | POA: Diagnosis not present

## 2023-08-01 DIAGNOSIS — M549 Dorsalgia, unspecified: Secondary | ICD-10-CM | POA: Diagnosis not present

## 2023-08-01 DIAGNOSIS — M889 Osteitis deformans of unspecified bone: Secondary | ICD-10-CM | POA: Diagnosis not present

## 2023-08-01 DIAGNOSIS — M25569 Pain in unspecified knee: Secondary | ICD-10-CM | POA: Diagnosis not present

## 2023-08-01 DIAGNOSIS — M170 Bilateral primary osteoarthritis of knee: Secondary | ICD-10-CM | POA: Diagnosis not present

## 2023-08-01 MED ORDER — GABAPENTIN 100 MG PO CAPS
100.0000 mg | ORAL_CAPSULE | Freq: Every day | ORAL | 3 refills | Status: AC
Start: 2023-08-01 — End: ?
  Filled 2023-08-01: qty 30, 30d supply, fill #0

## 2023-08-03 DIAGNOSIS — M1811 Unilateral primary osteoarthritis of first carpometacarpal joint, right hand: Secondary | ICD-10-CM | POA: Diagnosis not present

## 2023-08-09 ENCOUNTER — Other Ambulatory Visit (HOSPITAL_COMMUNITY): Payer: Self-pay

## 2023-08-09 ENCOUNTER — Encounter: Payer: Self-pay | Admitting: *Deleted

## 2023-08-09 DIAGNOSIS — G580 Intercostal neuropathy: Secondary | ICD-10-CM | POA: Diagnosis not present

## 2023-08-09 NOTE — Progress Notes (Signed)
Michelle Huerta completed 5 of 12 educational lessons and 8 of 12 exercise sessions. She did not attend final FIT testing and did not come for her final evaluation. PREP incomplete.

## 2023-08-10 ENCOUNTER — Other Ambulatory Visit: Payer: Self-pay | Admitting: Family Medicine

## 2023-08-10 DIAGNOSIS — Z1231 Encounter for screening mammogram for malignant neoplasm of breast: Secondary | ICD-10-CM

## 2023-08-10 DIAGNOSIS — M170 Bilateral primary osteoarthritis of knee: Secondary | ICD-10-CM | POA: Diagnosis not present

## 2023-08-16 DIAGNOSIS — I10 Essential (primary) hypertension: Secondary | ICD-10-CM | POA: Diagnosis not present

## 2023-08-16 DIAGNOSIS — G4733 Obstructive sleep apnea (adult) (pediatric): Secondary | ICD-10-CM | POA: Diagnosis not present

## 2023-08-16 DIAGNOSIS — G4700 Insomnia, unspecified: Secondary | ICD-10-CM | POA: Diagnosis not present

## 2023-08-16 DIAGNOSIS — J310 Chronic rhinitis: Secondary | ICD-10-CM | POA: Diagnosis not present

## 2023-08-30 ENCOUNTER — Ambulatory Visit: Payer: 59 | Admitting: Podiatry

## 2023-09-05 DIAGNOSIS — G4733 Obstructive sleep apnea (adult) (pediatric): Secondary | ICD-10-CM | POA: Diagnosis not present

## 2023-09-06 ENCOUNTER — Ambulatory Visit (INDEPENDENT_AMBULATORY_CARE_PROVIDER_SITE_OTHER): Payer: 59 | Admitting: Podiatry

## 2023-09-06 ENCOUNTER — Encounter: Payer: Self-pay | Admitting: Podiatry

## 2023-09-06 DIAGNOSIS — M79674 Pain in right toe(s): Secondary | ICD-10-CM | POA: Diagnosis not present

## 2023-09-06 DIAGNOSIS — B351 Tinea unguium: Secondary | ICD-10-CM | POA: Diagnosis not present

## 2023-09-06 DIAGNOSIS — M79675 Pain in left toe(s): Secondary | ICD-10-CM | POA: Diagnosis not present

## 2023-09-06 DIAGNOSIS — R7303 Prediabetes: Secondary | ICD-10-CM

## 2023-09-06 NOTE — Progress Notes (Signed)

## 2023-09-11 DIAGNOSIS — H25813 Combined forms of age-related cataract, bilateral: Secondary | ICD-10-CM | POA: Diagnosis not present

## 2023-09-11 DIAGNOSIS — H0102A Squamous blepharitis right eye, upper and lower eyelids: Secondary | ICD-10-CM | POA: Diagnosis not present

## 2023-09-11 DIAGNOSIS — H0102B Squamous blepharitis left eye, upper and lower eyelids: Secondary | ICD-10-CM | POA: Diagnosis not present

## 2023-09-11 DIAGNOSIS — M329 Systemic lupus erythematosus, unspecified: Secondary | ICD-10-CM | POA: Diagnosis not present

## 2023-09-12 ENCOUNTER — Ambulatory Visit
Admission: RE | Admit: 2023-09-12 | Discharge: 2023-09-12 | Disposition: A | Discharge status: Home / Self Care | Payer: Medicare Other | Source: Ambulatory Visit | Attending: Family Medicine | Admitting: Family Medicine

## 2023-09-12 DIAGNOSIS — Z1231 Encounter for screening mammogram for malignant neoplasm of breast: Secondary | ICD-10-CM | POA: Diagnosis not present

## 2023-09-16 ENCOUNTER — Other Ambulatory Visit: Payer: Self-pay | Admitting: Physician Assistant

## 2023-09-27 DIAGNOSIS — R600 Localized edema: Secondary | ICD-10-CM | POA: Diagnosis not present

## 2023-09-27 DIAGNOSIS — I10 Essential (primary) hypertension: Secondary | ICD-10-CM | POA: Diagnosis not present

## 2023-10-14 DIAGNOSIS — R079 Chest pain, unspecified: Secondary | ICD-10-CM | POA: Diagnosis not present

## 2023-10-14 DIAGNOSIS — G580 Intercostal neuropathy: Secondary | ICD-10-CM | POA: Diagnosis not present

## 2023-10-19 DIAGNOSIS — G580 Intercostal neuropathy: Secondary | ICD-10-CM | POA: Diagnosis not present

## 2023-11-09 DIAGNOSIS — M170 Bilateral primary osteoarthritis of knee: Secondary | ICD-10-CM | POA: Diagnosis not present

## 2023-11-10 DIAGNOSIS — L930 Discoid lupus erythematosus: Secondary | ICD-10-CM | POA: Diagnosis not present

## 2023-11-10 DIAGNOSIS — Z0000 Encounter for general adult medical examination without abnormal findings: Secondary | ICD-10-CM | POA: Diagnosis not present

## 2023-11-10 DIAGNOSIS — R1310 Dysphagia, unspecified: Secondary | ICD-10-CM | POA: Diagnosis not present

## 2023-11-10 DIAGNOSIS — Z23 Encounter for immunization: Secondary | ICD-10-CM | POA: Diagnosis not present

## 2023-11-10 DIAGNOSIS — K760 Fatty (change of) liver, not elsewhere classified: Secondary | ICD-10-CM | POA: Diagnosis not present

## 2023-11-10 DIAGNOSIS — R0989 Other specified symptoms and signs involving the circulatory and respiratory systems: Secondary | ICD-10-CM | POA: Diagnosis not present

## 2023-11-10 DIAGNOSIS — R7303 Prediabetes: Secondary | ICD-10-CM | POA: Diagnosis not present

## 2023-11-10 DIAGNOSIS — M889 Osteitis deformans of unspecified bone: Secondary | ICD-10-CM | POA: Diagnosis not present

## 2023-11-10 DIAGNOSIS — I10 Essential (primary) hypertension: Secondary | ICD-10-CM | POA: Diagnosis not present

## 2023-11-10 DIAGNOSIS — E559 Vitamin D deficiency, unspecified: Secondary | ICD-10-CM | POA: Diagnosis not present

## 2023-11-13 ENCOUNTER — Other Ambulatory Visit: Payer: Self-pay | Admitting: Family Medicine

## 2023-11-13 DIAGNOSIS — E2839 Other primary ovarian failure: Secondary | ICD-10-CM

## 2023-11-14 ENCOUNTER — Other Ambulatory Visit: Payer: Self-pay | Admitting: Family Medicine

## 2023-11-14 DIAGNOSIS — R0989 Other specified symptoms and signs involving the circulatory and respiratory systems: Secondary | ICD-10-CM

## 2023-11-20 ENCOUNTER — Ambulatory Visit
Admission: RE | Admit: 2023-11-20 | Discharge: 2023-11-20 | Disposition: A | Discharge status: Home / Self Care | Source: Ambulatory Visit | Attending: Family Medicine | Admitting: Family Medicine

## 2023-11-20 ENCOUNTER — Other Ambulatory Visit: Payer: Self-pay | Admitting: Family Medicine

## 2023-11-20 DIAGNOSIS — R0989 Other specified symptoms and signs involving the circulatory and respiratory systems: Secondary | ICD-10-CM

## 2023-11-20 DIAGNOSIS — M79671 Pain in right foot: Secondary | ICD-10-CM | POA: Diagnosis not present

## 2023-11-20 DIAGNOSIS — M7989 Other specified soft tissue disorders: Secondary | ICD-10-CM | POA: Diagnosis not present

## 2023-11-22 DIAGNOSIS — J301 Allergic rhinitis due to pollen: Secondary | ICD-10-CM | POA: Diagnosis not present

## 2023-11-22 DIAGNOSIS — G4733 Obstructive sleep apnea (adult) (pediatric): Secondary | ICD-10-CM | POA: Diagnosis not present

## 2023-11-22 DIAGNOSIS — G4700 Insomnia, unspecified: Secondary | ICD-10-CM | POA: Diagnosis not present

## 2023-11-24 ENCOUNTER — Ambulatory Visit: Admitting: Physician Assistant

## 2023-11-24 ENCOUNTER — Encounter: Payer: Self-pay | Admitting: Physician Assistant

## 2023-11-24 ENCOUNTER — Other Ambulatory Visit (INDEPENDENT_AMBULATORY_CARE_PROVIDER_SITE_OTHER)

## 2023-11-24 VITALS — BP 130/82 | HR 81 | Ht 67.0 in | Wt 296.0 lb

## 2023-11-24 DIAGNOSIS — L930 Discoid lupus erythematosus: Secondary | ICD-10-CM | POA: Diagnosis not present

## 2023-11-24 DIAGNOSIS — Z6841 Body Mass Index (BMI) 40.0 and over, adult: Secondary | ICD-10-CM

## 2023-11-24 DIAGNOSIS — A048 Other specified bacterial intestinal infections: Secondary | ICD-10-CM

## 2023-11-24 DIAGNOSIS — Z860101 Personal history of adenomatous and serrated colon polyps: Secondary | ICD-10-CM

## 2023-11-24 DIAGNOSIS — Z862 Personal history of diseases of the blood and blood-forming organs and certain disorders involving the immune mechanism: Secondary | ICD-10-CM | POA: Diagnosis not present

## 2023-11-24 DIAGNOSIS — Z9049 Acquired absence of other specified parts of digestive tract: Secondary | ICD-10-CM | POA: Diagnosis not present

## 2023-11-24 DIAGNOSIS — D649 Anemia, unspecified: Secondary | ICD-10-CM

## 2023-11-24 DIAGNOSIS — K760 Fatty (change of) liver, not elsewhere classified: Secondary | ICD-10-CM

## 2023-11-24 DIAGNOSIS — R09A2 Foreign body sensation, throat: Secondary | ICD-10-CM

## 2023-11-24 DIAGNOSIS — Z8619 Personal history of other infectious and parasitic diseases: Secondary | ICD-10-CM

## 2023-11-24 DIAGNOSIS — R1319 Other dysphagia: Secondary | ICD-10-CM

## 2023-11-24 LAB — CBC WITH DIFFERENTIAL/PLATELET
Basophils Absolute: 0.1 10*3/uL (ref 0.0–0.1)
Basophils Relative: 1.2 % (ref 0.0–3.0)
Eosinophils Absolute: 0.1 10*3/uL (ref 0.0–0.7)
Eosinophils Relative: 0.7 % (ref 0.0–5.0)
HCT: 37.4 % (ref 36.0–46.0)
Hemoglobin: 12.1 g/dL (ref 12.0–15.0)
Lymphocytes Relative: 33.8 % (ref 12.0–46.0)
Lymphs Abs: 2.6 10*3/uL (ref 0.7–4.0)
MCHC: 32.4 g/dL (ref 30.0–36.0)
MCV: 82.6 fl (ref 78.0–100.0)
Monocytes Absolute: 0.5 10*3/uL (ref 0.1–1.0)
Monocytes Relative: 6.3 % (ref 3.0–12.0)
Neutro Abs: 4.4 10*3/uL (ref 1.4–7.7)
Neutrophils Relative %: 58 % (ref 43.0–77.0)
Platelets: 186 10*3/uL (ref 150.0–400.0)
RBC: 4.53 Mil/uL (ref 3.87–5.11)
RDW: 16.5 % — ABNORMAL HIGH (ref 11.5–15.5)
WBC: 7.7 10*3/uL (ref 4.0–10.5)

## 2023-11-24 LAB — COMPREHENSIVE METABOLIC PANEL WITH GFR
ALT: 38 U/L — ABNORMAL HIGH (ref 0–35)
AST: 26 U/L (ref 0–37)
Albumin: 3.9 g/dL (ref 3.5–5.2)
Alkaline Phosphatase: 84 U/L (ref 39–117)
BUN: 15 mg/dL (ref 6–23)
CO2: 29 meq/L (ref 19–32)
Calcium: 9.4 mg/dL (ref 8.4–10.5)
Chloride: 102 meq/L (ref 96–112)
Creatinine, Ser: 0.89 mg/dL (ref 0.40–1.20)
GFR: 70.08 mL/min (ref >60.00)
Glucose, Bld: 93 mg/dL (ref 70–99)
Potassium: 4 meq/L (ref 3.5–5.1)
Sodium: 138 meq/L (ref 135–145)
Total Bilirubin: 0.5 mg/dL (ref 0.2–1.2)
Total Protein: 8.6 g/dL — ABNORMAL HIGH (ref 6.0–8.3)

## 2023-11-24 NOTE — Patient Instructions (Signed)
 Your provider has requested that you go to the basement level for lab work before leaving today. Press "B" on the elevator. The lab is located at the first door on the left as you exit the elevator.  Miralax  is an osmotic laxative.  It only brings more water into the stool.  This is safe to take daily.  Can take up to 17 gram of miralax  twice a day.  Mix with juice or coffee.  Start 1 capful at night for 3-4 days and reassess your response in 3-4 days.  You can increase and decrease the dose based on your response.  Remember, it can take up to 3-4 days to take effect OR for the effects to wear off.   I often pair this with benefiber in the morning to help assure the stool is not too loose.   Please take your proton pump inhibitor medication, omeprazole  DAILY  Please take this medication 30 minutes to 1 hour before meals- this makes it more effective.  Avoid spicy and acidic foods Avoid fatty foods Limit your intake of coffee, tea, alcohol, and carbonated drinks Work to maintain a healthy weight Keep the head of the bed elevated at least 3 inches with blocks or a wedge pillow if you are having any nighttime symptoms Stay upright for 2 hours after eating Avoid meals and snacks three to four hours before bedtime  Gastroparesis ZEPBOUND CAN CAUSE THIS, DAY BEFORE AND OF THE SHOT, FOLLOW THE GASTROPARESIS DIET Please do small frequent meals like 4-6 meals a day.  Eat and drink liquids at separate times.  Avoid high fiber foods, cook your vegetables, avoid high fat food.  Suggest spreading protein throughout the day (greek yogurt, glucerna, soft meat, milk, eggs) Choose soft foods that you can mash with a fork When you are more symptomatic, change to pureed foods foods and liquids.  Consider reading "Living well with Gastroparesis" by Creasie Doctor Gastroparesis is a condition in which food takes longer than normal to empty from the stomach. This condition is also known as delayed  gastric emptying. It is usually a long-term (chronic) condition. There is no cure, but there are treatments and things that you can do at home to help relieve symptoms. Treating the underlying condition that causes gastroparesis can also help relieve symptoms What are the causes? In many cases, the cause of this condition is not known. Possible causes include: A hormone (endocrine) disorder, such as hypothyroidism or diabetes. A nervous system disease, such as Parkinson's disease or multiple sclerosis. Cancer, infection, or surgery that affects the stomach or vagus nerve. The vagus nerve runs from your chest, through your neck, and to the lower part of your brain. A connective tissue disorder, such as scleroderma. Certain medicines. What increases the risk? You are more likely to develop this condition if: You have certain disorders or diseases. These may include: An endocrine disorder. An eating disorder. Amyloidosis. Scleroderma. Parkinson's disease. Multiple sclerosis. Cancer or infection of the stomach or the vagus nerve. You have had surgery on your stomach or vagus nerve. You take certain medicines. You are female. What are the signs or symptoms? Symptoms of this condition include: Feeling full after eating very little or a loss of appetite. Nausea, vomiting, or heartburn. Bloating of your abdomen. Inconsistent blood sugar (glucose) levels on blood tests. Unexplained weight loss. Acid from the stomach coming up into the esophagus (gastroesophageal reflux). Sudden tightening (spasm) of the stomach, which can be painful. Symptoms may come and go.  Some people may not notice any symptoms. How is this diagnosed? This condition is diagnosed with tests, such as: Tests that check how long it takes food to move through the stomach and intestines. These tests include: Upper gastrointestinal (GI) series. For this test, you drink a liquid that shows up well on X-rays, and then X-rays are  taken of your intestines. Gastric emptying scintigraphy. For this test, you eat food that contains a small amount of radioactive material, and then scans are taken. Wireless capsule GI monitoring system. For this test, you swallow a pill (capsule) that records information about how foods and fluid move through your stomach. Gastric manometry. For this test, a tube is passed down your throat and into your stomach to measure electrical and muscular activity. Endoscopy. For this test, a long, thin tube with a camera and light on the end is passed down your throat and into your stomach to check for problems in your stomach lining. Ultrasound. This test uses sound waves to create images of the inside of your body. This can help rule out gallbladder disease or pancreatitis as a cause of your symptoms. How is this treated? There is no cure for this condition, but treatment and home care may relieve symptoms. Treatment may include: Treating the underlying cause. Managing your symptoms by making changes to your diet and exercise habits. Taking medicines to control nausea and vomiting and to stimulate stomach muscles. Getting food through a feeding tube in the hospital. This may be done in severe cases. Having surgery to insert a device called a gastric electrical stimulator into your body. This device helps improve stomach emptying and control nausea and vomiting. Follow these instructions at home: Take over-the-counter and prescription medicines only as told by your health care provider. Follow instructions from your health care provider about eating or drinking restrictions. Your health care provider may recommend that you: Eat smaller meals more often. Eat low-fat foods. Eat low-fiber forms of high-fiber foods. For example, eat cooked vegetables instead of raw vegetables. Have only liquid foods instead of solid foods. Liquid foods are easier to digest. Drink enough fluid to keep your urine pale  yellow. Exercise as often as told by your health care provider. Keep all follow-up visits. This is important. Contact a health care provider if you: Notice that your symptoms do not improve with treatment. Have new symptoms. Get help right away if you: Have severe pain in your abdomen that does not improve with treatment. Have nausea that is severe or does not go away. Vomit every time you drink fluids. Summary Gastroparesis is a long-term (chronic) condition in which food takes longer than normal to empty from the stomach. Symptoms include nausea, vomiting, heartburn, bloating of your abdomen, and loss of appetite. Eating smaller portions, low-fat foods, and low-fiber forms of high-fiber foods may help you manage your symptoms. Get help right away if you have severe pain in your abdomen. This information is not intended to replace advice given to you by your health care provider. Make sure you discuss any questions you have with your health care provider. Document Revised: 11/04/2019 Document Reviewed: 11/04/2019 Elsevier Patient Education  2021 [employer].   Due to recent changes in healthcare laws, you may see the results of your imaging and laboratory studies on MyChart before your provider has had a chance to review them.  We understand that in some cases there may be results that are confusing or concerning to you. Not all laboratory results come back  in the same time frame and the provider may be waiting for multiple results in order to interpret others.  Please give us  48 hours in order for your provider to thoroughly review all the results before contacting the office for clarification of your results.     You have been scheduled for a Barium Esophogram at [facility] (1st floor of the hospital) on 12/12/2023 at 10:45 am. Please arrive 30 minutes prior to your appointment for registration. Make certain not to have anything to eat or drink 3 hours prior to your test. If you  need to reschedule for any reason, please contact radiology at [phone] to do so. __________________________________________________________________ A barium swallow is an examination that concentrates on views of the esophagus. This tends to be a double contrast exam (barium and two liquids which, when combined, create a gas to distend the wall of the oesophagus) or single contrast (non-ionic iodine based). The study is usually tailored to your symptoms so a good history is essential. Attention is paid during the study to the form, structure and configuration of the esophagus, looking for functional disorders (such as aspiration, dysphagia, achalasia, motility and reflux) EXAMINATION You may be asked to change into a gown, depending on the type of swallow being performed. A radiologist and radiographer will perform the procedure. The radiologist will advise you of the type of contrast selected for your procedure and direct you during the exam. You will be asked to stand, sit or lie in several different positions and to hold a small amount of fluid in your mouth before being asked to swallow while the imaging is performed .In some instances you may be asked to swallow barium coated marshmallows to assess the motility of a solid food bolus. The exam can be recorded as a digital or video fluoroscopy procedure. POST PROCEDURE It will take 1-2 days for the barium to pass through your system. To facilitate this, it is important, unless otherwise directed, to increase your fluids for the next 24-48hrs and to resume your normal diet.  This test typically takes about 30 minutes to perform. __________________________________________________________________________________

## 2023-11-24 NOTE — Progress Notes (Signed)
 11/24/2023 MARKELL NASEEM [id number] 1960/08/25  Referring provider: Victorio Grave, MD Primary GI doctor: Dr. Rosaline Coma ( Dr. Sandrea Cruel)  ASSESSMENT AND PLAN:  Globulus sensation with history GERD with history of H. Pylori , no N/V, dysphagia, melena, no aspiration, no sinus symptoms on certizine Quit smoking 2010, smoked 8-10 years, 40 oz beer 2 x a week, no NSAIDS Status post cholecystectomy EGD 2022 H. pylori gastritis Unable to tolerate quadruple therapy did CAP Flagyl  2023 Negative eradication study 04/29/2022 11/02/2021 AB US  steatosis otherwise unremarkable Last 2 month has intermittent globulus sensation, with drinking, worse with beer, not with solids She is on prilosec 40 mg 3 days a week possible from GERD versus other versus sinuses - add on prilosec daily 40 mg - get barium swallow -Cardiac CT morphology up coming  Hepatic steatosis Seen on abdominal ultrasound 11/02/2021 Remote history of elevated LFTs with ALT being elevated 38 2020 10/2021 anti-smooth muscle mitochondrial antibody negative ANA positive with titer 1 and 320, IgG 2375 Getting ready to start on GLP/zepbound  History of IDA 2022 Repeat iron and ferritin normal 10/2021 EGD colon For IDA H pylori gastritis  mucosal nodule duodenum, duodenal deformity Two 6 to 9 mm sessile serrated polyps, single nonbleeding AVM status post APC, diverticulosis and internal hemorrhoids.  Recall 5 years 03/2026 - states labs were normal with PCP, declines today  History of adenomatous colon polyps Two 6 to 9 mm sessile serrated polyps, single nonbleeding AVM status post APC, diverticulosis and internal hemorrhoids.   Recall 5 years 03/2026  Discoid lupus erythematous Found to have elevated ANA titer referred to rheumatology 2023 Follows with rheumatology  Morbid obesity  Body mass index is 46.36 kg/m.  -Patient has been advised to make an attempt to improve diet and exercise patterns to aid in weight loss. -Recommended  diet heavy in fruits and veggies and low in animal meats, cheeses, and dairy products, appropriate calorie intake  Patient Care Team: Victorio Grave, MD as PCP - General (Family Medicine) Maudine Sos, MD as PCP - Cardiology (Cardiology) Wes Hamman, MD as Attending Physician (Orthopedic Surgery) Arnie Lao, MD as Consulting Physician (Orthopedic Surgery)  HISTORY OF PRESENT ILLNESS: 63 y.o. female with a past medical history listed below presents for evaluation of globulus sensation.   Discussed the use of AI scribe software for clinical note transcription with the patient, who gave verbal consent to proceed.  History of Present Illness   ALINTA MCCLESKEY is a 63 year old female who presents with swallowing issues.  She has experienced intermittent swallowing issues for the past one and a half to two months, characterized by a sensation of a lump or something 'funny' in her upper throat. This occurs occasionally and is sometimes associated with drinks, such as beer. There is no actual difficulty swallowing, food or pills getting caught, or sensation of things going down the wrong pipe. No nausea, vomiting, or changes in bowel habits related to this issue.  She has a history of H. pylori gastritis diagnosed in 2022, which was treated after initial intolerance to therapy. A negative eradication study was performed in October 2023. She continues to experience gastroesophageal reflux disease (GERD) symptoms, particularly with certain foods, managed with omeprazole  taken on Monday, Wednesday, and Friday. Occasional heartburn occurs if she forgets to take the medication. No dark or bloody stools.  Her past medical history includes anemia in 2022, which was not treated with medication, and normal iron levels in 2023. She has  a history of smoking, having quit in 2010 after smoking for approximately 8 to 10 years. She drinks beer about twice a week, mainly during social activities like  playing cards.  She experiences occasional constipation. No abdominal pain, changes in bowel habits, or significant weight loss. She takes gabapentin  and Tylenol  occasionally for pain but avoids anti-inflammatory medications.  She has a history of irregular heartbeat and sees a cardiologist annually. No current shortness of breath or chest discomfort. She takes a generic brand of Zyrtec daily for allergies and has not had a sinus infection in years.     She  reports that she quit smoking about 15 years ago. Her smoking use included cigars and cigarettes. She has never used smokeless tobacco. She reports current alcohol use of about 5.0 standard drinks of alcohol per week. She reports that she does not use drugs.  RELEVANT GI HISTORY, IMAGING AND LABS: Results   LABS Iron: normal (2023) H. pylori eradication study: negative (04/2022)  DIAGNOSTIC EGD: AVMs, polyps (2022) Colonoscopy: AVMs, polyps (2022) EKG: PVC, otherwise regular rhythm      CBC    Component Value Date/Time   WBC 6.7 04/06/2022 1006   WBC 5.7 10/29/2021 0926   RBC 4.48 04/06/2022 1006   RBC 4.32 10/29/2021 0926   HGB 12.4 04/06/2022 1006   HCT 37.2 04/06/2022 1006   PLT 182 04/06/2022 1006   MCV 83 04/06/2022 1006   MCH 27.7 04/06/2022 1006   MCH 27.3 06/25/2021 1311   MCHC 33.3 04/06/2022 1006   MCHC 32.6 10/29/2021 0926   RDW 14.1 04/06/2022 1006   LYMPHSABS 2.4 04/06/2022 1006   MONOABS 0.4 10/29/2021 0926   EOSABS 0.1 04/06/2022 1006   BASOSABS 0.0 04/06/2022 1006   No results for input(s): "HGB" in the last 8760 hours.  CMP     Component Value Date/Time   NA 144 02/27/2023 0836   K 4.9 02/27/2023 0836   CL 105 02/27/2023 0836   CO2 24 02/27/2023 0836   GLUCOSE 95 02/27/2023 0836   GLUCOSE 88 10/29/2021 0926   BUN 19 02/27/2023 0836   CREATININE 0.92 02/27/2023 0836   CALCIUM 9.7 02/27/2023 0836   PROT 8.2 04/06/2022 1006   ALBUMIN 4.1 04/06/2022 1006   AST 24 04/06/2022 1006   ALT 30  04/06/2022 1006   ALKPHOS 105 04/06/2022 1006   BILITOT 0.6 04/06/2022 1006   GFRNONAA >60 06/25/2021 1311   GFRAA 99 05/19/2020 0822      Latest Ref Rng & Units 04/06/2022   10:06 AM 10/29/2021    9:26 AM 05/19/2020    8:22 AM  Hepatic Function  Total Protein 6.0 - 8.5 g/dL 8.2  8.1  8.2   Albumin 3.8 - 4.9 g/dL 4.1  3.8  4.3   AST 0 - 40 IU/L 24  27  20    ALT 0 - 32 IU/L 30  33  24   Alk Phosphatase 44 - 121 IU/L 105  75  99   Total Bilirubin 0.0 - 1.2 mg/dL 0.6  0.6  0.6       Current Medications:   Current Outpatient Medications (Endocrine & Metabolic):    metFORMIN  (GLUCOPHAGE ) 500 MG tablet, TAKE 1 TABLET (500 MG TOTAL) BY MOUTH 2 (TWO) TIMES DAILY WITH A MEAL.  Current Outpatient Medications (Cardiovascular):    amLODipine  (NORVASC ) 10 MG tablet, Take 10 mg by mouth daily.   telmisartan -hydrochlorothiazide (MICARDIS  HCT) 80-12.5 MG tablet, Take 1 tablet by mouth daily.  Current  Outpatient Medications (Respiratory):    albuterol  (VENTOLIN  HFA) 108 (90 Base) MCG/ACT inhaler, INHALE 2 PUFFS INTO THE LUNGS EVERY 6 HOURS AS NEEDED FOR WHEEZING OR SHORTNESS OF BREATH.   cetirizine (ZYRTEC) 10 MG tablet, Take 10 mg by mouth daily.   fluticasone  (FLONASE ) 50 MCG/ACT nasal spray, Place 2 sprays into both nostrils daily.  Current Outpatient Medications (Analgesics):    ibuprofen  (ADVIL ) 800 MG tablet, Take 800 mg by mouth every 8 (eight) hours as needed for mild pain or moderate pain.  Current Outpatient Medications (Hematological):    Cyanocobalamin (B-12) 500 MCG TABS, Take by mouth.  Current Outpatient Medications (Other):    B Complex-Biotin-FA (B-COMPLEX PO), Take by mouth.   buPROPion  ER (WELLBUTRIN  SR) 100 MG 12 hr tablet, Take 1 tablet (100 mg total) by mouth daily.   cyclobenzaprine (FLEXERIL) 10 MG tablet, Take 10 mg by mouth at bedtime as needed for muscle spasms.   docusate sodium (COLACE) 100 MG capsule, Take 100 mg by mouth daily.   gabapentin  (NEURONTIN ) 100 MG  capsule, Take 1 capsule (100 mg total) by mouth at bedtime.   Mirabegron (MYRBETRIQ PO), 1 tablet once a day   Omega-3 Fatty Acids (OMEGA-3 PO)*, Take by mouth. Omega-7   omeprazole  (PRILOSEC) 40 MG capsule, TAKE 1 CAPSULE(40 MG) BY MOUTH TWICE DAILY   Vitamin D , Ergocalciferol , (DRISDOL ) 1.25 MG (50000 UNIT) CAPS capsule, Take 1 capsule (50,000 Units total) by mouth every 7 (seven) days. * These medications belong to multiple therapeutic classes and are listed under each applicable group.  Medical History:  Past Medical History:  Diagnosis Date   Allergic urticaria 07/02/2019   Allergy     Arthritis of both knees    Asthma    Back pain    Bilateral chronic knee pain    Chronic back pain    Constipation    DDD (degenerative disc disease), cervical    Genital herpes    HSV type 2 at rectum, culture positive 3/15   GERD (gastroesophageal reflux disease)    Glaucoma    Grade I diastolic dysfunction 06/25/2015   .noted on ECHO. , Murmur   History of colon polyps    HLD (hyperlipidemia)    Hypertension    Insomnia    Internal hemorrhoids    Lactose intolerance    Lupus 2008   LVH (left ventricular hypertrophy) 06/25/2015   Mild, noted on ECHO    Mild sleep apnea    cpap   Morbid obesity (HCC) 06/11/2015   Osteoporosis    Plantar fasciitis, bilateral    Pre-diabetes    Pt takes metformin    Sleep apnea    Thrombocytopenia (HCC)    TR (tricuspid regurgitation) 06/25/2015   Trace, noted on ECHO    Tubular adenoma polyp of rectum    8/12, Dr Sandrea Cruel, 5 yr follow up    Uterine fibroid 10/19/2017   noted on CT pelvis   Vitamin D  deficiency    Allergies:  Allergies  Allergen Reactions   Naltrexone-Bupropion  Hcl Er     Other reaction(s): did not like how she felt on it   Penicillins Rash and Other (See Comments)    Has patient had a PCN reaction causing immediate rash, facial/tongue/throat swelling, SOB or lightheadedness with hypotension: Yes Has patient had a PCN reaction  causing severe rash involving mucus membranes or skin necrosis: No Has patient had a PCN reaction that required hospitalization: No Has patient had a PCN reaction occurring within the last 10 years:  No If all of the above answers are "NO", then may proceed with Cephalosporin use.    Reclast [Zoledronic Acid] Nausea And Vomiting     Surgical History:  She  has a past surgical history that includes Tubal ligation (1991); Breast reduction surgery (Bilateral, 1999); Toe Surgery; Dilation and curettage of uterus (N/A, 06/24/2015); Augmentation mammaplasty (Bilateral, 2001); Colonoscopy (01/19/2016); Knee arthroscopy with subchondroplasty (Right, 05/03/2018); Polypectomy; Reduction mammaplasty; and Cholecystectomy. Family History:  Her family history includes Allergic rhinitis in her mother; Cancer in her maternal grandmother; Heart disease in her mother; Hypertension in her father, mother, sister, son, and son; Kidney disease in her mother; Stomach cancer in her paternal grandmother.  REVIEW OF SYSTEMS  : All other systems reviewed and negative except where noted in the History of Present Illness.  PHYSICAL EXAM: BP 130/82   Pulse 81   Ht 5\' 7"  (1.702 m)   Wt 296 lb (134.3 kg)   LMP 08/01/2012   BMI 46.36 kg/m  Physical Exam   GENERAL APPEARANCE: Well nourished, in no apparent distress. HEENT: Throat with erythema, no cervical lymphadenopathy, unremarkable thyroid , sclerae anicteric, conjunctiva pink. RESPIRATORY: Respiratory effort normal, BS equal bilateral without rales, rhonchi, wheezing. CARDIO: Regular rhythm with occasional PVC, no MRGs, peripheral pulses intact. ABDOMEN: Soft, non-distended, active bowel sounds in all 4 quadrants, non-tender, no rebound, no mass appreciated. RECTAL: Declines. MUSCULOSKELETAL: Full ROM, normal gait, without edema. SKIN: Dry, intact without rashes or lesions. No jaundice. NEURO: Alert, oriented, no focal deficits. PSYCH: Cooperative, normal mood and  affect. NECK: No cervical lymphadenopathy, thyroid  normal.      Edmonia Gottron, PA-C 11:35 AM

## 2023-11-27 NOTE — Progress Notes (Signed)
 I agree with the assessment and plan as outlined by Ms. Steffanie Dunn.

## 2023-11-28 ENCOUNTER — Ambulatory Visit: Payer: Self-pay | Admitting: Physician Assistant

## 2023-11-28 DIAGNOSIS — K760 Fatty (change of) liver, not elsewhere classified: Secondary | ICD-10-CM

## 2023-11-28 DIAGNOSIS — Z8262 Family history of osteoporosis: Secondary | ICD-10-CM | POA: Diagnosis not present

## 2023-11-28 LAB — IBC + FERRITIN
Ferritin: 90.3 ng/mL (ref 10.0–291.0)
Iron: 62 ug/dL (ref 42–145)
Saturation Ratios: 18.8 % — ABNORMAL LOW (ref 20.0–50.0)
TIBC: 329 ug/dL (ref 250.0–450.0)
Transferrin: 235 mg/dL (ref 212.0–360.0)

## 2023-12-06 ENCOUNTER — Ambulatory Visit: Payer: 59 | Admitting: Podiatry

## 2023-12-12 ENCOUNTER — Ambulatory Visit (HOSPITAL_COMMUNITY)
Admission: RE | Admit: 2023-12-12 | Discharge: 2023-12-12 | Disposition: A | Discharge status: Home / Self Care | Source: Ambulatory Visit | Attending: Physician Assistant | Admitting: Physician Assistant

## 2023-12-12 ENCOUNTER — Other Ambulatory Visit (INDEPENDENT_AMBULATORY_CARE_PROVIDER_SITE_OTHER)

## 2023-12-12 DIAGNOSIS — R09A2 Foreign body sensation, throat: Secondary | ICD-10-CM | POA: Insufficient documentation

## 2023-12-12 DIAGNOSIS — K224 Dyskinesia of esophagus: Secondary | ICD-10-CM | POA: Diagnosis not present

## 2023-12-12 DIAGNOSIS — K760 Fatty (change of) liver, not elsewhere classified: Secondary | ICD-10-CM

## 2023-12-12 LAB — COMPREHENSIVE METABOLIC PANEL WITH GFR
ALT: 36 U/L — ABNORMAL HIGH (ref 0–35)
AST: 33 U/L (ref 0–37)
Albumin: 3.9 g/dL (ref 3.5–5.2)
Alkaline Phosphatase: 68 U/L (ref 39–117)
BUN: 17 mg/dL (ref 6–23)
CO2: 30 meq/L (ref 19–32)
Calcium: 9.6 mg/dL (ref 8.4–10.5)
Chloride: 102 meq/L (ref 96–112)
Creatinine, Ser: 0.97 mg/dL (ref 0.40–1.20)
GFR: 63.18 mL/min (ref >60.00)
Glucose, Bld: 91 mg/dL (ref 70–99)
Potassium: 3.9 meq/L (ref 3.5–5.1)
Sodium: 138 meq/L (ref 135–145)
Total Bilirubin: 0.4 mg/dL (ref 0.2–1.2)
Total Protein: 8.4 g/dL — ABNORMAL HIGH (ref 6.0–8.3)

## 2023-12-12 LAB — CBC WITH DIFFERENTIAL/PLATELET
Basophils Absolute: 0 10*3/uL (ref 0.0–0.1)
Basophils Relative: 0.5 % (ref 0.0–3.0)
Eosinophils Absolute: 0 10*3/uL (ref 0.0–0.7)
Eosinophils Relative: 0.7 % (ref 0.0–5.0)
HCT: 36.6 % (ref 36.0–46.0)
Hemoglobin: 11.8 g/dL — ABNORMAL LOW (ref 12.0–15.0)
Lymphocytes Relative: 35.1 % (ref 12.0–46.0)
Lymphs Abs: 2 10*3/uL (ref 0.7–4.0)
MCHC: 32.2 g/dL (ref 30.0–36.0)
MCV: 82.1 fl (ref 78.0–100.0)
Monocytes Absolute: 0.4 10*3/uL (ref 0.1–1.0)
Monocytes Relative: 6.7 % (ref 3.0–12.0)
Neutro Abs: 3.3 10*3/uL (ref 1.4–7.7)
Neutrophils Relative %: 57 % (ref 43.0–77.0)
Platelets: 171 10*3/uL (ref 150.0–400.0)
RBC: 4.46 Mil/uL (ref 3.87–5.11)
RDW: 16 % — ABNORMAL HIGH (ref 11.5–15.5)
WBC: 5.7 10*3/uL (ref 4.0–10.5)

## 2023-12-13 ENCOUNTER — Ambulatory Visit: Payer: Self-pay | Admitting: Physician Assistant

## 2023-12-13 DIAGNOSIS — G4733 Obstructive sleep apnea (adult) (pediatric): Secondary | ICD-10-CM | POA: Diagnosis not present

## 2023-12-22 DIAGNOSIS — I10 Essential (primary) hypertension: Secondary | ICD-10-CM | POA: Diagnosis not present

## 2023-12-25 ENCOUNTER — Other Ambulatory Visit: Payer: Self-pay | Admitting: Physician Assistant

## 2024-01-08 DIAGNOSIS — I10 Essential (primary) hypertension: Secondary | ICD-10-CM | POA: Diagnosis not present

## 2024-01-15 DIAGNOSIS — G4733 Obstructive sleep apnea (adult) (pediatric): Secondary | ICD-10-CM | POA: Diagnosis not present

## 2024-01-15 DIAGNOSIS — M79651 Pain in right thigh: Secondary | ICD-10-CM | POA: Diagnosis not present

## 2024-01-15 DIAGNOSIS — E559 Vitamin D deficiency, unspecified: Secondary | ICD-10-CM | POA: Diagnosis not present

## 2024-01-15 DIAGNOSIS — L309 Dermatitis, unspecified: Secondary | ICD-10-CM | POA: Diagnosis not present

## 2024-01-15 DIAGNOSIS — I10 Essential (primary) hypertension: Secondary | ICD-10-CM | POA: Diagnosis not present

## 2024-01-15 DIAGNOSIS — K5900 Constipation, unspecified: Secondary | ICD-10-CM | POA: Diagnosis not present

## 2024-01-20 DIAGNOSIS — I10 Essential (primary) hypertension: Secondary | ICD-10-CM | POA: Diagnosis not present

## 2024-01-29 DIAGNOSIS — M25569 Pain in unspecified knee: Secondary | ICD-10-CM | POA: Diagnosis not present

## 2024-01-29 DIAGNOSIS — M170 Bilateral primary osteoarthritis of knee: Secondary | ICD-10-CM | POA: Diagnosis not present

## 2024-01-29 DIAGNOSIS — M1990 Unspecified osteoarthritis, unspecified site: Secondary | ICD-10-CM | POA: Diagnosis not present

## 2024-01-29 DIAGNOSIS — L930 Discoid lupus erythematosus: Secondary | ICD-10-CM | POA: Diagnosis not present

## 2024-01-29 DIAGNOSIS — R748 Abnormal levels of other serum enzymes: Secondary | ICD-10-CM | POA: Diagnosis not present

## 2024-01-29 DIAGNOSIS — M889 Osteitis deformans of unspecified bone: Secondary | ICD-10-CM | POA: Diagnosis not present

## 2024-01-29 DIAGNOSIS — M549 Dorsalgia, unspecified: Secondary | ICD-10-CM | POA: Diagnosis not present

## 2024-02-06 DIAGNOSIS — M170 Bilateral primary osteoarthritis of knee: Secondary | ICD-10-CM | POA: Diagnosis not present

## 2024-02-08 DIAGNOSIS — G4733 Obstructive sleep apnea (adult) (pediatric): Secondary | ICD-10-CM | POA: Diagnosis not present

## 2024-02-08 DIAGNOSIS — G588 Other specified mononeuropathies: Secondary | ICD-10-CM | POA: Diagnosis not present

## 2024-02-08 DIAGNOSIS — I10 Essential (primary) hypertension: Secondary | ICD-10-CM | POA: Diagnosis not present

## 2024-02-09 DIAGNOSIS — G4733 Obstructive sleep apnea (adult) (pediatric): Secondary | ICD-10-CM | POA: Diagnosis not present

## 2024-02-19 DIAGNOSIS — I10 Essential (primary) hypertension: Secondary | ICD-10-CM | POA: Diagnosis not present

## 2024-02-20 DIAGNOSIS — R8271 Bacteriuria: Secondary | ICD-10-CM | POA: Diagnosis not present

## 2024-02-20 DIAGNOSIS — R350 Frequency of micturition: Secondary | ICD-10-CM | POA: Diagnosis not present

## 2024-03-13 DIAGNOSIS — G4733 Obstructive sleep apnea (adult) (pediatric): Secondary | ICD-10-CM | POA: Diagnosis not present

## 2024-03-14 ENCOUNTER — Encounter: Payer: Self-pay | Admitting: *Deleted

## 2024-03-14 DIAGNOSIS — G4733 Obstructive sleep apnea (adult) (pediatric): Secondary | ICD-10-CM | POA: Diagnosis not present

## 2024-03-18 ENCOUNTER — Encounter (HOSPITAL_BASED_OUTPATIENT_CLINIC_OR_DEPARTMENT_OTHER): Payer: Self-pay | Admitting: Cardiovascular Disease

## 2024-03-18 ENCOUNTER — Ambulatory Visit (INDEPENDENT_AMBULATORY_CARE_PROVIDER_SITE_OTHER): Admitting: Cardiovascular Disease

## 2024-03-18 VITALS — BP 116/70 | HR 75 | Ht 67.0 in | Wt 282.0 lb

## 2024-03-18 DIAGNOSIS — G4733 Obstructive sleep apnea (adult) (pediatric): Secondary | ICD-10-CM | POA: Diagnosis not present

## 2024-03-18 DIAGNOSIS — I10 Essential (primary) hypertension: Secondary | ICD-10-CM | POA: Diagnosis not present

## 2024-03-18 LAB — LIPID PANEL
Chol/HDL Ratio: 1.8 ratio (ref 0.0–4.4)
Cholesterol, Total: 157 mg/dL (ref 100–199)
HDL: 86 mg/dL (ref >39)
LDL Chol Calc (NIH): 54 mg/dL (ref 0–99)
Triglycerides: 93 mg/dL (ref 0–149)
VLDL Cholesterol Cal: 17 mg/dL (ref 5–40)

## 2024-03-18 LAB — COMPREHENSIVE METABOLIC PANEL WITH GFR
ALT: 28 IU/L (ref 0–32)
AST: 25 IU/L (ref 0–40)
Albumin: 4.1 g/dL (ref 3.9–4.9)
Alkaline Phosphatase: 91 IU/L (ref 44–121)
BUN/Creatinine Ratio: 20 (ref 12–28)
BUN: 18 mg/dL (ref 8–27)
Bilirubin Total: 0.6 mg/dL (ref 0.0–1.2)
CO2: 18 mmol/L — ABNORMAL LOW (ref 20–29)
Calcium: 9.8 mg/dL (ref 8.7–10.3)
Chloride: 101 mmol/L (ref 96–106)
Creatinine, Ser: 0.91 mg/dL (ref 0.57–1.00)
Globulin, Total: 4.1 g/dL (ref 1.5–4.5)
Glucose: 84 mg/dL (ref 70–99)
Potassium: 3.7 mmol/L (ref 3.5–5.2)
Sodium: 139 mmol/L (ref 134–144)
Total Protein: 8.2 g/dL (ref 6.0–8.5)
eGFR: 72 mL/min/1.73 (ref >59)

## 2024-03-18 NOTE — Patient Instructions (Addendum)
 Medication Instructions:  Your physician recommends that you continue on your current medications as directed. Please refer to the Current Medication list given to you today.  *If you need a refill on your cardiac medications before your next appointment, please call your pharmacy*  Lab Work: LP/CMET TODAY   If you have labs (blood work) drawn today and your tests are completely normal, you will receive your results only by: MyChart Message (if you have MyChart) OR A paper copy in the mail If you have any lab test that is abnormal or we need to change your treatment, we will call you to review the results.  Testing/Procedures: NONE  Follow-Up: At [facility], you and your health needs are our priority.  As part of our continuing mission to provide you with exceptional heart care, our providers are all part of one team.  This team includes your primary Cardiologist (physician) and Advanced Practice Providers or APPs (Physician Assistants and Nurse Practitioners) who all work together to provide you with the care you need, when you need it.  Your next appointment:   1 year(s)  Provider:   Annabella Scarce, MD, Rosaline Bane, NP, or Reche Finder, NP     We recommend signing up for the patient portal called MyChart.  Sign up information is provided on this After Visit Summary.  MyChart is used to connect with patients for Virtual Visits (Telemedicine).  Patients are able to view lab/test results, encounter notes, upcoming appointments, etc.  Non-urgent messages can be sent to your provider as well.   To learn more about what you can do with MyChart, go to [email or web address].

## 2024-03-18 NOTE — Progress Notes (Signed)
 Cardiology Office Note:  .   Date:  03/18/2024  ID:  Michelle Huerta, DOB 08-21-1960, MRN [id number] PCP: Michelle Channel, MD  [facility] Providers Cardiologist:  Michelle Scarce, MD    History of Present Illness: .    Michelle Huerta is a 63 y.o. female  with hypertension, morbid obesity, OSA, discoid Lupus, and Paget's disease who presents for follow up.  She was initially seen 06/2015 for evaluation prior to starting [place].  At that appointment she reported symptoms concerning for OSA and was referred for a sleep study.  She was found to have OSA and started on a CPAP.  She was referred for an echo 06/2015 that revealed LVEF 55-60% with grade 1 diastolic dysfunction. She had a nuclear stress test 12/2019 that was negative for ischemia.   Michelle Huerta stopped working due to chronic pain.  She is on disability.  She struggles with chronic knee pain.  She has difficulty using her CPAP due to sinus drainage. She called our office 07/2021 reporting rapid heart rates but has not been seen recently. She has been seen by GI and was diagnosed with H. pylori.  At her visit 02/2022 she was working on losing weight in order to have knee surgery.  She was referred to the prep program.  She follow-up with Michelle Finder, NP 02/2023 and was feeling well.Michelle Huerta  She was using CPAP intermittently.  She was again referred to the PREP program.   Discussed the use of AI scribe software for clinical note transcription with the patient, who gave verbal consent to proceed.  History of Present Illness Michelle Huerta is a 63 year old female with obesity, knee pain, and hypertension who presents for cardiovascular follow-up and weight management.  She is currently taking Zepbound, which she started in May, and has lost approximately 17-18 pounds. She recently increased her dose to 10 mg about a month ago. Her weight loss is slow, approximately one pound per week. Her diet includes a high intake of sweets, particularly ice cream  sandwiches, though she has reduced her intake of other sweets like cookies. She finds it challenging to keep fruits fresh and has been trying to incorporate more strawberries into her diet.  Knee pain limits her ability to exercise, particularly walking. She experiences occasional shortness of breath, especially during activities such as cleaning or carrying items, which improves with rest. She uses a CPAP machine and has been tolerating it better recently, aided by gabapentin  which helps her sleep. She feels less tired in the mornings when using the CPAP.  She monitors her blood pressure regularly at home, with recent readings around 139/77 and 140/78. She takes amlodipine , telmisartan , and hydrochlorothiazide for blood pressure management. She has felt 'woozy' over the past week, attributing it to her diet rather than blood pressure issues.  She has a history of back pain and has been told she has a slipped disc and other issues, though she cannot recall all the details. She sees a rheumatologist for lupus and mentioned having patches in her back. She is under the care of Michelle Huerta [facility] for her back issues.   ROS:  As per HPI  Studies Reviewed: Michelle Huerta   EKG Interpretation Date/Time:  Monday March 18 2024 08:13:02 EDT Ventricular Rate:  75 PR Interval:  144 QRS Duration:  94 QT Interval:  382 QTC Calculation: 426 R Axis:   28  Text Interpretation: Normal sinus rhythm Normal ECG When compared with ECG of  27-Feb-2023 08:03, No significant change was found Confirmed by Huerta Michelle ([id number]) on 03/18/2024 8:15:57 AM     Risk Assessment/Calculations:             Physical Exam:   VS:  BP 116/70   Pulse 75   Ht 5' 7 (1.702 m)   Wt 282 lb (127.9 kg)   LMP 08/01/2012   BMI 44.17 kg/m  , BMI Body mass index is 44.17 kg/m. GENERAL:  Well appearing HEENT: Pupils equal round and reactive, fundi not visualized, oral mucosa unremarkable NECK:  No jugular venous distention, waveform  within normal limits, carotid upstroke brisk and symmetric, no bruits, no thyromegaly LUNGS:  Clear to auscultation bilaterally HEART:  RRR.  PMI not displaced or sustained,S1 and S2 within normal limits, no S3, no S4, no clicks, no rubs, no murmurs ABD:  Flat, positive bowel sounds normal in frequency in pitch, no bruits, no rebound, no guarding, no midline pulsatile mass, no hepatomegaly, no splenomegaly EXT:  2 plus pulses throughout, no edema, no cyanosis no clubbing SKIN:  No rashes no nodules NEURO:  Cranial nerves II through XII grossly intact, motor grossly intact throughout PSYCH:  Cognitively intact, oriented to person place and time   ASSESSMENT AND PLAN: .    Assessment & Plan # Morbid obesity due to excess calories Morbid obesity with slow weight loss progress. Currently on Zepbound, increased to 10 mg, with a weight loss of 17-18 pounds since May. Difficulty with exercise due to knee pain. Diet includes high intake of sweets, particularly ice cream sandwiches, contributing to weight issues. - Continue Zepbound at 10 mg. - Incorporate seated exercises and light weight training. - Advise reducing intake of sweets, particularly ice cream sandwiches. - Educated on storing fruits in [employer] jars to prolong freshness.  # Obstructive sleep apnea Obstructive sleep apnea with improved CPAP compliance. Gabapentin  aids in sleep, improving CPAP tolerance. Notable difference in morning fatigue when CPAP is not used.  # Essential hypertension Essential hypertension well-controlled with amlodipine , telmisartan , and hydrochlorothiazide. Blood pressure generally around 120/80 mmHg. Recent slight elevation likely due to timing of medication intake. - Check lipid panel and comprehensive metabolic panel. - Continue current antihypertensive regimen.      Dispo: f/u 1 year  Signed, Michelle Raford, MD

## 2024-03-20 ENCOUNTER — Ambulatory Visit: Admitting: Podiatry

## 2024-03-20 DIAGNOSIS — I10 Essential (primary) hypertension: Secondary | ICD-10-CM | POA: Diagnosis not present

## 2024-04-01 DIAGNOSIS — R300 Dysuria: Secondary | ICD-10-CM | POA: Diagnosis not present

## 2024-04-02 ENCOUNTER — Ambulatory Visit: Payer: Self-pay | Admitting: Cardiovascular Disease

## 2024-04-03 DIAGNOSIS — R350 Frequency of micturition: Secondary | ICD-10-CM | POA: Diagnosis not present

## 2024-04-09 DIAGNOSIS — I10 Essential (primary) hypertension: Secondary | ICD-10-CM | POA: Diagnosis not present

## 2024-04-15 DIAGNOSIS — G588 Other specified mononeuropathies: Secondary | ICD-10-CM | POA: Diagnosis not present

## 2024-04-15 DIAGNOSIS — M546 Pain in thoracic spine: Secondary | ICD-10-CM | POA: Diagnosis not present

## 2024-04-17 DIAGNOSIS — E559 Vitamin D deficiency, unspecified: Secondary | ICD-10-CM | POA: Diagnosis not present

## 2024-04-17 DIAGNOSIS — G4733 Obstructive sleep apnea (adult) (pediatric): Secondary | ICD-10-CM | POA: Diagnosis not present

## 2024-04-17 DIAGNOSIS — R7303 Prediabetes: Secondary | ICD-10-CM | POA: Diagnosis not present

## 2024-04-17 DIAGNOSIS — B372 Candidiasis of skin and nail: Secondary | ICD-10-CM | POA: Diagnosis not present

## 2024-04-17 DIAGNOSIS — I10 Essential (primary) hypertension: Secondary | ICD-10-CM | POA: Diagnosis not present

## 2024-04-17 DIAGNOSIS — Z23 Encounter for immunization: Secondary | ICD-10-CM | POA: Diagnosis not present

## 2024-04-19 DIAGNOSIS — I10 Essential (primary) hypertension: Secondary | ICD-10-CM | POA: Diagnosis not present

## 2024-04-22 DIAGNOSIS — M1990 Unspecified osteoarthritis, unspecified site: Secondary | ICD-10-CM | POA: Diagnosis not present

## 2024-04-22 DIAGNOSIS — M170 Bilateral primary osteoarthritis of knee: Secondary | ICD-10-CM | POA: Diagnosis not present

## 2024-04-22 DIAGNOSIS — R748 Abnormal levels of other serum enzymes: Secondary | ICD-10-CM | POA: Diagnosis not present

## 2024-04-22 DIAGNOSIS — L930 Discoid lupus erythematosus: Secondary | ICD-10-CM | POA: Diagnosis not present

## 2024-04-22 DIAGNOSIS — H04129 Dry eye syndrome of unspecified lacrimal gland: Secondary | ICD-10-CM | POA: Diagnosis not present

## 2024-04-22 DIAGNOSIS — M889 Osteitis deformans of unspecified bone: Secondary | ICD-10-CM | POA: Diagnosis not present

## 2024-04-22 DIAGNOSIS — M3500 Sicca syndrome, unspecified: Secondary | ICD-10-CM | POA: Diagnosis not present

## 2024-04-22 DIAGNOSIS — M549 Dorsalgia, unspecified: Secondary | ICD-10-CM | POA: Diagnosis not present

## 2024-04-22 DIAGNOSIS — M25569 Pain in unspecified knee: Secondary | ICD-10-CM | POA: Diagnosis not present

## 2024-04-30 DIAGNOSIS — R350 Frequency of micturition: Secondary | ICD-10-CM | POA: Diagnosis not present

## 2024-05-07 DIAGNOSIS — M546 Pain in thoracic spine: Secondary | ICD-10-CM | POA: Diagnosis not present

## 2024-05-08 DIAGNOSIS — H1011 Acute atopic conjunctivitis, right eye: Secondary | ICD-10-CM | POA: Diagnosis not present

## 2024-05-08 DIAGNOSIS — R350 Frequency of micturition: Secondary | ICD-10-CM | POA: Diagnosis not present

## 2024-05-10 DIAGNOSIS — I10 Essential (primary) hypertension: Secondary | ICD-10-CM | POA: Diagnosis not present

## 2024-05-11 DIAGNOSIS — G4733 Obstructive sleep apnea (adult) (pediatric): Secondary | ICD-10-CM | POA: Diagnosis not present

## 2024-06-19 ENCOUNTER — Encounter: Payer: Self-pay | Admitting: Podiatry

## 2024-06-19 ENCOUNTER — Ambulatory Visit: Admitting: Podiatry

## 2024-06-19 DIAGNOSIS — B351 Tinea unguium: Secondary | ICD-10-CM

## 2024-06-19 DIAGNOSIS — B353 Tinea pedis: Secondary | ICD-10-CM | POA: Diagnosis not present

## 2024-06-19 DIAGNOSIS — M79675 Pain in left toe(s): Secondary | ICD-10-CM | POA: Diagnosis not present

## 2024-06-19 DIAGNOSIS — M79674 Pain in right toe(s): Secondary | ICD-10-CM | POA: Diagnosis not present

## 2024-06-19 MED ORDER — CLOTRIMAZOLE-BETAMETHASONE 1-0.05 % EX CREA: TOPICAL_CREAM | CUTANEOUS | 0 refills | Status: AC

## 2024-06-25 NOTE — Progress Notes (Unsigned)
°  Subjective:  Patient ID: Michelle Huerta, female    DOB: [birth year],  MRN: [id number]  Michelle Huerta presents to clinic today for painful mycotic toenails of both feet that are difficult to trim. Pain interferes with daily activities and wearing enclosed shoe gear comfortably.  Chief Complaint  Patient presents with   RFC    Rm17 Routine foot care/ Dr. Montie Pizza last visit November 2025   New problem(s): None.   PCP is Pizza Montie, MD.  Allergies[1]  Review of Systems: Negative except as noted in the HPI.  Objective: No changes noted in today's physical examination. There were no vitals filed for this visit. Michelle Huerta is a pleasant 63 y.o. female in NAD. AAO x 3.  Vascular Examination: Capillary refill time immediate b/l. Vascular status intact b/l with palpable pedal pulses. Pedal hair present b/l. No edema. No pain with calf compression b/l. Skin temperature gradient WNL b/l. No cyanosis or clubbing noted b/l LE.  Neurological Examination: Sensation grossly intact b/l with 10 gram monofilament. Vibratory sensation intact b/l.   Dermatological Examination: Pedal skin with normal turgor, texture and tone b/l. Toenails 1-5 b/l thick, discolored, elongated with subungual debris and pain on dorsal palpation. No open wounds b/l LE. No interdigital macerations noted b/l LE.  Diffuse scaling noted peripherally and plantarly b/l feet.  No interdigital macerations.  No blisters, no weeping. No signs of secondary bacterial infection noted.  Musculoskeletal Examination: Normal muscle strength 5/5 to all lower extremity muscle groups bilaterally. Hammertoe(s) noted to the bilateral 2nd toes.. No pain, crepitus or joint limitation noted with ROM b/l LE.  Patient ambulates independently without assistive aids.  Radiographs: None  Assessment/Plan: 1. Pain due to onychomycosis of toenails of both feet   2. Tinea pedis of both feet     Meds ordered this encounter  Medications    clotrimazole -betamethasone  (LOTRISONE ) cream    Sig: Apply to both feet and between toes twice daily x 6 weeks.    Dispense:  45 g    Refill:  0  {[id number]::-Patient/POA to call should there be question/concern in the interim.}   Return in about 3 months (around 09/17/2024).  Delon LITTIE Merlin, DPM      [place] LOCATION: [address] [place] [address]                   Office [phone]   [place] LOCATION: [address] [place], [place] [address] Office [phone]     [1]  Allergies Allergen Reactions   Naltrexone-Bupropion  Hcl Er     Other reaction(s): did not like how she felt on it   Penicillins Rash and Other (See Comments)    Has patient had a PCN reaction causing immediate rash, facial/tongue/throat swelling, SOB or lightheadedness with hypotension: Yes Has patient had a PCN reaction causing severe rash involving mucus membranes or skin necrosis: No Has patient had a PCN reaction that required hospitalization: No Has patient had a PCN reaction occurring within the last 10 years: No If all of the above answers are NO, then may proceed with Cephalosporin use.    Reclast [Zoledronic Acid] Nausea And Vomiting

## 2024-10-02 ENCOUNTER — Ambulatory Visit: Admitting: Podiatry

## 2024-12-16 ENCOUNTER — Ambulatory Visit: Admitting: Dermatology
